# Patient Record
Sex: Female | Born: 1965
Health system: Southern US, Community
[De-identification: ages and names within clinical notes are randomized; demographics above are authoritative.]

## PROBLEM LIST (undated history)

## (undated) DIAGNOSIS — G4733 Obstructive sleep apnea (adult) (pediatric): Secondary | ICD-10-CM

## (undated) DIAGNOSIS — N3010 Interstitial cystitis (chronic) without hematuria: Secondary | ICD-10-CM

## (undated) DIAGNOSIS — N83201 Unspecified ovarian cyst, right side: Secondary | ICD-10-CM

## (undated) DIAGNOSIS — D649 Anemia, unspecified: Secondary | ICD-10-CM

## (undated) DIAGNOSIS — R030 Elevated blood-pressure reading, without diagnosis of hypertension: Secondary | ICD-10-CM

## (undated) DIAGNOSIS — N809 Endometriosis, unspecified: Secondary | ICD-10-CM

## (undated) DIAGNOSIS — E079 Disorder of thyroid, unspecified: Secondary | ICD-10-CM

## (undated) DIAGNOSIS — N920 Excessive and frequent menstruation with regular cycle: Secondary | ICD-10-CM

## (undated) DIAGNOSIS — D219 Benign neoplasm of connective and other soft tissue, unspecified: Secondary | ICD-10-CM

## (undated) DIAGNOSIS — E0500 Thyrotoxicosis with diffuse goiter without thyrotoxic crisis or storm: Secondary | ICD-10-CM

## (undated) DIAGNOSIS — Z634 Disappearance and death of family member: Secondary | ICD-10-CM

## (undated) DIAGNOSIS — F32A Depression, unspecified: Secondary | ICD-10-CM

## (undated) DIAGNOSIS — F329 Major depressive disorder, single episode, unspecified: Secondary | ICD-10-CM

## (undated) DIAGNOSIS — N939 Abnormal uterine and vaginal bleeding, unspecified: Secondary | ICD-10-CM

## (undated) DIAGNOSIS — F4321 Adjustment disorder with depressed mood: Secondary | ICD-10-CM

## (undated) DIAGNOSIS — E039 Hypothyroidism, unspecified: Secondary | ICD-10-CM

## (undated) DIAGNOSIS — D259 Leiomyoma of uterus, unspecified: Secondary | ICD-10-CM

## (undated) DIAGNOSIS — K219 Gastro-esophageal reflux disease without esophagitis: Secondary | ICD-10-CM

## (undated) DIAGNOSIS — E119 Type 2 diabetes mellitus without complications: Secondary | ICD-10-CM

## (undated) DIAGNOSIS — B370 Candidal stomatitis: Secondary | ICD-10-CM

## (undated) DIAGNOSIS — I493 Ventricular premature depolarization: Secondary | ICD-10-CM

## (undated) DIAGNOSIS — Z8632 Personal history of gestational diabetes: Secondary | ICD-10-CM

## (undated) DIAGNOSIS — I10 Essential (primary) hypertension: Secondary | ICD-10-CM

## (undated) HISTORY — PX: HERNIA REPAIR: SHX51

## (undated) HISTORY — DX: Thyrotoxicosis with diffuse goiter without thyrotoxic crisis or storm: E05.00

## (undated) HISTORY — DX: Excessive and frequent menstruation with regular cycle: N92.0

## (undated) HISTORY — DX: Elevated blood-pressure reading, without diagnosis of hypertension: R03.0

## (undated) HISTORY — DX: Leiomyoma of uterus, unspecified: D25.9

## (undated) HISTORY — DX: Endometriosis, unspecified: N80.9

## (undated) HISTORY — DX: Unspecified ovarian cyst, right side: N83.201

## (undated) HISTORY — PX: CHOLECYSTECTOMY: SHX55

## (undated) HISTORY — DX: Interstitial cystitis (chronic) without hematuria: N30.10

## (undated) HISTORY — DX: Hypothyroidism, unspecified: E03.9

## (undated) HISTORY — DX: Ventricular premature depolarization: I49.3

## (undated) HISTORY — DX: Abnormal uterine and vaginal bleeding, unspecified: N93.9

## (undated) HISTORY — DX: Personal history of gestational diabetes: Z86.32

## (undated) HISTORY — PX: TUMOR REMOVAL: SHX12

## (undated) HISTORY — DX: Adjustment disorder with depressed mood: Z63.4

## (undated) HISTORY — DX: Obstructive sleep apnea (adult) (pediatric): G47.33

## (undated) HISTORY — PX: APPENDECTOMY: SHX54

## (undated) HISTORY — DX: Adjustment disorder with depressed mood: F43.21

## (undated) HISTORY — DX: Candidal stomatitis: B37.0

---

## 1997-11-03 ENCOUNTER — Other Ambulatory Visit: Admission: RE | Admit: 1997-11-03 | Discharge: 1997-11-03 | Payer: Self-pay | Admitting: Obstetrics and Gynecology

## 1998-06-02 ENCOUNTER — Inpatient Hospital Stay (HOSPITAL_COMMUNITY): Admission: AD | Admit: 1998-06-02 | Discharge: 1998-06-04 | Payer: Self-pay | Admitting: Obstetrics and Gynecology

## 1998-07-08 ENCOUNTER — Emergency Department (HOSPITAL_COMMUNITY): Admission: EM | Admit: 1998-07-08 | Discharge: 1998-07-08 | Payer: Self-pay | Admitting: Emergency Medicine

## 1998-07-24 ENCOUNTER — Inpatient Hospital Stay (HOSPITAL_COMMUNITY): Admission: AD | Admit: 1998-07-24 | Discharge: 1998-07-24 | Payer: Self-pay | Admitting: Obstetrics and Gynecology

## 1998-09-01 ENCOUNTER — Encounter: Payer: Self-pay | Admitting: Emergency Medicine

## 1998-09-01 ENCOUNTER — Emergency Department (HOSPITAL_COMMUNITY): Admission: EM | Admit: 1998-09-01 | Discharge: 1998-09-01 | Payer: Self-pay | Admitting: Emergency Medicine

## 1998-10-25 ENCOUNTER — Inpatient Hospital Stay (HOSPITAL_COMMUNITY): Admission: RE | Admit: 1998-10-25 | Discharge: 1998-10-27 | Payer: Self-pay

## 1999-02-06 ENCOUNTER — Ambulatory Visit (HOSPITAL_COMMUNITY): Admission: RE | Admit: 1999-02-06 | Discharge: 1999-02-06 | Payer: Self-pay

## 1999-06-09 ENCOUNTER — Encounter: Payer: Self-pay | Admitting: Obstetrics and Gynecology

## 1999-06-09 ENCOUNTER — Ambulatory Visit (HOSPITAL_COMMUNITY): Admission: RE | Admit: 1999-06-09 | Discharge: 1999-06-09 | Payer: Self-pay | Admitting: Obstetrics and Gynecology

## 1999-06-12 ENCOUNTER — Encounter: Admission: RE | Admit: 1999-06-12 | Discharge: 1999-09-10 | Payer: Self-pay | Admitting: Obstetrics and Gynecology

## 1999-08-01 ENCOUNTER — Ambulatory Visit (HOSPITAL_COMMUNITY): Admission: RE | Admit: 1999-08-01 | Discharge: 1999-08-01 | Payer: Self-pay | Admitting: Obstetrics and Gynecology

## 1999-08-01 ENCOUNTER — Encounter: Payer: Self-pay | Admitting: Obstetrics and Gynecology

## 1999-08-17 ENCOUNTER — Inpatient Hospital Stay (HOSPITAL_COMMUNITY): Admission: AD | Admit: 1999-08-17 | Discharge: 1999-08-17 | Payer: Self-pay | Admitting: Obstetrics and Gynecology

## 1999-10-13 ENCOUNTER — Inpatient Hospital Stay (HOSPITAL_COMMUNITY): Admission: AD | Admit: 1999-10-13 | Discharge: 1999-10-13 | Payer: Self-pay | Admitting: Obstetrics and Gynecology

## 1999-10-19 ENCOUNTER — Inpatient Hospital Stay (HOSPITAL_COMMUNITY): Admission: AD | Admit: 1999-10-19 | Discharge: 1999-10-21 | Payer: Self-pay | Admitting: Obstetrics and Gynecology

## 1999-10-27 ENCOUNTER — Inpatient Hospital Stay (HOSPITAL_COMMUNITY): Admission: AD | Admit: 1999-10-27 | Discharge: 1999-10-27 | Payer: Self-pay | Admitting: *Deleted

## 2000-04-18 ENCOUNTER — Emergency Department (HOSPITAL_COMMUNITY): Admission: EM | Admit: 2000-04-18 | Discharge: 2000-04-19 | Payer: Self-pay

## 2000-04-19 ENCOUNTER — Encounter: Payer: Self-pay | Admitting: Emergency Medicine

## 2000-06-27 ENCOUNTER — Other Ambulatory Visit: Admission: RE | Admit: 2000-06-27 | Discharge: 2000-06-27 | Payer: Self-pay | Admitting: Obstetrics and Gynecology

## 2000-07-26 ENCOUNTER — Ambulatory Visit (HOSPITAL_COMMUNITY): Admission: RE | Admit: 2000-07-26 | Discharge: 2000-07-26 | Payer: Self-pay | Admitting: Obstetrics and Gynecology

## 2000-07-26 ENCOUNTER — Encounter: Payer: Self-pay | Admitting: Obstetrics and Gynecology

## 2000-08-29 ENCOUNTER — Inpatient Hospital Stay (HOSPITAL_COMMUNITY): Admission: AD | Admit: 2000-08-29 | Discharge: 2000-08-29 | Payer: Self-pay | Admitting: Obstetrics and Gynecology

## 2000-08-30 ENCOUNTER — Emergency Department (HOSPITAL_COMMUNITY): Admission: EM | Admit: 2000-08-30 | Discharge: 2000-08-30 | Payer: Self-pay | Admitting: Emergency Medicine

## 2000-09-26 ENCOUNTER — Ambulatory Visit (HOSPITAL_COMMUNITY): Admission: RE | Admit: 2000-09-26 | Discharge: 2000-09-26 | Payer: Self-pay | Admitting: Obstetrics and Gynecology

## 2000-09-26 ENCOUNTER — Encounter: Payer: Self-pay | Admitting: Obstetrics and Gynecology

## 2000-10-24 ENCOUNTER — Ambulatory Visit (HOSPITAL_COMMUNITY): Admission: RE | Admit: 2000-10-24 | Discharge: 2000-10-24 | Payer: Self-pay | Admitting: Obstetrics and Gynecology

## 2000-10-24 ENCOUNTER — Encounter: Payer: Self-pay | Admitting: Obstetrics and Gynecology

## 2000-11-27 ENCOUNTER — Ambulatory Visit (HOSPITAL_COMMUNITY): Admission: RE | Admit: 2000-11-27 | Discharge: 2000-11-27 | Payer: Self-pay | Admitting: Obstetrics and Gynecology

## 2000-11-27 ENCOUNTER — Encounter: Payer: Self-pay | Admitting: Obstetrics and Gynecology

## 2000-12-24 ENCOUNTER — Encounter (INDEPENDENT_AMBULATORY_CARE_PROVIDER_SITE_OTHER): Payer: Self-pay | Admitting: *Deleted

## 2000-12-24 ENCOUNTER — Inpatient Hospital Stay (HOSPITAL_COMMUNITY): Admission: AD | Admit: 2000-12-24 | Discharge: 2000-12-27 | Payer: Self-pay | Admitting: Obstetrics and Gynecology

## 2000-12-28 ENCOUNTER — Encounter: Admission: RE | Admit: 2000-12-28 | Discharge: 2001-01-27 | Payer: Self-pay | Admitting: Obstetrics and Gynecology

## 2000-12-29 ENCOUNTER — Inpatient Hospital Stay (HOSPITAL_COMMUNITY): Admission: AD | Admit: 2000-12-29 | Discharge: 2000-12-29 | Payer: Self-pay | Admitting: Obstetrics and Gynecology

## 2000-12-30 ENCOUNTER — Inpatient Hospital Stay (HOSPITAL_COMMUNITY): Admission: AD | Admit: 2000-12-30 | Discharge: 2000-12-30 | Payer: Self-pay | Admitting: Obstetrics and Gynecology

## 2002-10-12 ENCOUNTER — Encounter (INDEPENDENT_AMBULATORY_CARE_PROVIDER_SITE_OTHER): Payer: Self-pay

## 2002-10-12 ENCOUNTER — Encounter: Payer: Self-pay | Admitting: Emergency Medicine

## 2002-10-12 ENCOUNTER — Inpatient Hospital Stay (HOSPITAL_COMMUNITY): Admission: EM | Admit: 2002-10-12 | Discharge: 2002-10-13 | Payer: Self-pay | Admitting: Emergency Medicine

## 2004-04-29 ENCOUNTER — Emergency Department (HOSPITAL_COMMUNITY): Admission: EM | Admit: 2004-04-29 | Discharge: 2004-04-29 | Payer: Self-pay | Admitting: Emergency Medicine

## 2004-07-03 ENCOUNTER — Other Ambulatory Visit: Admission: RE | Admit: 2004-07-03 | Discharge: 2004-07-03 | Payer: Self-pay | Admitting: Obstetrics and Gynecology

## 2004-11-13 ENCOUNTER — Ambulatory Visit (HOSPITAL_COMMUNITY): Admission: RE | Admit: 2004-11-13 | Discharge: 2004-11-13 | Payer: Self-pay | Admitting: Obstetrics and Gynecology

## 2004-11-30 ENCOUNTER — Ambulatory Visit (HOSPITAL_COMMUNITY): Admission: RE | Admit: 2004-11-30 | Discharge: 2004-11-30 | Payer: Self-pay | Admitting: Obstetrics and Gynecology

## 2005-02-09 ENCOUNTER — Ambulatory Visit (HOSPITAL_COMMUNITY): Admission: RE | Admit: 2005-02-09 | Discharge: 2005-02-09 | Payer: Self-pay | Admitting: Obstetrics and Gynecology

## 2005-04-10 ENCOUNTER — Inpatient Hospital Stay (HOSPITAL_COMMUNITY): Admission: RE | Admit: 2005-04-10 | Discharge: 2005-04-13 | Payer: Self-pay | Admitting: Obstetrics and Gynecology

## 2005-04-24 ENCOUNTER — Inpatient Hospital Stay (HOSPITAL_COMMUNITY): Admission: AD | Admit: 2005-04-24 | Discharge: 2005-04-24 | Payer: Self-pay | Admitting: Obstetrics and Gynecology

## 2005-06-16 ENCOUNTER — Emergency Department (HOSPITAL_COMMUNITY): Admission: EM | Admit: 2005-06-16 | Discharge: 2005-06-16 | Payer: Self-pay | Admitting: Emergency Medicine

## 2005-07-25 ENCOUNTER — Emergency Department (HOSPITAL_COMMUNITY): Admission: EM | Admit: 2005-07-25 | Discharge: 2005-07-25 | Payer: Self-pay | Admitting: Emergency Medicine

## 2005-08-01 ENCOUNTER — Other Ambulatory Visit: Admission: RE | Admit: 2005-08-01 | Discharge: 2005-08-01 | Payer: Self-pay | Admitting: Obstetrics and Gynecology

## 2005-12-02 ENCOUNTER — Emergency Department (HOSPITAL_COMMUNITY): Admission: EM | Admit: 2005-12-02 | Discharge: 2005-12-02 | Payer: Self-pay | Admitting: Emergency Medicine

## 2006-05-28 ENCOUNTER — Inpatient Hospital Stay (HOSPITAL_COMMUNITY): Admission: AD | Admit: 2006-05-28 | Discharge: 2006-05-28 | Payer: Self-pay | Admitting: Obstetrics and Gynecology

## 2006-05-29 ENCOUNTER — Ambulatory Visit (HOSPITAL_COMMUNITY): Admission: RE | Admit: 2006-05-29 | Discharge: 2006-05-29 | Payer: Self-pay | Admitting: Obstetrics and Gynecology

## 2006-06-04 ENCOUNTER — Ambulatory Visit (HOSPITAL_COMMUNITY): Admission: RE | Admit: 2006-06-04 | Discharge: 2006-06-04 | Payer: Self-pay | Admitting: Obstetrics and Gynecology

## 2006-06-14 ENCOUNTER — Ambulatory Visit (HOSPITAL_COMMUNITY): Admission: RE | Admit: 2006-06-14 | Discharge: 2006-06-14 | Payer: Self-pay | Admitting: Obstetrics and Gynecology

## 2006-06-28 ENCOUNTER — Ambulatory Visit (HOSPITAL_COMMUNITY): Admission: RE | Admit: 2006-06-28 | Discharge: 2006-06-28 | Payer: Self-pay | Admitting: Obstetrics and Gynecology

## 2006-07-03 ENCOUNTER — Ambulatory Visit (HOSPITAL_COMMUNITY): Admission: RE | Admit: 2006-07-03 | Discharge: 2006-07-03 | Payer: Self-pay | Admitting: Obstetrics and Gynecology

## 2006-07-12 ENCOUNTER — Ambulatory Visit (HOSPITAL_COMMUNITY): Admission: RE | Admit: 2006-07-12 | Discharge: 2006-07-12 | Payer: Self-pay | Admitting: Obstetrics and Gynecology

## 2006-07-22 ENCOUNTER — Inpatient Hospital Stay (HOSPITAL_COMMUNITY): Admission: AD | Admit: 2006-07-22 | Discharge: 2006-07-26 | Payer: Self-pay | Admitting: Obstetrics and Gynecology

## 2006-07-22 ENCOUNTER — Encounter (INDEPENDENT_AMBULATORY_CARE_PROVIDER_SITE_OTHER): Payer: Self-pay | Admitting: *Deleted

## 2006-10-02 ENCOUNTER — Encounter (INDEPENDENT_AMBULATORY_CARE_PROVIDER_SITE_OTHER): Payer: Self-pay | Admitting: Nurse Practitioner

## 2006-10-02 LAB — CONVERTED CEMR LAB
ALT: 38 units/L
AST: 18 units/L
Albumin: 4.1 g/dL
Alkaline Phosphatase: 139 units/L
BUN: 13 mg/dL
CO2: 23 meq/L
Calcium: 9.5 mg/dL
Chloride: 106 meq/L
Creatinine, Ser: 0.72 mg/dL
Glucose, Bld: 86 mg/dL
Potassium: 4.5 meq/L
Sodium: 140 meq/L
Total Protein: 7.7 g/dL

## 2006-11-02 ENCOUNTER — Emergency Department (HOSPITAL_COMMUNITY): Admission: EM | Admit: 2006-11-02 | Discharge: 2006-11-02 | Payer: Self-pay | Admitting: Emergency Medicine

## 2007-08-07 ENCOUNTER — Ambulatory Visit: Payer: Self-pay | Admitting: Nurse Practitioner

## 2007-08-07 DIAGNOSIS — M5137 Other intervertebral disc degeneration, lumbosacral region: Secondary | ICD-10-CM | POA: Insufficient documentation

## 2007-08-07 DIAGNOSIS — I10 Essential (primary) hypertension: Secondary | ICD-10-CM | POA: Insufficient documentation

## 2007-08-07 DIAGNOSIS — J309 Allergic rhinitis, unspecified: Secondary | ICD-10-CM | POA: Insufficient documentation

## 2007-08-08 ENCOUNTER — Ambulatory Visit: Payer: Self-pay | Admitting: *Deleted

## 2007-08-12 ENCOUNTER — Encounter (INDEPENDENT_AMBULATORY_CARE_PROVIDER_SITE_OTHER): Payer: Self-pay | Admitting: Nurse Practitioner

## 2007-09-01 ENCOUNTER — Ambulatory Visit: Payer: Self-pay | Admitting: Nurse Practitioner

## 2007-09-01 DIAGNOSIS — N3010 Interstitial cystitis (chronic) without hematuria: Secondary | ICD-10-CM | POA: Insufficient documentation

## 2007-09-02 DIAGNOSIS — E890 Postprocedural hypothyroidism: Secondary | ICD-10-CM | POA: Insufficient documentation

## 2007-09-02 LAB — CONVERTED CEMR LAB
ALT: 21 units/L (ref 0–35)
AST: 18 units/L (ref 0–37)
Albumin: 3.8 g/dL (ref 3.5–5.2)
Alkaline Phosphatase: 168 units/L — ABNORMAL HIGH (ref 39–117)
BUN: 21 mg/dL (ref 6–23)
Basophils Absolute: 0 10*3/uL (ref 0.0–0.1)
Basophils Relative: 0 % (ref 0–1)
CO2: 23 meq/L (ref 19–32)
Calcium: 9.2 mg/dL (ref 8.4–10.5)
Chloride: 106 meq/L (ref 96–112)
Cholesterol: 149 mg/dL (ref 0–200)
Creatinine, Ser: 0.58 mg/dL (ref 0.40–1.20)
Eosinophils Absolute: 0.1 10*3/uL (ref 0.0–0.7)
Eosinophils Relative: 1 % (ref 0–5)
Glucose, Bld: 96 mg/dL (ref 70–99)
HCT: 34.8 % — ABNORMAL LOW (ref 36.0–46.0)
HDL: 44 mg/dL (ref >39)
Hemoglobin: 10.4 g/dL — ABNORMAL LOW (ref 12.0–15.0)
LDL Cholesterol: 89 mg/dL (ref 0–99)
Lymphocytes Relative: 39 % (ref 12–46)
Lymphs Abs: 3 10*3/uL (ref 0.7–4.0)
MCHC: 29.9 g/dL — ABNORMAL LOW (ref 30.0–36.0)
MCV: 78.6 fL (ref 78.0–100.0)
Monocytes Absolute: 0.5 10*3/uL (ref 0.1–1.0)
Monocytes Relative: 7 % (ref 3–12)
Neutro Abs: 4 10*3/uL (ref 1.7–7.7)
Neutrophils Relative %: 52 % (ref 43–77)
Platelets: 342 10*3/uL (ref 150–400)
Potassium: 4 meq/L (ref 3.5–5.3)
RBC: 4.43 M/uL (ref 3.87–5.11)
RDW: 14.8 % (ref 11.5–15.5)
Sodium: 144 meq/L (ref 135–145)
TSH: <0.004 microintl units/mL — ABNORMAL LOW (ref 0.350–5.50)
Total Bilirubin: 0.3 mg/dL (ref 0.3–1.2)
Total CHOL/HDL Ratio: 3.4
Total Protein: 6.9 g/dL (ref 6.0–8.3)
Triglycerides: 78 mg/dL (ref <150)
VLDL: 16 mg/dL (ref 0–40)
WBC: 7.7 10*3/uL (ref 4.0–10.5)

## 2007-09-03 ENCOUNTER — Ambulatory Visit: Payer: Self-pay | Admitting: Nurse Practitioner

## 2007-09-03 DIAGNOSIS — D509 Iron deficiency anemia, unspecified: Secondary | ICD-10-CM | POA: Insufficient documentation

## 2007-09-03 DIAGNOSIS — D649 Anemia, unspecified: Secondary | ICD-10-CM

## 2007-09-03 LAB — CONVERTED CEMR LAB
Bilirubin Urine: NEGATIVE
Blood in Urine, dipstick: NEGATIVE
Chlamydia, DNA Probe: NEGATIVE
GC Probe Amp, Genital: NEGATIVE
Glucose, Urine, Semiquant: NEGATIVE
KOH Prep: NEGATIVE
Nitrite: NEGATIVE
Specific Gravity, Urine: >=1.03
Urobilinogen, UA: 1
WBC Urine, dipstick: NEGATIVE
pH: 5.5

## 2007-09-04 ENCOUNTER — Encounter (INDEPENDENT_AMBULATORY_CARE_PROVIDER_SITE_OTHER): Payer: Self-pay | Admitting: Nurse Practitioner

## 2007-09-04 LAB — CONVERTED CEMR LAB: Pap Smear: NEGATIVE

## 2007-09-05 LAB — CONVERTED CEMR LAB
Free T4: 2.23 ng/dL — ABNORMAL HIGH (ref 0.89–1.80)
T3, Free: 6.7 pg/mL — ABNORMAL HIGH (ref 2.3–4.2)

## 2008-06-16 ENCOUNTER — Emergency Department (HOSPITAL_COMMUNITY): Admission: EM | Admit: 2008-06-16 | Discharge: 2008-06-16 | Payer: Self-pay | Admitting: Emergency Medicine

## 2008-08-11 ENCOUNTER — Encounter (HOSPITAL_COMMUNITY): Admission: RE | Admit: 2008-08-11 | Discharge: 2008-11-09 | Payer: Self-pay | Admitting: Endocrinology

## 2008-08-17 ENCOUNTER — Emergency Department (HOSPITAL_COMMUNITY): Admission: EM | Admit: 2008-08-17 | Discharge: 2008-08-17 | Payer: Self-pay | Admitting: Emergency Medicine

## 2008-09-10 ENCOUNTER — Ambulatory Visit (HOSPITAL_COMMUNITY): Admission: RE | Admit: 2008-09-10 | Discharge: 2008-09-10 | Payer: Self-pay | Admitting: Endocrinology

## 2009-02-05 ENCOUNTER — Emergency Department (HOSPITAL_COMMUNITY): Admission: EM | Admit: 2009-02-05 | Discharge: 2009-02-05 | Payer: Self-pay | Admitting: Emergency Medicine

## 2009-06-21 ENCOUNTER — Emergency Department (HOSPITAL_COMMUNITY): Admission: EM | Admit: 2009-06-21 | Discharge: 2009-06-21 | Payer: Self-pay | Admitting: Emergency Medicine

## 2010-07-16 ENCOUNTER — Encounter: Payer: Self-pay | Admitting: Family Medicine

## 2010-07-16 ENCOUNTER — Encounter: Payer: Self-pay | Admitting: Endocrinology

## 2010-07-16 ENCOUNTER — Encounter: Payer: Self-pay | Admitting: Obstetrics and Gynecology

## 2010-07-25 NOTE — Miscellaneous (Signed)
Summary: Hx added  Clinical Lists Changes  Problems: Added new problem of INTERSTITIAL CYSTITIS (ICD-595.1) Observations: Added new observation of CALCIUM: 9.5 mg/dL (16/03/9603 5:40) Added new observation of ALBUMIN: 4.1 g/dL (98/04/9146 8:29) Added new observation of PROTEIN, TOT: 7.7 g/dL (56/21/3086 5:78) Added new observation of SGPT (ALT): 38 units/L (10/02/2006 8:52) Added new observation of SGOT (AST): 18 units/L (10/02/2006 8:52) Added new observation of ALK PHOS: 139 units/L (10/02/2006 8:52) Added new observation of CREATININE: 0.72 mg/dL (46/96/2952 8:41) Added new observation of BUN: 13 mg/dL (32/44/0102 7:25) Added new observation of BG RANDOM: 86 mg/dL (36/64/4034 7:42) Added new observation of CO2 PLSM/SER: 23 meq/L (10/02/2006 8:52) Added new observation of CL SERUM: 106 meq/L (10/02/2006 8:52) Added new observation of K SERUM: 4.5 meq/L (10/02/2006 8:52) Added new observation of NA: 140 meq/L (10/02/2006 8:52)

## 2010-07-25 NOTE — Letter (Signed)
Summary: RECORDS REQUESTED FROM [facility] 08/12/07  RECORDS REQUESTED FROM [facility] 08/12/07   Imported By: Silvio Pate Stanis[place]cyk 08/12/2007 13:22:43  _____________________________________________________________________  External Attachment:    Type:   Image     Comment:   External Document

## 2010-07-25 NOTE — Assessment & Plan Note (Signed)
Summary: Complete Physical Exam   Vital Signs:  Patient Profile:   45 Years Old Female Height:     65 inches Weight:      318 pounds BMI:     53.11 BSA:     2.41 Temp:     97.8 degrees F oral Pulse rate:   96 / minute Pulse rhythm:   regular Resp:     20 per minute BP sitting:   120 / 80  (left arm) Cuff size:   large  Pt. in pain?   no  Vitals Entered By: Levon Hedger (September 03, 2007 3:21 PM)              Is Patient Diabetic? No  Does patient need assistance? Ambulation Normal     Chief Complaint:  CPP.  History of Present Illness:  Complete Physical Exam  PAP - last pap normal done before last pregnancy.  Regular menses.  last 3-5 days.  minimal cramping.  but back pain gets worse prior to onset of menses. 8 children, youngest 1 year. s/p tubal ligation  mammogram - no mammogram.  Mother does have a history of breast cancer. no family history of ovarian, or cervical cancer.  optho - recent eval 2 weeks ago. She just recieved new glasses for far sightness.  no tobacco or ETOH.  dental - none in recent months. She reports that she is aware that she needs dental care.  tetanus - done in 2006. s/p nail injury to foot.  Back pain at baseline.  Currently she is also having some muscle spasms.    Prior Medication List:  HYDROCHLOROTHIAZIDE 25 MG TABS (HYDROCHLOROTHIAZIDE) Take one (1) by mouth daily   Current Allergies: No known allergies     Risk Factors: Tobacco use:  never Drug use:  no HIV high-risk behavior:  no Caffeine use:  1 drinks per day Alcohol use:  no Exercise:  no Seatbelt use:  75 % Sun Exposure:  occasionally  Family History Risk Factors:    Family History of MI in females < 61 years old:  no    Family History of MI in males < 18 years old:  no   Review of Systems  General      Denies fever.      hot flashes  Eyes      Denies blurring.  ENT      Denies earache and nasal congestion.  CV      Complains of  palpitations.      Denies chest pain or discomfort and fatigue.  Resp      Denies cough.  GI      Denies abdominal pain, nausea, and vomiting.  GU      Denies abnormal vaginal bleeding.  MS      Complains of low back pain.  Derm      toe nail fungus  Neuro      Denies headaches.  Psych      Denies depression.   Physical Exam  General:     alert, well-developed, and overweight-appearing.   Head:     normocephalic.  short hair Eyes:     EOM intact pupils round and no exophthalmoses.   Ears:     R ear normal and L ear normal.  moderate cerumen Nose:     External nasal examination shows no deformity or inflammation. Nasal mucosa are pink and moist without lesions or exudates. Mouth:     fair dentition.   Neck:  slight thyromegaly.   Chest Wall:     no deformities.   Breasts:     large, pendulous, dense tissue no masses  no nipple discharge.   Lungs:     normal respiratory effort, no intercostal retractions, no accessory muscle use, and normal breath sounds.   Heart:     normal rate, regular rhythm, no murmur, and no gallop.   Abdomen:     obese soft, non-tender, and normal bowel sounds.   a/p appendectomy s/p cholecystectomy Rectal:     no external abnormalities and no hemorrhoids.  guaiac negative Msk:     normal ROM.   bil great toes with brittle toenails. discolored Pulses:     R radial normal, R dorsalis pedis normal, L radial normal, and L dorsalis pedis normal.   Extremities:     no edema Neurologic:     alert & oriented X3, cranial nerves II-XII intact, strength normal in all extremities, and gait normal.   Skin:     color normal.  nevi to face left posterior thigh with pendulous skin tag - flesh colored approx 1 cm  Cervical Nodes:     no anterior cervical adenopathy and no posterior cervical adenopathy.   Psych:     Oriented X3, normally interactive, and good eye contact.    Pelvic Exam  Vulva:      normal appearance.   Urethra  and Bladder:      Urethra--normal.   Vagina:      normal, ruggated, physiologic discharge.   Cervix:      midposition, no CMT, parous.   Uterus:      smooth.   Adnexa:      no masses bilaterally.   Rectum:      normal, heme negative stool.       Impression & Recommendations:  Problem # 1:  HEALTH MAINTENANCE EXAM (ICD-V70.0) Labs reviewed as done earlier this week mammgram to be scheduled. self breast exam placcard given tetanus up to date no indication for colonscopy maintain routine dental and optho eval Orders: Mammogram (Screening) (Mammo) Hemoccult Guaiac-1 spec.(in office) (19147) UA Dipstick w/o Micro (manual) (82956) Pap Smear, Thin Prep ( Collection of) (O1308) Thin Prep Pap (65784) KOH/ WET Mount [id number]) Wet Prep [phone])   Problem # 2:  HYPERTHYROIDISM (ICD-242.90) as indicated by labs.  will get ultrasound.  will need to start meds. Orders: Ultrasound (Ultrasound)   Complete Medication List: 1)  Hydrochlorothiazide 25 Mg Tabs (Hydrochlorothiazide) .... Take one (1) by mouth daily 2)  Multivitamins Tabs (Multiple vitamin) .[name].. 1 tablet by mouth daily   Patient Instructions: 1)  Get mammogram as ordered 2)  Get thyroid ultrasound as ordered.  Depending on results you may need iodine uptake scan 3)  Follow up in 2 weeks for test results.    Prescriptions: MULTIVITAMINS   TABS (MULTIPLE VITAMIN) 1 tablet by mouth daily  #30 x 5   Entered and Authorized by:   Lehman Prom FNP   Signed by:   Lehman Prom FNP on 09/03/2007   Method used:   Print then Give to Patient   RxID:   [id number]  ] Laboratory Results   Urine Tests    Routine Urinalysis   Color: yellow Appearance: Hazy Glucose: negative   (Normal Range: Negative) Bilirubin: negative   (Normal Range: Negative) Ketone: trace (5)   (Normal Range: Negative) Spec. Gravity: >=1.030   (Normal Range: 1.003-1.035) Blood: negative   (Normal Range: Negative) pH: 5.5   (Normal  Range: 5.0-8.0) Protein: trace   (Normal Range: Negative) Urobilinogen: 1.0   (Normal Range: 0-1) Nitrite: negative   (Normal Range: Negative) Leukocyte Esterace: negative   (Normal Range: Negative)    Date/Time Received: September 04, 2007 8:37 AM   Wet Mount/KOH Source: vaginal WBC/hpf: 1-5 Bacteria/hpf: rare Clue cells/hpf: none Yeast/hpf: none Trichomonas/hpf: none  Stool - Occult Blood Hemmoccult #1: negative Date: 09/03/2007   Laboratory Results   Urine Tests    Routine Urinalysis   Color: yellow Appearance: Hazy Glucose: negative   (Normal Range: Negative) Bilirubin: negative   (Normal Range: Negative) Ketone: trace (5)   (Normal Range: Negative) Spec. Gravity: >=1.030   (Normal Range: 1.003-1.035) Blood: negative   (Normal Range: Negative) pH: 5.5   (Normal Range: 5.0-8.0) Protein: trace   (Normal Range: Negative) Urobilinogen: 1.0   (Normal Range: 0-1) Nitrite: negative   (Normal Range: Negative) Leukocyte Esterace: negative   (Normal Range: Negative)      Wet Mount/KOH KOH Negative

## 2010-07-25 NOTE — Assessment & Plan Note (Signed)
Summary: NEW - HTN   Vital Signs:  Patient Profile:   45 Years Old Female Height:     65 inches Weight:      318.75 pounds BMI:     53.23 BSA:     2.41 Temp:     97.1 degrees F oral Pulse rate:   90 / minute Pulse rhythm:   regular Resp:     20 per minute BP sitting:   110 / 80  (left arm) Cuff size:   large  Vitals Entered ByVerdis Prime (August 07, 2007 3:07 PM)             Is Patient Diabetic? No  Does patient need assistance? Functional Status Self care Ambulation Normal Comments Left breast real sharp tingling aching pain, startes at the top of the breast and shots down through the nipple.  Noticed pain for about 2 years, was tolerable now more noticable and concerning.  Use to be on BP medication, from 2002-2003 has been off ever since with few complications. Pt also request dental referral.  Pt has a scheduled eye appointment on February 20th with Dr. Shea Evans with the [facility] [place] [facility].  Pt also has an injury to her left ear back in 2005 where her ear drum was punctured  she never had fixed, and she can tell she is lossing hearing in that ear.     Chief Complaint:  new pt, establish care, and BP check.  History of Present Illness:  Pt into the office to establish care. She was previously seen at an office here in town.  CPE - Last done in 08/2006 by Dr. Penni Bombard.  1 abnormal PAP during the time when she had breast.   HTN - Pt was on low dose blood pressure med from 2002-2003.  She remembers that it was HCTZ..  notes that she checked it at [facility] and BP was 180/96.  She has not been on BP meds since 2003. See ROS  Pt admits that she does eat lots of salt and garlic salt.  Low back pain - notes that she does have soe low back pain.  she takes ibuprofen and rests and her pain improves.   She notes that now that knees are starting to bother her as well.     Past Surgical History:    Caesarean section 1997, 2001, 2002, 2006, 2008    Cholecystectomy 2000  fibroids 2000    Appendectomy 2003    hernia repair 2000    Tubal ligation 2008   Family History:    Family History Hypertension - Father        Family History Breast cancer 1st degree relative <50 -mother         Family History of Colon CA 1st degree relative <60 - father        Family History Diabetes 1st degree relative - father  Social History:    Children 9 living children (2 sets of twins)    Never Smoked    Alcohol use-no    Drug use-no   Risk Factors:  Tobacco use:  never Drug use:  no HIV high-risk behavior:  no Caffeine use:  1 drinks per day Alcohol use:  no Exercise:  no Seatbelt use:  75 % Sun Exposure:  occasionally  Family History Risk Factors:    Family History of MI in females < 74 years old:  no    Family History of MI in males < 51 years old:  no  Review of Systems  General      Denies chills, fatigue, and fever.  Eyes      Complains of blurring.  CV      Complains of palpitations and shortness of breath with exertion.  Resp      Complains of shortness of breath.      Denies cough.  GI      Complains of nausea.      Denies diarrhea and vomiting.  Neuro      Complains of headaches.   Physical Exam  General:     alert.   Head:     normocephalic.   Eyes:     pupils round.   Ears:     R ear normal and L ear normal.   Nose:     no external deformity.   Mouth:     fair dentition.   Lungs:     normal respiratory effort, no intercostal retractions, no accessory muscle use, and normal breath sounds.   Heart:     normal rate, regular rhythm, no murmur, and no gallop.   Abdomen:     soft, non-tender, and normal bowel sounds.   Msk:     up to exam table Neurologic:     alert & oriented X3.   Skin:     color normal.   Psych:     Oriented X3.      Impression & Recommendations:  Problem # 1:  HYPERTENSION, BENIGN ESSENTIAL (ICD-401.1) Will restart BP meds. Advised to lower sodium. Her updated medication list for this  problem includes:    Hydrochlorothiazide 25 Mg Tabs (Hydrochlorothiazide) .[name]... Take one (1) by mouth daily   Problem # 2:  ALLERGIC RHINITIS (ICD-477.9) No current meds  Complete Medication List: 1)  Hydrochlorothiazide 25 Mg Tabs (Hydrochlorothiazide) .... Take one (1) by mouth daily   Patient Instructions: 1)  Follow up in 2 weeks for fasting labs (cbc, cmp, tsh, hiv, RPR) V70.0 - Do not eat before this appointment.  You may drink water 2)  Folllow up in 3 weeks for complete Physical. you will get PAP, mammogram schedule, u/a, EKG 3)  Sign release to get records from previous provider     Prescriptions: HYDROCHLOROTHIAZIDE 25 MG TABS (HYDROCHLOROTHIAZIDE) Take one (1) by mouth daily  #30 x 3   Entered and Authorized by:   Lehman Prom FNP   Signed by:   Lehman Prom FNP on 08/07/2007   Method used:   Print then Give to Patient   RxID:   [id number]  ]

## 2010-07-25 NOTE — Letter (Signed)
Summary: BEHAVIORAL GUIDELINES/SIGNED  BEHAVIORAL GUIDELINES/SIGNED   Imported By: Arta Bruce 08/08/2007 09:18:20  _____________________________________________________________________  External Attachment:    Type:   Image     Comment:   External Document

## 2010-07-28 NOTE — Letter (Signed)
Summary: PATIENT INFORMATION & HISTORY SHEET  PATIENT INFORMATION & HISTORY SHEET   Imported ByArta Bruce 08/08/2007 09:15:34  _____________________________________________________________________  External Attachment:    Type:   Image     Comment:   External Document

## 2010-09-25 LAB — RAPID STREP SCREEN (MED CTR MEBANE ONLY): Streptococcus, Group A Screen (Direct): NEGATIVE

## 2010-09-30 LAB — CBC
HCT: 33.3 % — ABNORMAL LOW (ref 36.0–46.0)
Hemoglobin: 11.1 g/dL — ABNORMAL LOW (ref 12.0–15.0)
MCHC: 33.2 g/dL (ref 30.0–36.0)
MCV: 82.2 fL (ref 78.0–100.0)
Platelets: 267 10*3/uL (ref 150–400)
RBC: 4.05 MIL/uL (ref 3.87–5.11)
RDW: 14.1 % (ref 11.5–15.5)
WBC: 9.6 10*3/uL (ref 4.0–10.5)

## 2010-09-30 LAB — DIFFERENTIAL
Basophils Absolute: 0.3 10*3/uL — ABNORMAL HIGH (ref 0.0–0.1)
Basophils Relative: 3 % — ABNORMAL HIGH (ref 0–1)
Eosinophils Absolute: 0.1 10*3/uL (ref 0.0–0.7)
Eosinophils Relative: 1 % (ref 0–5)
Lymphocytes Relative: 39 % (ref 12–46)
Lymphs Abs: 3.7 10*3/uL (ref 0.7–4.0)
Monocytes Absolute: 0.4 10*3/uL (ref 0.1–1.0)
Monocytes Relative: 5 % (ref 3–12)
Neutro Abs: 5.1 10*3/uL (ref 1.7–7.7)
Neutrophils Relative %: 53 % (ref 43–77)

## 2010-09-30 LAB — URINALYSIS, ROUTINE W REFLEX MICROSCOPIC
Bilirubin Urine: NEGATIVE
Glucose, UA: NEGATIVE mg/dL
Hgb urine dipstick: NEGATIVE
Ketones, ur: NEGATIVE mg/dL
Nitrite: NEGATIVE
Protein, ur: NEGATIVE mg/dL
Specific Gravity, Urine: 1.023 (ref 1.005–1.030)
Urobilinogen, UA: 1 mg/dL (ref 0.0–1.0)
pH: 6.5 (ref 5.0–8.0)

## 2010-09-30 LAB — BASIC METABOLIC PANEL
BUN: 12 mg/dL (ref 6–23)
CO2: 25 mEq/L (ref 19–32)
Calcium: 8.7 mg/dL (ref 8.4–10.5)
Chloride: 107 mEq/L (ref 96–112)
Creatinine, Ser: 0.54 mg/dL (ref 0.4–1.2)
GFR calc Af Amer: >60 mL/min (ref >60)
GFR calc non Af Amer: >60 mL/min (ref >60)
Glucose, Bld: 103 mg/dL — ABNORMAL HIGH (ref 70–99)
Potassium: 4 mEq/L (ref 3.5–5.1)
Sodium: 138 mEq/L (ref 135–145)

## 2010-09-30 LAB — T4, FREE: Free T4: 0.87 ng/dL (ref 0.80–1.80)

## 2010-09-30 LAB — TSH: TSH: 0.007 u[IU]/mL — ABNORMAL LOW (ref 0.350–4.500)

## 2010-10-05 LAB — HCG, SERUM, QUALITATIVE: Preg, Serum: NEGATIVE

## 2010-10-10 LAB — POCT CARDIAC MARKERS
CKMB, poc: <1 ng/mL — ABNORMAL LOW (ref 1.0–8.0)
CKMB, poc: <1 ng/mL — ABNORMAL LOW (ref 1.0–8.0)
CKMB, poc: <1 ng/mL — ABNORMAL LOW (ref 1.0–8.0)
Myoglobin, poc: 49.8 ng/mL (ref 12–200)
Myoglobin, poc: 56.4 ng/mL (ref 12–200)
Myoglobin, poc: 61.7 ng/mL (ref 12–200)
Troponin i, poc: <0.05 ng/mL (ref 0.00–0.09)
Troponin i, poc: <0.05 ng/mL (ref 0.00–0.09)
Troponin i, poc: <0.05 ng/mL (ref 0.00–0.09)

## 2010-10-10 LAB — CBC
HCT: 30.2 % — ABNORMAL LOW (ref 36.0–46.0)
Hemoglobin: 10 g/dL — ABNORMAL LOW (ref 12.0–15.0)
MCHC: 33.2 g/dL (ref 30.0–36.0)
MCV: 77.2 fL — ABNORMAL LOW (ref 78.0–100.0)
Platelets: 179 10*3/uL (ref 150–400)
RBC: 3.92 MIL/uL (ref 3.87–5.11)
RDW: 13.6 % (ref 11.5–15.5)
WBC: 8 10*3/uL (ref 4.0–10.5)

## 2010-10-10 LAB — POCT I-STAT, CHEM 8
BUN: 17 mg/dL (ref 6–23)
Calcium, Ion: 1.25 mmol/L (ref 1.12–1.32)
Chloride: 109 mEq/L (ref 96–112)
Creatinine, Ser: 0.4 mg/dL (ref 0.4–1.2)
Glucose, Bld: 101 mg/dL — ABNORMAL HIGH (ref 70–99)
HCT: 31 % — ABNORMAL LOW (ref 36.0–46.0)
Hemoglobin: 10.5 g/dL — ABNORMAL LOW (ref 12.0–15.0)
Potassium: 3.5 mEq/L (ref 3.5–5.1)
Sodium: 144 mEq/L (ref 135–145)
TCO2: 24 mmol/L (ref 0–100)

## 2010-10-10 LAB — DIFFERENTIAL
Basophils Absolute: 0 10*3/uL (ref 0.0–0.1)
Basophils Relative: 0 % (ref 0–1)
Eosinophils Absolute: 0 10*3/uL (ref 0.0–0.7)
Eosinophils Relative: 1 % (ref 0–5)
Lymphocytes Relative: 50 % — ABNORMAL HIGH (ref 12–46)
Lymphs Abs: 4 10*3/uL (ref 0.7–4.0)
Monocytes Absolute: 0.7 10*3/uL (ref 0.1–1.0)
Monocytes Relative: 9 % (ref 3–12)
Neutro Abs: 3.2 10*3/uL (ref 1.7–7.7)
Neutrophils Relative %: 40 % — ABNORMAL LOW (ref 43–77)

## 2010-10-10 LAB — D-DIMER, QUANTITATIVE: D-Dimer, Quant: 0.72 ug/mL-FEU — ABNORMAL HIGH (ref 0.00–0.48)

## 2010-11-10 NOTE — Op Note (Signed)
NAME:  Erin [name], Erin [name]                         ACCOUNT NO.:  [id number]   MEDICAL RECORD NO.:  [id number]                   PATIENT TYPE:  INP   LOCATION:  0478                                 FACILITY:  [facility]   PHYSICIAN:  Sandria Bales. Ezzard Standing, M.D.               DATE OF BIRTH:  [birth year]   DATE OF PROCEDURE:  10/12/2002  DATE OF DISCHARGE:                                 OPERATIVE REPORT   PREOPERATIVE DIAGNOSES:  1. Acute appendicitis.  2. Umbilical hernia.   POSTOPERATIVE DIAGNOSES:  1. Acute appendicitis.  2. Approximate 6-7 cm diameter recurrent umbilical hernia.   PROCEDURE:  Laparoscopic appendectomy.   SURGEON:  Sandria Bales. Ezzard Standing, M.D.   FIRST ASSISTANT:  None.   ANESTHESIA:  General endotracheal.   ESTIMATED BLOOD LOSS:  Minimal.   INDICATIONS FOR PROCEDURE:  Ms. Erin [name] is a morbidly obese, 45 year old,  black female, who BMI is approximately 70.  She has had a 2-3 day history of  worsening abdominal pain and leukocytosis and CT scan evidence today of  acute appendicitis.  She also has had a prior repair of an umbilical hernia  but by CT scan evidence, there is evidence this hernia has recurred.   I spoke to the patient and her husband about the findings and the need for  surgery, that I would just take care of the appendix today, that trying to  do major repair of her umbilical hernia will be fraught with difficulty and  the risk of infection and recurrence.  I discussed the indications, the  potential complications of the operation, potential complications included  but not limited to bleeding, infection, having to convert to an open  operation, and bowel injury.  They understand pretty well her surgery and  plans.   OPERATIVE NOTE:  The patient placed in a supine position.  Her left arm is  tucked to her side.  She had a Foley catheter in place, had already been  given Unasyn as an antibiotic.  She underwent a general endotracheal  anesthetic supervised by Quentin Cornwall. Council Mechanic, M.D.  Her abdomen was prepped  with Betadine solution and sterilely draped.  Because of her prior umbilical  surgery and hernia repair, I did not want to have to do an open cut-down, so  actually I used the Veress needle technique to insufflate the belly, so I  inserted a Veress needle in the right upper quadrant because I knew I would  be putting a port there.  I put about 3.1 liters of CO2 after using the  water-drop technique to make sure the Veress needle was in place.  I then  used the OptiView trocar 10 mm to get into the abdominal cavity without  difficulty, though the thickness of her abdominal wall took out almost the  whole length of the catheter.  I then did abdominal exploration with a  single-view 0 degree scope, visualizing  the right and left lobes of the  liver which were unremarkable.  Anterior wall of the stomach unremarkable.  There was omentum stuck up in this hernia, and she had inflammatory mass in  the right lower quadrant consistent with her appendicitis.  I then placed  two additional trocars, a long 12 mm trocar in her left abdomen just off the  midline above the hernia and then a 10 mm trocar below the hernia, a little  bit to the right of midline before I tried to make the trocar come out  through the fascia.  I then started dissection of the appendix.  First, I  actually take down this omentum that was stuck up in the hernia sac.  I used  a harmonic scalpel for this.  I then turned my attention to appendix.  The  appendix was fairly large, about 8-9 cm in length.  It was thickened to  about 2 cm in diameter.  It was stuck under the lateral wall of the cecum.  The base of the appendix actually looked fairly free, so I actually was able  to punch a hole through the mesentery of the appendix.  I divided the base  of the appendix with a #45 white load of the EndoGIA stapler.  I then  dissected down the mesentery of the appendix to get the appendix off  the  lateral wall of the colon.  When I got the entire appendix free, I  reinspected the staple line.  There was no bowel injury that was obvious.  There was no bowel injury to the lateral wall.  I then placed the appendix  in an EndoCatch bag, irrigated the abdomen with about 500 mL of saline.  I  then delivered the appendix through the 12 mm trocar site which I had to  enlarge slightly to get the appendix out.  I then used the Endoclose stitch  for two stitches in this area.  I did not close the 10 mm ports, in thinking  they were small enough they would not cause any major trouble.  Each port  site was inspected.  There was no bleeding in any port site.  The patient  tolerated the procedure well.  They will remove the Foley at the end of the  case.  The skin at each site was closed with some staples and then sterilely  dressed.   The patient tolerated the procedure well.  Sponge, needle, and instrument  counts were correct at the end of the case.                                               Sandria Bales. Ezzard Standing, M.D.    DHN/MEDQ  D:  10/12/2002  T:  10/12/2002  Job:  [id number]   cc:   Maurice March, M.D.  [address] [place]  [place] [address]  Fax: [phone]   Hal Morales, M.D.  [address], [address]  [place]  [place] [address]  Fax: [phone]

## 2010-11-10 NOTE — Op Note (Signed)
Erin [name], Erin [name]               ACCOUNT NO.:  [id number]   MEDICAL RECORD NO.:  [id number]          PATIENT TYPE:  OUT   LOCATION:  ULT                           FACILITY:  [facility]   PHYSICIAN:  Hal Morales, M.D.DATE OF BIRTH:  Nov 17, 1965   DATE OF PROCEDURE:  07/22/2006  DATE OF DISCHARGE:                               OPERATIVE REPORT   PREOPERATIVE DIAGNOSIS:  1. Intrauterine pregnancy at 39-1/2 weeks' gestation, prior cesarean      section x4; desires repeat cesarean section.  2. Polyhydramnios.  3. Morbid obesity.  4. Desire for surgical sterilization.   POSTOPERATIVE DIAGNOSES:  1. Intrauterine pregnancy at 39-1/2 weeks' gestation, prior cesarean      section x4; desires repeat cesarean section.  2. Polyhydramnios.  3. Morbid obesity.  4. Desire for surgical sterilization.  5. Unstable fetal lie.   PROCEDURES:  1. Repeat low transverse cesarean section.  2. Placement of subfascial Jackson-Pratt drain.  3. Placement of subcutaneous Jackson-Pratt drain.  4. Bilateral tubal sterilization.   SURGEON:  Hal Morales, M.D.   FIRST ASSISTANT:  Janine Limbo, M.D.   SECOND ASSISTANT:  Erin Sons, CNM.   ANESTHESIA:  Epidural.   ESTIMATED BLOOD LOSS:  Between [phone] cc..   COMPLICATIONS:  None.   FINDINGS:  The tubes and ovaries were normal for the gravid state.  The  uterus was enlarged with several small myomata.  The patient was  delivered of a female infant, whose name is Chloe, weighing 8 pounds 8  ounces with Apgars of 7 and 9 at one and 5 minutes respectively.  The  placenta contained an eccentrically inserted three-vessel cord.   SPECIMENS TO PATHOLOGY:  Portions of right and left fallopian tube and  placenta.   PROCEDURE:  Prior to taking the patient to the operating room, a  discussion was held concerning the indications for her procedure as well  as the risks involved -- which include but are not limited to:  anesthesia, bleeding,  infection, damage to adjacent organs.  It was also  noted that because the patient had an over-descended uterus from  multiple pregnancies and polyhydramnios, that she was at high risk for  intrapartum and postpartum hemorrhage; and could potentially require  hysterectomy at the time of cesarean section.   The patient was taken to the operating room after appropriate  identification and placed on the operating table.  After placement of an  epidural anesthetic, she was placed in the supine position with a left  lateral tilt.  The panniculus was then taped to the ether bar.  The  abdomen and perineum were prepped with multiple layers of Betadine and a  Foley catheter inserted into the bladder and connected to straight  drainage.  The abdomen was draped in a sterile field.  After assurance  of adequate anesthesia, the site of the previous cesarean section  incision was infiltrated with 20 cc of 0.25% Marcaine.  A transverse  incision was made and the abdomen opened in layers.  The peritoneum was  entered and the bladder blade placed.  The uterus was incised  approximately 2 cm above the uterovesical fold, and that incision taken  laterally on either side bluntly.  The membranes were ruptured, with the  egress of clear fluid.  Intrauterine examination revealed that the  infant was in a back-down transverse lie.  The infant was converted to a  footling breech for delivery.  Once the infant had been delivered, the  nares and pharynx were suctioned and the cord clamped and cut.  The  infant was handed off to the awaiting pediatricians.  The uterus was  incised and the placenta separated from the uterus and removed from the  operative field for cord blood donation.  The cervix was then dilated  with a sponge forceps and the uterine incision closed with a running  interlocking suture of 0 Vicryl.  An imbricating suture of 0 Vicryl was  placed.  Several hemostatic sutures of 0 Vicryl in a  figure-of-eight  fashion were placed for adequate hemostasis.  Copious irrigation was  carried out.   The right fallopian tube was then identified, followed to its fimbriated  end, then grasped at the isthmic portion and elevated.  A suture of 2-0  chromic was placed through the mesosalpinx and tied fore and aft on the  knuckle of tube.  A second ligature was placed proximal to that in the  intervening knuckle of tube excised.  The cut ends were cauterized.  The  fimbriated end was then clamped and removed, tying the free end  with a  suture of 2-0 chromic.  The left fallopian tube was then identified,  followed to its fimbriated end, then grasped at the isthmic portion and  elevated.  A suture of 2-0 chromic was placed through the mesosalpinx  and tied fore and aft on the knuckle of tube.  A second ligature was  placed proximal to that, and the intervening knuckle of tube excised.  Cut ends were cauterized.  Copious irrigation again was carried out and  the abdominal peritoneum closed with a running suture of 2-0 Vicryl.  The rectus fascia was closed with 2 running sutures of 0 Vicryl from  each apex to the midline and tied in the midline.  Prior to closing the  fascia, a subfascial Jackson-Pratt drain was placed and exited through a  left lower quadrant stab wound.  It was sewn in place with a suture of 2-  0 silk.  Once the fascia had been closed, a subcutaneous drain was  placed through a stab wound in the right lower quadrant, and the  subcutaneous tissue made hemostatic with Bovie cautery.  The skin  incision was then closed with a subcuticular suture of 2-0 Vicryl, and a  sterile dressing applied.  Grenades were placed on each of the drain.  The patient was taken from the operating room to the recovery room in  satisfactory condition, having tolerated the procedure well.  Songe and  instrument counts were correct.  SPECIMENS TO PATHOLOGY:  Portions of right and left fallopian  tube and  placenta.      Hal Morales, M.D.  Electronically Signed     VPH/MEDQ  D:  07/22/2006  T:  07/22/2006  Job:  [id number]

## 2010-11-10 NOTE — Discharge Summary (Signed)
Erin [name], Erin [name]               ACCOUNT NO.:  [id number]   MEDICAL RECORD NO.:  [id number]          PATIENT TYPE:  INP   LOCATION:  9114                          FACILITY:  [facility]   PHYSICIAN:  Crist Fat. Rivard, M.D. DATE OF BIRTH:  1965/07/14   DATE OF ADMISSION:  04/10/2005  DATE OF DISCHARGE:  04/13/2005                                 DISCHARGE SUMMARY   ADMISSION DIAGNOSES:  1.  Intrauterine pregnancy at term.  2.  Previous cesarean section x3.   DISCHARGE DIAGNOSES:  1.  Intrauterine pregnancy at term.  2.  Previous cesarean section x3.  3.  Mild elevated blood pressure with no signs of preeclampsia.   PROCEDURES:  1.  Repeat low transverse cesarean section.  2.  Epidural anesthesia.   HOSPITAL COURSE:  Erin [name] is a 45 year old, G6, P3-2-0-5 who was admitted  at 38 weeks and 2 days for repeat cesarean section.  Her history had been  remarkable for previous cesarean section x3, advanced maternal age, history  of uterine fibroids, history of twins x2 with loss of one twin secondary to  gross prematurity, history of hypertension with both twin pregnancies and  elevated BMI.   The patient was taken to the operating room where a repeat transverse  cesarean section was performed by Dr. Pennie Rushing with the assistance of Dr.  Stefano Gaul under epidural anesthesia.  Findings were a viable female by the  name of Georg Ruddle, weight 8 pounds 6 ounces, Apgar's 9 and 9.  Infant was  taken to the full-term nursery and mother was taken to the recovery room in  good condition.  By postop day #1, the patient was doing well, she was up ad  lib.  She was breast-feeding.  Hemoglobin was 10 post delivery.  Predelivery  it was 11.4.  She had a JP drain that was draining a small amount of urine.  Her subcuticular sutures were clean, dry and intact.  She did have some gas  pains on the second postop day.  These resolved with ambulation and warm  fluids.  During the morning of April 13, 2005,  early morning she felt that  she could not void.  She was catheterized for 50 mL of clear, yellow urine.  This did help her pain.  She then subsequently voided spontaneously  approximately 200 mL.  She still felt that she was not emptying completely.  Therefore, she had another spontaneous void of 200 mL at approximately 9  a.m. and then was catheterized for residual of only 30 mL.  She also had  some elevation of her blood pressure on postop day #3 with blood pressures  in the 140s-145/89-96 diastolic.  She also reported some slight shortness of  breath with lying flat.  She was evaluated with O2 saturations which was  98%.  PIH labs were all normal.  Catheterized urine was negative for  protein.  Her JP drain was removed without difficulty.  She received initial  dose of Aldomet 250 mg and this was going to be continued on a b.i.d. basis.  Dr. Estanislado Pandy was the consultant for  all those orders.  Once the labs returned  being normal, her urine catheterization was negative.  She was continuing to  void spontaneously with reasonable comfort noted.  The patient was deemed to  receive full benefit of her hospital stay and discharged home.   CONDITION ON DISCHARGE:  Stable.   SPECIAL INSTRUCTIONS:  Instructions per [facility] handout.  Signs  and symptoms of PIH were also reviewed with the patient.   DISCHARGE MEDICATIONS:  1.  Aldomet 250 mg p.o. b.i.d.  Next dose is due at 8 p.m. tonight.  2.  Motrin 600 mg p.o. q.6h. p.r.n. pain.  3.  Tylox one to two p.o. q.4-6h. p.r.n. pain.  4.  The patient is currently undecided about contraception.   FOLLOW UP:  Discharge followup will occur within the week at [facility].  The patient is to call to make an appointment for Tuesday or  Wednesday of next week for blood pressure reevaluation.  She is to call with  any signs or symptoms of concern.      Renaldo Reel Emilee Hero, C.N.M.      Crist Fat Rivard, M.D.  Electronically  Signed    VLL/MEDQ  D:  04/13/2005  T:  04/13/2005  Job:  [id number]

## 2010-11-10 NOTE — H&P (Signed)
NAME:  Erin [name], Erin [name]                         ACCOUNT NO.:  [id number]   MEDICAL RECORD NO.:  [id number]                   PATIENT TYPE:  EMS   LOCATION:  ED                                   FACILITY:  [facility]   PHYSICIAN:  Sandria Bales. Ezzard Standing, M.D.               DATE OF BIRTH:  1966/06/10   DATE OF ADMISSION:  10/12/2002  DATE OF DISCHARGE:                                HISTORY & PHYSICAL   HISTORY OF PRESENT ILLNESS:  This is a 45 year old black female who is seen  by [facility] and Dr. Maurice March and followed from a GYN  standpoint by Dr. Hal Morales.  She has had a three-day history of  abdominal pain which began on the 17th of April 2004.  At first, she thought  it may just be a GI bug, but it has gotten worse, so she was brought by her  husband to the emergency room, where she underwent a CT scan which has shown  inflammation consistent with acute appendicitis.  Also on the CT scan, there  is a suggestion of an umbilical hernia; I reviewed this with Dr. Thora Lance.   The patient denies any prior history of peptic ulcer disease, liver disease,  pancreatic disease or colon disease.  She has actually had a laparoscopic  cholecystectomy and an open umbilical hernia repair in about 2000.   ALLERGIES:  She is allergic to HYDROCODONE which creates a rash.   MEDICATIONS:  She is on no medicines at this current time.   REVIEW OF SYSTEMS:  NEUROLOGIC:  No history of seizure or loss of  consciousness.  PULMONARY:  No history of pneumonia or tuberculosis.  Does  not smoke cigarettes.  CARDIAC:  She states she has had a history of high  blood pressure on and off during her pregnancies but she is not currently on  any medicine.  She has had no chest pain, angina or cardiac evaluation.  GASTROINTESTINAL:  See history of present illness.  UROLOGIC:  No history of  kidney stones or kidney infections.  GYN:  She is a gravida 7, para 7; she  actually had three  C-sections; she has had two sets of twins.   FAMILY HISTORY:  Her mother did have breast cancer at the age of 79.  She  has never had any mammograms.   SOCIAL HISTORY:  She does not work, lives at home taking care of her  children.  She is accompanied by her husband in the emergency room.   PHYSICAL EXAMINATION:  VITAL SIGNS:  Her temperature is 99.0, blood pressure  132/83, her pulse is 94, respirations 20.  GENERAL:  She is a well-nourished, pleasant black female, alert and  cooperative on physical exam.  HEENT:  Unremarkable.  NECK:  Her neck is supple.  I feel no mass, no thyromegaly.  LUNGS:  Her lungs are clear to  auscultation.  HEART:  Her heart has a regular rate and rhythm.  ABDOMEN:  Her abdomen is obese and really limits the exam.  She is tender  sort of diffusely, but probably more so on the right side than the left  side.  I really cannot feel this umbilical hernia.  PELVIC:  I did not do a pelvic or vaginal exam on her.  NEUROLOGIC:  She has good strength in all four extremities and  neurologically is grossly intact.   LABORATORY DATA:  Her labs reveal a white blood cell count of 17,200 with a  hemoglobin of 12 and hematocrit 37.   Again, I reviewed her CT scan with Dr. Lonia Skinner and it does appear to be  consistent with acute appendicitis and the umbilical hernia.   IMPRESSION:  1. Acute appendicitis.  I discussed with the patient the indications and     potential complications of surgery, potential complications including,     but not limited to, bleeding, infection, open surgery, prolonged     hospitalization because of her size.  2. Umbilical hernia, but I am not sure whether I will be able to repair that     at the same time I am in there.  3. Morbid obesity with a body mass index of approximately 51.  4. History of borderline hypertension during pregnancy.                                               Sandria Bales. Ezzard Standing, M.D.    DHN/MEDQ  D:  10/12/2002   T:  10/12/2002  Job:  [id number]   cc:   [facility]   Maurice March, M.D.  [address] [place]  [place] [address]  Fax: [phone]   Hal Morales, M.D.  [address], [address]  [place]  [place] [address]  Fax: [phone]

## 2010-11-10 NOTE — Discharge Summary (Signed)
[facility]  Patient:    Erin [name], Erin [name]                      MRN: [id number] Adm. Date:  [id number] Disc. Date: [id number] Attending:  Shaune Spittle Dictator:   Miguel Dibble, C.N.M.                           Discharge Summary  DATE OF BIRTH:  [birth year]  ADMITTING DIAGNOSES: 1. Intrauterine pregnancy at term. 2. Previous cesarean section desiring repeat.  DISCHARGE DIAGNOSES: 1. Intrauterine pregnancy at term. 2. Previous cesarean section desiring repeat. 3. Macrosomia delivered viable baby boy weighing 10 pounds 9 ounces, Apgars 7 and    9, shoulder dystocia.  PROCEDURES:  Repeat lower segment transverse cesarean section.  ANESTHESIA:  Spinal.  HOSPITAL COURSE:  On October 19, 1999, at 1400 hours [facility] was admitted for a planned repeat cesarean section.  She delivered a viable baby boy names Silas weighing 10 pounds 9 ounces, Apgars 7 and 9, with an shoulder dystocia.  She recovered satisfactorily and preceded on postoperative day #1 to breast feed, ambulate without difficulty, and tolerate her fluids well.  Her incision was clean , dry and intact.  Fundus was firm.  She had adequate bowel sounds.  Her hemoglobin dropped from 11 to 9.7.  She was asymptomatic.  On postoperative day #2, October 21, 1999, she was breast-feeding satisfactorily,  ambulating with mild discomfort that was consistent with second day postoperative, passing flatus, tolerating her regular diet.  Her vital signs were stable.  The incision had a scant amount of serous exudate. She has a pronounced abdominal flap that covers her incision.  She was taught wound care.  She requested early discharge on postoperative day 2.  The decision was ade to discharge her home and have her follow up in the office on October 23, 1999, for removal of staples and application of steri-strips.  CONDITION ON DISCHARGE:  Discharged home in stable  condition.  DISCHARGE INSTRUCTIONS:  Per [facility] booklet.  DISCHARGE MEDICATIONS: 1. Demerol. 2. Motrin. 3. Prenatal vitamins. 4. Micronor for birth control.  DISCHARGE FOLLOW:  October 23, 1999, for staple removal and in 6 weeks postpartum. DD:  10/21/99 TD:  10/25/99 Job: [id number]
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
## 2010-11-10 NOTE — H&P (Signed)
Erin [name], Erin [name]               ACCOUNT NO.:  [id number]   MEDICAL RECORD NO.:  [id number]          PATIENT TYPE:  INP   LOCATION:  NA                            F[facility]ILITY:  [facility]   PHYSICIAN:  Erin [name], M.D.DATE OF BIRTH:  [birth year]   DATE OF ADMISSION:  04/10/2005  DATE OF DISCHARGE:                                HISTORY & PHYSICAL   HISTORY OF PRESENT ILLNESS:  Erin [name] is a 44 year old gravida 6, para 3-2-  0-5, who is admitted at 38 weeks 2 days gestation for repeat cesarean  section.  The patient's history is significant for previous cesarean section  x3.  The patient's pregnancy was remarkable for:  (1) Advanced maternal age.  (2) Previous cesarean section x3.  (3) History of uterine fibroids.  (4)  History of twins x2.  (5) History of hypertension with twin pregnancy.   PRENATAL LABORATORY DATA:  Initial hemoglobin 10.9, hematocrit 34.4,  platelets 260,000, hepatitis B surface antigen negative, rubella immune, RPR  nonreactive, ABO Rh O positive, antibody screen negative.  HIV is  nonreactive.  Quad screen within normal limits.  Cystic fibrosis, gonorrhea  and Chlamydia cultures declined.  Pap smear within normal limits January of  2006.  Sickle cell trait previously found to be negative.  21-week Glucola  71.  Gonorrhea and Chlamydia cultures at 36 weeks negative.  GBS is  negative.   HISTORY OF PRESENT PREGNANCY:  The patient enters care at [redacted] weeks  gestation.  The patient with ultrasound performed at [facility] at [redacted]  weeks gestation with normal anatomy.  Ultrasound repeated at [redacted] weeks  gestation secondary to limited anatomy on the previous ultrasound.  The  patient also underwent a repeat ultrasound at 29 weeks secondary to size  greater than dates which revealed estimated fetal weight at 75th to 90th  percentile, normal amniotic fluid and placenta, cervical length 5.4 cm.  Pregnancy has been otherwise unremarkable.   PAST OBSTETRICAL HISTORY:  1.   28-Sep-2024twin gestation.  Infant A female 1.8 pounds, 26 to [redacted]      weeks gestation.  Spinal anesthesia.  Emergent cesarean section      secondary to decreased fetal heart rate.  That child is deceased.      09/28/2024infant B, female infant, 3 pounds 14 ounces, 26 to [redacted] weeks      gestation, cesarean section again secondary to fetal stress of twin A.      NICU stay for infant, however, the child is fine. Delivered by Dr.      Pennie [name].  2.  September of 1998, female infant, weight 9 pounds 11 ounces, at 39-1/2      weeks, 12 to 14-hour labor, vaginal birth, epidural anesthesia, no      complications by Erin [name], M.D.  3.  December of 1999 female infant, weight 9 pounds 3 ounces at [redacted] weeks      gestation, 18-hour labor, vaginal birth, epidural anesthesia with      shoulder dystocia delivered by Dr. Stefano [name].  4.  April of 2001 female infant, weight  10 pounds 11 ounces, [redacted] weeks      gestation, no labor, repeat cesarean section by Dr. Pennie [name].  5.  July of 2002 twin gestation.  Infant A female 7 pounds 15 ounces at [redacted]      weeks gestation, repeat cesarean section by Dr. Pennie [name] and Dr. Stefano [name]      secondary to breech.  In July of 2002 female infant, twin B, weight 7      pounds 5 ounces, at [redacted] weeks gestation, and repeat cesarean section by      Dr. Pennie [name] and Dr. Stefano [name] secondary to breech.  6.  Current pregnancy.   GYN HISTORY:  The patient reports menarche at age 75, cycles 28 days with  duration of 5 days and no problems.  The patient denies history of abnormal  Pap smears.  The patient does have a remote history in the past of Chlamydia  in 1999.  Positive Group B Strep with pregnancies in the past.   PAST MEDICAL HISTORY:  Gestational diabetes, hypertension gestational,  depression postpartum.   PAST SURGICAL HISTORY:  2000 cholecystectomy, 2002 appendectomy, cesarean  section x3.   MEDICATIONS:  Prenatal vitamins.   ALLERGIES:  HYDROCODONE.   FAMILY HISTORY:   Paternal grandmother and brother with heart disease.  Brother with terbutaline.  Father, maternal grandmother, and maternal  grandfather with diabetes.  Maternal grandmother with stroke.  Mother and  maternal grandmother with rheumatoid arthritis.  Mother with breast cancer.  Grandmother with use of tobacco.  Mother with use of alcohol, brother  cocaine use.   GENETIC HISTORY:  The patient with advanced maternal age as noted above.  Brother with congenital heart defect.  Father of the baby's brother with  mental retardation and twins noted above.   SOCIAL HISTORY:  The patient is married to father of the baby Erin [name] who is  involved and supportive.  The patient denies the use of tobacco, alcohol, or  street drugs.  The patient and her husband do not profess to a particular  religious faith.   REVIEW OF SYSTEMS:  Negative.   PHYSICAL EXAMINATION:  VITAL SIGNS:  The patient is afebrile, vital signs  are stable.  HEENT:  Within normal limits.  LUNGS:  Clear.  HEART:  Regular rate and rhythm without murmurs, rubs, or gallops.  BREASTS:  Deferred.  ABDOMEN:  Soft and gravid in contour.  Fundal height at 48 cm.  Fetal  presentation is undetermined.  PELVIC:  Deferred.  EXTREMITIES:  Within normal limits with negative Homan's and negative edema.   ASSESSMENT:  1.  Intrauterine pregnancy at 38+ weeks.  2.  History of cesarean section x3.  3.  Advanced maternal age.   PLAN:  1.  The patient will be admitted through same day surgery to undergo repeat      cesarean section.  Risks, benefits, and alternatives of cesarean section      discussed with the patient by Dr. Pennie [name].  2.  Routine postoperative orders.     Erin [name], CNM      Erin [name], M.D.  Electronically Signed   NOS/MEDQ  D:  04/05/2005  T:  04/06/2005  Job:  [id number]

## 2010-11-10 NOTE — Discharge Summary (Signed)
[facility]  Patient:    Erin [name], Erin [name]                      MRN: [id number] Adm. Date:  [id number] Disc. Date: 12/27/00 Attending:  Shaune Spittle Dictator:   Vance Gather Duplantis, C.N.M.                           Discharge Summary  ADMISSION DIAGNOSES:          1. Intrauterine pregnancy at 37 weeks with                                  twin gestation.                               2. Previous cesarean section.                               3. Obesity.  DISCHARGE DIAGNOSES:          1. Intrauterine pregnancy at 37 weeks with                                  twin gestation.                               2. Previous cesarean section.                               3. Obesity.                               4. Status post repeat low transverse cesarean                                  section.                               5. Breast-feeding.                               6. Desires intrauterine device for                                  contraception.  PROCEDURES THIS ADMISSION:   Repeat low transverse cesarean section for delivery of a viable female infant, Twin A who weighed 7 pounds 15 ounces and had Apgars of 8 and 9, and a viable female infant, Twin B, who weighed 7 pounds 4 ounces and had Apgars of 8 and 9 on December 23, 2000 by Dr. Dierdre Forth.  HOSPITAL COURSE:              [name] is a 45 year old, married black female, gravida 5, para 3-2-0-4, at 47 weeks admitted for repeat cesarean section secondary to twin gestation and history of previous cesarean section.  She has had her pregnancy followed at [facility] and has been at risk for a history macrosomia with previous pregnancies and a previous cesarean section for her last pregnancy secondary to macrosomia and history of cesarean section with her first pregnancy for twin gestation at 38 weeks. She underwent cesarean section for delivery of a viable female infant,  Erin [name], who weighed 7 pounds 15 ounces and had Apgars of 8 and 9 at 8:24 a.m. on December 24, 2000, and a viable female infant, Twin B, named Erin [name], who weighed 7 pounds 4 ounces, had Apgars of 8 and 9 at 8:27 a.m., December 24, 2000. Her cesarean section was attended by Dr. Dierdre Forth, Dr. Leonard Schwartz, and Vance Gather Duplantis, C.N.M.  Postoperatively, she has done well. She is ambulating, voiding, and eating without difficulty. She is breast-feeding also without difficulty. Her vital signs are stable and she has remained afebrile throughout her hospital stay. She is deemed ready for discharge today. Her plans for contraception are to use the IUD.  DISCHARGE INSTRUCTIONS:       Discharge instructions are as per the [facility] handout.  DISCHARGE MEDICATIONS:        1. Motrin 800 mg p.o. q.8h. p.r.n. for pain.                               2. Tylox one to two p.o. q.4-6h. p.r.n.                                  for pain.                               3. Prenatal vitamins daily.  DISCHARGE LABORATORIES:       Hemoglobin is 9.6, WBC count is 11.0, and platelets are 197,000.  DISCHARGE FOLLOWUP:           The patient is to follow up on Monday, July 8, or Tuesday, July 9, for staple removal, and in four weeks for a postoperative visit to be able to place her IUD at that time or within the six weeks following delivery. DD:  12/27/00 TD:  12/27/00 Job: [id number]

## 2010-11-10 NOTE — Op Note (Signed)
Erin [name], Erin [name]               ACCOUNT NO.:  [id number]   MEDICAL RECORD NO.:  [id number]          PATIENT TYPE:  OUT   LOCATION:  ULT                           [facility]ACILITY:  [facility]   PHYSICIAN:  Hal Morales, M.D.DATE OF BIRTH:  02/17/1966   DATE OF PROCEDURE:  07/25/2006  DATE OF DISCHARGE:                               OPERATIVE REPORT   PREOPERATIVE DIAGNOSIS:  Retained subfascial Jackson-Pratt drain.   POSTOPERATIVE DIAGNOSIS:  Retained subfascial Jackson-Pratt drain.   OPERATION:  Exploration of incision, removal of Jackson-Pratt drain,  fascial closure, skin closure.   ANESTHESIA:  Local and monitored anesthesia care.   ESTIMATED BLOOD LOSS:  Less than 10 mL.   COMPLICATIONS:  None.   FINDINGS:  The skin incision was well healed.  The subcutaneous tissue  was clean and free of collection of fluid.  The fascia was, likewise,  well healed.  In the right half of the fascia there was a single suture  holding the end of the Jackson-Pratt drain in place.   PROCEDURE:  The patient was taken to the operating room after  appropriate identification and placed on the operating table.  After  placement of equipment for monitored anesthesia care, the abdomen was  prepped with multiple layers of Betadine and draped as a sterile field.  The previous cesarean section incision was then opened and, tugging on  the fascial drain did not produce egress of the drain.  The right pole  of the fascia was then opened until the drain could be visualized and  after clipping a single sutured, the drain was easily removed.  Copious  irrigation was carried out and the fascia closed with a running suture  of 0 Vicryl from each apex to the midline and tied in the midline.  The  fascia was noted to be well approximated.  The subcutaneous tissue was  then irrigated and the skin incision closed with a subcuticular suture  of 2-0 Vicryl.  Steri-Strips were placed across the drain sites in the  right  and left incision areas and a sterile dressing applied to the  incision.  Prior to beginning the procedure, the incision area was  infiltrated with 30 mL of a mixture of half 2% lidocaine and half 0.5%  Marcaine.  The patient tolerated the procedure well and was taken to the  recovery room in satisfactory condition.      Hal Morales, M.D.  Electronically Signed     VPH/MEDQ  D:  07/25/2006  T:  07/25/2006  Job:  [id number]

## 2010-11-10 NOTE — Op Note (Signed)
NAMESERENA, [name]               ACCOUNT NO.:  [id number]   MEDICAL RECORD NO.:  [id number]          PATIENT TYPE:  INP   LOCATION:  9114                          [facility]ILITY:  [facility]   PHYSICIAN:  Hal Morales, M.D.DATE OF BIRTH:  1965-08-07   DATE OF PROCEDURE:  04/10/2005  DATE OF DISCHARGE:                                 OPERATIVE REPORT   PREOPERATIVE DIAGNOSES:  1.  Intrauterine pregnancy at term.  2.  Prior cesarean sections x3.   POSTOPERATIVE DIAGNOSES:  1.  Intrauterine pregnancy at term.  2.  Prior cesarean sections x3.   OPERATION:  Repeat low transverse cesarean section.   SURGEON:  Hal Morales, M.D.   FIRST ASSISTANT:  Janine Limbo, M.D.   ANESTHESIA:  Epidural.   ESTIMATED BLOOD LOSS:  750 mL.   COMPLICATIONS:  None.   FINDINGS:  The patient was delivered of a female infant whose name is Erin [name], weighing 8 pounds 6 ounces.  The uterus, tubes and ovaries were normal for the gravid state.   PROCEDURE:  The patient was taken to the operating room after appropriate  identification and placed on the operating table.  After the placement of a  spinal anesthetic, she was placed in the supine position with a left lateral  tilt.  The abdomen and perineum were prepped with multiple layers of  Betadine and a Foley catheter inserted into the bladder and connected to  straight drainage.  Prior to preparation, the patients pendulous abdomen had  been taped to allow exposure of the incision site.  The abdomen was draped  as a sterile field.  The suprapubic region was infiltrated with a total of  20 mL of 0.25% Marcaine after assurance of adequate anesthesia.  The  incision was made suprapubically and the abdomen opened in layers.  The  peritoneum was entered and the bladder blade placed.  The uterine incision  was made approximately 2 cm above uterovesical fold and that incision taken  laterally bluntly.  The infant was delivered from the occiput  transverse  position and after having the nares and pharynx suctioned and cord clamped  and cut, was handed off to the awaiting pediatricians.  The appropriate cord  blood was drawn and the placenta noted to have separated from the uterus and  was removed from the operative field and sent to the cord blood collection  nurse for cord blood banking.  The uterine incision was closed with running  interlocking suture of 0 Vicryl, an imbricating suture of 0 Vicryl was  placed, and two hemostatic figure-of-eight sutures of 0 Vicryl allowed for  adequate hemostasis.  Copious irrigation was carried out.  The abdominal  peritoneum was closed with running suture of 2-0 Vicryl.  The rectus muscles  were reapproximated in the midline with figure-of-eight suture of 2-0  Vicryl.  The rectus fascia was closed with a running suture of 0 Vicryl,  then reinforced on either side of midline with figure-of-eight sutures of 0  Vicryl.  A Jackson-Pratt 7 mm drain was placed in the subcutaneous tissue  and exited through  a stab wound in the left lower quadrant.  The Al Pimple drain was sewn in with a suture of 0 silk.  The skin incision was then  closed with subcuticular sutures of 3-0 Monocryl.  A sterile dressing was  applied and the grenade applied to the Jackson-Pratt drain.  The patient was  taken from the operating room to the recovery room in satisfactory condition  having tolerated the procedure well, with sponge and instrument counts  correct.  The infant went to the full-term nursery.      Hal Morales, M.D.  Electronically Signed     VPH/MEDQ  D:  04/10/2005  T:  04/10/2005  Job:  [id number]

## 2010-11-10 NOTE — Op Note (Signed)
[facility]  Patient:    Erin [name], Erin [name]                      MRN: [id number] Proc. Date: 10/19/99 Adm. Date:  [id number] Attending:  Dierdre Forth Pearline                           Operative Report  PREOPERATIVE DIAGNOSIS:       Intrauterine pregnancy at term, prior cesarean section, desire for repeat cesarean section.  History of macrosomia.  POSTOPERATIVE DIAGNOSIS:      Intrauterine pregnancy at term, prior cesarean section, desire for repeat cesarean section.  History of macrosomia.  Plus macrosomia.  OPERATION:                    Repeat low transverse cesarean section.  SURGEON:                      Vanessa P. Pennie Rushing, M.D.  ASSISTANT:                    Wynelle Bourgeois, C.N.M.  ANESTHESIA:                   Spinal.  ESTIMATED BLOOD LOSS:         750 cc.  COMPLICATIONS:                Shoulder dystocia.  FINDINGS:                     The patient was delivered of a female infant whose ame is Silus, weighing 10 pounds 9 ounces with Apgars of 7 and 9 at one and five minutes, respectively.  DESCRIPTION OF PROCEDURE:     The patient was taken to the operating room after  appropriate identification and placed on the operating table.  After placement f a spinal anesthetic, she was placed in the supine position with a left lateral tilt. The abdomen and perineum were prepped with multiple layers of Betadine and a Foley catheter inserted into the bladder unter sterile conditions and connected to straight drainage.  The abdomen was draped as a sterile field.  After assurance of adequate anesthesia, a transverse incision was made in the abdomen and the abdomen opened in layers.  The peritoneum was entered and the visceroperitoneum overlying the uterus incised and that incision taken laterally on either side.  The uterus was then incised in the midline and that incision taken laterally on either side with bandage scissors.  The infant was  delivered from the occipitotransverse position with the aid of a Mity-Vac vacuum extractor and with a corkscrew maneuver for shoulder dystocia.  The infant was suctioned and the cord clamped and handed off to the awaiting pediatricians.  The appropriate cord blood was drawn and the placenta manually removed from the uterus.  The uterine cavity was cleared of products of conception with laparotomy pads.  The uterine incision was closed with a running interlocking suture of 0 Vicryl.  An imbricating suture of 0 Vicryl was placed and hemostasis noted to be adequate.  The visceroperitoneum was repaired  with a running suture of 0 Vicryl.  Copious irrigation was carried out and the abdominal peritoneum closed with a running suture of 2-0 Vicryl.  The rectus muscles were reapproximated in the midline with figure-of-eight sutures of 2-0 Vicryl.  They were made hemostatic  with Bovie cautery and irrigated and the rectus fascia closed with a running suture of 0 Vicryl and then reinforced on either side of the midline with figure-of-eight sutures of 0 Vicryl.  The subcutaneous tissue was irrigated and made hemostatic with Bovie cautery.  Skin staples were applied to the skin incision.  A sterile dressing was applied and the patient taken from the operating room to the recovery room in satisfactory condition having tolerated he procedure well with sponge, needle, and instrument counts were correct. DD:  10/19/99 TD:  10/20/99 Job: [id number]

## 2010-11-10 NOTE — Discharge Summary (Signed)
Erin [name], Erin [name]               ACCOUNT NO.:  [id number]   MEDICAL RECORD NO.:  [id number]          PATIENT TYPE:  INP   LOCATION:  9311                          [facility]ILITY:  [facility]   PHYSICIAN:  Janine Limbo, M.D.DATE OF BIRTH:  21-Sep-1965   DATE OF ADMISSION:  07/22/2006  DATE OF DISCHARGE:  07/26/2006                               DISCHARGE SUMMARY   ADMITTING DIAGNOSES:  1. Intrauterine pregnancy at 39-4.7 weeks.  2. Previous cesarean section x4 with plan for repeat.  3. Polyhydramnios.  4. Desires tubal ligation.  5. Morbid obesity.   DISCHARGE DIAGNOSES:  1. Intrauterine pregnancy at 39-4.7 weeks.  2. Previous cesarean section x4 with plan for repeat.  3. Polyhydramnios.  4. Desires tubal ligation.  5. Morbid obesity.  6. Retained subfascial drain.   PROCEDURES:  1. Repeat low transverse cesarean section.  2. Bilateral tubal ligation.  3. Exploration of incision with fascial closure and skin closure.   HOSPITAL COURSE:  Erin [name] is a 45 year old gravida 7, para 4-2-0-7,  was admitted on January 28, for scheduled repeat cesarean section and  tubal sterilization.  Her history had been remarkable for:  (1) Advanced  maternal age with amnio declined.  (2) Previous cesarean section x4.  (3) History of preterm delivery x1 with 2 sets of twins. (4) Previous  neonatal death of 1 twin due to twin-twin transfusion.  (5) History of  fibroids.  (6) Grand multiparity.  (7) Chronic hypertension.  (8)  Elevated BMI.  (9) Polyhydramnios.  (10) History of wound separation.  (11) History of gestational diabetes in previous pregnancy.  (12)  History of LGA infant.  The patient was taken to the operating room  where Dr. Dierdre Forth performed a repeat low transverse cesarean  section and bilateral tubal ligation.  This was assisted by Dr. Marline Backbone and Nigel Bridgeman, Certified Nurse Midwife.  Findings were a  viable female by the name of Chloe, weighed 8 pounds, 8 ounces.   Apgars  were 7 and 9.  The patient did have a subfascial drain placed on the  left and a placement of a Jackson-Pratt drain on the right.  Tubes and  placenta were sent to pathology.  The patient tolerated the procedure  well and was sent to recovery in good condition.  Infant was initially  taken to central nursery but then was transferred to NICU secondary to  respiratory issues.  Presumptive diagnosis of pneumonia was given.  The  infant did have to be ventilated but was stable through the hospital  course of the patient.  By postop day 0, the patient was afebrile.  Her  fundus was firm.  It was 5 fingerbreadths above the umbilicus probably  secondary to a fibroid, but it seemed firm, and lochia was moderate.  Initially, Methergine series was initiated but in light of the patient's  slight elevation of blood pressure, it was deferred.  Foley was removed.  Later in that evening, the patient was up ad lib to the bathroom without  difficulty.  By postop day 1, the patient was doing well.  Her  drains  were draining small amount.  Her blood pressures were in the 130s-  140s/80s-90.  Pastoral care saw the patient in light of her NICU  hospitalization with the baby.  Postop day 2, the patient was continuing  to do well.  She was pumping her breasts for breast milk.  By postop day  3, the patient was doing well.  Infant was on the ventilator but there  was some question about pneumothorax as well.  The patient was initially  going to go home that day; however, when the drains were assessed, the  subfascial drain was unable to be removed.  Radiology studies were  ordered, and it appeared the subfascial drain appeared to be sewn into  the fascial incision.  The patient was taken to the OR for drain  removal.  There was exploration of the incision, fascial closure and  skin closure performed.  Anesthesia was local and MAC.  Findings were  that the subfascial drain was connected by 1 suture in the  right side of  the fascial incision.  This was removed without difficulty.  The J-P  drain had also previously been removed.  The patient tolerated the  procedure well and was taken back to her room in good condition.  By  postop day 4, the patient was continuing to do well.  Infant was stable  in the NICU.  They were hoping to begin to wean off the ventilator  today.  The patient's blood pressures were in the 120s-140s/70s-94.  Her  chest was clear.  Her abdomen was pendulous.  Her dressing was moist  with old drainage, but the incision was clean, dry, and intact.  Fundus  was firm.  Lochia was scant.  Extremities were normal.  The patient was  deemed to have received full benefit of her hospital stay and was  discharged home.   DISCHARGE INSTRUCTIONS:  Per [facility] handout.   DISCHARGE MEDICATIONS:  1. Motrin 600 mg p.o. q.6h. p.r.n. pain.  2. Percocet 5/325, 1-2 p.o. q.3-4h. p.r.n. pain.  3. Prenatal vitamin 1 p.o. daily.   Discharge followup instructions also included very specific incisional  hygiene and care measures.  Support for infant in NICU was also  provided.  Discharge followup will occur in 6 weeks at [facility].      Renaldo Reel Emilee Hero, C.N.M.      Janine Limbo, M.D.  Electronically Signed    VLL/MEDQ  D:  07/26/2006  T:  07/26/2006  Job:  [id number]
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
## 2010-11-10 NOTE — Op Note (Signed)
[facility]  Patient:    Erin [name], Erin [name]                      MRN: [id number] Proc. Date: 12/24/00 Adm. Date:  [id number] Attending:  Dierdre Forth [place]                           Operative Report  PREOPERATIVE DIAGNOSIS:       Twin gestation at 37 weeks, prior cesarean section, desire for repeat cesarean section, breech presentation of the first twin, maternal morbid obesity with weight at 308 pounds.  POSTOPERATIVE DIAGNOSIS:      Twin gestation at 37 weeks, prior cesarean section, desire for repeat cesarean section, breech presentation of the first twin, maternal morbid obesity with weight at 308 pounds.  OPERATION:                    Repeat low transverse cesarean section.  SURGEON:                      Vanessa P. Pennie Rushing, M.D.  ASSISTANT:                    Janine Limbo, M.D. and Vance Gather Duplantis, C.N.M.  ANESTHESIA:                   Epidural.  ESTIMATED BLOOD LOSS:         750 cc.  COMPLICATIONS:                None.  FINDINGS:                     The patient was delivered of twin infants, the first baby, baby A was a girl whose name is Kenise weighing 7 pounds 15 ounces with Apgars of 8 and 9 at one and five minutes respectively.  The second twin, baby B, was a boy whose name is [place], weighing 7 pounds 4 ounces with Apgars of 8 and 9 at one and five minutes respectively.  The uterus, tubes, and ovaries were normal for the gravid state.  DESCRIPTION OF PROCEDURE:     The patient was taken to the operating room after appropriate identification and placed on the operating table.  After placement of an epidural anesthetic, she was placed in the supine position with a left lateral tilt.  The abdomen was taped to the crossbar in order to allow access to the suprapubic region for incision.  The abdomen and perineum were prepped with multiple layers of Betadine.  A Foley catheter was inserted into the bladder and connected  to straight drainage.  The abdomen was draped as a sterile field.  A transverse incision was made in the abdomen at the site of the previous cesarean section incision and the abdomen opened in layers. The peritoneum was entered.  A bladder blade was placed.  The uterus was incised approximately 1 cm above the uterovesical fold and that incision taken laterally bluntly.  Membranes were ruptured on baby A with clear fluid and the baby was delivered as a footling breech.  The cord was clamped and cut after the nares and pharynx were suctioned and the infant was handed off to the awaiting pediatricians.  The second infant was noted to be in an oblique lie with the head at the maternal right and the breech in the left  upper quadrant. The membranes were ruptured and that infant was converted to a footling breech and delivered as such.  The infant had nares and pharynx suctioned and the cord clamped and cut and was handed off to the awaiting pediatricians.  Cord blood was drawn from each of the umbilical cords and the placenta manually removed from the uterus.  The uterine incision was closed with a running interlocking suture of 0 Vicryl and an imbricating suture of 0 Vicryl was placed.  Hemostasis was noted to be adequate.  Copious irrigation was carried out.  The abdominal peritoneum was closed with a running suture of 2-0 Vicryl. The rectus muscles were noted to be hemostatic and irrigated and the rectus fascia was closed with a running suture of 0 Vicryl and then reinforced on either side of midline with figure-of-eight sutures of 0 Vicryl.  The subcutaneous tissue was made hemostatic with Bovie cautery after irrigation and skin staples applied to the skin incision.  A sterile dressing was applied and the patient taken from the operating room to the recovery room in satisfactory condition having tolerated the procedure well with sponge, needle, and instrument counts were correct. DD:   12/24/00 TD:  12/24/00 Job: [id number]
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
## 2011-01-16 ENCOUNTER — Emergency Department (HOSPITAL_COMMUNITY)
Admission: EM | Admit: 2011-01-16 | Discharge: 2011-01-16 | Disposition: A | Discharge status: Home / Self Care | Payer: BC Managed Care – PPO | Attending: Emergency Medicine | Admitting: Emergency Medicine

## 2011-01-16 DIAGNOSIS — R42 Dizziness and giddiness: Secondary | ICD-10-CM | POA: Insufficient documentation

## 2011-01-16 DIAGNOSIS — E039 Hypothyroidism, unspecified: Secondary | ICD-10-CM | POA: Insufficient documentation

## 2011-01-16 DIAGNOSIS — R110 Nausea: Secondary | ICD-10-CM | POA: Insufficient documentation

## 2011-01-16 DIAGNOSIS — E669 Obesity, unspecified: Secondary | ICD-10-CM | POA: Insufficient documentation

## 2011-01-16 DIAGNOSIS — I10 Essential (primary) hypertension: Secondary | ICD-10-CM | POA: Insufficient documentation

## 2011-01-16 DIAGNOSIS — F411 Generalized anxiety disorder: Secondary | ICD-10-CM | POA: Insufficient documentation

## 2011-01-16 DIAGNOSIS — R55 Syncope and collapse: Secondary | ICD-10-CM | POA: Insufficient documentation

## 2011-01-16 DIAGNOSIS — D649 Anemia, unspecified: Secondary | ICD-10-CM | POA: Insufficient documentation

## 2011-01-16 DIAGNOSIS — R51 Headache: Secondary | ICD-10-CM | POA: Insufficient documentation

## 2011-01-16 LAB — URINALYSIS, ROUTINE W REFLEX MICROSCOPIC
Bilirubin Urine: NEGATIVE
Glucose, UA: NEGATIVE mg/dL
Ketones, ur: NEGATIVE mg/dL
Nitrite: NEGATIVE
Protein, ur: NEGATIVE mg/dL
Specific Gravity, Urine: 1.026 (ref 1.005–1.030)
Urobilinogen, UA: 0.2 mg/dL (ref 0.0–1.0)
pH: 6 (ref 5.0–8.0)

## 2011-01-16 LAB — POCT PREGNANCY, URINE: Preg Test, Ur: NEGATIVE

## 2011-01-16 LAB — RAPID URINE DRUG SCREEN, HOSP PERFORMED
Amphetamines: NOT DETECTED
Barbiturates: NOT DETECTED
Benzodiazepines: NOT DETECTED
Cocaine: NOT DETECTED
Opiates: NOT DETECTED
Tetrahydrocannabinol: NOT DETECTED

## 2011-01-16 LAB — POCT I-STAT, CHEM 8
BUN: 18 mg/dL (ref 6–23)
Calcium, Ion: 1.12 mmol/L (ref 1.12–1.32)
Chloride: 106 mEq/L (ref 96–112)
Creatinine, Ser: 0.9 mg/dL (ref 0.50–1.10)
Glucose, Bld: 117 mg/dL — ABNORMAL HIGH (ref 70–99)
HCT: 33 % — ABNORMAL LOW (ref 36.0–46.0)
Hemoglobin: 11.2 g/dL — ABNORMAL LOW (ref 12.0–15.0)
Potassium: 3.7 mEq/L (ref 3.5–5.1)
Sodium: 141 mEq/L (ref 135–145)
TCO2: 23 mmol/L (ref 0–100)

## 2011-01-16 LAB — TROPONIN I: Troponin I: <0.3 ng/mL (ref <0.30)

## 2011-01-16 LAB — DIFFERENTIAL
Basophils Absolute: 0 10*3/uL (ref 0.0–0.1)
Basophils Relative: 0 % (ref 0–1)
Eosinophils Absolute: 0.1 10*3/uL (ref 0.0–0.7)
Eosinophils Relative: 1 % (ref 0–5)
Lymphocytes Relative: 35 % (ref 12–46)
Lymphs Abs: 4.3 10*3/uL — ABNORMAL HIGH (ref 0.7–4.0)
Monocytes Absolute: 0.5 10*3/uL (ref 0.1–1.0)
Monocytes Relative: 4 % (ref 3–12)
Neutro Abs: 7.4 10*3/uL (ref 1.7–7.7)
Neutrophils Relative %: 60 % (ref 43–77)

## 2011-01-16 LAB — CBC
HCT: 31.2 % — ABNORMAL LOW (ref 36.0–46.0)
Hemoglobin: 10 g/dL — ABNORMAL LOW (ref 12.0–15.0)
MCH: 23.4 pg — ABNORMAL LOW (ref 26.0–34.0)
MCHC: 32.1 g/dL (ref 30.0–36.0)
MCV: 72.9 fL — ABNORMAL LOW (ref 78.0–100.0)
Platelets: 353 10*3/uL (ref 150–400)
RBC: 4.28 MIL/uL (ref 3.87–5.11)
RDW: 16.1 % — ABNORMAL HIGH (ref 11.5–15.5)
WBC: 12.3 10*3/uL — ABNORMAL HIGH (ref 4.0–10.5)

## 2011-01-16 LAB — URINE MICROSCOPIC-ADD ON

## 2011-06-17 ENCOUNTER — Other Ambulatory Visit: Payer: Self-pay

## 2011-06-17 ENCOUNTER — Emergency Department (HOSPITAL_COMMUNITY): Payer: BC Managed Care – PPO

## 2011-06-17 ENCOUNTER — Emergency Department (HOSPITAL_COMMUNITY)
Admission: EM | Admit: 2011-06-17 | Discharge: 2011-06-17 | Disposition: A | Discharge status: Home / Self Care | Payer: BC Managed Care – PPO | Attending: Emergency Medicine | Admitting: Emergency Medicine

## 2011-06-17 ENCOUNTER — Encounter: Payer: Self-pay | Admitting: *Deleted

## 2011-06-17 DIAGNOSIS — R42 Dizziness and giddiness: Secondary | ICD-10-CM | POA: Insufficient documentation

## 2011-06-17 DIAGNOSIS — R197 Diarrhea, unspecified: Secondary | ICD-10-CM | POA: Insufficient documentation

## 2011-06-17 DIAGNOSIS — R05 Cough: Secondary | ICD-10-CM | POA: Insufficient documentation

## 2011-06-17 DIAGNOSIS — R279 Unspecified lack of coordination: Secondary | ICD-10-CM | POA: Insufficient documentation

## 2011-06-17 DIAGNOSIS — R270 Ataxia, unspecified: Secondary | ICD-10-CM

## 2011-06-17 DIAGNOSIS — R059 Cough, unspecified: Secondary | ICD-10-CM | POA: Insufficient documentation

## 2011-06-17 HISTORY — DX: Disorder of thyroid, unspecified: E07.9

## 2011-06-17 LAB — CBC
HCT: 31.1 % — ABNORMAL LOW (ref 36.0–46.0)
Hemoglobin: 9.5 g/dL — ABNORMAL LOW (ref 12.0–15.0)
MCH: 22.4 pg — ABNORMAL LOW (ref 26.0–34.0)
MCHC: 30.5 g/dL (ref 30.0–36.0)
MCV: 73.3 fL — ABNORMAL LOW (ref 78.0–100.0)
Platelets: 379 10*3/uL (ref 150–400)
RBC: 4.24 MIL/uL (ref 3.87–5.11)
RDW: 16.2 % — ABNORMAL HIGH (ref 11.5–15.5)
WBC: 12.2 10*3/uL — ABNORMAL HIGH (ref 4.0–10.5)

## 2011-06-17 LAB — DIFFERENTIAL
Basophils Absolute: 0 10*3/uL (ref 0.0–0.1)
Basophils Relative: 0 % (ref 0–1)
Eosinophils Absolute: 0.1 10*3/uL (ref 0.0–0.7)
Eosinophils Relative: 1 % (ref 0–5)
Lymphocytes Relative: 41 % (ref 12–46)
Lymphs Abs: 5 10*3/uL — ABNORMAL HIGH (ref 0.7–4.0)
Monocytes Absolute: 0.7 10*3/uL (ref 0.1–1.0)
Monocytes Relative: 5 % (ref 3–12)
Neutro Abs: 6.4 10*3/uL (ref 1.7–7.7)
Neutrophils Relative %: 52 % (ref 43–77)

## 2011-06-17 LAB — POCT I-STAT, CHEM 8
BUN: 13 mg/dL (ref 6–23)
Calcium, Ion: 1.04 mmol/L — ABNORMAL LOW (ref 1.12–1.32)
Chloride: 105 mEq/L (ref 96–112)
Creatinine, Ser: 0.8 mg/dL (ref 0.50–1.10)
Glucose, Bld: 92 mg/dL (ref 70–99)
HCT: 32 % — ABNORMAL LOW (ref 36.0–46.0)
Hemoglobin: 10.9 g/dL — ABNORMAL LOW (ref 12.0–15.0)
Potassium: 3.7 mEq/L (ref 3.5–5.1)
Sodium: 141 mEq/L (ref 135–145)
TCO2: 26 mmol/L (ref 0–100)

## 2011-06-17 MED ORDER — MECLIZINE HCL 25 MG PO TABS: 25.0000 mg | ORAL_TABLET | Freq: Three times a day (TID) | ORAL | Status: AC | PRN

## 2011-06-17 MED ORDER — IOHEXOL 350 MG/ML SOLN
75.0000 mL | Freq: Once | INTRAVENOUS | Status: AC | PRN
  Administered 2011-06-17: 75 mL via INTRAVENOUS

## 2011-06-17 MED ORDER — IOHEXOL 300 MG/ML  SOLN: 75.0000 mL | Freq: Once | INTRAMUSCULAR | Status: DC | PRN

## 2011-06-17 NOTE — ED Provider Notes (Signed)
History     CSN: [id number]  Arrival date & time 06/17/11  1919   First MD Initiated Contact with Patient 06/17/11 2013      Chief Complaint  Patient presents with  . Dizziness    (Consider location/radiation/quality/duration/timing/severity/associated sxs/prior treatment) HPI Comments: Recently getting over flu, still with occasional remaining diarrhea, productive cough.  Patient is a 45 y.o. female presenting with neurologic complaint. The history is provided by the patient. No language interpreter was used.  Neurologic Problem The primary symptoms include dizziness. Primary symptoms do not include loss of sensation, speech change, memory loss, fever, nausea or vomiting. Primary symptoms comment: Ataxia The symptoms began 1 to 2 hours ago. The episode lasted 2 hours. The symptoms are unchanged. The neurological symptoms are focal. Context: While walking.  Dizziness does not occur with nausea or vomiting.    Past Medical History  Diagnosis Date  . Thyroid disease     Past Surgical History  Procedure Date  . Cesarean section   . Appendectomy   . Cholecystectomy   . Tumor removal   . Hernia repair     Family History  Problem Relation Age of Onset  . Hypertension Mother   . Cancer Mother   . Hypertension Father   . Diabetes Father   . Cancer Father     History  Substance Use Topics  . Smoking status: Never Smoker   . Smokeless tobacco: Not on file  . Alcohol Use: No    OB History    Grav Para Term Preterm Abortions TAB SAB Ect Mult Living                  Review of Systems  Constitutional: Negative for fever.  Respiratory: Positive for cough. Negative for shortness of breath.   Cardiovascular: Negative for chest pain.  Gastrointestinal: Positive for diarrhea. Negative for nausea and vomiting.  Neurological: Positive for dizziness. Negative for speech change.  Psychiatric/Behavioral: Negative for memory loss.  All other systems reviewed and are  negative.    Allergies  Hydrocodone  Home Medications   Current Outpatient Rx  Name Route Sig Dispense Refill  . LEVOTHYROXINE SODIUM 50 MCG PO TABS Oral Take 50 mcg by mouth daily.        BP 128/92  Pulse 100  Temp(Src) 98.8 F (37.1 C) (Oral)  Resp 18  SpO2 97%  LMP 06/09/2011  Physical Exam  Nursing note and vitals reviewed. Constitutional: She is oriented to person, place, and time. She appears well-developed and well-nourished. No distress.  HENT:  Head: Normocephalic and atraumatic.  Eyes: EOM are normal. Pupils are equal, round, and reactive to light.  Neck: Normal range of motion. Neck supple.  Cardiovascular: Normal rate and regular rhythm.  Exam reveals no friction rub.   No murmur heard. Pulmonary/Chest: Effort normal and breath sounds normal. No respiratory distress. She has no wheezes. She has no rales.  Abdominal: Soft. She exhibits no distension. There is no tenderness. There is no rebound.  Musculoskeletal: Normal range of motion. She exhibits no edema.  Neurological: She is alert and oriented to person, place, and time. No cranial nerve deficit or sensory deficit. She exhibits normal muscle tone. Coordination (Mild difficulty with R sided finger-to-nose testing) and gait (Ataxic) abnormal. GCS eye subscore is 4. GCS verbal subscore is 5. GCS motor subscore is 6.  Skin: She is not diaphoretic.    ED Course  Procedures (including critical care time)  Labs Reviewed  CBC - Abnormal; Notable  for the following:    WBC 12.2 (*)    Hemoglobin 9.5 (*)    HCT 31.1 (*)    MCV 73.3 (*)    MCH 22.4 (*)    RDW 16.2 (*)    All other components within normal limits  DIFFERENTIAL - Abnormal; Notable for the following:    Lymphs Abs 5.0 (*)    All other components within normal limits  POCT I-STAT, CHEM 8 - Abnormal; Notable for the following:    Calcium, Ion 1.04 (*)    Hemoglobin 10.9 (*)    HCT 32.0 (*)    All other components within normal limits  I-STAT,  CHEM 8   No results found.   1. Dizziness   2. Ataxia   3. Vertigo      I-STAT, chem 8 (Final result)  Abnormal  Component (Lab Inquiry)      Result Time  NA  K  CL  BUN  Creatinine, Ser    06/17/11 2040  141  3.7  105  13  0.80           Result Time  GLUCOSE  CA ION  TCO2  HGB  HCT    06/17/11 2040  92  1.04 (L)  26  10.9 (L)  32.0 (L)         CBC (Final result)  Abnormal  Component (Lab Inquiry)      Result Time  WBC  RBC  HGB  HCT  MCV    06/17/11 2020  12.2 (H)  4.24  9.5 (L)  31.1 (L)  73.3 (L)           Result Time  MCH  MCHC  RDW  PLT    06/17/11 2020  22.4 (L)  30.5  16.2 (H)  379         Differential (Final result)  Abnormal  Component (Lab Inquiry)      Result Time  NEUTRO PCT  AB NEUTRO  LYMPHO PCT  AB LYM  MONO PCT    06/17/11 2020  52  6.4  41  5.0 (H)  5           Result Time  MONO ABS  EOS PCT  EOSINO ABS  BASOS PCT  BASOS ABS    06/17/11 2020  0.7  1  0.1  0  0.     CT Head Wo Contrast (Final result)   Result time:06/17/11 2143    Final result by Rad Results In Interface (06/17/11 21:43:19)    Narrative:   *RADIOLOGY REPORT*  Clinical Data: Dizziness and neck pain.  CT HEAD WITHOUT CONTRAST  Technique: Contiguous axial images were obtained from the base of the skull through the vertex without contrast.  Comparison: None.  Findings: There is no evidence of acute infarction, mass lesion, or intra- or extra-axial hemorrhage on CT.  The posterior fossa, including the cerebellum, brainstem and fourth ventricle, is within normal limits. The third and lateral ventricles, and basal ganglia are unremarkable in appearance. The cerebral hemispheres are symmetric in appearance, with normal gray- white differentiation. No mass effect or midline shift is seen.  There is no evidence of fracture; visualized osseous structures are unremarkable in appearance. The orbits are within normal limits. The paranasal sinuses and mastoid air cells are  well-aerated. No significant soft tissue abnormalities are seen.  IMPRESSION: Unremarkable noncontrast CT of the head.  Original Report Authenticated By: Tonia Ghent, M.D.    MDM  [phone]  p/w dizziness, ataxia for past 2 hours. Began all of a sudden, no precipitating factors. Hx of thyroid issues, otherwise normal. Worse with positional changes. No double vision, no nausea. No other motor/sensory complaints. AFVSS. Ataxic, mild coordination deficits with finger-to-nose on the R. No CN deficits. No gross motor/sensory deficits.  Concern for possible vertebral or carotid artery dissection vs stroke. CT Head, CTA neck ordered.  Neurology consulted to see patient prior to CT scan. I reviewed CT scan, read as negative, no signs dissection, stroke. Dr. Jeraldine Loots spoke with radiologist about negative results. On re-exam, able to ambulate easily, no ataxia. Symptoms c/w vertigo. Patient stable for discharge, given Meclizine to go home with, instructed to f/u with PCP in a few days.       Elwin Mocha, MD 06/17/11 [phone]

## 2011-06-17 NOTE — ED Notes (Signed)
MD at bedside. 

## 2011-06-17 NOTE — ED Notes (Signed)
C/o dizziness, onset 2 hrs ago, also reports cough, diarrhea & discomfort to R occipital neck area. Describes cough as with some mucus (cloudy white), diarrhea as watery (& soft), (denies: nv fever, other pain or sob).

## 2011-06-17 NOTE — ED Notes (Signed)
Pt. Discharged to home with family via w/c,Pt. Alert and oriented, NAD noted

## 2011-06-17 NOTE — ED Provider Notes (Signed)
  I performed a history and physical examination of Erin [name] and discussed her management with Dr. Gwendolyn Grant.  I agree with the history, physical, assessment, and plan of care, with the following exceptions: None  This patient presents with new ataxia, focal right paravertebral cervical pain, and on exam has disequilibrium, mild dysdiadochokinesia.  The patient's initial complaints were concerning for vertebral artery dissection. Radiographically these, which were discussed with the radiologist by myself and reviewed by myself were reassuring.  The patient was discharged in stable condition  I was present for the following procedures: None Time Spent in Critical Care of the patient: None   Daren Yeagle, Elvis Coil, MD 06/17/11 2348

## 2011-06-18 NOTE — Consult Note (Signed)
Reason for Consult:Ataxia Referring Physician: Elwin Mocha, M.D.   Erin [name] is an 45 y.o. female.  HPI: Pt notes she was running errands earlier today with her husband.  When she tried to get out of her car she noted the sudden onset of an unsteady/near-syncopal sensation.  She was able to return home but notes she continued to feel "off balance" and tried to fix dinner. Then tried to lay down but "felt worse".  Around this time she began to experience a "throbbing' posterior headache/neck pain, with some trouble focusing.  Then presented to ER, but denies any weakness/numbness, hearing loss, visual loss, etc..  In ER she was noted to be ataxic, and (given the neck pain) the possibility of stroke or arterial dissection were considered.  Neurology consult obtained due to above.  She does note a prior similar episode about 12 yrs ago while pregnant.  During that time was diagnosed with "vertigo", attributed to an ear infection.  Past Medical History  Diagnosis Date  . Thyroid disease     Past Surgical History  Procedure Date  . Cesarean section   . Appendectomy   . Cholecystectomy   . Tumor removal   . Hernia repair     Family History  Problem Relation Age of Onset  . Hypertension Mother   . Cancer Mother   . Hypertension Father   . Diabetes Father   . Cancer Father     Social History:  reports that she has never smoked. She does not have any smokeless tobacco history on file. She reports that she does not drink alcohol or use illicit drugs.  Allergies:  Allergies  Allergen Reactions  . Hydrocodone Hives    Medications: I have reviewed the patient's current medications.  Results for orders placed during the hospital encounter of 06/17/11 (from the past 48 hour(s))  CBC     Status: Abnormal   Collection Time   06/17/11  7:53 PM      Component Value Range Comment   WBC 12.2 (*) 4.0 - 10.5 (K/uL)    RBC 4.24  3.87 - 5.11 (MIL/uL)    Hemoglobin 9.5 (*) 12.0 - 15.0  (g/dL)    HCT 40.9 (*) 81.1 - 46.0 (%)    MCV 73.3 (*) 78.0 - 100.0 (fL)    MCH 22.4 (*) 26.0 - 34.0 (pg)    MCHC 30.5  30.0 - 36.0 (g/dL)    RDW 91.4 (*) 78.2 - 15.5 (%)    Platelets 379  150 - 400 (K/uL)   DIFFERENTIAL     Status: Abnormal   Collection Time   06/17/11  7:53 PM      Component Value Range Comment   Neutrophils Relative 52  43 - 77 (%)    Neutro Abs 6.4  1.7 - 7.7 (K/uL)    Lymphocytes Relative 41  12 - 46 (%)    Lymphs Abs 5.0 (*) 0.7 - 4.0 (K/uL)    Monocytes Relative 5  3 - 12 (%)    Monocytes Absolute 0.7  0.1 - 1.0 (K/uL)    Eosinophils Relative 1  0 - 5 (%)    Eosinophils Absolute 0.1  0.0 - 0.7 (K/uL)    Basophils Relative 0  0 - 1 (%)    Basophils Absolute 0.0  0.0 - 0.1 (K/uL)   POCT I-STAT, CHEM 8     Status: Abnormal   Collection Time   06/17/11  8:38 PM      Component  Value Range Comment   Sodium 141  135 - 145 (mEq/L)    Potassium 3.7  3.5 - 5.1 (mEq/L)    Chloride 105  96 - 112 (mEq/L)    BUN 13  6 - 23 (mg/dL)    Creatinine, Ser 1.61  0.50 - 1.10 (mg/dL)    Glucose, Bld 92  70 - 99 (mg/dL)    Calcium, Ion 0.96 (*) 1.12 - 1.32 (mmol/L)    TCO2 26  0 - 100 (mmol/L)    Hemoglobin 10.9 (*) 12.0 - 15.0 (g/dL)    HCT 04.5 (*) 40.9 - 46.0 (%)     Ct Head Wo Contrast  06/17/2011  *RADIOLOGY REPORT*  Clinical Data: Dizziness and neck pain.  CT HEAD WITHOUT CONTRAST  Technique:  Contiguous axial images were obtained from the base of the skull through the vertex without contrast.  Comparison: None.  Findings: There is no evidence of acute infarction, mass lesion, or intra- or extra-axial hemorrhage on CT.  The posterior fossa, including the cerebellum, brainstem and fourth ventricle, is within normal limits.  The third and lateral ventricles, and basal ganglia are unremarkable in appearance.  The cerebral hemispheres are symmetric in appearance, with normal gray- white differentiation.  No mass effect or midline shift is seen.  There is no evidence of  fracture; visualized osseous structures are unremarkable in appearance.  The orbits are within normal limits. The paranasal sinuses and mastoid air cells are well-aerated.  No significant soft tissue abnormalities are seen.  IMPRESSION: Unremarkable noncontrast CT of the head.  Original Report Authenticated By: Tonia Ghent, M.D.    Review of Systems: A comprehensive review of systems was negative except for: Neurological: positive for dizziness and gait problems  Blood pressure 121/88, pulse 86, temperature 97.1 F (36.2 C), temperature source Oral, resp. rate 20, last menstrual period 06/09/2011, SpO2 99.00%.  Neurological Exam:  Patient is alert and oriented x 3.  No acute distress.  Fluent speech.  Able to name, repeat, and follow commands.  EOMI, VFFC, PERRL.  Face symmetric with normal sensation.  Tongue protrudes midline, otherwise CN's II-XII intact. Motor: Normal bulk and tone with 5/5 strength throughout.  Sensation:  Normal pinprick, vibration, and proprioception.  Cerebellar:  Normal finger to nose  No pronator drift.  DTR's 2+ throughout with toes downgoing  Cardiac:  RRR without murmur, rub, gallop Carotids: no bruits Lungs: CTAB Abdomen: soft, NT/ND with NABS Skin: no obvious cuts, abrasion, bruises.  Notably dry skin in lower extremities   Assessment/Plan: 1) Ataxia/ diziness  Plan:  45 YO BF who presents with acute onset of diziness/unsteadiness, and occipital headache.  Neurological exam is non-focal.  I really do not appreciate any findings that would suggest a brainstem stroke.  Agree that differential is broad and includes arterial dissection, vestibular disease, migraine, stroke, etc..  1) agree with plans for CT angio of head and neck to exclude dissection.  Note negative Head CT.   If studies negative, then would need Head MRI (if able ...obese) 2) continue symptomatic Rx (Antivert, fluids, etc..) 3)PT, OT, ST...consider admission if ataxia persists   Erin [name],  Erin [name] 06/18/2011, 12:27 AM

## 2012-07-28 ENCOUNTER — Emergency Department (HOSPITAL_COMMUNITY): Payer: BC Managed Care – PPO

## 2012-07-28 ENCOUNTER — Encounter (HOSPITAL_COMMUNITY): Payer: Self-pay | Admitting: *Deleted

## 2012-07-28 ENCOUNTER — Emergency Department (HOSPITAL_COMMUNITY)
Admission: EM | Admit: 2012-07-28 | Discharge: 2012-07-28 | Disposition: A | Discharge status: Home / Self Care | Payer: BC Managed Care – PPO | Attending: Emergency Medicine | Admitting: Emergency Medicine

## 2012-07-28 DIAGNOSIS — E079 Disorder of thyroid, unspecified: Secondary | ICD-10-CM | POA: Insufficient documentation

## 2012-07-28 DIAGNOSIS — Y9289 Other specified places as the place of occurrence of the external cause: Secondary | ICD-10-CM | POA: Insufficient documentation

## 2012-07-28 DIAGNOSIS — W1789XA Other fall from one level to another, initial encounter: Secondary | ICD-10-CM | POA: Insufficient documentation

## 2012-07-28 DIAGNOSIS — Y939 Activity, unspecified: Secondary | ICD-10-CM | POA: Insufficient documentation

## 2012-07-28 DIAGNOSIS — Z79899 Other long term (current) drug therapy: Secondary | ICD-10-CM | POA: Insufficient documentation

## 2012-07-28 DIAGNOSIS — S92253A Displaced fracture of navicular [scaphoid] of unspecified foot, initial encounter for closed fracture: Secondary | ICD-10-CM | POA: Insufficient documentation

## 2012-07-28 MED ORDER — IBUPROFEN 800 MG PO TABS: 800.0000 mg | ORAL_TABLET | Freq: Three times a day (TID) | ORAL | Status: DC

## 2012-07-28 NOTE — ED Provider Notes (Signed)
Medical screening examination/treatment/procedure(s) were performed by non-physician practitioner and as supervising physician I was immediately available for consultation/collaboration.  Iyonnah Ferrante R. Joanne Brander, MD 07/28/12 2358 

## 2012-07-28 NOTE — ED Notes (Signed)
Pt reports fall coming down the hill-reports R ankle pain.  Pt denies hitting her head or denies LOC.  R ankle is painful with palpation.  Slight swelling noted.

## 2012-07-28 NOTE — ED Provider Notes (Signed)
History   This chart was scribed for Marlon Pel PA-C, a non-physician practitioner working with No att. providers found by Lewanda Rife, ED Scribe. This patient was seen in room WTR9/WTR9 and the patient's care was started at 4:55pm.    CSN: [id number]  Arrival date & time 07/28/12  1536   First MD Initiated Contact with Patient 07/28/12 1556      Chief Complaint  Patient presents with  . Fall    (Consider location/radiation/quality/duration/timing/severity/associated sxs/prior treatment) HPI Erin [name] is a 47 y.o. female who presents to the Emergency Department complaining of fall onset 3:00 pm when slipping down a hill a muddy hill. Pt reports constant moderate right ankle pain. Pt denies head injury, and loss of consciousness. Pt reports pain is aggravated with weight baring and manipulation. Pt denies taking any medications at home to treat pain. Pt denies history of similar injury.    Past Medical History  Diagnosis Date  . Thyroid disease     Past Surgical History  Procedure Date  . Cesarean section   . Appendectomy   . Cholecystectomy   . Tumor removal   . Hernia repair     Family History  Problem Relation Age of Onset  . Hypertension Mother   . Cancer Mother   . Hypertension Father   . Diabetes Father   . Cancer Father     History  Substance Use Topics  . Smoking status: Never Smoker   . Smokeless tobacco: Not on file  . Alcohol Use: No    OB History    Grav Para Term Preterm Abortions TAB SAB Ect Mult Living                  Review of Systems  Constitutional: Negative.   HENT: Negative.   Respiratory: Negative.   Cardiovascular: Negative.   Gastrointestinal: Negative.   Musculoskeletal: Positive for myalgias and joint swelling. Negative for back pain.  Skin: Negative.   Neurological: Negative.  Negative for headaches.  Hematological: Negative.   Psychiatric/Behavioral: Negative.   All other systems reviewed and are  negative.   A complete 10 system review of systems was obtained and all systems are negative except as noted in the HPI and PMH.    Allergies  Hydrocodone  Home Medications   Current Outpatient Rx  Name  Route  Sig  Dispense  Refill  . ERGOCALCIFEROL 50000 UNITS PO CAPS   Oral   Take 50,000 Units by mouth 2 (two) times a week. Monday and Wednesday.         [name] LEVOTHYROXINE SODIUM 50 MCG PO TABS   Oral   Take 50 mcg by mouth daily.           . IBUPROFEN 800 MG PO TABS   Oral   Take 1 tablet (800 mg total) by mouth 3 (three) times daily.   21 tablet   0     BP 131/84  Pulse 78  Temp 98.3 F (36.8 C) (Oral)  Resp 16  SpO2 99%  LMP 07/19/2012  Physical Exam  Nursing note and vitals reviewed. Constitutional: She is oriented to person, place, and time. She appears well-developed and well-nourished. No distress.       Dried mud on clothes. Pt smiling and laughing.   HENT:  Head: Normocephalic and atraumatic.  Eyes: EOM are normal.  Neck: Neck supple. No tracheal deviation present.  Cardiovascular: Normal rate and intact distal pulses.   Pulmonary/Chest: Effort normal. No respiratory  distress.  Abdominal: Soft.  Musculoskeletal: Normal range of motion.       Right knee: Normal. She exhibits normal range of motion, no swelling and no effusion.       Right ankle: She exhibits swelling (bilateral ankle swelling). She exhibits no ecchymosis, no deformity and normal pulse. tenderness. Lateral malleolus (on palpation) tenderness found. No medial malleolus tenderness found. Achilles tendon normal.       Normal dorsalis pedis   Neurological: She is alert and oriented to person, place, and time.  Skin: Skin is warm and dry.  Psychiatric: She has a normal mood and affect. Her behavior is normal.    ED Course  Procedures (including critical care time) 5:04 PM: Pt states she does cannot tolerate a splint and crutches due to body habitus. Pt states she would prefer cam walker  boot.     Medications  ergocalciferol (VITAMIN D2) 50000 UNITS capsule (not administered)  ibuprofen (ADVIL,MOTRIN) 800 MG tablet (not administered)    Labs Reviewed - No data to display Dg Ankle Complete Right  07/28/2012  *RADIOLOGY REPORT*  Clinical Data: Ankle pain after fall.  RIGHT ANKLE - COMPLETE 3+ VIEW  Comparison: None.  Findings: There is no evidence of fracture or dislocation involving the ankle joint.  The joint space is intact.  There is a large bony abnormalities seen arising medially from the midfoot of unknown etiology.  IMPRESSION: No abnormality seen involving the right ankle joint.  However, large bony abnormality is seen arising medially from the midfoot; foot radiographs are recommended for further evaluation.   Original Report Authenticated By: Lupita Raider.,  M.D.    Dg Foot Complete Right  07/28/2012  *RADIOLOGY REPORT*  Clinical Data: Right foot pain after fall.  RIGHT FOOT COMPLETE - 3+ VIEW  Comparison: None.  Findings: There is a mildly displaced fracture involving the medial portion of the navicular bone.  Joint spaces appear intact.  No soft tissue abnormality is noted.  IMPRESSION: Mildly displaced fracture involving medial portion of tarsal navicular bone.   Original Report Authenticated By: Lupita Raider.,  M.D.      1. Navicular fracture, foot       MDM  Ortho referral given. Explained to pt important of follow-up.   I personally performed the services described in this documentation, which was scribed in my presence. The recorded information has been reviewed and is accurate.    Dorthula Matas, PA 07/28/12 2335

## 2012-08-04 ENCOUNTER — Encounter: Payer: Self-pay | Admitting: Obstetrics and Gynecology

## 2012-12-06 ENCOUNTER — Emergency Department (HOSPITAL_COMMUNITY)
Admission: EM | Admit: 2012-12-06 | Discharge: 2012-12-06 | Disposition: A | Discharge status: Home / Self Care | Payer: BC Managed Care – PPO | Attending: Emergency Medicine | Admitting: Emergency Medicine

## 2012-12-06 ENCOUNTER — Encounter (HOSPITAL_COMMUNITY): Payer: Self-pay | Admitting: Emergency Medicine

## 2012-12-06 DIAGNOSIS — R42 Dizziness and giddiness: Secondary | ICD-10-CM | POA: Insufficient documentation

## 2012-12-06 DIAGNOSIS — H538 Other visual disturbances: Secondary | ICD-10-CM | POA: Insufficient documentation

## 2012-12-06 DIAGNOSIS — R110 Nausea: Secondary | ICD-10-CM | POA: Insufficient documentation

## 2012-12-06 DIAGNOSIS — Z8742 Personal history of other diseases of the female genital tract: Secondary | ICD-10-CM | POA: Insufficient documentation

## 2012-12-06 DIAGNOSIS — H109 Unspecified conjunctivitis: Secondary | ICD-10-CM | POA: Insufficient documentation

## 2012-12-06 DIAGNOSIS — R6883 Chills (without fever): Secondary | ICD-10-CM | POA: Insufficient documentation

## 2012-12-06 DIAGNOSIS — E079 Disorder of thyroid, unspecified: Secondary | ICD-10-CM | POA: Insufficient documentation

## 2012-12-06 DIAGNOSIS — Z885 Allergy status to narcotic agent status: Secondary | ICD-10-CM | POA: Insufficient documentation

## 2012-12-06 DIAGNOSIS — Z79899 Other long term (current) drug therapy: Secondary | ICD-10-CM | POA: Insufficient documentation

## 2012-12-06 HISTORY — DX: Benign neoplasm of connective and other soft tissue, unspecified: D21.9

## 2012-12-06 MED ORDER — TOBRAMYCIN 0.3 % OP SOLN: 1.0000 [drp] | OPHTHALMIC | Status: DC

## 2012-12-06 MED ORDER — TOBRAMYCIN 0.3 % OP SOLN: 1.0000 [drp] | Freq: Four times a day (QID) | OPHTHALMIC | Status: DC

## 2012-12-06 NOTE — ED Notes (Signed)
PA aware of patient's elevated BP.

## 2012-12-06 NOTE — ED Notes (Signed)
Asked patient to put gown on and she refused to politely.

## 2012-12-06 NOTE — ED Provider Notes (Signed)
Complains of discomfort in left eye onset 4 days ago. She wakens with lashes stuck together in the morning.  Doug Sou, MD 12/06/12 [phone]

## 2012-12-06 NOTE — ED Provider Notes (Signed)
History     CSN: [id number]  Arrival date & time 12/06/12  1509   First MD Initiated Contact with Patient 12/06/12 1705      Chief Complaint  Patient presents with  . Conjunctivitis    (Consider location/radiation/quality/duration/timing/severity/associated sxs/prior treatment) The history is provided by the patient. No language interpreter was used.  Erin [name] is a 47 y/o F with PMHx of fibroids and thyroid disease presenting to the ED with an eye infection that has been ongoing since Monday of this week, stated that the discomfort is mainly in the left eye - stated that the left eye has been constantly tearing, clear tears, constantly itching with a mild aching sensation, stated that it is sore to touch the left eye, reported mild blurry vision. Reported that when she gets up in the mornings she has to pull her left eye apart due to debris accumulation. Patient reported that her right eye is starting to bother her, stated that she has a foreign body sensation in her eye that started on Wednesday, with accumulation of debris every morning. Patient reported that her daughter had an eye infection approximately 3 weeks ago - was diagnosed with viral conjunctivitis, and then her younger child had the eye infection shortly afterwards. Patient reported that she has been using Visine drops and compressions, both warm and cold - without much relief. Stated that she had mild chills the other day, reported nausea and dizziness yesterday. Denied fever, sore throat, nasal congestion, cough, chest pain, shortness of breath, difficulty breathing, gi symptoms.  PCP Dr. Renae Gloss  Past Medical History  Diagnosis Date  . Thyroid disease   . Fibroids     Past Surgical History  Procedure Laterality Date  . Cesarean section    . Appendectomy    . Cholecystectomy    . Tumor removal  fibroids  . Hernia repair      Family History  Problem Relation Age of Onset  . Hypertension Mother   . Cancer  Mother   . Hypertension Father   . Diabetes Father   . Cancer Father     History  Substance Use Topics  . Smoking status: Never Smoker   . Smokeless tobacco: Not on file  . Alcohol Use: No    OB History   Grav Para Term Preterm Abortions TAB SAB Ect Mult Living                  Review of Systems  Constitutional: Negative for fever, diaphoresis and fatigue.  HENT: Negative for congestion, sore throat, facial swelling, trouble swallowing and neck pain.   Eyes: Positive for pain, discharge, redness, itching and visual disturbance.  Respiratory: Negative for cough, chest tightness and shortness of breath.   Cardiovascular: Negative for chest pain.  Gastrointestinal: Positive for nausea. Negative for vomiting, abdominal pain and diarrhea.  Musculoskeletal: Negative for arthralgias.  Neurological: Positive for dizziness. Negative for weakness, light-headedness, numbness and headaches.  All other systems reviewed and are negative.    Allergies  Hydrocodone  Home Medications   Current Outpatient Rx  Name  Route  Sig  Dispense  Refill  . ergocalciferol (VITAMIN D2) 50000 UNITS capsule   Oral   Take 50,000 Units by mouth 2 (two) times a week. Monday and Wednesday.         . Fe Fum-FePoly-FA-Vit C-Vit B3 (INTEGRA F) 125-1 MG CAPS   Oral   Take 1 capsule by mouth daily.         [name]  ibuprofen (ADVIL,MOTRIN) 200 MG tablet   Oral   Take 600 mg by mouth every 6 (six) hours as needed for pain.         [name] levothyroxine (SYNTHROID, LEVOTHROID) 50 MCG tablet   Oral   Take 50 mcg by mouth daily.           . Soft Lens Products (SALINE) SOLN   Does not apply   2 drops by Does not apply route daily as needed (for eye problem).         [name] tobramycin (TOBREX) 0.3 % ophthalmic solution   Both Eyes   Place 1 drop into both eyes every 6 (six) hours.   5 mL   0     BP 148/79  Pulse 75  Temp(Src) 99 F (37.2 C) (Oral)  Resp 18  SpO2 100%  LMP 11/10/2012  Physical  Exam  Nursing note and vitals reviewed. Constitutional: She is oriented to person, place, and time. She appears well-developed and well-nourished. No distress.  HENT:  Head: Normocephalic and atraumatic.  Mouth/Throat: Oropharynx is clear and moist. No oropharyngeal exudate.  Negative pain upon palpation to the maxillary sinuses Mild discomfort upon palpation to the frontal sinuses, bilaterally Mild discomfort upon palpation to the left temple region  Eyes: EOM are normal. Pupils are equal, round, and reactive to light. No foreign bodies found. Right eye exhibits discharge. Right eye exhibits no exudate and no hordeolum. No foreign body present in the right eye. Left eye exhibits discharge. Left eye exhibits no chemosis, no exudate and no hordeolum. No foreign body present in the left eye. Right conjunctiva is not injected. Right conjunctiva has no hemorrhage. Left conjunctiva is injected. Left conjunctiva has no hemorrhage. Right eye exhibits normal extraocular motion and no nystagmus. Left eye exhibits normal extraocular motion and no nystagmus. Right pupil is round and reactive. Left pupil is round and reactive.  Swelling noted to the left eye - negative erythema PERRLA intact, consensual pupil reaction intact Mild discomfort upon palpation to the left eye Positive clear tearing noted  Conjunctiva injected to left eye Accumulation of discharge noted to both eyes  Negative lacrimal sac inflammation and swelling  Neck: Normal range of motion. Neck supple.  Cardiovascular: Normal rate, regular rhythm and normal heart sounds.  Exam reveals no friction rub.   No murmur heard. Pulses:      Radial pulses are 2+ on the right side, and 2+ on the left side.  Pulmonary/Chest: Effort normal and breath sounds normal. No respiratory distress. She has no wheezes. She has no rales.  Lymphadenopathy:    She has no cervical adenopathy.  Neurological: She is alert and oriented to person, place, and time.  No cranial nerve deficit. She exhibits normal muscle tone.  Cranial nerves III-XII grossly intact  Skin: Skin is warm and dry. No rash noted. She is not diaphoretic. No erythema.  Psychiatric: She has a normal mood and affect. Her behavior is normal. Thought content normal.    ED Course  Procedures (including critical care time)  Labs Reviewed - No data to display No results found.   1. Conjunctivitis   2. Thyroid disease       MDM  Patient presenting with eye pain, clear tears, discharge formation in the morning - sick contacts of conjunctivitis in family. Left conjunctiva injected with clear tears - negative emphyema or accumulation of fluid in lens - negative hemorrhage. Negative findings on visual acuity. Doubt pre-septal and post-septal cellulitis - mild  swelling noted, no inflammation. Discussed case with Dr. Alden Server, Dr. Alden Server saw and assessed patient - cleared patient for discharge with Tobramycin. Suspicion to be conjunctivitis - bacterial in nature. Patient stable, afebrile. Patient cleared for discharged. Discharged patient with tobramycin - discussed with patient course. Referred patient to ophthalmology to be follow-up early next week. Discussed with patient care of eye. Referred patient to PCP to get blood pressure re-checked, discussed with patient to decrease salt intake. Discussed with patient to continue to monitor symptoms and if symptoms are to worsen or change to report back to the ED - strict return instructions given.  Patient agreed to plan of care, understood, all questions answered.          Raymon Mutton, PA-C 12/07/12 [phone]

## 2012-12-06 NOTE — ED Notes (Signed)
Pt reports pink eye in left eye since Monday. Pt reports that both of her children had it and now she has it.

## 2012-12-07 NOTE — ED Provider Notes (Signed)
Medical screening examination/treatment/procedure(s) were conducted as a shared visit with non-physician practitioner(s) and myself.  I personally evaluated the patient during the encounter  Doug Sou, MD 12/07/12 2119

## 2013-04-15 ENCOUNTER — Other Ambulatory Visit: Payer: BC Managed Care – PPO

## 2013-04-15 ENCOUNTER — Telehealth: Payer: Self-pay | Admitting: Endocrinology

## 2013-04-15 NOTE — Telephone Encounter (Signed)
Pt did not come in for today's labs, she does have a follow up on the schedule still with Dr. Lucianne Muss for 04/16/13-Sherri

## 2013-04-16 ENCOUNTER — Ambulatory Visit: Payer: BC Managed Care – PPO | Admitting: Endocrinology

## 2013-04-16 DIAGNOSIS — Z0289 Encounter for other administrative examinations: Secondary | ICD-10-CM

## 2013-05-05 ENCOUNTER — Other Ambulatory Visit (INDEPENDENT_AMBULATORY_CARE_PROVIDER_SITE_OTHER): Payer: BC Managed Care – PPO

## 2013-05-05 ENCOUNTER — Other Ambulatory Visit: Payer: Self-pay | Admitting: *Deleted

## 2013-05-05 DIAGNOSIS — E0590 Thyrotoxicosis, unspecified without thyrotoxic crisis or storm: Secondary | ICD-10-CM

## 2013-05-06 LAB — T4, FREE: Free T4: 0.8 ng/dL (ref 0.60–1.60)

## 2013-05-06 LAB — TSH: TSH: 2.31 u[IU]/mL (ref 0.35–5.50)

## 2013-05-07 ENCOUNTER — Encounter: Payer: Self-pay | Admitting: Endocrinology

## 2013-05-07 ENCOUNTER — Ambulatory Visit (INDEPENDENT_AMBULATORY_CARE_PROVIDER_SITE_OTHER): Payer: BC Managed Care – PPO | Admitting: Endocrinology

## 2013-05-07 VITALS — BP 124/78 | HR 88 | Temp 98.5°F | Resp 12 | Ht 65.0 in | Wt 334.3 lb

## 2013-05-07 DIAGNOSIS — E890 Postprocedural hypothyroidism: Secondary | ICD-10-CM

## 2013-05-07 NOTE — Patient Instructions (Signed)
Continue same dosage before breakfast daily.   Avoid taking any calcium or iron supplements with the thyroid supplement. 

## 2013-05-07 NOTE — Progress Notes (Signed)
Reason for Appointment:  Hypothyroidism, followup visit    History of Present Illness:   The hypothyroidism was first diagnosed  after treatment of her Graves' disease, had I-131 treatment in 08/2008  Her previous records are not available and not clear at what stage she had developed hypothyroidism Her dose had been adjusted previously and probably has been taking a low dose of 50 mcg now for about a year She is usually consistent with taking her medication but if she misses a dose she will feel fatigue, cold sensation and achiness Complaints are reported by the patient now are none              Compliance with the medical regimen has been usually as prescribed with taking the tablet in the morning before breakfast. She takes her iron tablet about 2 hours later also has not taken it recently   Appointment on 05/05/2013  Component Date Value Range Status  . TSH 05/05/2013 2.31  0.35 - 5.50 uIU/mL Final  . Free T4 05/05/2013 0.80  0.60 - 1.60 ng/dL Final      Medication List       This list is accurate as of: 05/07/13  4:33 PM.  Always use your most recent med list.               ergocalciferol 50000 UNITS capsule  Commonly known as:  VITAMIN D2  Take 50,000 Units by mouth 2 (two) times a week. Monday and Wednesday.     ibuprofen 200 MG tablet  Commonly known as:  ADVIL,MOTRIN  Take 600 mg by mouth every 6 (six) hours as needed for pain.     INTEGRA F 125-1 MG Caps  Take 1 capsule by mouth daily.     levothyroxine 50 MCG tablet  Commonly known as:  SYNTHROID, LEVOTHROID  Take 50 mcg by mouth daily.     Saline Soln  2 drops by Does not apply route daily as needed (for eye problem).        Allergies:  Allergies  Allergen Reactions  . Hydrocodone Hives, Itching and Rash    On thighs     Past Medical History  Diagnosis Date  . Thyroid disease   . Fibroids     Past Surgical History  Procedure Laterality Date  . Cesarean section    . Appendectomy    .  Cholecystectomy    . Tumor removal  fibroids  . Hernia repair      Family History  Problem Relation Age of Onset  . Hypertension Mother   . Cancer Mother   . Hypertension Father   . Diabetes Father   . Cancer Father     Social History:  reports that she has never smoked. She does not have any smokeless tobacco history on file. She reports that she does not drink alcohol or use illicit drugs.  REVIEW Of SYSTEMS:   History of high deficiency anemia Not on treatment for  hypertension    Examination:   BP 124/78  Pulse 88  Temp(Src) 98.5 F (36.9 C)  Resp 12  Ht 5\' 5"  (1.651 m)  Wt 334 lb 4.8 oz (151.637 kg)  BMI 55.63 kg/m2  SpO2 97%  LMP 04/25/2013   GENERAL APPEARANCE: Alert And looks well.    FACE: No puffiness of face, hands or lower legs          NECK: no thyromegaly.          NEUROLOGIC EXAM: biceps reflexes show  normal relaxation    Assessments   Hypothyroidism, Post ablative She has only mild residual hypothyroidism with the requiring only 50 mcg Synthroid and good thyroid levels Discussed separating her iron tablet from her thyroid supplement   Treatment:   Continue same dosage before breakfast daily. Avoid taking any calcium or iron supplements with the thyroid supplement.   Followup in 6 months   Kipper Buch 05/07/2013, 4:33 PM

## 2013-05-25 ENCOUNTER — Other Ambulatory Visit: Payer: Self-pay | Admitting: *Deleted

## 2013-05-25 MED ORDER — LEVOTHYROXINE SODIUM 50 MCG PO TABS: 50.0000 ug | ORAL_TABLET | Freq: Every day | ORAL | Status: DC

## 2013-11-02 ENCOUNTER — Other Ambulatory Visit: Payer: BC Managed Care – PPO

## 2013-11-04 ENCOUNTER — Ambulatory Visit: Payer: BC Managed Care – PPO | Admitting: Endocrinology

## 2013-12-01 ENCOUNTER — Other Ambulatory Visit (INDEPENDENT_AMBULATORY_CARE_PROVIDER_SITE_OTHER): Payer: BC Managed Care – PPO

## 2013-12-01 DIAGNOSIS — E890 Postprocedural hypothyroidism: Secondary | ICD-10-CM

## 2013-12-02 LAB — TSH: TSH: 8.47 u[IU]/mL — ABNORMAL HIGH (ref 0.35–4.50)

## 2013-12-02 LAB — T4, FREE: Free T4: 0.68 ng/dL (ref 0.60–1.60)

## 2013-12-04 ENCOUNTER — Ambulatory Visit (INDEPENDENT_AMBULATORY_CARE_PROVIDER_SITE_OTHER): Payer: BC Managed Care – PPO | Admitting: Endocrinology

## 2013-12-04 ENCOUNTER — Encounter: Payer: Self-pay | Admitting: Endocrinology

## 2013-12-04 VITALS — BP 130/82 | HR 90 | Temp 97.8°F | Resp 16 | Ht 65.0 in | Wt 333.0 lb

## 2013-12-04 DIAGNOSIS — E890 Postprocedural hypothyroidism: Secondary | ICD-10-CM

## 2013-12-04 MED ORDER — LEVOTHYROXINE SODIUM 75 MCG PO TABS: 75.0000 ug | ORAL_TABLET | Freq: Every day | ORAL | Status: DC

## 2013-12-04 NOTE — Progress Notes (Signed)
Reason for Appointment:  Hypothyroidism, followup visit    History of Present Illness:   The hypothyroidism was first diagnosed  after treatment of her Graves' disease, had I-131 treatment in 08/2008  Her previous records are not available and not clear at what stage she had developed hypothyroidism Her dose had been adjusted previously and probably has been taking a low dose of 50 mcg now for about a year She thinks that with 75 mcg she had nausea She is usually consistent with taking her medication but if she misses a couple of tablets she will feel fatigue, cold sensation and achiness Complaints are reported by the patient now are none including any of the above symptoms              Compliance with the medical regimen has been usually as prescribed with taking the tablet in the morning before breakfast.  Does not take her iron tablets in the morning     Medication List       This list is accurate as of: 12/04/13  3:35 PM.  Always use your most recent med list.               Bilberry (Vaccinium myrtillus) 100 MG Caps  Take 100 mg by mouth.     ergocalciferol 50000 UNITS capsule  Commonly known as:  VITAMIN D2  Take 50,000 Units by mouth 2 (two) times a week. Monday and Wednesday.     ibuprofen 200 MG tablet  Commonly known as:  ADVIL,MOTRIN  Take 600 mg by mouth every 6 (six) hours as needed for pain.     INTEGRA F 125-1 MG Caps  Take 1 capsule by mouth daily.     levothyroxine 50 MCG tablet  Commonly known as:  SYNTHROID, LEVOTHROID  Take 1 tablet (50 mcg total) by mouth daily.     Saline Soln  2 drops by Does not apply route daily as needed (for eye problem).        Allergies:  Allergies  Allergen Reactions  . Hydrocodone Hives, Itching and Rash    On thighs     Past Medical History  Diagnosis Date  . Thyroid disease   . Fibroids     Past Surgical History  Procedure Laterality Date  . Cesarean section    . Appendectomy    . Cholecystectomy     . Tumor removal  fibroids  . Hernia repair      Family History  Problem Relation Age of Onset  . Hypertension Mother   . Cancer Mother   . Hypertension Father   . Diabetes Father   . Cancer Father     Social History:  reports that she has never smoked. She does not have any smokeless tobacco history on file. She reports that she does not drink alcohol or use illicit drugs.  REVIEW Of SYSTEMS:   History of high deficiency anemia Not on treatment for  hypertension    Examination:   BP 130/82  Pulse 90  Temp(Src) 97.8 F (36.6 C)  Resp 16  Ht 5\' 5"  (1.651 m)  Wt 333 lb (151.048 kg)  BMI 55.41 kg/m2  SpO2 95%   GENERAL APPEARANCE: Alert And looks well.    FACE: No puffiness of face, hands         NECK: no thyromegaly.          NEUROLOGIC EXAM: biceps and triceps reflexes show normal relaxation although difficult to elicit    Assessments  Hypothyroidism, Post ablative She has had  mild residual hypothyroidism with the requiring only 50 mcg Synthroid and previously good thyroid levels However even though she is appearing asymptomatic now her TSH is significantly high despite her good compliance with the supplement indicating some progression of her hypothyroidism   Treatment:   Increase levothyroxine to 75 mcg and followup in 2 months  Discussed importance of lifelong supplementation and regarding followup If she has any nausea with the higher dose she can try to take it with food but still separate it from her iron supplement  Kyleigha Markert 12/04/2013, 3:35 PM

## 2013-12-28 ENCOUNTER — Telehealth: Payer: Self-pay | Admitting: Endocrinology

## 2013-12-28 NOTE — Telephone Encounter (Signed)
Please see below and advise.

## 2013-12-28 NOTE — Telephone Encounter (Signed)
Her thyroid level was low on 50 mcg. If she is having trouble taking the 75 mcg she can try taking a half tablet twice a day

## 2013-12-28 NOTE — Telephone Encounter (Signed)
Patient would like her levothyroxine switched 50mg  to 75mg  Please switch back to 50mg  per patient   Please advise patient   Thank You

## 2013-12-28 NOTE — Telephone Encounter (Signed)
Noted, patient is aware. 

## 2014-01-27 ENCOUNTER — Other Ambulatory Visit: Payer: BC Managed Care – PPO

## 2014-01-28 ENCOUNTER — Other Ambulatory Visit (INDEPENDENT_AMBULATORY_CARE_PROVIDER_SITE_OTHER): Payer: BC Managed Care – PPO

## 2014-01-28 DIAGNOSIS — E890 Postprocedural hypothyroidism: Secondary | ICD-10-CM

## 2014-01-28 LAB — TSH: TSH: 0.53 u[IU]/mL (ref 0.35–4.50)

## 2014-02-03 ENCOUNTER — Ambulatory Visit (INDEPENDENT_AMBULATORY_CARE_PROVIDER_SITE_OTHER): Payer: BC Managed Care – PPO | Admitting: Endocrinology

## 2014-02-03 ENCOUNTER — Encounter: Payer: Self-pay | Admitting: Endocrinology

## 2014-02-03 VITALS — BP 107/69 | HR 88 | Temp 98.1°F | Resp 16 | Ht 65.0 in | Wt 335.4 lb

## 2014-02-03 DIAGNOSIS — E890 Postprocedural hypothyroidism: Secondary | ICD-10-CM

## 2014-02-03 NOTE — Patient Instructions (Signed)
Sundays: skip am dose only

## 2014-02-03 NOTE — Progress Notes (Signed)
Reason for Appointment:  Hypothyroidism, followup visit    History of Present Illness:   The hypothyroidism was first diagnosed  after treatment of her Graves' disease, had I-131 treatment in 08/2008  Her previous records are not available and not clear at what stage she had developed hypothyroidism Her dose had been adjusted previously and probably has been taking a low dose of 50 mcg now for about a year She thinks that with 75 mcg she had nausea She is usually consistent with taking her medication but if she misses a couple of tablets she will feel fatigue, cold sensation and achiness  On her last visit her TSH was significantly high at 8.47 despite being relatively compliant with her medication. She does not think she has unusual fatigue but has been having other issues currently that cause her to be tired. No cold intolerance          She was increased to 75 mcg but again she had nausea with this. However with taking a half tablet twice a day with food she has not had any nausea. Also no shakiness or palpitations Compliance with the medical regimen has been usually as prescribed with taking the tablet twice a day Does  take her iron tablets at lunchtime  Lab Results  Component Value Date   FREET4 0.68 12/01/2013   FREET4 0.80 05/05/2013   FREET4 0.87 02/05/2009   TSH 0.53 01/28/2014   TSH 8.47* 12/01/2013   TSH 2.31 05/05/2013   ]     Medication List       This list is accurate as of: 02/03/14  4:12 PM.  Always use your most recent med list.               Bilberry (Vaccinium myrtillus) 100 MG Caps  Take 100 mg by mouth.     cholecalciferol 1000 UNITS tablet  Commonly known as:  VITAMIN D  Take 4,000 Units by mouth daily.     ergocalciferol 50000 UNITS capsule  Commonly known as:  VITAMIN D2  Take 50,000 Units by mouth 2 (two) times a week. Monday and Wednesday.     ibuprofen 200 MG tablet  Commonly known as:  ADVIL,MOTRIN  Take 600 mg by mouth every 6 (six) hours  as needed for pain.     INTEGRA F 125-1 MG Caps  Take 1 capsule by mouth daily.     levothyroxine 75 MCG tablet  Commonly known as:  SYNTHROID, LEVOTHROID  Take 1 tablet (75 mcg total) by mouth daily.     Saline Soln  2 drops by Does not apply route daily as needed (for eye problem).        Allergies:  Allergies  Allergen Reactions  . Hydrocodone Hives, Itching and Rash    On thighs     Past Medical History  Diagnosis Date  . Thyroid disease   . Fibroids     Past Surgical History  Procedure Laterality Date  . Cesarean section    . Appendectomy    . Cholecystectomy    . Tumor removal  fibroids  . Hernia repair      Family History  Problem Relation Age of Onset  . Hypertension Mother   . Cancer Mother   . Hypertension Father   . Diabetes Father   . Cancer Father     Social History:  reports that she has never smoked. She does not have any smokeless tobacco history on file. She reports that she does not  drink alcohol or use illicit drugs.  REVIEW Of SYSTEMS:   History of iron deficiency anemia followed by PCP No history of diabetes or hypertension    Examination:   BP 107/69  Pulse 88  Temp(Src) 98.1 F (36.7 C)  Resp 16  Ht 5\' 5"  (1.651 m)  Wt 335 lb 6.4 oz (152.136 kg)  BMI 55.81 kg/m2  SpO2 97%   GENERAL APPEARANCE:  she looks well, No puffiness of face, hands        No peripheral edema    Assessments   Hypothyroidism, Post ablative She has had  mild residual hypothyroidism with the requiring more doses of iron supplements Recently her dose was increased to 75 mcg Although subjectively she did not feel any different her TSH is back to normal   Treatment:  Since her TSH is low normal at 0.53 will have her take 6-1/2 tablets a week and followup in 6 months She may continue to divide the dose of the 75 mcg twice a day to avoid nausea  Royalti Schauf 02/03/2014, 4:12 PM

## 2014-03-23 ENCOUNTER — Other Ambulatory Visit: Payer: Self-pay | Admitting: Endocrinology

## 2014-05-24 ENCOUNTER — Emergency Department (HOSPITAL_COMMUNITY)
Admission: EM | Admit: 2014-05-24 | Discharge: 2014-05-24 | Disposition: A | Discharge status: Home / Self Care | Payer: BC Managed Care – PPO | Attending: Emergency Medicine | Admitting: Emergency Medicine

## 2014-05-24 ENCOUNTER — Encounter (HOSPITAL_COMMUNITY): Payer: Self-pay | Admitting: Emergency Medicine

## 2014-05-24 DIAGNOSIS — Z79899 Other long term (current) drug therapy: Secondary | ICD-10-CM | POA: Diagnosis not present

## 2014-05-24 DIAGNOSIS — Z7952 Long term (current) use of systemic steroids: Secondary | ICD-10-CM | POA: Insufficient documentation

## 2014-05-24 DIAGNOSIS — E079 Disorder of thyroid, unspecified: Secondary | ICD-10-CM | POA: Insufficient documentation

## 2014-05-24 DIAGNOSIS — G4489 Other headache syndrome: Secondary | ICD-10-CM

## 2014-05-24 DIAGNOSIS — I10 Essential (primary) hypertension: Secondary | ICD-10-CM | POA: Diagnosis not present

## 2014-05-24 DIAGNOSIS — R51 Headache: Secondary | ICD-10-CM | POA: Diagnosis present

## 2014-05-24 LAB — I-STAT CHEM 8, ED
BUN: 12 mg/dL (ref 6–23)
Calcium, Ion: 1.18 mmol/L (ref 1.12–1.23)
Chloride: 103 mEq/L (ref 96–112)
Creatinine, Ser: 0.9 mg/dL (ref 0.50–1.10)
Glucose, Bld: 92 mg/dL (ref 70–99)
HCT: 34 % — ABNORMAL LOW (ref 36.0–46.0)
Hemoglobin: 11.6 g/dL — ABNORMAL LOW (ref 12.0–15.0)
Potassium: 3.6 mEq/L — ABNORMAL LOW (ref 3.7–5.3)
Sodium: 139 mEq/L (ref 137–147)
TCO2: 24 mmol/L (ref 0–100)

## 2014-05-24 MED ORDER — HYDROCHLOROTHIAZIDE 25 MG PO TABS: 25.0000 mg | ORAL_TABLET | Freq: Every day | ORAL | Status: DC

## 2014-05-24 NOTE — Discharge Instructions (Signed)

## 2014-05-24 NOTE — ED Notes (Signed)
Pt c/o HA x 3 days; pt sts pain worse when laying flat

## 2014-05-24 NOTE — ED Notes (Signed)
Pt st's headache started 4 days ago.  St's she has taken Advil without relief.  Pt alert and oriented x's 3.

## 2014-05-24 NOTE — ED Provider Notes (Signed)
CSN: [id number]     Arrival date & time 05/24/14  1302 History   First MD Initiated Contact with Patient 05/24/14 1559     Chief Complaint  Patient presents with  . Headache     (Consider location/radiation/quality/duration/timing/severity/associated sxs/prior Treatment) Patient is a 48 y.o. female presenting with headaches. The history is provided by the patient.  Headache Associated symptoms: no abdominal pain, no back pain, no diarrhea, no pain, no nausea, no neck stiffness, no numbness and no vomiting    patient presents with a headache for the last 4 days. It is dull and frontal. It is been constant. It may be somewhat worse with laying down. No chest pain. No Trouble breathing. She states she has no vision changes. She states the headache is improving somewhat now. No numbness or weakness. No confusion. No vision changes. Patient has had some thyroid issues in the past but recently her levels and good. Patient thinks the headache may be from putting too much salt on her chicken legs. Past Medical History  Diagnosis Date  . Thyroid disease   . Fibroids    Past Surgical History  Procedure Laterality Date  . Cesarean section    . Appendectomy    . Cholecystectomy    . Tumor removal  fibroids  . Hernia repair     Family History  Problem Relation Age of Onset  . Hypertension Mother   . Cancer Mother   . Hypertension Father   . Diabetes Father   . Cancer Father    History  Substance Use Topics  . Smoking status: Never Smoker   . Smokeless tobacco: Not on file  . Alcohol Use: No   OB History    No data available     Review of Systems  Constitutional: Negative for activity change and appetite change.  Eyes: Negative for pain.  Respiratory: Negative for chest tightness and shortness of breath.   Cardiovascular: Negative for chest pain and leg swelling.  Gastrointestinal: Negative for nausea, vomiting, abdominal pain and diarrhea.  Genitourinary: Negative for flank  pain.  Musculoskeletal: Negative for back pain and neck stiffness.  Skin: Negative for rash.  Neurological: Positive for headaches. Negative for weakness and numbness.  Psychiatric/Behavioral: Negative for behavioral problems.      Allergies  Hydrocodone  Home Medications   Prior to Admission medications   Medication Sig Start Date End Date Taking? Authorizing Provider  Bilberry, Vaccinium myrtillus, 100 MG CAPS Take 100 mg by mouth.   Yes Historical Provider, MD  ergocalciferol (VITAMIN D2) 50000 UNITS capsule Take 50,000 Units by mouth 2 (two) times a week. Monday and Wednesday.   Yes Historical Provider, MD  Fe Fum-FePoly-FA-Vit C-Vit B3 (INTEGRA F) 125-1 MG CAPS Take 1 capsule by mouth daily.   Yes Historical Provider, MD  ibuprofen (ADVIL,MOTRIN) 200 MG tablet Take 600 mg by mouth every 6 (six) hours as needed for pain.   Yes Historical Provider, MD  levothyroxine (SYNTHROID, LEVOTHROID) 75 MCG tablet TAKE 1 TABLET (75 MCG TOTAL) BY MOUTH DAILY. 03/23/14  Yes Elayne Snare, MD  Soft Lens Products (SALINE) SOLN 2 drops by Does not apply route daily as needed (for eye problem).   Yes Historical Provider, MD  hydrochlorothiazide (HYDRODIURIL) 25 MG tablet Take 1 tablet (25 mg total) by mouth daily. 05/24/14   Jasper Riling. Gyasi Hazzard, MD   BP 126/86 mmHg  Pulse 83  Temp(Src) 98.2 F (36.8 C) (Oral)  Resp 15  SpO2 100% Physical Exam  Constitutional: She  is oriented to person, place, and time. She appears well-developed and well-nourished.  Patient is obese  HENT:  Head: Normocephalic and atraumatic.  Eyes: EOM are normal. Pupils are equal, round, and reactive to light.  Neck: Normal range of motion. Neck supple.  Cardiovascular: Normal rate, regular rhythm and normal heart sounds.   No murmur heard. Pulmonary/Chest: Effort normal and breath sounds normal. No respiratory distress. She has no wheezes. She has no rales.  Abdominal: Soft. Bowel sounds are normal. She exhibits no  distension. There is no tenderness. There is no rebound and no guarding.  Musculoskeletal: Normal range of motion. She exhibits no edema.  Neurological: She is alert and oriented to person, place, and time. No cranial nerve deficit.  Skin: Skin is warm and dry.  Psychiatric: She has a normal mood and affect. Her speech is normal.  Nursing note and vitals reviewed.   ED Course  Procedures (including critical care time) Labs Review Labs Reviewed  I-STAT CHEM 8, ED - Abnormal; Notable for the following:    Potassium 3.6 (*)    Hemoglobin 11.6 (*)    HCT 34.0 (*)    All other components within normal limits    Imaging Review No results found.   EKG Interpretation None      MDM   Final diagnoses:  Other headache syndrome  Essential hypertension    Patient with headache. May be related to blood pressure. Blood pressures come down some. Is hypertensive. Will start on hydrochlorothiazide and have follow-up with her primary care doctor.    Jasper Riling. Alvino Chapel, MD 05/25/14 2318

## 2014-05-24 NOTE — ED Notes (Addendum)
Pt resting quietly at this time. Family at bedside.

## 2014-08-03 ENCOUNTER — Other Ambulatory Visit (INDEPENDENT_AMBULATORY_CARE_PROVIDER_SITE_OTHER): Payer: BLUE CROSS/BLUE SHIELD

## 2014-08-03 DIAGNOSIS — E890 Postprocedural hypothyroidism: Secondary | ICD-10-CM

## 2014-08-03 LAB — T4, FREE: Free T4: 0.75 ng/dL (ref 0.60–1.60)

## 2014-08-03 LAB — TSH: TSH: 20.91 u[IU]/mL — ABNORMAL HIGH (ref 0.35–4.50)

## 2014-08-06 ENCOUNTER — Encounter: Payer: Self-pay | Admitting: Endocrinology

## 2014-08-06 ENCOUNTER — Ambulatory Visit (INDEPENDENT_AMBULATORY_CARE_PROVIDER_SITE_OTHER): Payer: BLUE CROSS/BLUE SHIELD | Admitting: Endocrinology

## 2014-08-06 VITALS — BP 116/91 | HR 90 | Temp 98.5°F | Resp 16 | Ht 65.0 in | Wt 346.0 lb

## 2014-08-06 DIAGNOSIS — E890 Postprocedural hypothyroidism: Secondary | ICD-10-CM

## 2014-08-06 MED ORDER — LEVOTHYROXINE SODIUM 112 MCG PO TABS: 112.0000 ug | ORAL_TABLET | Freq: Every day | ORAL | Status: DC

## 2014-08-06 NOTE — Patient Instructions (Signed)
75 dose take 1/2,  3x daily  112ug dose 1/2 twice daily

## 2014-08-06 NOTE — Progress Notes (Signed)
Erin [name] 49 y.o.   Reason for Appointment:  Hypothyroidism, followup visit    History of Present Illness:   The hypothyroidism was first diagnosed  after treatment of her Graves' disease, had I-131 treatment in 08/2008  Her dose had been adjusted previously and she had been taking a lower dose of 50 g for some time with stable levels However in 6/15 her TSH was significantly high at 8.47 despite being compliant with her medication. With increasing her dose to 75 g she did not seem to have any significant change in her energy level She tends to stay tired but has other conditions causing fatigue also She gets nausea with the full tablet of 75 g but with taking a half tablet twice a day with food she has not had any nausea.  Although her TSH was normal in 01/2014 it appears to be significantly high now  Compliance with the medical regimen has been usually as prescribed with taking the tablet twice a day Does  take her iron tablets at lunchtime She is not complaining of any fatigue recently more than usual or cold intolerance  Lab Results  Component Value Date   TSH 20.91* 08/03/2014   TSH 0.53 01/28/2014   TSH 8.47* 12/01/2013   FREET4 0.75 08/03/2014   FREET4 0.68 12/01/2013   FREET4 0.80 05/05/2013    Wt Readings from Last 3 Encounters:  08/06/14 346 lb (156.945 kg)  02/03/14 335 lb 6.4 oz (152.136 kg)  12/04/13 333 lb (151.048 kg)       Medication List       This list is accurate as of: 08/06/14  4:46 PM.  Always use your most recent med list.               Bilberry (Vaccinium myrtillus) 100 MG Caps  Take 100 mg by mouth.     ergocalciferol 50000 UNITS capsule  Commonly known as:  VITAMIN D2  Take 50,000 Units by mouth 2 (two) times a week. Monday and Wednesday.     hydrochlorothiazide 25 MG tablet  Commonly known as:  HYDRODIURIL  Take 1 tablet (25 mg total) by mouth daily.     ibuprofen 200 MG tablet  Commonly known as:  ADVIL,MOTRIN    Take 600 mg by mouth every 6 (six) hours as needed for pain.     INTEGRA F 125-1 MG Caps  Take 1 capsule by mouth daily.     levothyroxine 75 MCG tablet  Commonly known as:  SYNTHROID, LEVOTHROID  TAKE 1 TABLET (75 MCG TOTAL) BY MOUTH DAILY.     Saline Soln  2 drops by Does not apply route daily as needed (for eye problem).        Allergies:  Allergies  Allergen Reactions  . Hydrocodone Hives, Itching and Rash    On thighs     Past Medical History  Diagnosis Date  . Thyroid disease   . Fibroids     Past Surgical History  Procedure Laterality Date  . Cesarean section    . Appendectomy    . Cholecystectomy    . Tumor removal  fibroids  . Hernia repair      Family History  Problem Relation Age of Onset  . Hypertension Mother   . Cancer Mother   . Hypertension Father   . Diabetes Father   . Cancer Father     Social History:  reports that she has never smoked. She does not have any smokeless tobacco  history on file. She reports that she does not drink alcohol or use illicit drugs.  REVIEW Of SYSTEMS:   History of iron deficiency anemia followed by PCP No history of diabetes or hypertension    Examination:   BP 116/91 mmHg  Pulse 90  Temp(Src) 98.5 F (36.9 C)  Resp 16  Ht 5\' 5"  (1.651 m)  Wt 346 lb (156.945 kg)  BMI 57.58 kg/m2  SpO2 97%   GENERAL APPEARANCE:  she looks well, No puffiness of face, hands        Biceps reflexes appear to be showing normal relaxation    Assessments   Hypothyroidism, Post ablative She previously has had  mild residual hypothyroidism but recently appears to be requiring progressively higher doses of supplements Even though her dose was increased to 75 mcg in 6/15 with good results she has a much higher TSH now She does have some nonspecific fatigue and not clear if she is symptomatic from this   Treatment:  Since her TSH is significantly high she will increase her dose to 112 g daily instead of 75 She may  prefer to take this half tablet twice a she thinks she has intolerance with a whole tablets  She will need to follow-up in 2 months   Abhay Godbolt 08/06/2014, 4:46 PM

## 2014-10-04 ENCOUNTER — Other Ambulatory Visit: Payer: BLUE CROSS/BLUE SHIELD

## 2014-10-07 ENCOUNTER — Telehealth: Payer: Self-pay | Admitting: Endocrinology

## 2014-10-07 ENCOUNTER — Ambulatory Visit: Payer: BLUE CROSS/BLUE SHIELD | Admitting: Endocrinology

## 2014-10-07 DIAGNOSIS — Z0289 Encounter for other administrative examinations: Secondary | ICD-10-CM

## 2014-10-07 NOTE — Telephone Encounter (Signed)
Patient no showed today's appt. Please advise on how to follow up. °A. No follow up necessary. °B. Follow up urgent. Contact patient immediately. °C. Follow up necessary. Contact patient and schedule visit in ___ days. °D. Follow up advised. Contact patient and schedule visit in ____weeks. ° °

## 2014-10-13 LAB — HM PAP SMEAR

## 2014-10-20 NOTE — Telephone Encounter (Signed)
She will need to follow-up as soon as possible

## 2014-11-08 ENCOUNTER — Other Ambulatory Visit: Payer: BLUE CROSS/BLUE SHIELD

## 2014-11-11 ENCOUNTER — Ambulatory Visit: Payer: BLUE CROSS/BLUE SHIELD | Admitting: Endocrinology

## 2014-11-17 ENCOUNTER — Other Ambulatory Visit (INDEPENDENT_AMBULATORY_CARE_PROVIDER_SITE_OTHER): Payer: BLUE CROSS/BLUE SHIELD

## 2014-11-17 DIAGNOSIS — E890 Postprocedural hypothyroidism: Secondary | ICD-10-CM | POA: Diagnosis not present

## 2014-11-17 LAB — TSH: TSH: 4.48 u[IU]/mL (ref 0.35–4.50)

## 2014-11-17 LAB — T4, FREE: Free T4: 0.81 ng/dL (ref 0.60–1.60)

## 2014-11-19 ENCOUNTER — Encounter: Payer: Self-pay | Admitting: Endocrinology

## 2014-11-19 ENCOUNTER — Ambulatory Visit (INDEPENDENT_AMBULATORY_CARE_PROVIDER_SITE_OTHER): Payer: BLUE CROSS/BLUE SHIELD | Admitting: Endocrinology

## 2014-11-19 VITALS — BP 118/76 | HR 94 | Temp 98.3°F | Resp 16 | Ht 65.0 in | Wt 345.0 lb

## 2014-11-19 DIAGNOSIS — E890 Postprocedural hypothyroidism: Secondary | ICD-10-CM

## 2014-11-19 MED ORDER — LEVOTHYROXINE SODIUM 112 MCG PO TABS: 112.0000 ug | ORAL_TABLET | Freq: Every day | ORAL | Status: DC

## 2014-11-19 NOTE — Patient Instructions (Signed)
Try taking full tab in am before eating

## 2014-11-19 NOTE — Progress Notes (Signed)
Erin [name] 49 y.o.   Reason for Appointment:  Hypothyroidism, followup visit    History of Present Illness:   The hypothyroidism was first diagnosed  after treatment of her Graves' disease, had I-131 treatment in 08/2008  Her dose had been adjusted previously and she had been taking a lower dose of 50 g for some time with stable levels However in 6/15 her TSH was significantly high at 8.47 despite being compliant with her medication. With increasing her dose to 75 g she did not seem to have any significant change in her energy level She tends to stay tired but has other conditions causing fatigue also She gets nausea with the full tablet of 75 g but with taking a half tablet twice a day with food she has not had any nausea.  Although her TSH was normal in 01/2014 it significantly higher in 2/16 She did not complain of any fatigue but after increasing her dose to 112 g she has a little more energy  Compliance with the medical regimen has been usually as prescribed with taking a half tablet twice a day; the dose was split to twice a day because of her review his symptoms of nausea with taking the whole tablet.  However she is taking the evening tablet at variable times and either before supper or bedtime Does not take any iron currently  She is not complaining of any fatigue recently  TSH is normal   Lab Results  Component Value Date   TSH 4.48 11/17/2014   TSH 20.91* 08/03/2014   TSH 0.53 01/28/2014   FREET4 0.81 11/17/2014   FREET4 0.75 08/03/2014   FREET4 0.68 12/01/2013    Wt Readings from Last 3 Encounters:  11/19/14 345 lb (156.491 kg)  08/06/14 346 lb (156.945 kg)  02/03/14 335 lb 6.4 oz (152.136 kg)       Medication List       This list is accurate as of: 11/19/14  4:48 PM.  Always use your most recent med list.               Bilberry (Vaccinium myrtillus) 100 MG Caps  Take 100 mg by mouth.     ergocalciferol 50000 UNITS capsule  Commonly  known as:  VITAMIN D2  Take 50,000 Units by mouth 2 (two) times a week. Monday and Wednesday.     ibuprofen 200 MG tablet  Commonly known as:  ADVIL,MOTRIN  Take 600 mg by mouth every 6 (six) hours as needed for pain.     INTEGRA F 125-1 MG Caps  Take 1 capsule by mouth daily.     levothyroxine 112 MCG tablet  Commonly known as:  SYNTHROID, LEVOTHROID  Take 1 tablet (112 mcg total) by mouth daily.     Saline Soln  2 drops by Does not apply route daily as needed (for eye problem).        Allergies:  Allergies  Allergen Reactions  . Hydrocodone Hives, Itching and Rash    On thighs     Past Medical History  Diagnosis Date  . Thyroid disease   . Fibroids     Past Surgical History  Procedure Laterality Date  . Cesarean section    . Appendectomy    . Cholecystectomy    . Tumor removal  fibroids  . Hernia repair      Family History  Problem Relation Age of Onset  . Hypertension Mother   . Cancer Mother   . Hypertension  Father   . Diabetes Father   . Cancer Father     Social History:  reports that she has never smoked. She does not have any smokeless tobacco history on file. She reports that she does not drink alcohol or use illicit drugs.  REVIEW Of SYSTEMS:   History of iron deficiency anemia followed by PCP No history of diabetes  Has had periodic increase in blood pressure but not on medications for hypertension at this time, she thinks she has some white coat syndrome    Examination:   BP 126/90 mmHg  Pulse 94  Temp(Src) 98.3 F (36.8 C)  Resp 16  Ht 5\' 5"  (1.651 m)  Wt 345 lb (156.491 kg)  BMI 57.41 kg/m2  SpO2 98%   GENERAL APPEARANCE:  she looks well, No puffiness of face, hands        Biceps reflexes appear to be showing normal relaxation Repeat blood pressure 118/76    Assessments   Hypothyroidism, Post ablative She has gradually required higher doses of thyroid supplement since 2015 Currently TSH is back to normal with 112 g She is  doing well subjectively also Compliance is good with her medication, currently taking the dose half tablet twice a day because of nausea with the full tablet However not clear if she is getting consistent absorption with the evening dose which is taken at variable times   Treatment:  Since her TSH is high normal she can try taking the whole tablet before breakfast for better absorption She will need to follow-up in 6 months  Min Tunnell 11/19/2014, 4:48 PM

## 2015-05-16 ENCOUNTER — Other Ambulatory Visit: Payer: BLUE CROSS/BLUE SHIELD

## 2015-05-17 ENCOUNTER — Ambulatory Visit (INDEPENDENT_AMBULATORY_CARE_PROVIDER_SITE_OTHER): Payer: BLUE CROSS/BLUE SHIELD | Admitting: Internal Medicine

## 2015-05-17 ENCOUNTER — Encounter: Payer: Self-pay | Admitting: Internal Medicine

## 2015-05-17 VITALS — BP 112/76 | HR 85 | Temp 98.8°F | Resp 18 | Wt 330.0 lb

## 2015-05-17 DIAGNOSIS — R079 Chest pain, unspecified: Secondary | ICD-10-CM | POA: Diagnosis not present

## 2015-05-17 DIAGNOSIS — M5416 Radiculopathy, lumbar region: Secondary | ICD-10-CM

## 2015-05-17 DIAGNOSIS — M5442 Lumbago with sciatica, left side: Secondary | ICD-10-CM | POA: Diagnosis not present

## 2015-05-17 DIAGNOSIS — D649 Anemia, unspecified: Secondary | ICD-10-CM

## 2015-05-17 DIAGNOSIS — E890 Postprocedural hypothyroidism: Secondary | ICD-10-CM

## 2015-05-17 DIAGNOSIS — M545 Low back pain, unspecified: Secondary | ICD-10-CM | POA: Insufficient documentation

## 2015-05-17 DIAGNOSIS — G8929 Other chronic pain: Secondary | ICD-10-CM | POA: Insufficient documentation

## 2015-05-17 DIAGNOSIS — R002 Palpitations: Secondary | ICD-10-CM

## 2015-05-17 DIAGNOSIS — R0683 Snoring: Secondary | ICD-10-CM

## 2015-05-17 DIAGNOSIS — K429 Umbilical hernia without obstruction or gangrene: Secondary | ICD-10-CM | POA: Insufficient documentation

## 2015-05-17 MED ORDER — METAXALONE 800 MG PO TABS: 800.0000 mg | ORAL_TABLET | Freq: Three times a day (TID) | ORAL | Status: DC

## 2015-05-17 MED ORDER — MELOXICAM 15 MG PO TABS: 15.0000 mg | ORAL_TABLET | Freq: Every day | ORAL | Status: DC

## 2015-05-17 MED ORDER — GABAPENTIN 300 MG PO CAPS: 300.0000 mg | ORAL_CAPSULE | Freq: Every day | ORAL | Status: DC

## 2015-05-17 NOTE — Assessment & Plan Note (Signed)
Following with endocrine-they are monitoring her blood work and adjusting her medication

## 2015-05-17 NOTE — Progress Notes (Signed)
Subjective:    Patient ID: Erin [name], female    DOB: 1966/03/28, 49 y.o.   MRN: [id number]  HPI She is here to establish with a new primary care physician. She has concerns regarding back pain  Back pain: 2 weeks ago she turned toward her right twisted and had central lower back pain. The pain has persisted over the past 2 weeks and at some point she developed pain radiating down her left leg toward left calf. She has tingling in the left leg and weakness in the right leg when she stands or walks. She is experiencing spasms in the lower back. She has had back pain in the past, which she typically has felt when she goes to sit or stand or stands for too long. Typically Advil or Aleve helps, but it is not helping this time-helps for only short duration, but pain has not improved. She has never had pain radiating down her leg. She denies any change in urinary or bowel habits-no incontinence.  She has never done physical therapy or seen a back specialist.  She did have lower back pain imaging years ago.  "Pre-syncope":  She states she has been experiencing pre-fainting episodes in the past year. She thinks it started approximately one year ago and she has not seen anyone for this or had any testing done. She feels herself having trouble breathing when she is sleeping and wakes up.  This mainly occurs when she is sleeping and it wakes her up.  She drinks cold water and it subsides.  She denies daytime symptoms.  She snores.  Her husband denies apnea.  She does have chronic fatigue issues, but has 8 living children and her youngest has special needs.  She also states she has been experiencing occasional chest pain and palpitations.  Hypothyroidism: She is following with endocrine. She is taking her medication daily as prescribed.  Medications and allergies reviewed with patient and updated if appropriate.  Patient Active Problem List   Diagnosis Date Noted  . Morbid obesity ([place]) 05/17/2015    . Umbilical hernia [id number]  . ANEMIA 09/03/2007  . Hypothyroidism following radioiodine therapy 09/02/2007  . INTERSTITIAL CYSTITIS 09/01/2007  . HYPERTENSION, BENIGN ESSENTIAL 08/07/2007  . ALLERGIC RHINITIS 08/07/2007  . [place] DISEASE, LUMBAR 08/07/2007    Current Outpatient Prescriptions on File Prior to Visit  Medication Sig Dispense Refill  . ergocalciferol (VITAMIN D2) 50000 UNITS capsule Take 50,000 Units by mouth 2 (two) times a week. Monday and Wednesday.    [name] ibuprofen (ADVIL,MOTRIN) 200 MG tablet Take 600 mg by mouth every 6 (six) hours as needed for pain.    [name] levothyroxine (SYNTHROID, LEVOTHROID) 112 MCG tablet Take 1 tablet (112 mcg total) by mouth daily. 30 tablet 5  . Soft Lens Products (SALINE) SOLN 2 drops by Does not apply route daily as needed (for eye problem).     No current facility-administered medications on file prior to visit.    Past Medical History  Diagnosis Date  . Thyroid disease   . Fibroids     Past Surgical History  Procedure Laterality Date  . Cesarean section    . Appendectomy    . Cholecystectomy    . Tumor removal  fibroids  . Hernia repair      Social History   Social History  . Marital Status: Married    Spouse Name: N/A  . Number of Children: N/A  . Years of Education: N/A   Social History Main  Topics  . Smoking status: Never Smoker   . Smokeless tobacco: None  . Alcohol Use: No  . Drug Use: No  . Sexual Activity: Not Asked   Other Topics Concern  . None   Social History Narrative    Review of Systems  Constitutional: Negative for fever and chills.  Respiratory: Positive for shortness of breath (with exertion).   Cardiovascular: Positive for chest pain (occasional - ? heartburn), palpitations and leg swelling.  Gastrointestinal: Positive for constipation (occaisonal). Negative for nausea, abdominal pain, diarrhea and blood in stool.       GERD 2-3 times a week  Genitourinary: Negative for dysuria and hematuria.        No urinary or stool incontinence  Musculoskeletal: Positive for back pain.  Neurological: Positive for syncope, weakness and numbness.  Psychiatric/Behavioral: Positive for dysphoric mood. The patient is nervous/anxious.        Objective:   Filed Vitals:   05/17/15 1412  BP: 112/76  Pulse: 85  Temp: 98.8 F (37.1 C)  Resp: 18   Filed Weights   05/17/15 1412  Weight: 330 lb (149.687 kg)   Body mass index is 54.91 kg/(m^2).   Physical Exam  Constitutional: She is oriented to person, place, and time. She appears well-developed and well-nourished. No distress.  Morbidly obese  HENT:  Head: Normocephalic and atraumatic.  Right Ear: External ear normal.  Left Ear: External ear normal.  Eyes: Conjunctivae are normal.  Neck: Neck supple. No tracheal deviation present. No thyromegaly present.  No carotid bruit  Cardiovascular: Normal rate, regular rhythm and normal heart sounds.   No murmur heard. Pulmonary/Chest: Effort normal and breath sounds normal. No respiratory distress. She has no wheezes. She has no rales.  Abdominal: Soft. She exhibits no distension. There is no tenderness.  Musculoskeletal: She exhibits edema (mild bilateral).  Pain with palpation central lower back and left lower back  Lymphadenopathy:    She has no cervical adenopathy.  Neurological: She is alert and oriented to person, place, and time.  Decreased sensation left lower extremity, normal strength all extremities  Skin: Skin is warm and dry. She is not diaphoretic.  Psychiatric: She has a normal mood and affect. Her behavior is normal.          Assessment & Plan:   See Problem List.  Follow-up to be determined by blood work

## 2015-05-17 NOTE — Assessment & Plan Note (Signed)
Has symptoms once or twice a week Check echocardiogram Check basic blood work Consider holter - may need longer than 48 hr holter so would need to refer to cardio

## 2015-05-17 NOTE — Assessment & Plan Note (Signed)
History of anemia Still menstruating Check CBC, iron levels

## 2015-05-17 NOTE — Assessment & Plan Note (Signed)
History of chronic lower back pain that is intermittent, but now she is experiencing lumbar radiculopathy She deferred physical therapy We'll try gabapentin at night Meloxicam 15 mg strength a day-advised her not to take any other NSAIDs Skelaxin 3 times daily as needed for muscle spasms Avoid twisting, lifting If no improvement she will let me know. Consider physical therapy or orthopedic referral

## 2015-05-17 NOTE — Assessment & Plan Note (Signed)
Will order an echocardiogram We'll need to consider cardiology referral and possible stress test

## 2015-05-17 NOTE — Assessment & Plan Note (Signed)
Stressed increasing her exercise, decreasing portions and working on weight loss

## 2015-05-17 NOTE — Assessment & Plan Note (Signed)
She does snore and states difficulty breathing at night that wakes her up. Probable sleep apnea We'll refer to pulmonary for sleep study Stressed weight loss Discussed potential consequences of untreated sleep apnea

## 2015-05-17 NOTE — Progress Notes (Signed)
Pre visit review using our clinic review tool, if applicable. No additional management support is needed unless otherwise documented below in the visit note. 

## 2015-05-17 NOTE — Patient Instructions (Addendum)
  We have reviewed your prior records including labs and tests today.  Test(s) ordered today. Your results will be released to [facility] (or called to you) after review, usually within 72hours after test completion. If any changes need to be made, you will be notified at that same time.  Your prescription(s) have been submitted to your pharmacy. Please take as directed and contact our office if you believe you are having problem(s) with the medication(s).  An echo was ordered.  A referral was ordered for pulmonary.

## 2015-05-23 ENCOUNTER — Other Ambulatory Visit: Payer: Self-pay | Admitting: *Deleted

## 2015-05-23 MED ORDER — LEVOTHYROXINE SODIUM 112 MCG PO TABS: 112.0000 ug | ORAL_TABLET | Freq: Every day | ORAL | Status: DC

## 2015-05-24 ENCOUNTER — Telehealth (HOSPITAL_COMMUNITY): Payer: Self-pay | Admitting: *Deleted

## 2015-05-27 ENCOUNTER — Ambulatory Visit: Payer: BLUE CROSS/BLUE SHIELD | Admitting: Endocrinology

## 2015-06-14 ENCOUNTER — Ambulatory Visit: Payer: BLUE CROSS/BLUE SHIELD | Admitting: Internal Medicine

## 2015-06-14 ENCOUNTER — Other Ambulatory Visit: Payer: Self-pay | Admitting: Endocrinology

## 2015-07-13 ENCOUNTER — Other Ambulatory Visit: Payer: Self-pay | Admitting: Endocrinology

## 2015-07-22 ENCOUNTER — Other Ambulatory Visit: Payer: BLUE CROSS/BLUE SHIELD

## 2015-07-25 ENCOUNTER — Other Ambulatory Visit (INDEPENDENT_AMBULATORY_CARE_PROVIDER_SITE_OTHER): Payer: BLUE CROSS/BLUE SHIELD

## 2015-07-25 ENCOUNTER — Telehealth: Payer: Self-pay | Admitting: Endocrinology

## 2015-07-25 ENCOUNTER — Other Ambulatory Visit: Payer: Self-pay | Admitting: *Deleted

## 2015-07-25 DIAGNOSIS — E890 Postprocedural hypothyroidism: Secondary | ICD-10-CM

## 2015-07-25 LAB — T4, FREE: Free T4: 1.07 ng/dL (ref 0.60–1.60)

## 2015-07-25 LAB — TSH: TSH: 0.22 u[IU]/mL — ABNORMAL LOW (ref 0.35–4.50)

## 2015-07-25 MED ORDER — LEVOTHYROXINE SODIUM 112 MCG PO TABS: ORAL_TABLET | ORAL | Status: DC

## 2015-07-25 NOTE — Telephone Encounter (Signed)
rx sent for a 15 day supply, no further refills until she's seen in the office.

## 2015-07-25 NOTE — Telephone Encounter (Signed)
Patient need a refill of levothyroxine send  [id number] - [place], [place] - [address] [id number] (Phone) [phone] (Fax)

## 2015-08-05 ENCOUNTER — Encounter: Payer: Self-pay | Admitting: Endocrinology

## 2015-08-05 ENCOUNTER — Ambulatory Visit (INDEPENDENT_AMBULATORY_CARE_PROVIDER_SITE_OTHER): Payer: BLUE CROSS/BLUE SHIELD | Admitting: Endocrinology

## 2015-08-05 VITALS — BP 120/74 | HR 93 | Temp 99.2°F | Resp 20 | Ht 65.0 in | Wt 327.0 lb

## 2015-08-05 DIAGNOSIS — E890 Postprocedural hypothyroidism: Secondary | ICD-10-CM

## 2015-08-05 MED ORDER — LEVOTHYROXINE SODIUM 112 MCG PO TABS
112.0000 ug | ORAL_TABLET | Freq: Every day | ORAL | Status: DC
Start: 2015-08-05 — End: 2016-03-14

## 2015-08-05 NOTE — Progress Notes (Signed)
Erin [name] 50 y.o.   Reason for Appointment:  Hypothyroidism, followup visit    History of Present Illness:   The hypothyroidism was first diagnosed  after treatment of her Graves' disease, had I-131 treatment in 08/2008  Her dose had been adjusted previously and she had been taking a lower dose of 50 g for some time with stable levels However in 6/15 her TSH was significantly high at 8.47 despite being compliant with her medication. With increasing her dose to 75 g she did not seem to have any significant change in her energy level  Although her TSH was normal in 01/2014 it significantly higher in 2/16 With  increasing her dose to 112 g she has had a little more energy Her weight tends to fluctuate but overall lower now  Even though she was having some nausea with initial treatment doses she is now used to taking the full dose of the medication before breakfast daily without any nausea now.  Compliance with the medical regimen has been good recently Does not take any iron currently  She is not complaining of any fatigue  Her TSH is however slightly below normal now without much change in free T4   Lab Results  Component Value Date   TSH 0.22* 07/25/2015   TSH 4.48 11/17/2014   TSH 20.91* 08/03/2014   FREET4 1.07 07/25/2015   FREET4 0.81 11/17/2014   FREET4 0.75 08/03/2014    Wt Readings from Last 3 Encounters:  08/05/15 327 lb (148.326 kg)  05/17/15 330 lb (149.687 kg)  11/19/14 345 lb (156.491 kg)       Medication List       This list is accurate as of: 08/05/15  4:27 PM.  Always use your most recent med list.               amoxicillin-clavulanate 875-125 MG tablet  Commonly known as:  AUGMENTIN  Take 1 tablet by mouth 2 (two) times daily.     ergocalciferol 50000 units capsule  Commonly known as:  VITAMIN D2  Take 50,000 Units by mouth 2 (two) times a week. Monday and Wednesday.     ibuprofen 200 MG tablet  Commonly known as:   ADVIL,MOTRIN  Take 600 mg by mouth every 6 (six) hours as needed for pain.     levothyroxine 112 MCG tablet  Commonly known as:  SYNTHROID, LEVOTHROID  Take 1 tablet (112 mcg total) by mouth daily.     Saline Soln  2 drops by Does not apply route daily as needed (for eye problem).        Allergies:  Allergies  Allergen Reactions  . Hydrocodone Hives, Itching and Rash    On thighs     Past Medical History  Diagnosis Date  . Thyroid disease   . Fibroids     Past Surgical History  Procedure Laterality Date  . Cesarean section    . Appendectomy    . Cholecystectomy    . Tumor removal  fibroids  . Hernia repair      Family History  Problem Relation Age of Onset  . Hypertension Mother   . Cancer Mother   . Hypertension Father   . Diabetes Father   . Cancer Father     Social History:  reports that she has never smoked. She does not have any smokeless tobacco history on file. She reports that she does not drink alcohol or use illicit drugs.  REVIEW Of SYSTEMS:  History of iron deficiency anemia followed by PCP  She has been doing well with changing her diet and has lost weight.  Also blood pressure appears to be improved now    Examination:   BP 120/74 mmHg  Pulse 93  Temp(Src) 99.2 F (37.3 C) (Oral)  Resp 20  Ht 5\' 5"  (1.651 m)  Wt 327 lb (148.326 kg)  BMI 54.42 kg/m2  SpO2 98%   GENERAL APPEARANCE:  she looks well, Neck exam shows no thyroid enlargement Biceps reflexes appear to be showing normal relaxation     Assessments   Hypothyroidism, Post ablative She has gradually required higher doses of thyroid supplement since 2015 Currently TSH is slightly low and this may be related to her taking the 112 g dose before breakfast instead of taking half tablet twice a day; is tolerating this well  She is doing well subjectively also  OBESITY: Her weight is improving significantly   Treatment:  Since her TSH is slightly low she can reduce the  dose by half a tablet once a week for now She will need to follow-up in 6 months  Patient Instructions  1/2 on sundays, 1 on other days     [facility] 08/05/2015, 4:27 PM

## 2015-08-05 NOTE — Progress Notes (Signed)
Pre visit review using our clinic review tool, if applicable. No additional management support is needed unless otherwise documented below in the visit note. 

## 2015-08-05 NOTE — Patient Instructions (Signed)
1/2 on sundays, 1 on other days

## 2015-08-31 ENCOUNTER — Institutional Professional Consult (permissible substitution): Payer: BLUE CROSS/BLUE SHIELD | Admitting: Pulmonary Disease

## 2015-09-28 ENCOUNTER — Ambulatory Visit (INDEPENDENT_AMBULATORY_CARE_PROVIDER_SITE_OTHER): Payer: BLUE CROSS/BLUE SHIELD | Admitting: Internal Medicine

## 2015-09-28 ENCOUNTER — Other Ambulatory Visit (INDEPENDENT_AMBULATORY_CARE_PROVIDER_SITE_OTHER): Payer: BLUE CROSS/BLUE SHIELD

## 2015-09-28 ENCOUNTER — Encounter: Payer: Self-pay | Admitting: Internal Medicine

## 2015-09-28 VITALS — BP 150/100 | HR 95 | Temp 98.5°F | Resp 16 | Wt 328.0 lb

## 2015-09-28 DIAGNOSIS — E890 Postprocedural hypothyroidism: Secondary | ICD-10-CM

## 2015-09-28 DIAGNOSIS — R002 Palpitations: Secondary | ICD-10-CM

## 2015-09-28 DIAGNOSIS — I10 Essential (primary) hypertension: Secondary | ICD-10-CM | POA: Insufficient documentation

## 2015-09-28 DIAGNOSIS — Z0000 Encounter for general adult medical examination without abnormal findings: Secondary | ICD-10-CM | POA: Diagnosis not present

## 2015-09-28 DIAGNOSIS — R079 Chest pain, unspecified: Secondary | ICD-10-CM

## 2015-09-28 DIAGNOSIS — Z8632 Personal history of gestational diabetes: Secondary | ICD-10-CM

## 2015-09-28 DIAGNOSIS — R0683 Snoring: Secondary | ICD-10-CM

## 2015-09-28 DIAGNOSIS — Z833 Family history of diabetes mellitus: Secondary | ICD-10-CM | POA: Diagnosis not present

## 2015-09-28 DIAGNOSIS — R7303 Prediabetes: Secondary | ICD-10-CM | POA: Insufficient documentation

## 2015-09-28 DIAGNOSIS — R030 Elevated blood-pressure reading, without diagnosis of hypertension: Secondary | ICD-10-CM

## 2015-09-28 DIAGNOSIS — IMO0001 Reserved for inherently not codable concepts without codable children: Secondary | ICD-10-CM

## 2015-09-28 HISTORY — DX: Personal history of gestational diabetes: Z86.32

## 2015-09-28 LAB — COMPREHENSIVE METABOLIC PANEL
ALT: 11 U/L (ref 0–35)
AST: 13 U/L (ref 0–37)
Albumin: 3.8 g/dL (ref 3.5–5.2)
Alkaline Phosphatase: 102 U/L (ref 39–117)
BUN: 14 mg/dL (ref 6–23)
CO2: 29 mEq/L (ref 19–32)
Calcium: 9.2 mg/dL (ref 8.4–10.5)
Chloride: 101 mEq/L (ref 96–112)
Creatinine, Ser: 0.78 mg/dL (ref 0.40–1.20)
GFR: 100.88 mL/min (ref >60.00)
Glucose, Bld: 91 mg/dL (ref 70–99)
Potassium: 3.7 mEq/L (ref 3.5–5.1)
Sodium: 136 mEq/L (ref 135–145)
Total Bilirubin: 0.6 mg/dL (ref 0.2–1.2)
Total Protein: 7.7 g/dL (ref 6.0–8.3)

## 2015-09-28 LAB — CBC WITH DIFFERENTIAL/PLATELET
Basophils Absolute: 0.1 10*3/uL (ref 0.0–0.1)
Basophils Relative: 0.5 % (ref 0.0–3.0)
Eosinophils Absolute: 0.1 10*3/uL (ref 0.0–0.7)
Eosinophils Relative: 0.8 % (ref 0.0–5.0)
HCT: 28.7 % — ABNORMAL LOW (ref 36.0–46.0)
Hemoglobin: 9.1 g/dL — ABNORMAL LOW (ref 12.0–15.0)
Lymphocytes Relative: 39.9 % (ref 12.0–46.0)
Lymphs Abs: 4.1 10*3/uL — ABNORMAL HIGH (ref 0.7–4.0)
MCHC: 31.7 g/dL (ref 30.0–36.0)
MCV: 67.9 fl — ABNORMAL LOW (ref 78.0–100.0)
Monocytes Absolute: 0.6 10*3/uL (ref 0.1–1.0)
Monocytes Relative: 5.8 % (ref 3.0–12.0)
Neutro Abs: 5.4 10*3/uL (ref 1.4–7.7)
Neutrophils Relative %: 53 % (ref 43.0–77.0)
Platelets: 431 10*3/uL — ABNORMAL HIGH (ref 150.0–400.0)
RBC: 4.22 Mil/uL (ref 3.87–5.11)
RDW: 17.3 % — ABNORMAL HIGH (ref 11.5–15.5)
WBC: 10.2 10*3/uL (ref 4.0–10.5)

## 2015-09-28 LAB — LIPID PANEL
Cholesterol: 187 mg/dL (ref 0–200)
HDL: 43.7 mg/dL (ref >39.00)
LDL Cholesterol: 126 mg/dL — ABNORMAL HIGH (ref 0–99)
NonHDL: 143.64
Total CHOL/HDL Ratio: 4
Triglycerides: 89 mg/dL (ref 0.0–149.0)
VLDL: 17.8 mg/dL (ref 0.0–40.0)

## 2015-09-28 LAB — HEMOGLOBIN A1C: Hgb A1c MFr Bld: 6.1 % (ref 4.6–6.5)

## 2015-09-28 NOTE — Assessment & Plan Note (Addendum)
EKG today - normal Check echo Decrease caffeine

## 2015-09-28 NOTE — Assessment & Plan Note (Signed)
Check a1c 

## 2015-09-28 NOTE — Progress Notes (Signed)
Subjective:    Patient ID: Erin [name], female    DOB: [birth year], 50 y.o.   MRN: [id number]  HPI She is here for a physical exam.   She is still having palpitations. She has them when she thinks the thyroid is overactive.  When she is extremely hot of cold.  She will get some lightheadedness.  Can occur once a week. She does drink sweet tea and eats chocolate and has had the palpitations when she has eaten chocolate. She eats 2-3 sweet tea a drink.  She is not exercising regularly.  She has no other concerns.  Medications and allergies reviewed with patient and updated if appropriate.  Patient Active Problem List   Diagnosis Date Noted  . Morbid obesity ([place]) 05/17/2015  . Umbilical hernia [id number]  . Lumbar radiculopathy 05/17/2015  . Snoring 05/17/2015  . Pain in the chest 05/17/2015  . Palpitations 05/17/2015  . ANEMIA 09/03/2007  . Hypothyroidism following radioiodine therapy 09/02/2007  . INTERSTITIAL CYSTITIS 09/01/2007  . HYPERTENSION, BENIGN ESSENTIAL 08/07/2007  . ALLERGIC RHINITIS 08/07/2007  . [place] DISEASE, LUMBAR 08/07/2007    Current Outpatient Prescriptions on File Prior to Visit  Medication Sig Dispense Refill  . amoxicillin-clavulanate (AUGMENTIN) 875-125 MG tablet Take 1 tablet by mouth 2 (two) times daily.  1  . ergocalciferol (VITAMIN D2) 50000 UNITS capsule Take 50,000 Units by mouth 2 (two) times a week. Monday and Wednesday.    [name] ibuprofen (ADVIL,MOTRIN) 200 MG tablet Take 600 mg by mouth every 6 (six) hours as needed for pain.    [name] levothyroxine (SYNTHROID, LEVOTHROID) 112 MCG tablet Take 1 tablet (112 mcg total) by mouth daily. 90 tablet 2  . Soft Lens Products (SALINE) SOLN 2 drops by Does not apply route daily as needed (for eye problem).     No current facility-administered medications on file prior to visit.    Past Medical History  Diagnosis Date  . Thyroid disease   . Fibroids     Past Surgical History  Procedure Laterality Date    . Cesarean section    . Appendectomy    . Cholecystectomy    . Tumor removal  fibroids  . Hernia repair      Social History   Social History  . Marital Status: Married    Spouse Name: N/A  . Number of Children: N/A  . Years of Education: N/A   Social History Main Topics  . Smoking status: Never Smoker   . Smokeless tobacco: Not on file  . Alcohol Use: No  . Drug Use: No  . Sexual Activity: Not on file   Other Topics Concern  . Not on file   Social History Narrative    Family History  Problem Relation Age of Onset  . Hypertension Mother   . Cancer Mother   . Hypertension Father   . Diabetes Father   . Cancer Father     Review of Systems  Constitutional: Negative for fever, chills, appetite change and fatigue.  HENT: Negative for hearing loss.   Eyes: Negative for visual disturbance.  Respiratory: Positive for shortness of breath (going up stairs, walking for alongperiod of time). Negative for cough and wheezing.   Cardiovascular: Positive for chest pain (some discomfort with palpitaitons, occ sharp pain, chornic and unchanged), palpitations and leg swelling.  Gastrointestinal: Negative for nausea, abdominal pain, diarrhea, constipation and blood in stool.       Occasional  Endocrine: Negative for polydipsia and polyuria.  Genitourinary:  Negative for dysuria and hematuria.  Musculoskeletal: Positive for back pain (occ with activity) and arthralgias (knees and hips).  Skin: Negative for color change and rash.  Neurological: Positive for light-headedness (occasional) and headaches (with menses). Negative for dizziness.  Psychiatric/Behavioral: Positive for sleep disturbance (due to child). Negative for dysphoric mood. The patient is not nervous/anxious.        Objective:   Filed Vitals:   09/28/15 1408  BP: 150/100  Pulse: 95  Temp: 98.5 F (36.9 C)  Resp: 16   Filed Weights   09/28/15 1408  Weight: 328 lb (148.78 kg)   Body mass index is 54.58  kg/(m^2).   Physical Exam Constitutional: She appears well-developed and well-nourished. No distress.  HENT:  Head: Normocephalic and atraumatic.  Right Ear: External ear normal. Normal ear canal and TM Left Ear: External ear normal.  Normal ear canal and TM Mouth/Throat: Oropharynx is clear and moist.  Eyes: Conjunctivae and EOM are normal.  Neck: Neck supple. No tracheal deviation present. No thyromegaly present.  No carotid bruit  Cardiovascular: Normal rate, regular rhythm and normal heart sounds.   No murmur heard.  No edema. Pulmonary/Chest: Effort normal and breath sounds normal. No respiratory distress. She has no wheezes. She has no rales.  Breast: deferred to Gyn Abdominal: Soft. She exhibits no distension. There is no tenderness.  Lymphadenopathy: She has no cervical adenopathy.  Skin: Skin is warm and dry. She is not diaphoretic.  Psychiatric: She has a normal mood and affect. Her behavior is normal.         Assessment & Plan:   Physical exam: Screening blood work ordered Immunizations - deferred tetanus today Mammogram.  Up to date  Gyn  Up to date  Eye exams - will schedule EKG - today for palpitations, abnormal, but unchanged - echo ordered previously - will schedule Exercise - none - stressed regular exercise Weight -  Stressed weight loss Skin  - no concerns Substance abuse - no abuse   See Problem List for Assessment and Plan of chronic medical problems.  F/u in 4 weeks to recheck bp and follow up on chest pain

## 2015-09-28 NOTE — Patient Instructions (Addendum)
Reschedule the pulmonary appointment   Work on starting exercise and work on weight loss   Test(s) ordered today. Your results will be released to [place] (or called to you) after review, usually within 72hours after test completion. If any changes need to be made, you will be notified at that same time.  All other Health Maintenance issues reviewed.   All recommended immunizations and age-appropriate screenings are up-to-date or discussed.  No immunizations administered today.   Medications reviewed and updated.  No changes recommended at this time.   Please follow up in 4 weeks   Health Maintenance, Female Adopting a healthy lifestyle and getting preventive care can go a long way to promote health and wellness. Talk with your health care provider about what schedule of regular examinations is right for you. This is a good chance for you to check in with your provider about disease prevention and staying healthy. In between checkups, there are plenty of things you can do on your own. Experts have done a lot of research about which lifestyle changes and preventive measures are most likely to keep you healthy. Ask your health care provider for more information. WEIGHT AND DIET  Eat a healthy diet  Be sure to include plenty of vegetables, fruits, low-fat dairy products, and lean protein.  Do not eat a lot of foods high in solid fats, added sugars, or salt.  Get regular exercise. This is one of the most important things you can do for your health.  Most adults should exercise for at least 150 minutes each week. The exercise should increase your heart rate and make you sweat (moderate-intensity exercise).  Most adults should also do strengthening exercises at least twice a week. This is in addition to the moderate-intensity exercise.  Maintain a healthy weight  Body mass index (BMI) is a measurement that can be used to identify possible weight problems. It estimates body fat based on  height and weight. Your health care provider can help determine your BMI and help you achieve or maintain a healthy weight.  For females 78 years of age and older:   A BMI below 18.5 is considered underweight.  A BMI of 18.5 to 24.9 is normal.  A BMI of 25 to 29.9 is considered overweight.  A BMI of 30 and above is considered obese.  Watch levels of cholesterol and blood lipids  You should start having your blood tested for lipids and cholesterol at 49 years of age, then have this test every 5 years.  You may need to have your cholesterol levels checked more often if:  Your lipid or cholesterol levels are high.  You are older than 49 years of age.  You are at high risk for heart disease.  CANCER SCREENING   Lung Cancer  Lung cancer screening is recommended for adults 2-11 years old who are at high risk for lung cancer because of a history of smoking.  A yearly low-dose CT scan of the lungs is recommended for people who:  Currently smoke.  Have quit within the past 15 years.  Have at least a 30-pack-year history of smoking. A pack year is smoking an average of one pack of cigarettes a day for 1 year.  Yearly screening should continue until it has been 15 years since you quit.  Yearly screening should stop if you develop a health problem that would prevent you from having lung cancer treatment.  Breast Cancer  Practice breast self-awareness. This means understanding how your  breasts normally appear and feel.  It also means doing regular breast self-exams. Let your health care provider know about any changes, no matter how small.  If you are in your 20s or 30s, you should have a clinical breast exam (CBE) by a health care provider every 1-3 years as part of a regular health exam.  If you are 37 or older, have a CBE every year. Also consider having a breast X-ray (mammogram) every year.  If you have a family history of breast cancer, talk to your health care  provider about genetic screening.  If you are at high risk for breast cancer, talk to your health care provider about having an MRI and a mammogram every year.  Breast cancer gene (BRCA) assessment is recommended for women who have family members with BRCA-related cancers. BRCA-related cancers include:  Breast.  Ovarian.  Tubal.  Peritoneal cancers.  Results of the assessment will determine the need for genetic counseling and BRCA1 and BRCA2 testing. Cervical Cancer Your health care provider may recommend that you be screened regularly for cancer of the pelvic organs (ovaries, uterus, and vagina). This screening involves a pelvic examination, including checking for microscopic changes to the surface of your cervix (Pap test). You may be encouraged to have this screening done every 3 years, beginning at age 42.  For women ages 59-65, health care providers may recommend pelvic exams and Pap testing every 3 years, or they may recommend the Pap and pelvic exam, combined with testing for human papilloma virus (HPV), every 5 years. Some types of HPV increase your risk of cervical cancer. Testing for HPV may also be done on women of any age with unclear Pap test results.  Other health care providers may not recommend any screening for nonpregnant women who are considered low risk for pelvic cancer and who do not have symptoms. Ask your health care provider if a screening pelvic exam is right for you.  If you have had past treatment for cervical cancer or a condition that could lead to cancer, you need Pap tests and screening for cancer for at least 20 years after your treatment. If Pap tests have been discontinued, your risk factors (such as having a new sexual partner) need to be reassessed to determine if screening should resume. Some women have medical problems that increase the chance of getting cervical cancer. In these cases, your health care provider may recommend more frequent screening and  Pap tests. Colorectal Cancer  This type of cancer can be detected and often prevented.  Routine colorectal cancer screening usually begins at 49 years of age and continues through 49 years of age.  Your health care provider may recommend screening at an earlier age if you have risk factors for colon cancer.  Your health care provider may also recommend using home test kits to check for hidden blood in the stool.  A small camera at the end of a tube can be used to examine your colon directly (sigmoidoscopy or colonoscopy). This is done to check for the earliest forms of colorectal cancer.  Routine screening usually begins at age 43.  Direct examination of the colon should be repeated every 5-10 years through 49 years of age. However, you may need to be screened more often if early forms of precancerous polyps or small growths are found. Skin Cancer  Check your skin from head to toe regularly.  Tell your health care provider about any new moles or changes in moles, especially if  there is a change in a mole's shape or color.  Also tell your health care provider if you have a mole that is larger than the size of a pencil eraser.  Always use sunscreen. Apply sunscreen liberally and repeatedly throughout the day.  Protect yourself by wearing long sleeves, pants, a wide-brimmed hat, and sunglasses whenever you are outside. HEART DISEASE, DIABETES, AND HIGH BLOOD PRESSURE   High blood pressure causes heart disease and increases the risk of stroke. High blood pressure is more likely to develop in:  People who have blood pressure in the high end of the normal range (130-139/85-89 mm Hg).  People who are overweight or obese.  People who are African American.  If you are 70-55 years of age, have your blood pressure checked every 3-5 years. If you are 46 years of age or older, have your blood pressure checked every year. You should have your blood pressure measured twice--once when you are at  a hospital or clinic, and once when you are not at a hospital or clinic. Record the average of the two measurements. To check your blood pressure when you are not at a hospital or clinic, you can use:  An automated blood pressure machine at a pharmacy.  A home blood pressure monitor.  If you are between 50 years and 72 years old, ask your health care provider if you should take aspirin to prevent strokes.  Have regular diabetes screenings. This involves taking a blood sample to check your fasting blood sugar level.  If you are at a normal weight and have a low risk for diabetes, have this test once every three years after 49 years of age.  If you are overweight and have a high risk for diabetes, consider being tested at a younger age or more often. PREVENTING INFECTION  Hepatitis B  If you have a higher risk for hepatitis B, you should be screened for this virus. You are considered at high risk for hepatitis B if:  You were born in a country where hepatitis B is common. Ask your health care provider which countries are considered high risk.  Your parents were born in a high-risk country, and you have not been immunized against hepatitis B (hepatitis B vaccine).  You have HIV or AIDS.  You use needles to inject street drugs.  You live with someone who has hepatitis B.  You have had sex with someone who has hepatitis B.  You get hemodialysis treatment.  You take certain medicines for conditions, including cancer, organ transplantation, and autoimmune conditions. Hepatitis C  Blood testing is recommended for:  Everyone born from [birth year] through 1964.  Anyone with known risk factors for hepatitis C. Sexually transmitted infections (STIs)  You should be screened for sexually transmitted infections (STIs) including gonorrhea and chlamydia if:  You are sexually active and are younger than 49 years of age.  You are older than 49 years of age and your health care provider tells you  that you are at risk for this type of infection.  Your sexual activity has changed since you were last screened and you are at an increased risk for chlamydia or gonorrhea. Ask your health care provider if you are at risk.  If you do not have HIV, but are at risk, it may be recommended that you take a prescription medicine daily to prevent HIV infection. This is called pre-exposure prophylaxis (PrEP). You are considered at risk if:  You are sexually active and do  not regularly use condoms or know the HIV status of your partner(s).  You take drugs by injection.  You are sexually active with a partner who has HIV. Talk with your health care provider about whether you are at high risk of being infected with HIV. If you choose to begin PrEP, you should first be tested for HIV. You should then be tested every 3 months for as long as you are taking PrEP.  PREGNANCY   If you are premenopausal and you may become pregnant, ask your health care provider about preconception counseling.  If you may become pregnant, take 400 to 800 micrograms (mcg) of folic acid every day.  If you want to prevent pregnancy, talk to your health care provider about birth control (contraception). OSTEOPOROSIS AND MENOPAUSE   Osteoporosis is a disease in which the bones lose minerals and strength with aging. This can result in serious bone fractures. Your risk for osteoporosis can be identified using a bone density scan.  If you are 52 years of age or older, or if you are at risk for osteoporosis and fractures, ask your health care provider if you should be screened.  Ask your health care provider whether you should take a calcium or vitamin D supplement to lower your risk for osteoporosis.  Menopause may have certain physical symptoms and risks.  Hormone replacement therapy may reduce some of these symptoms and risks. Talk to your health care provider about whether hormone replacement therapy is right for you.  HOME  CARE INSTRUCTIONS   Schedule regular health, dental, and eye exams.  Stay current with your immunizations.   Do not use any tobacco products including cigarettes, chewing tobacco, or electronic cigarettes.  If you are pregnant, do not drink alcohol.  If you are breastfeeding, limit how much and how often you drink alcohol.  Limit alcohol intake to no more than 1 drink per day for nonpregnant women. One drink equals 12 ounces of beer, 5 ounces of wine, or 1 ounces of hard liquor.  Do not use street drugs.  Do not share needles.  Ask your health care provider for help if you need support or information about quitting drugs.  Tell your health care provider if you often feel depressed.  Tell your health care provider if you have ever been abused or do not feel safe at home.   This information is not intended to replace advice given to you by your health care provider. Make sure you discuss any questions you have with your health care provider.   Document Released: 12/25/2010 Document Revised: 07/02/2014 Document Reviewed: 05/13/2013 Elsevier Interactive Patient Education [employer].

## 2015-09-28 NOTE — Assessment & Plan Note (Signed)
Did not schedule pulmonary appt - advised to schedule it

## 2015-09-28 NOTE — Assessment & Plan Note (Signed)
Stressed exercise and weight loss

## 2015-09-28 NOTE — Assessment & Plan Note (Addendum)
Chest pain is chronic and unchanged, does not sound cardiac Echo ordered at last visit and was not done - will get it scheduled EKG today - no change F/u echo and consider stress test

## 2015-09-28 NOTE — Progress Notes (Signed)
Pre visit review using our clinic review tool, if applicable. No additional management support is needed unless otherwise documented below in the visit note. 

## 2015-09-28 NOTE — Assessment & Plan Note (Signed)
No official htn Not on any meds Recheck bp in one month

## 2015-10-06 ENCOUNTER — Other Ambulatory Visit: Payer: Self-pay | Admitting: Emergency Medicine

## 2015-10-06 ENCOUNTER — Encounter: Payer: Self-pay | Admitting: Emergency Medicine

## 2015-10-25 ENCOUNTER — Other Ambulatory Visit (INDEPENDENT_AMBULATORY_CARE_PROVIDER_SITE_OTHER): Payer: BLUE CROSS/BLUE SHIELD

## 2015-10-25 ENCOUNTER — Telehealth: Payer: Self-pay | Admitting: Emergency Medicine

## 2015-10-25 DIAGNOSIS — D6489 Other specified anemias: Secondary | ICD-10-CM

## 2015-10-25 LAB — HEMOCCULT SLIDES (X 3 CARDS)
Fecal Occult Blood: NEGATIVE
OCCULT 1: NEGATIVE
OCCULT 2: NEGATIVE
OCCULT 3: NEGATIVE
OCCULT 4: NEGATIVE
OCCULT 5: NEGATIVE

## 2015-10-25 NOTE — Telephone Encounter (Signed)
Hemocult cards sent to lab, orders entered.

## 2015-10-26 ENCOUNTER — Ambulatory Visit: Payer: BLUE CROSS/BLUE SHIELD | Admitting: Internal Medicine

## 2015-10-28 ENCOUNTER — Telehealth: Payer: Self-pay | Admitting: Internal Medicine

## 2015-10-28 NOTE — Telephone Encounter (Signed)
LVM for pt to call back. Will try again Monday

## 2015-10-28 NOTE — Telephone Encounter (Signed)
Pt called in to get results.    Best number [phone]

## 2015-10-28 NOTE — Telephone Encounter (Signed)
Please call this number # [phone].

## 2015-11-01 ENCOUNTER — Other Ambulatory Visit: Payer: Self-pay | Admitting: Internal Medicine

## 2015-11-01 DIAGNOSIS — D649 Anemia, unspecified: Secondary | ICD-10-CM

## 2015-11-01 MED ORDER — INTEGRA 62.5-62.5-40-3 MG PO CAPS: 1.0000 | ORAL_CAPSULE | Freq: Every day | ORAL | Status: DC

## 2015-11-01 NOTE — Telephone Encounter (Signed)
Spoke with pt to inform about Stool Card results.

## 2015-11-07 ENCOUNTER — Telehealth: Payer: Self-pay | Admitting: Internal Medicine

## 2015-11-07 NOTE — Telephone Encounter (Signed)
Pt called to check up on the referral for echo and pulmonary doctor. Please order in system for both referral

## 2015-11-07 NOTE — Telephone Encounter (Signed)
Both already ordered

## 2015-11-09 ENCOUNTER — Ambulatory Visit (INDEPENDENT_AMBULATORY_CARE_PROVIDER_SITE_OTHER): Payer: BLUE CROSS/BLUE SHIELD | Admitting: Internal Medicine

## 2015-11-09 ENCOUNTER — Encounter: Payer: Self-pay | Admitting: Internal Medicine

## 2015-11-09 VITALS — BP 124/82 | HR 93 | Temp 98.7°F | Resp 16 | Wt 329.0 lb

## 2015-11-09 DIAGNOSIS — E890 Postprocedural hypothyroidism: Secondary | ICD-10-CM

## 2015-11-09 DIAGNOSIS — IMO0001 Reserved for inherently not codable concepts without codable children: Secondary | ICD-10-CM

## 2015-11-09 DIAGNOSIS — R030 Elevated blood-pressure reading, without diagnosis of hypertension: Secondary | ICD-10-CM

## 2015-11-09 DIAGNOSIS — D649 Anemia, unspecified: Secondary | ICD-10-CM | POA: Diagnosis not present

## 2015-11-09 DIAGNOSIS — R7303 Prediabetes: Secondary | ICD-10-CM | POA: Diagnosis not present

## 2015-11-09 NOTE — Assessment & Plan Note (Signed)
New diagnosis Stressed decreasing sugar intake - sweet tea Start regular exercise Work on weight loss Will recheck next spring

## 2015-11-09 NOTE — Progress Notes (Signed)
Pre visit review using our clinic review tool, if applicable. No additional management support is needed unless otherwise documented below in the visit note. 

## 2015-11-09 NOTE — Patient Instructions (Signed)
Have blood work done in 3-4 weeks - you do not need to fast.  Test(s) ordered today. Your results will be released to [place] (or called to you) after review, usually within 72hours after test completion. If any changes need to be made, you will be notified at that same time.  .   Medications reviewed and updated.  No changes recommended at this time.    Please followup in Spring for a physical exam

## 2015-11-09 NOTE — Assessment & Plan Note (Signed)
Just started iron daily Recheck cbc, iron, b12 in 3-4 weeks Will follow up with gyn

## 2015-11-09 NOTE — Telephone Encounter (Signed)
Spoke with pt, stated that pulmonary has been in contact with her and she would call them to reschedule as well as the echo.

## 2015-11-09 NOTE — Assessment & Plan Note (Signed)
Check tsh  Titrate med dose if needed  

## 2015-11-09 NOTE — Assessment & Plan Note (Signed)
BP normal today Will monitor Work on lifestyle changes

## 2015-11-09 NOTE — Progress Notes (Signed)
Subjective:    Patient ID: Erin [name], female    DOB: 10/03/1965, 51 y.o.   MRN: [id number]  HPI She is here for follow up.  Heavy menses, anemia:  She is taking iron pills daily, but just started them one week ago.  Her menses are irregular, but heavy.  She has an appointment with her gyn next month.  She is unsure if she is near menopause.  She denies abdominal cramping.      Hypothyroidism:  She is taking her medication daily.  She denies any recent changes in energy or weight that are unexplained.   Prediabetes:  She is not compliant with a low sugar/carbohydrate diet.  She drinks a lot of sweet tea.  She is not exercising regularly.   Medications and allergies reviewed with patient and updated if appropriate.  Patient Active Problem List   Diagnosis Date Noted  . H/O gestational diabetes mellitus, not currently pregnant 09/28/2015  . Elevated BP 09/28/2015  . Prediabetes 09/28/2015  . Morbid obesity ([place]) 05/17/2015  . Umbilical hernia 123XX123  . Lumbar radiculopathy 05/17/2015  . Snoring 05/17/2015  . Pain in the chest 05/17/2015  . Palpitations 05/17/2015  . ANEMIA 09/03/2007  . Hypothyroidism following radioiodine therapy 09/02/2007  . INTERSTITIAL CYSTITIS 09/01/2007  . ALLERGIC RHINITIS 08/07/2007  . [place] DISEASE, LUMBAR 08/07/2007    Current Outpatient Prescriptions on File Prior to Visit  Medication Sig Dispense Refill  . Fe Fum-FePoly-Vit C-Vit B3 (INTEGRA) 62.5-62.5-40-3 MG CAPS Take 1 capsule by mouth daily. 30 capsule 5  . ibuprofen (ADVIL,MOTRIN) 200 MG tablet Take 600 mg by mouth every 6 (six) hours as needed for pain.    [name] levothyroxine (SYNTHROID, LEVOTHROID) 112 MCG tablet Take 1 tablet (112 mcg total) by mouth daily. 90 tablet 2  . Soft Lens Products (SALINE) SOLN 2 drops by Does not apply route daily as needed (for eye problem).     No current facility-administered medications on file prior to visit.    Past Medical History  Diagnosis  Date  . Thyroid disease   . Fibroids     Past Surgical History  Procedure Laterality Date  . Cesarean section    . Appendectomy    . Cholecystectomy    . Tumor removal  fibroids  . Hernia repair      Social History   Social History  . Marital Status: Married    Spouse Name: N/A  . Number of Children: N/A  . Years of Education: N/A   Social History Main Topics  . Smoking status: Never Smoker   . Smokeless tobacco: None  . Alcohol Use: No  . Drug Use: No  . Sexual Activity: Not Asked   Other Topics Concern  . None   Social History Narrative    Family History  Problem Relation Age of Onset  . Hypertension Mother   . Cancer Mother   . Hypertension Father   . Diabetes Father   . Cancer Father     Review of Systems  Constitutional: Negative for fever.  Respiratory: Negative for cough, shortness of breath and wheezing.   Cardiovascular: Negative for chest pain, palpitations and leg swelling.  Gastrointestinal: Negative for abdominal pain and blood in stool.  Neurological: Positive for headaches (related to menses). Negative for light-headedness.       Objective:   Filed Vitals:   11/09/15 1557  BP: 124/82  Pulse: 93  Temp: 98.7 F (37.1 C)  Resp: 16   Filed  Weights   11/09/15 1557  Weight: 329 lb (149.233 kg)   Body mass index is 54.75 kg/(m^2).   Physical Exam Constitutional: Appears well-developed and well-nourished. No distress.  Neck: Neck supple. No tracheal deviation present. No thyromegaly present.  No carotid bruit. No cervical adenopathy.   Cardiovascular: Normal rate, regular rhythm and normal heart sounds.   No murmur heard.  trace edema Pulmonary/Chest: Effort normal and breath sounds normal. No respiratory distress. No wheezes.        Assessment & Plan:   See Problem List for Assessment and Plan of chronic medical problems.

## 2015-12-29 ENCOUNTER — Other Ambulatory Visit: Payer: Self-pay | Admitting: Obstetrics and Gynecology

## 2015-12-29 LAB — HM MAMMOGRAPHY: HM Mammogram: NORMAL (ref 0–4)

## 2016-01-06 ENCOUNTER — Encounter: Payer: Self-pay | Admitting: Internal Medicine

## 2016-01-30 ENCOUNTER — Other Ambulatory Visit: Payer: BLUE CROSS/BLUE SHIELD

## 2016-02-02 ENCOUNTER — Ambulatory Visit: Payer: BLUE CROSS/BLUE SHIELD | Admitting: Endocrinology

## 2016-02-14 ENCOUNTER — Other Ambulatory Visit (INDEPENDENT_AMBULATORY_CARE_PROVIDER_SITE_OTHER): Payer: BLUE CROSS/BLUE SHIELD

## 2016-02-14 DIAGNOSIS — R7303 Prediabetes: Secondary | ICD-10-CM

## 2016-02-14 DIAGNOSIS — D649 Anemia, unspecified: Secondary | ICD-10-CM

## 2016-02-14 DIAGNOSIS — E890 Postprocedural hypothyroidism: Secondary | ICD-10-CM

## 2016-02-14 LAB — TSH: TSH: 10.45 u[IU]/mL — ABNORMAL HIGH (ref 0.35–4.50)

## 2016-02-14 LAB — T4, FREE: Free T4: 0.72 ng/dL (ref 0.60–1.60)

## 2016-02-16 ENCOUNTER — Other Ambulatory Visit: Payer: BLUE CROSS/BLUE SHIELD

## 2016-02-22 ENCOUNTER — Ambulatory Visit: Payer: BLUE CROSS/BLUE SHIELD | Admitting: Endocrinology

## 2016-03-14 ENCOUNTER — Ambulatory Visit (INDEPENDENT_AMBULATORY_CARE_PROVIDER_SITE_OTHER): Payer: BLUE CROSS/BLUE SHIELD | Admitting: Endocrinology

## 2016-03-14 ENCOUNTER — Encounter: Payer: Self-pay | Admitting: Endocrinology

## 2016-03-14 VITALS — BP 115/82 | HR 90 | Ht 65.0 in | Wt 330.0 lb

## 2016-03-14 DIAGNOSIS — E890 Postprocedural hypothyroidism: Secondary | ICD-10-CM | POA: Diagnosis not present

## 2016-03-14 MED ORDER — LEVOTHYROXINE SODIUM 137 MCG PO TABS: 137.0000 ug | ORAL_TABLET | Freq: Every day | ORAL | 2 refills | Status: DC

## 2016-03-14 NOTE — Progress Notes (Signed)
[name] 50 y.o.   Reason for Appointment:  Hypothyroidism, followup visit    History of Present Illness:   The hypothyroidism was first diagnosed  after treatment of her Graves' disease, had I-131 treatment in 08/2008  Her dose had been adjusted previously and she had been taking a lower dose of 50 g for some time with stable levels However in 6/15 her TSH was significantly high at 8.47 despite being compliant with her medication. With increasing her dose to 75 g she did not seem to have any significant change in her energy level  Although her TSH was normal in 01/2014 it was significantly higher in 2/16 With  increasing her dose to Subsequently with 112 g TSH improved but in 1/17 it was mildly decreased  She still complains of fatigue most of the time and does not think this is any worse recently Her weight tends to fluctuate but overall more stable now  She takes her medication and about 3-4 AM without any food and then is eating breakfast around 7 AM, also takes iron pill at breakfast   Compliance with the medical regimen has been good every morning    Lab Results  Component Value Date   TSH 10.45 (H) 02/14/2016   TSH 0.22 (L) 07/25/2015   TSH 4.48 11/17/2014   FREET4 0.72 02/14/2016   FREET4 1.07 07/25/2015   FREET4 0.81 11/17/2014    Wt Readings from Last 3 Encounters:  03/14/16 (!) 330 lb (149.7 kg)  11/09/15 (!) 329 lb (149.2 kg)  09/28/15 (!) 328 lb (148.8 kg)       Medication List       Accurate as of 03/14/16  3:48 PM. Always use your most recent med list.          ibuprofen 200 MG tablet Commonly known as:  ADVIL,MOTRIN Take 600 mg by mouth every 6 (six) hours as needed for pain.   INTEGRA 62.5-62.5-40-3 MG Caps Take 1 capsule by mouth daily.   levothyroxine 137 MCG tablet Commonly known as:  SYNTHROID, LEVOTHROID Take 1 tablet (137 mcg total) by mouth daily.   Saline Soln 2 drops by Does not apply route daily as needed  (for eye problem).       Allergies:  Allergies  Allergen Reactions  . Hydrocodone Hives, Itching and Rash    On thighs     Past Medical History:  Diagnosis Date  . Fibroids   . Thyroid disease     Past Surgical History:  Procedure Laterality Date  . APPENDECTOMY    . CESAREAN SECTION    . CHOLECYSTECTOMY    . HERNIA REPAIR    . TUMOR REMOVAL  fibroids    Family History  Problem Relation Age of Onset  . Hypertension Mother   . Cancer Mother   . Hypertension Father   . Diabetes Father   . Cancer Father     Social History:  reports that she has never smoked. She does not have any smokeless tobacco history on file. She reports that she does not drink alcohol or use drugs.  REVIEW Of SYSTEMS:   History of iron deficiency anemia followed by PCP     Examination:   BP 115/82   Pulse 90   Ht 5\' 5"  (1.651 m)   Wt (!) 330 lb (149.7 kg)   BMI 54.91 kg/m    GENERAL APPEARANCE:  she looks well, Neck exam shows no thyroid enlargement Biceps reflexes Difficult to elicit but  may show slightly delayed relaxation on the left side No edema     Assessments   Hypothyroidism, Post ablative She has gradually required higher doses of thyroid supplement since 2015 Currently TSH is higher again even though in January it was low normal with 112 g dose  She very compliant with her thyroid supplement and not taking her iron at the same time Also is using the same pharmacy    Treatment:  Since her TSH is around 10 she can increase the dose to 25 g New prescription for 137 g sent She will need a follow-up in 2 months   There are no Patient Instructions on file for this visit.   Margit Batte 03/14/2016, 3:48 PM

## 2016-04-20 ENCOUNTER — Other Ambulatory Visit: Payer: Self-pay

## 2016-04-20 MED ORDER — LEVOTHYROXINE SODIUM 137 MCG PO TABS: 137.0000 ug | ORAL_TABLET | Freq: Every day | ORAL | 1 refills | Status: DC

## 2016-04-24 ENCOUNTER — Other Ambulatory Visit: Payer: Self-pay

## 2016-04-24 MED ORDER — LEVOTHYROXINE SODIUM 137 MCG PO TABS: 137.0000 ug | ORAL_TABLET | Freq: Every day | ORAL | 1 refills | Status: DC

## 2016-05-31 ENCOUNTER — Encounter: Payer: Self-pay | Admitting: Internal Medicine

## 2016-06-19 ENCOUNTER — Other Ambulatory Visit: Payer: Self-pay | Admitting: Endocrinology

## 2016-08-17 ENCOUNTER — Emergency Department (HOSPITAL_COMMUNITY): Payer: BLUE CROSS/BLUE SHIELD

## 2016-08-17 ENCOUNTER — Emergency Department (HOSPITAL_COMMUNITY)
Admission: EM | Admit: 2016-08-17 | Discharge: 2016-08-17 | Disposition: A | Discharge status: Home / Self Care | Payer: BLUE CROSS/BLUE SHIELD | Attending: Emergency Medicine | Admitting: Emergency Medicine

## 2016-08-17 ENCOUNTER — Encounter (HOSPITAL_COMMUNITY): Payer: Self-pay | Admitting: Emergency Medicine

## 2016-08-17 ENCOUNTER — Telehealth: Payer: Self-pay | Admitting: Internal Medicine

## 2016-08-17 DIAGNOSIS — D649 Anemia, unspecified: Secondary | ICD-10-CM

## 2016-08-17 DIAGNOSIS — Z79899 Other long term (current) drug therapy: Secondary | ICD-10-CM | POA: Insufficient documentation

## 2016-08-17 DIAGNOSIS — R0789 Other chest pain: Secondary | ICD-10-CM | POA: Diagnosis not present

## 2016-08-17 DIAGNOSIS — E039 Hypothyroidism, unspecified: Secondary | ICD-10-CM | POA: Insufficient documentation

## 2016-08-17 DIAGNOSIS — R079 Chest pain, unspecified: Secondary | ICD-10-CM | POA: Diagnosis not present

## 2016-08-17 LAB — I-STAT BETA HCG BLOOD, ED (MC, WL, AP ONLY): I-stat hCG, quantitative: <5 m[IU]/mL (ref <5)

## 2016-08-17 LAB — BASIC METABOLIC PANEL
Anion gap: 7 (ref 5–15)
BUN: 15 mg/dL (ref 6–20)
CO2: 28 mmol/L (ref 22–32)
Calcium: 9.4 mg/dL (ref 8.9–10.3)
Chloride: 105 mmol/L (ref 101–111)
Creatinine, Ser: 0.86 mg/dL (ref 0.44–1.00)
GFR calc Af Amer: >60 mL/min (ref >60)
GFR calc non Af Amer: >60 mL/min (ref >60)
Glucose, Bld: 105 mg/dL — ABNORMAL HIGH (ref 65–99)
Potassium: 4.1 mmol/L (ref 3.5–5.1)
Sodium: 140 mmol/L (ref 135–145)

## 2016-08-17 LAB — CBC
HCT: 33.6 % — ABNORMAL LOW (ref 36.0–46.0)
Hemoglobin: 10.6 g/dL — ABNORMAL LOW (ref 12.0–15.0)
MCH: 23.8 pg — ABNORMAL LOW (ref 26.0–34.0)
MCHC: 31.5 g/dL (ref 30.0–36.0)
MCV: 75.5 fL — ABNORMAL LOW (ref 78.0–100.0)
Platelets: 384 10*3/uL (ref 150–400)
RBC: 4.45 MIL/uL (ref 3.87–5.11)
RDW: 15.8 % — ABNORMAL HIGH (ref 11.5–15.5)
WBC: 10.9 10*3/uL — ABNORMAL HIGH (ref 4.0–10.5)

## 2016-08-17 LAB — I-STAT TROPONIN, ED: Troponin i, poc: 0 ng/mL (ref 0.00–0.08)

## 2016-08-17 MED ORDER — INTEGRA 62.5-62.5-40-3 MG PO CAPS: 1.0000 | ORAL_CAPSULE | Freq: Every day | ORAL | 5 refills | Status: DC

## 2016-08-17 NOTE — Telephone Encounter (Signed)
Patient Name: Erin [name] DOB: 1966-03-19 Initial Comment Caller says, wants an appt, heart palpitations has been going off / on for weeks, not every day. Is having them right now. Nurse Assessment Nurse: Zorita Pang, RN, Deborah Date/Time (Eastern Time): 08/17/2016 9:02:07 AM Confirm and document reason for call. If symptomatic, describe symptoms. ---The caller states that she has been having palpitations for some time but today they are worse. She denies SOB and she feels like something is squeezing her. She states that she had one episode of chest pain. She sees Dr. Quay Burow. Does the patient have any new or worsening symptoms? ---Yes Will a triage be completed? ---Yes Related visit to physician within the last 2 weeks? ---No Does the PT have any chronic conditions? (i.e. diabetes, asthma, etc.) ---Yes List chronic conditions. ---hypothyroid Is the patient pregnant or possibly pregnant? (Ask all females between the ages of 53-55) ---No Is this a behavioral health or substance abuse call? ---No Guidelines Guideline Title Affirmed Question Affirmed Notes Heart Rate and Heartbeat Questions Dizziness, lightheadedness, or weakness Final Disposition User Go to ED Now Zorita Pang, RN, Neoma Laming Comments The caller was advised to go to ED if an appointment is not available within the 4 hours at the office. Will see what availability there is and advise patient. No appointments available. The caller is having a coworker take her to the ED Referrals [facility] - ED Disagree/Comply: Comply

## 2016-08-17 NOTE — ED Provider Notes (Signed)
[facility] DEPT Provider Note   CSN: [id number] Arrival date & time: 08/17/16  1005     History   Chief Complaint Chief Complaint  Patient presents with  . Palpitations    HPI Erin [name] is a 51 y.o. female.  The history is provided by the patient.  Chest Pain   This is a recurrent (has been intermittent for 2-3 years) problem. Episode onset: usually has chest pressure and squeezing every other day, today a little worse. The problem occurs every several days. The problem has not changed since onset.Associated with: spontaneous does not seem to be associated with anything.  she has had a lot of stress recently maybe a little worse. The pain is present in the substernal region. The pain is at a severity of 4/10. The pain is moderate. The quality of the pain is described as pressure-like (squeezing). The pain does not radiate. Associated symptoms include dizziness and shortness of breath. Pertinent negatives include no abdominal pain, no back pain, no cough, no fever, no leg pain, no lower extremity edema, no nausea and no palpitations (no palpitations currently but will feel heart racing with it intermittently). Associated symptoms comments: Will intermittently get lightheaded with this over the last few years but did happen today. She has tried rest for the symptoms. The treatment provided no relief. Risk factors include obesity and stress. Past medical history comments: has hythroid disease but checked 40mo ago and still on meds  Procedure history is negative for echocardiogram and stress echo.    Past Medical History:  Diagnosis Date  . Fibroids   . Thyroid disease     Patient Active Problem List   Diagnosis Date Noted  . H/O gestational diabetes mellitus, not currently pregnant 09/28/2015  . Elevated BP 09/28/2015  . Prediabetes 09/28/2015  . Morbid obesity ([place]) 05/17/2015  . Umbilical hernia 123XX123  . Lumbar radiculopathy 05/17/2015  . Snoring 05/17/2015  . Pain  in the chest 05/17/2015  . Palpitations 05/17/2015  . ANEMIA 09/03/2007  . Hypothyroidism following radioiodine therapy 09/02/2007  . INTERSTITIAL CYSTITIS 09/01/2007  . ALLERGIC RHINITIS 08/07/2007  . [place] DISEASE, LUMBAR 08/07/2007    Past Surgical History:  Procedure Laterality Date  . APPENDECTOMY    . CESAREAN SECTION    . CHOLECYSTECTOMY    . HERNIA REPAIR    . TUMOR REMOVAL  fibroids    OB History    No data available       Home Medications    Prior to Admission medications   Medication Sig Start Date End Date Taking? Authorizing Provider  Fe Fum-FePoly-Vit C-Vit B3 (INTEGRA) 62.5-62.5-40-3 MG CAPS Take 1 capsule by mouth daily. 11/01/15   Binnie Rail, MD  ibuprofen (ADVIL,MOTRIN) 200 MG tablet Take 600 mg by mouth every 6 (six) hours as needed for pain.    Historical Provider, MD  levothyroxine (SYNTHROID, LEVOTHROID) 137 MCG tablet Take 1 tablet (137 mcg total) by mouth daily. 04/24/16   Elayne Snare, MD  levothyroxine (SYNTHROID, LEVOTHROID) 137 MCG tablet TAKE 1 TABLET (137 MCG TOTAL) BY MOUTH DAILY. 06/19/16   Elayne Snare, MD  Soft Lens Products (SALINE) SOLN 2 drops by Does not apply route daily as needed (for eye problem).    Historical Provider, MD    Family History Family History  Problem Relation Age of Onset  . Hypertension Mother   . Cancer Mother   . Hypertension Father   . Diabetes Father   . Cancer Father  Social History Social History  Substance Use Topics  . Smoking status: Never Smoker  . Smokeless tobacco: Not on file  . Alcohol use No     Allergies   Hydrocodone   Review of Systems Review of Systems  Constitutional: Negative for fever.  Respiratory: Positive for shortness of breath. Negative for cough.   Cardiovascular: Positive for chest pain. Negative for palpitations (no palpitations currently but will feel heart racing with it intermittently).  Gastrointestinal: Negative for abdominal pain and nausea.  Musculoskeletal:  Negative for back pain.  Neurological: Positive for dizziness.  All other systems reviewed and are negative.    Physical Exam Updated Vital Signs BP (!) 164/108 (BP Location: Left Arm)   Pulse 96   Temp 98.3 F (36.8 C) (Oral)   Resp 18   Ht 5\' 5"  (1.651 m)   Wt (!) 330 lb (149.7 kg)   SpO2 96%   BMI 54.91 kg/m   Physical Exam  Constitutional: She is oriented to person, place, and time. She appears well-developed and well-nourished. No distress.  HENT:  Head: Normocephalic and atraumatic.  Mouth/Throat: Oropharynx is clear and moist.  Eyes: EOM are normal. Pupils are equal, round, and reactive to light.  Pale conjunctiva  Neck: Normal range of motion. Neck supple.  Cardiovascular: Normal rate, regular rhythm and intact distal pulses.   No murmur heard. Pulmonary/Chest: Effort normal and breath sounds normal. No respiratory distress. She has no wheezes. She has no rales. She exhibits no tenderness.  Abdominal: Soft. She exhibits no distension. There is no tenderness. There is no rebound and no guarding.  Musculoskeletal: Normal range of motion. She exhibits no edema or tenderness.  Neurological: She is alert and oriented to person, place, and time.  Skin: Skin is warm and dry. Capillary refill takes less than 2 seconds. No rash noted. No erythema. There is pallor.  Psychiatric: She has a normal mood and affect. Her behavior is normal.  Nursing note and vitals reviewed.    ED Treatments / Results  Labs (all labs ordered are listed, but only abnormal results are displayed) Labs Reviewed  BASIC METABOLIC PANEL - Abnormal; Notable for the following:       Result Value   Glucose, Bld 105 (*)    All other components within normal limits  CBC - Abnormal; Notable for the following:    WBC 10.9 (*)    Hemoglobin 10.6 (*)    HCT 33.6 (*)    MCV 75.5 (*)    MCH 23.8 (*)    RDW 15.8 (*)    All other components within normal limits  I-STAT TROPOININ, ED  I-STAT BETA HCG  BLOOD, ED ([facility], [facility], [facility] ONLY)    EKG  EKG Interpretation  Date/Time:  Friday August 17 2016 10:17:50 EST Ventricular Rate:  84 PR Interval:    QRS Duration: 85 QT Interval:  371 QTC Calculation: 439 R Axis:   -28 Text Interpretation:  Sinus rhythm Ventricular premature complex Borderline left axis deviation Low voltage, precordial leads Consider anterior infarct No significant change since last tracing Confirmed by Maryan Rued  MD, Loree Fee ([id number]) on 08/17/2016 10:28:37 AM       Radiology Dg Chest 2 View  Result Date: 08/17/2016 CLINICAL DATA:  Heart palpitations.  LEFT-sided chest pain EXAM: CHEST  2 VIEW COMPARISON:  None. FINDINGS: Normal mediastinum and cardiac silhouette. Normal pulmonary vasculature. No evidence of effusion, infiltrate, or pneumothorax. No acute bony abnormality. IMPRESSION: No acute cardiopulmonary process. Electronically Signed  By: Suzy Bouchard M.D.   On: 08/17/2016 10:56    Procedures Procedures (including critical care time)  Medications Ordered in ED Medications - No data to display   Initial Impression / Assessment and Plan / ED Course  I have reviewed the triage vital signs and the nursing notes.  Pertinent labs & imaging results that were available during my care of the patient were reviewed by me and considered in my medical decision making (see chart for details).    Patient is a pleasant 51 year old female presenting with intermittent chest squeezing and palpitations. This is been going on for years but seem to be worse today. It does not seem to be associated with exertion or eating. She denies any shortness of breath but did feel lightheaded today which resolved with sitting down. Patient has only had EKGs in the past but has never had any cardiac workup. She does admit to having anemia in the past due to heavy vaginal bleeding but has been off iron for the last 2 years. She is unclear when her last hemoglobin was checked. She denies any  abdominal pain or symptoms suggestive for dissection or PE. Lower suspicion that this is related to GERD. She is under a lot of stress after the death of her daughter approximately 3 months ago which may be adding into her symptoms. However she does have risk factors for cardiac disease. EKG without acute findings. CBC, CMP, troponin and chest x-ray pending  11:41 AM Labs are all within normal limits except for a necrosed headache anemia. This could be the cause of the patient's chest pain versus stress and not sleeping well since the death of her daughter. We'll start back on iron and stressed the importance of following up with Dr. Quay Burow for ongoing evaluation. Repeat blood pressure today is 125/84.  Final Clinical Impressions(s) / ED Diagnoses   Final diagnoses:  Anemia, unspecified type  Atypical chest pain    New Prescriptions Current Discharge Medication List       Blanchie Dessert, MD 08/17/16 1143

## 2016-08-17 NOTE — ED Notes (Signed)
ED Provider at bedside. 

## 2016-08-17 NOTE — Telephone Encounter (Signed)
Noted. Pt has been checked in at ED

## 2016-08-17 NOTE — ED Triage Notes (Signed)
Pt verbalizes intermittent left chest pain and palpitations for over a year. Pt verbalizes palpitations constant today but denies pain at present time.

## 2016-08-29 DIAGNOSIS — D259 Leiomyoma of uterus, unspecified: Secondary | ICD-10-CM | POA: Diagnosis not present

## 2016-08-29 DIAGNOSIS — N83201 Unspecified ovarian cyst, right side: Secondary | ICD-10-CM | POA: Diagnosis not present

## 2016-08-29 DIAGNOSIS — N920 Excessive and frequent menstruation with regular cycle: Secondary | ICD-10-CM | POA: Diagnosis not present

## 2016-09-29 NOTE — Progress Notes (Deleted)
Subjective:    Patient ID: Erin [name], female    DOB: 1965/10/05, 51 y.o.   MRN: [id number]  HPI The patient is here for follow up from the ED.  Dec duagt died  She went to the ED August 28, 2016 for palpitations.  She has had intermittent palpitations for years.  She has episodic chest pressure, but the day she went to the ED it was worse. The chest pain was in the substernal region and squeezing  and did not radiate.  She had associated dizziness and SOB.  was having a lot of stress at this time due to her daughter's death 3 months earlier.   Her EKG had no acute findings.  Her labs were normal, except chronic anemia( heavy menses) and CXR was normal.   She was started back on iron.    Medications and allergies reviewed with patient and updated if appropriate.  Patient Active Problem List   Diagnosis Date Noted  . H/O gestational diabetes mellitus, not currently pregnant 09/28/2015  . Elevated BP 09/28/2015  . Prediabetes 09/28/2015  . Morbid obesity ([place]) 05/17/2015  . Umbilical hernia 24/23/5361  . Lumbar radiculopathy 05/17/2015  . Snoring 05/17/2015  . Pain in the chest 05/17/2015  . Palpitations 05/17/2015  . ANEMIA 09/03/2007  . Hypothyroidism following radioiodine therapy 09/02/2007  . INTERSTITIAL CYSTITIS 09/01/2007  . ALLERGIC RHINITIS 08/07/2007  . [place] DISEASE, LUMBAR 08/07/2007    Current Outpatient Prescriptions on File Prior to Visit  Medication Sig Dispense Refill  . Fe Fum-FePoly-Vit C-Vit B3 (INTEGRA) 62.5-62.5-40-3 MG CAPS Take 1 capsule by mouth daily. 30 capsule 5  . ibuprofen (ADVIL,MOTRIN) 200 MG tablet Take 600 mg by mouth every 6 (six) hours as needed for pain.    [name] levothyroxine (SYNTHROID, LEVOTHROID) 137 MCG tablet Take 1 tablet (137 mcg total) by mouth daily. 90 tablet 1  . levothyroxine (SYNTHROID, LEVOTHROID) 137 MCG tablet TAKE 1 TABLET (137 MCG TOTAL) BY MOUTH DAILY. 30 tablet 0  . Soft Lens Products (SALINE) SOLN 2 drops by Does not apply  route daily as needed (for eye problem).     No current facility-administered medications on file prior to visit.     Past Medical History:  Diagnosis Date  . Fibroids   . Thyroid disease     Past Surgical History:  Procedure Laterality Date  . APPENDECTOMY    . CESAREAN SECTION    . CHOLECYSTECTOMY    . HERNIA REPAIR    . TUMOR REMOVAL  fibroids    Social History   Social History  . Marital status: Married    Spouse name: N/A  . Number of children: N/A  . Years of education: N/A   Social History Main Topics  . Smoking status: Never Smoker  . Smokeless tobacco: Not on file  . Alcohol use No  . Drug use: No  . Sexual activity: Not on file   Other Topics Concern  . Not on file   Social History Narrative  . No narrative on file    Family History  Problem Relation Age of Onset  . Hypertension Mother   . Cancer Mother   . Hypertension Father   . Diabetes Father   . Cancer Father     Review of Systems     Objective:  There were no vitals filed for this visit. Wt Readings from Last 3 Encounters:  August 28, 2016 (!) 330 lb (149.7 kg)  03/14/16 (!) 330 lb (149.7 kg)  11/09/15 (!) 329  lb (149.2 kg)   There is no height or weight on file to calculate BMI.   Physical Exam    Constitutional: Appears well-developed and well-nourished. No distress.  HENT:  Head: Normocephalic and atraumatic.  Neck: Neck supple. No tracheal deviation present. No thyromegaly present.  No cervical lymphadenopathy Cardiovascular: Normal rate, regular rhythm and normal heart sounds.   No murmur heard. No carotid bruit .  No edema Pulmonary/Chest: Effort normal and breath sounds normal. No respiratory distress. No has no wheezes. No rales.  Skin: Skin is warm and dry. Not diaphoretic.  Psychiatric: Normal mood and affect. Behavior is normal.      Assessment & Plan:    See Problem List for Assessment and Plan of chronic medical problems.

## 2016-10-01 ENCOUNTER — Ambulatory Visit: Payer: BLUE CROSS/BLUE SHIELD | Admitting: Internal Medicine

## 2016-10-16 ENCOUNTER — Ambulatory Visit: Payer: BLUE CROSS/BLUE SHIELD | Admitting: Internal Medicine

## 2016-10-29 ENCOUNTER — Other Ambulatory Visit (INDEPENDENT_AMBULATORY_CARE_PROVIDER_SITE_OTHER): Payer: BLUE CROSS/BLUE SHIELD

## 2016-10-29 DIAGNOSIS — E890 Postprocedural hypothyroidism: Secondary | ICD-10-CM | POA: Diagnosis not present

## 2016-10-29 LAB — T4, FREE: Free T4: 1.17 ng/dL (ref 0.60–1.60)

## 2016-10-29 LAB — TSH: TSH: 0.16 u[IU]/mL — ABNORMAL LOW (ref 0.35–4.50)

## 2016-11-01 ENCOUNTER — Encounter: Payer: Self-pay | Admitting: Endocrinology

## 2016-11-01 ENCOUNTER — Ambulatory Visit (INDEPENDENT_AMBULATORY_CARE_PROVIDER_SITE_OTHER): Payer: BLUE CROSS/BLUE SHIELD | Admitting: Endocrinology

## 2016-11-01 VITALS — BP 152/104 | HR 90 | Ht 65.5 in | Wt 337.6 lb

## 2016-11-01 DIAGNOSIS — E890 Postprocedural hypothyroidism: Secondary | ICD-10-CM | POA: Diagnosis not present

## 2016-11-01 MED ORDER — LEVOTHYROXINE SODIUM 125 MCG PO TABS: 125.0000 ug | ORAL_TABLET | Freq: Every day | ORAL | 3 refills | Status: DC

## 2016-11-01 NOTE — Progress Notes (Signed)
Subjective:    Patient ID: Erin [name], female    DOB: [birth year], 51 y.o.   MRN: [id number]  HPI The patient is here for follow up from the ER.  She went to the ED 08/17/16 with chest pain and palpitations.  This was not new, but was worse the day she went to the ED.  She has some lightheadedness.  She was under a lot of stress related to her daughter's death 3 months prior.  An EKG was without acute changes.  Her labs, except for anemia, were normal, including a troponin.  CXR was normal.  She was discharged home.  Her daughter, 50 year old, died several months ago.  She had special needs, but her death was unexpected.  She is grieving. She is getting help from her family and church.  She would like to see a therapist outside of church.  She thinks she needs medication to help her through this.   This is the second child she lost.  Chest tightness - it feels like the blood vessel from her head to her chest is squeezing/hot sensation and her heart is beating a little fast.  She denies heart racing out side of these episodes.  Two weeks ago it was daily.  Now it is not daily.  It does not occur with any specific activities - it occurs randomly.  She has not had an episode today.  She does not exercise regularly.   Anemia:  She has seen her gyn recently.  She has the mirena.  She does not have heavy bleeding since having that placed, but still has bleeding.  She is taking the iron pill but not daily.  She does feel constipated - she is not taking a stool softener.    Elevated BP:  Her BP has been elevated the past couple of months - she had normal BP prior to that.  She thinks it is from losing her daughter.  Her thyroid is also off and her medication is being adjusted.   Thyroid:  She just saw Dr Dwyane Dee and her medication was adjusted.   Medications and allergies reviewed with patient and updated if appropriate.  Patient Active Problem List   Diagnosis Date Noted  . H/O gestational  diabetes mellitus, not currently pregnant 09/28/2015  . Elevated BP 09/28/2015  . Prediabetes 09/28/2015  . Morbid obesity ([place]) 05/17/2015  . Umbilical hernia 59/93/5701  . Lumbar radiculopathy 05/17/2015  . Snoring 05/17/2015  . Pain in the chest 05/17/2015  . Palpitations 05/17/2015  . ANEMIA 09/03/2007  . Hypothyroidism following radioiodine therapy 09/02/2007  . INTERSTITIAL CYSTITIS 09/01/2007  . ALLERGIC RHINITIS 08/07/2007  . [place] DISEASE, LUMBAR 08/07/2007    Current Outpatient Prescriptions on File Prior to Visit  Medication Sig Dispense Refill  . Fe Fum-FePoly-Vit C-Vit B3 (INTEGRA) 62.5-62.5-40-3 MG CAPS Take 1 capsule by mouth daily. 30 capsule 5  . ibuprofen (ADVIL,MOTRIN) 200 MG tablet Take 600 mg by mouth every 6 (six) hours as needed for pain.    [name] levothyroxine (SYNTHROID, LEVOTHROID) 125 MCG tablet Take 1 tablet (125 mcg total) by mouth daily. 30 tablet 3  . Soft Lens Products (SALINE) SOLN 2 drops by Does not apply route daily as needed (for eye problem).     No current facility-administered medications on file prior to visit.     Past Medical History:  Diagnosis Date  . Fibroids   . Thyroid disease     Past Surgical History:  Procedure  Laterality Date  . APPENDECTOMY    . CESAREAN SECTION    . CHOLECYSTECTOMY    . HERNIA REPAIR    . TUMOR REMOVAL  fibroids    Social History   Social History  . Marital status: Married    Spouse name: N/A  . Number of children: N/A  . Years of education: N/A   Social History Main Topics  . Smoking status: Never Smoker  . Smokeless tobacco: Never Used  . Alcohol use No  . Drug use: No  . Sexual activity: Not Asked   Other Topics Concern  . None   Social History Narrative  . None    Family History  Problem Relation Age of Onset  . Hypertension Mother   . Cancer Mother   . Hypertension Father   . Diabetes Father   . Cancer Father     Review of Systems  Constitutional: Negative for appetite  change and fever.  Respiratory: Positive for shortness of breath (not as much lately). Negative for cough and wheezing.   Cardiovascular: Positive for chest pain, palpitations and leg swelling (chronic).  Gastrointestinal: Negative for abdominal pain.  Neurological: Positive for light-headedness (intermittent) and headaches (with menses).  Psychiatric/Behavioral: Positive for dysphoric mood. The patient is not nervous/anxious.        Objective:   Vitals:   11/02/16 1613  BP: (!) 152/88  Pulse: 93  Resp: 16  Temp: 99.3 F (37.4 C)   Wt Readings from Last 3 Encounters:  11/02/16 (!) 337 lb (152.9 kg)  11/01/16 (!) 337 lb 9.6 oz (153.1 kg)  08/17/16 (!) 330 lb (149.7 kg)   Body mass index is 55.23 kg/m.   Physical Exam    Constitutional: Appears well-developed and well-nourished. No distress.  HENT:  Head: Normocephalic and atraumatic.  Neck: Neck supple. No tracheal deviation present. No thyromegaly present.  No cervical lymphadenopathy Cardiovascular: Normal rate, regular rhythm and normal heart sounds.   2/6 systolic murmur heard. No carotid bruit .  trace edema Pulmonary/Chest: Effort normal and breath sounds normal. No respiratory distress. No has no wheezes. No rales.  Skin: Skin is warm and dry. Not diaphoretic.  Psychiatric: depressed mood and affect. Behavior is normal.      Assessment & Plan:    See Problem List for Assessment and Plan of chronic medical problems.   FU in 4 weeks

## 2016-11-01 NOTE — Patient Instructions (Signed)
Take 137ug take 6 days a week

## 2016-11-01 NOTE — Progress Notes (Signed)
Erin [name] 51 y.o.   Reason for Appointment:  Hypothyroidism, followup visit    History of Present Illness:   The hypothyroidism was first diagnosed  after treatment of her Graves' disease, had I-131 treatment in 08/2008  Her dose had been adjusted previously and she had been taking a lower dose of 50 g for some time with stable levels However in 6/15 her TSH was significantly high at 8.47 despite being compliant with her medication. With increasing her dose to 75 g she did not seem to have any significant change in her energy level  Although her TSH was normal in 01/2014 it was significantly higher in 2/16 Her doses have been adjusted fairly regularly since 2015  Subsequently with 112 g her TSH had been fluctuating somewhat On her last visit in 9/17 her TSH was unusually high at 10.5, she was having nonspecific fatigue Although she was told to take 137 g since then she does not remember if she felt better with this Has not come back for follow-up until today  She still complains of fatigue most of the time but also has had more stress Has gained little weight recently, she is having anxiety episodes and occasionally feels feel hot but no shakiness  She takes her medication before breakfast consistently and is taking iron pills later in the day      Lab Results  Component Value Date   TSH 0.16 (L) 10/29/2016   TSH 10.45 (H) 02/14/2016   TSH 0.22 (L) 07/25/2015   FREET4 1.17 10/29/2016   FREET4 0.72 02/14/2016   FREET4 1.07 07/25/2015    Wt Readings from Last 3 Encounters:  11/01/16 (!) 337 lb 9.6 oz (153.1 kg)  08/17/16 (!) 330 lb (149.7 kg)  03/14/16 (!) 330 lb (149.7 kg)     Allergies as of 11/01/2016      Reactions   Hydrocodone Hives, Itching, Rash   On thighs      Medication List       Accurate as of 11/01/16  9:16 PM. Always use your most recent med list.          ibuprofen 200 MG tablet Commonly known as:  ADVIL,MOTRIN Take 600 mg  by mouth every 6 (six) hours as needed for pain.   INTEGRA 62.5-62.5-40-3 MG Caps Take 1 capsule by mouth daily.   levothyroxine 125 MCG tablet Commonly known as:  SYNTHROID, LEVOTHROID Take 1 tablet (125 mcg total) by mouth daily.   Saline Soln 2 drops by Does not apply route daily as needed (for eye problem).       Allergies:  Allergies  Allergen Reactions  . Hydrocodone Hives, Itching and Rash    On thighs     Past Medical History:  Diagnosis Date  . Fibroids   . Thyroid disease     Past Surgical History:  Procedure Laterality Date  . APPENDECTOMY    . CESAREAN SECTION    . CHOLECYSTECTOMY    . HERNIA REPAIR    . TUMOR REMOVAL  fibroids    Family History  Problem Relation Age of Onset  . Hypertension Mother   . Cancer Mother   . Hypertension Father   . Diabetes Father   . Cancer Father     Social History:  reports that she has never smoked. She has never used smokeless tobacco. She reports that she does not drink alcohol or use drugs.  REVIEW Of SYSTEMS:   History of iron deficiency anemia followed by  PCP  BP Readings from Last 3 Encounters:  11/01/16 (!) 152/104  08/17/16 125/88  03/14/16 115/82      Examination:   BP (!) 152/104   Pulse 90   Ht 5' 5.5" (1.664 m)   Wt (!) 337 lb 9.6 oz (153.1 kg)   SpO2 98%   BMI 55.33 kg/m   She looks well Biceps reflexes are difficult to elicit, appear normal Skin normal No swelling of hands or face    Assessments   Hypothyroidism, Post ablative She has previously gradually required higher doses of thyroid supplement since 2015 Currently taking 137 g generic levothyroxine  Even though her TSH was around 10 and her dose was increased by 25 g it is now low Difficult to assess her symptoms that she has nonspecific fatigue Also recently has had a lot of stress and anxiety and doubt if she is symptomatic from her mildly decreased TSH and normal free T4  She very compliant with her thyroid  supplement and not taking her iron at the same time    Treatment:  Since she has a supply of the 137 g she can take this 6 days a week Next prescription will be 125 g once a day  Emphasized the need for taking the levothyroxine before breakfast and her iron tablets later in the day  She will follow-up with her PCP tomorrow for hypertension  She will need a follow-up in 2 months      Roxanna Mcever 11/01/2016, 9:16 PM

## 2016-11-02 ENCOUNTER — Encounter: Payer: Self-pay | Admitting: Internal Medicine

## 2016-11-02 ENCOUNTER — Ambulatory Visit (INDEPENDENT_AMBULATORY_CARE_PROVIDER_SITE_OTHER): Payer: BLUE CROSS/BLUE SHIELD | Admitting: Internal Medicine

## 2016-11-02 VITALS — BP 152/88 | HR 93 | Temp 99.3°F | Resp 16 | Wt 337.0 lb

## 2016-11-02 DIAGNOSIS — D509 Iron deficiency anemia, unspecified: Secondary | ICD-10-CM | POA: Diagnosis not present

## 2016-11-02 DIAGNOSIS — Z634 Disappearance and death of family member: Secondary | ICD-10-CM

## 2016-11-02 DIAGNOSIS — E890 Postprocedural hypothyroidism: Secondary | ICD-10-CM

## 2016-11-02 DIAGNOSIS — R0789 Other chest pain: Secondary | ICD-10-CM | POA: Insufficient documentation

## 2016-11-02 DIAGNOSIS — R030 Elevated blood-pressure reading, without diagnosis of hypertension: Secondary | ICD-10-CM

## 2016-11-02 DIAGNOSIS — F4321 Adjustment disorder with depressed mood: Secondary | ICD-10-CM | POA: Diagnosis not present

## 2016-11-02 MED ORDER — FLUOXETINE HCL 20 MG PO TABS: 20.0000 mg | ORAL_TABLET | Freq: Every day | ORAL | 3 refills | Status: DC

## 2016-11-02 NOTE — Assessment & Plan Note (Signed)
Management per Dr Dwyane Dee Medication just adjusted

## 2016-11-02 NOTE — Assessment & Plan Note (Signed)
Just saw gyn - has mirena and bleeding is still there and can last 3 weeks, but not heavy Taking iron most days Advised taking stool softener daily with iron pill Will recheck blood work at her next visit

## 2016-11-02 NOTE — Assessment & Plan Note (Signed)
Overall doing well but we both agree she could use additional help Start prozac 20 mg daily and f/u in 4 weeks  Referral to therapist Continue with support from pastor/church

## 2016-11-02 NOTE — Assessment & Plan Note (Signed)
Intermittent - may be from stress of losing her daughter, but has had chest pain in the pain Echo ordered a while ago and did not have it done Will refer to cardio for further evaluation Start medication for depression/grief thyroid managed by endo

## 2016-11-02 NOTE — Patient Instructions (Addendum)
   Medications reviewed and updated.  Changes include starting prozac daily.   Your prescription(s) have been submitted to your pharmacy. Please take as directed and contact our office if you believe you are having problem(s) with the medication(s).  A referral was ordered for a therapist and cardiology.   Please followup in 4 weeks

## 2016-11-02 NOTE — Assessment & Plan Note (Signed)
Has been elevated - possibly from thyroid or increase stress/grieving If elevated at her next visit will need to start low dose medication

## 2016-11-06 ENCOUNTER — Telehealth: Payer: Self-pay | Admitting: *Deleted

## 2016-11-06 NOTE — Telephone Encounter (Signed)
Pt left msg on triage stating wanting to let MD know she can not take the fluoxetine. She took med for 4 days, but as of today she is not going to take anymore. Med makes her feel sick, very nauseas on the stomach...Erin [name]

## 2016-11-06 NOTE — Telephone Encounter (Signed)
Tried calling pt no answer x's 10 rings.../lmb 

## 2016-11-06 NOTE — Telephone Encounter (Signed)
Does she want to try something different

## 2016-11-07 NOTE — Telephone Encounter (Signed)
Tried calling pt again still no answer x's 10 rings 7 no vm...Erin [name]

## 2016-11-08 MED ORDER — LOSARTAN POTASSIUM 50 MG PO TABS: 50.0000 mg | ORAL_TABLET | Freq: Every day | ORAL | 3 refills | Status: DC

## 2016-11-08 NOTE — Telephone Encounter (Signed)
Losartan 50 mg daily sent to [facility].  Take one daily.  This may not be enough - we may need to adjust.  Keep monitoring BP.  F/u with me as scheduled in June - call sooner if BP still high

## 2016-11-08 NOTE — Telephone Encounter (Signed)
Rec'd call from pt returning call back from msg below. Pt states she does not want to replace the fluoxetine, but her BP has been running high. She states MD told her to keep checking her BP so on Sat it was 182/92. Check this am it is 146/104. Think she need to be on something for her BP. She states she is at work @ [phone], and to give call back there...Johny Chess

## 2016-11-08 NOTE — Telephone Encounter (Signed)
Notified pt w/MD response.../lmb 

## 2016-11-20 ENCOUNTER — Telehealth: Payer: Self-pay | Admitting: Internal Medicine

## 2016-11-20 NOTE — Telephone Encounter (Signed)
Referral ordered at time of visit

## 2016-11-20 NOTE — Telephone Encounter (Signed)
Pt called regarding her referral for a christian therapists. Would like one put in

## 2016-11-21 NOTE — Telephone Encounter (Signed)
Spoke with Cecille Rubin, [place] contacted pt on 5/11 and no response. Tried contacting pt to inform, unable to LVM.

## 2016-11-22 NOTE — Telephone Encounter (Signed)
Tried contacting pt, unable to LVM 

## 2016-12-03 ENCOUNTER — Ambulatory Visit: Payer: BLUE CROSS/BLUE SHIELD | Admitting: Internal Medicine

## 2016-12-05 ENCOUNTER — Encounter: Payer: Self-pay | Admitting: Physician Assistant

## 2016-12-05 ENCOUNTER — Encounter (INDEPENDENT_AMBULATORY_CARE_PROVIDER_SITE_OTHER): Payer: Self-pay

## 2016-12-05 ENCOUNTER — Ambulatory Visit (INDEPENDENT_AMBULATORY_CARE_PROVIDER_SITE_OTHER): Payer: BLUE CROSS/BLUE SHIELD | Admitting: Physician Assistant

## 2016-12-05 VITALS — BP 126/74 | HR 80 | Ht 65.0 in | Wt 339.1 lb

## 2016-12-05 DIAGNOSIS — I10 Essential (primary) hypertension: Secondary | ICD-10-CM

## 2016-12-05 DIAGNOSIS — R002 Palpitations: Secondary | ICD-10-CM

## 2016-12-05 DIAGNOSIS — E039 Hypothyroidism, unspecified: Secondary | ICD-10-CM

## 2016-12-05 DIAGNOSIS — R0789 Other chest pain: Secondary | ICD-10-CM | POA: Diagnosis not present

## 2016-12-05 NOTE — Patient Instructions (Signed)
Medication Instructions:  None Ordered   Labwork: Your physician recommends that you return for lab work today for BMET and MAGNESIUM   Testing/Procedures: Your physician has requested that you have an echocardiogram. Echocardiography is a painless test that uses sound waves to create images of your heart. It provides your doctor with information about the size and shape of your heart and how well your heart's chambers and valves are working. This procedure takes approximately one hour. There are no restrictions for this procedure.  Your physician has recommended that you wear an event monitor. Event monitors are medical devices that record the heart's electrical activity. Doctors most often [place] these monitors to diagnose arrhythmias. Arrhythmias are problems with the speed or rhythm of the heartbeat. The monitor is a small, portable device. You can wear one while you do your normal daily activities. This is usually used to diagnose what is causing palpitations/syncope (passing out).    Follow-Up: Your physician recommends that you schedule a follow-up appointment in: 6 weeks with Melina Copa  Any Other Special Instructions Will Be Listed Below (If Applicable).     If you need a refill on your cardiac medications before your next appointment, please call your pharmacy.  Thank you for choosing [facility]

## 2016-12-05 NOTE — Progress Notes (Addendum)
Cardiology Office Note    Date:  12/05/2016  ID:  Erin [name], DOB [birth year], MRN [id number] PCP:  Erin Rail, MD  Cardiologist:  New, reviewed with Dr. Johnsie Cancel   Chief Complaint: palpitations  History of Present Illness:  Erin [name] is a 51 y.o. female with history of gestational DM/pre-DM, morbid obesity, anemia, menorrhagia, uterine leiomyoma, endometriosis, hypothyroidism (following tx for Graves disease), elevated BP who is being seen today for the evaluation of chest tightness and palpitations at the request of Dr. Quay Burow.   Erin [name] has no prior cardiac history but a family history of such - her paternal grandmother apparently died unexpectedly at 50 years old and they were told her heart "burst." Her paternal aunt died in a similar fashion. 2 years ago Erin [name] began having episodes of palpitations associated with presyncope and chest tightness. She had an echo set up but symptoms resolved at that time. In October 2018 she lost her 7-year-old daughter. This is the second child that she has lost. Her 85-year-old had special needs (seizures, feeding tube), but death was unexpected following a vagus nerve stimulation surgery. Erin [name] states the medical examiner said she had had both a seizure and a heart attack. Since that time Erin [name] has begun having recurrent palpitations about twice a week, lasting about 5 minutes at a time. Most of the episodes wake her abruptly out of sleep but they can happen during the day as well. One episode happened at work. This is associated with a chest squeezing sensation. To resolve the symptoms she calms herself down, breathes deeply, and reads scripture. She denies any recurrent syncope or presyncope. She does occasionally also have a sharp stabbing chest pain that lasts a few seconds, occurs every 2-3 months. She does not exercise. She works as a Licensed conveyancer. She has not noticed any chest pain or dyspnea with exertional activities such as  walking/ADLs. She feels well today.  She was seen in the ED 08/17/16 with chest discomfort and ruled out for MI. Hgb was 10.6 - microcytic, WBC 10.9 (elevated back to 2012 as well), K 4.1, Cr 0.86, CXR NAD. Last TSH in 10/2016 was 0.16 prompting adjustment in meds by endocrinology.    Past Medical History:  Diagnosis Date  . Elevated blood-pressure reading without diagnosis of hypertension   . Endometriosis   . Fibroids   . Gestational diabetes   . Graves disease   . Grief at loss of child   . Hypothyroidism   . Menorrhagia   . Thyroid disease   . Uterine leiomyoma     Past Surgical History:  Procedure Laterality Date  . APPENDECTOMY    . CESAREAN SECTION    . CHOLECYSTECTOMY    . HERNIA REPAIR    . TUMOR REMOVAL  fibroids    Current Medications: Current Outpatient Prescriptions  Medication Sig Dispense Refill  . Cholecalciferol (VITAMIN D3) 1000 UNIT/SPRAY LIQD Vitamin D3  daily    . Fe Fum-FePoly-Vit C-Vit B3 (INTEGRA) 62.5-62.5-40-3 MG CAPS Take 1 capsule by mouth daily. 30 capsule 5  . ibuprofen (ADVIL,MOTRIN) 200 MG tablet Take 600 mg by mouth every 6 (six) hours as needed for pain.    [name] levonorgestrel (MIRENA, 52 MG,) 20 MCG/24HR IUD Mirena 20 mcg/24 hr (5 years) intrauterine device  Take 1 device by intrauterine route.    [name] levothyroxine (SYNTHROID, LEVOTHROID) 125 MCG tablet Take 1 tablet (125 mcg total) by mouth daily. 30 tablet 3  .  losartan (COZAAR) 50 MG tablet Take 1 tablet (50 mg total) by mouth daily. 90 tablet 3  . Soft Lens Products (SALINE) SOLN 2 drops by Does not apply route daily as needed (for eye problem).     No current facility-administered medications for this visit.      Allergies:   Prozac [fluoxetine hcl] and Hydrocodone   Social History   Social History  . Marital status: Married    Spouse name: N/A  . Number of children: N/A  . Years of education: N/A   Social History Main Topics  . Smoking status: Never Smoker  . Smokeless tobacco:  Never Used  . Alcohol use No  . Drug use: No  . Sexual activity: Not Asked   Other Topics Concern  . None   Social History Narrative  . None     Family History:  Family History  Problem Relation Age of Onset  . Hypertension Mother   . Cancer Mother   . Hypertension Father   . Diabetes Father   . Cancer Father     ROS:   Please see the history of present illness. All other systems are reviewed and otherwise negative.    PHYSICAL EXAM:   VS:  BP 126/74 (BP Location: Left Arm)   Pulse 80   Ht 5\' 5"  (1.651 m)   Wt (!) 339 lb 1.9 oz (153.8 kg)   BMI 56.43 kg/m   BMI: Body mass index is 56.43 kg/m. GEN: Well nourished, well developed morbidly obese AAF, in no acute distress  HEENT: normocephalic, atraumatic Neck: no JVD, carotid bruits, or masses Cardiac: RRR; no murmurs, rubs, or gallops, no edema  Respiratory:  clear to auscultation bilaterally, normal work of breathing GI: soft, nontender, nondistended, + BS MS: no deformity or atrophy  Skin: warm and dry, no rash Neuro:  Alert and Oriented x 3, Strength and sensation are intact, follows commands Psych: euthymic mood, full affect  Wt Readings from Last 3 Encounters:  12/05/16 (!) 339 lb 1.9 oz (153.8 kg)  11/02/16 (!) 337 lb (152.9 kg)  11/01/16 (!) 337 lb 9.6 oz (153.1 kg)      Studies/Labs Reviewed:   EKG:  EKG was ordered today and personally reviewed by me and demonstrates NSR 83bpm, somewhat lower voltage QRS, no acute ST-T changes. PR, QRS and QTc intervals wnl.  Recent Labs: 08/17/2016: BUN 15; Creatinine, Ser 0.86; Hemoglobin 10.6; Platelets 384; Potassium 4.1; Sodium 140 10/29/2016: TSH 0.16   Lipid Panel    Component Value Date/Time   CHOL 187 09/28/2015 1454   TRIG 89.0 09/28/2015 1454   HDL 43.70 09/28/2015 1454   CHOLHDL 4 09/28/2015 1454   VLDL 17.8 09/28/2015 1454   LDLCALC 126 (H) 09/28/2015 1454    Additional studies/ records that were reviewed today include: Summarized  above.    ASSESSMENT & PLAN:   This patient's case was discussed in depth with Dr. Johnsie Cancel. The plan below was formulated per our discussion.  1. Heart palpitations - mixed features - would recommend to obtain 30-day monitor to further characterize what these represent. Certainly the loss of her daughter could be contributing to some anxiety, but her body habitus and family history of unusual cardiac death warrant event monitoring. Would also recommend 2D echo to ensure normal cardiac structure and function. Check BMET and Mg with labs today. TSH is being followed by endocrinology.  2. Atypical chest pain - no high risk features of discomfort - stabbing and infrequent. Will follow  up event monitor to see if any correlation with arrhythmia. EKG without acute ST-T change today. F/u echocardiogram for wall motion. 3. Essential HTN - recent phone notes indicate high BP on occasion, prompting initiation of ARB. BP is near normal today. Follow. If BP begins to creep up again, would consider sleep study given morbid obesity. 4. Hypothyroidism - being followed closely by endocrinology.  Disposition: F/u with me or Dr. Johnsie Cancel in 6 weeks after above testing is complete.   Medication Adjustments/Labs and Tests Ordered: Current medicines are reviewed at length with the patient today.  Concerns regarding medicines are outlined above. Medication changes, Labs and Tests ordered today are summarized above and listed in the Patient Instructions accessible in Encounters.   Signed, Charlie Pitter, PA-C  12/05/2016 3:34 PM    [facility] [address], [place], [place]  [address] Phone: [phone]; Fax: [phone]

## 2016-12-06 LAB — BASIC METABOLIC PANEL
BUN/Creatinine Ratio: 14 (ref 9–23)
BUN: 12 mg/dL (ref 6–24)
CO2: 26 mmol/L (ref 20–29)
Calcium: 9.4 mg/dL (ref 8.7–10.2)
Chloride: 102 mmol/L (ref 96–106)
Creatinine, Ser: 0.83 mg/dL (ref 0.57–1.00)
GFR calc Af Amer: 95 mL/min/{1.73_m2} (ref >59)
GFR calc non Af Amer: 82 mL/min/{1.73_m2} (ref >59)
Glucose: 83 mg/dL (ref 65–99)
Potassium: 4.5 mmol/L (ref 3.5–5.2)
Sodium: 140 mmol/L (ref 134–144)

## 2016-12-06 LAB — MAGNESIUM: Magnesium: 2.2 mg/dL (ref 1.6–2.3)

## 2016-12-06 NOTE — Addendum Note (Signed)
Addended by: Hosie Poisson R on: 12/06/2016 01:22 PM   Modules accepted: Orders

## 2016-12-19 ENCOUNTER — Other Ambulatory Visit: Payer: Self-pay

## 2016-12-19 ENCOUNTER — Ambulatory Visit (HOSPITAL_COMMUNITY): Payer: BLUE CROSS/BLUE SHIELD | Attending: Cardiovascular Disease

## 2016-12-19 ENCOUNTER — Ambulatory Visit (INDEPENDENT_AMBULATORY_CARE_PROVIDER_SITE_OTHER): Payer: BLUE CROSS/BLUE SHIELD

## 2016-12-19 ENCOUNTER — Other Ambulatory Visit: Payer: Self-pay | Admitting: Physician Assistant

## 2016-12-19 DIAGNOSIS — R0789 Other chest pain: Secondary | ICD-10-CM

## 2016-12-19 DIAGNOSIS — R002 Palpitations: Secondary | ICD-10-CM

## 2016-12-19 DIAGNOSIS — I351 Nonrheumatic aortic (valve) insufficiency: Secondary | ICD-10-CM | POA: Insufficient documentation

## 2016-12-20 DIAGNOSIS — R002 Palpitations: Secondary | ICD-10-CM | POA: Diagnosis not present

## 2016-12-28 ENCOUNTER — Other Ambulatory Visit: Payer: BLUE CROSS/BLUE SHIELD

## 2017-01-02 ENCOUNTER — Ambulatory Visit: Payer: BLUE CROSS/BLUE SHIELD | Admitting: Endocrinology

## 2017-01-22 ENCOUNTER — Telehealth: Payer: Self-pay | Admitting: Physician Assistant

## 2017-01-22 NOTE — Telephone Encounter (Signed)
New message ° ° °Patient returning call back to nurse.  °

## 2017-01-22 NOTE — Telephone Encounter (Signed)
Patient aware of cardiac event monitor.

## 2017-02-06 ENCOUNTER — Ambulatory Visit: Payer: BLUE CROSS/BLUE SHIELD | Admitting: Physician Assistant

## 2017-02-06 ENCOUNTER — Encounter: Payer: Self-pay | Admitting: Physician Assistant

## 2017-02-06 NOTE — Progress Notes (Deleted)
Cardiology Office Note    Date:  02/06/2017  ID:  Erin [name], DOB 22-Sep-1965, MRN [id number] PCP:  Binnie Rail, MD  Cardiologist:  Reviewed with Dr. Johnsie Cancel last visit   Chief Complaint: f/u palpitations  History of Present Illness:  Erin [name] is a 51 y.o. female with history of gestational DM/pre-DM, morbid obesity, anemia, menorrhagia, uterine leiomyoma, endometriosis, hypothyroidism (following tx for Graves disease), elevated BP, rare PVCs who presents to f/u chest tightness and palpitations.  She is a Licensed conveyancer. Erin [name] has no prior cardiac history but a family history of such - her paternal grandmother apparently died unexpectedly at 50 years old and they were told her heart "burst." Her paternal aunt died in a similar fashion. 2 years ago Erin [name] began having episodes of palpitations associated with presyncope and chest tightness. She had an echo set up but symptoms resolved at that time. In October 2018 she lost her 66-year-old daughter. This is the second child that she has lost. Her 19-year-old had special needs (seizures, feeding tube), but death was unexpected following a vagus nerve stimulation surgery. Erin [name] states the medical examiner said she had had both a seizure and a heart attack. After that time, Erin [name] began having recurrent palpitations about twice a week, lasting about 5 minutes at a time. Most of the episodes would wake her abruptly out of sleep but they could happen during the day as well, associated with a chest squeezing sensation. To resolve symptoms, she would calm herself down, breathe deeply, and read scripture. She also noted occasional sharp stabbing chest pain lasting a few seconds, every 2-3 months. She does not exercise. She works as a Licensed conveyancer. She had not noticed any chest pain or dyspnea with exertional activities such as walking/ADLs. She was seen in the ED 08/17/16 with chest discomfort and ruled out for MI. Hgb was 10.6 -  microcytic, WBC 10.9 (elevated back to 2012 as well), BMET unremarkable, CXR NAD. Last TSH in 10/2016 was 0.16 prompting adjustment in meds by endocrinology. Her BMET and Mg on 12/05/16 were normal as well. We undertook an echo and event monitor. 2D echo 12/19/16 showed mild LVH, EF 60-65%, grade 1 DD, trivial AI. Event monitor showed NSR with isolated PVCs, no significant arrhythmias, symptoms did not correlate with PVCs all the time.    Heart palpitations Atypical chest pain Essential HTN Morbid obesity    Past Medical History:  Diagnosis Date  . Elevated blood-pressure reading without diagnosis of hypertension   . Endometriosis   . Fibroids   . Gestational diabetes   . Graves disease   . Grief at loss of child   . Hypothyroidism   . Menorrhagia   . Premature ventricular contractions    a. rare PVC by monitor 12/2016.  [name] Thyroid disease   . Uterine leiomyoma     Past Surgical History:  Procedure Laterality Date  . APPENDECTOMY    . CESAREAN SECTION    . CHOLECYSTECTOMY    . HERNIA REPAIR    . TUMOR REMOVAL  fibroids    Current Medications: No outpatient prescriptions have been marked as taking for the 02/06/17 encounter (Appointment) with Charlie Pitter, PA-C.     Allergies:   Prozac [fluoxetine hcl] and Hydrocodone   Social History   Social History  . Marital status: Married    Spouse name: N/A  . Number of children: N/A  . Years of education: N/A  Social History Main Topics  . Smoking status: Never Smoker  . Smokeless tobacco: Never Used  . Alcohol use No  . Drug use: No  . Sexual activity: Not on file   Other Topics Concern  . Not on file   Social History Narrative  . No narrative on file     Family History:  Family History  Problem Relation Age of Onset  . Hypertension Mother   . Cancer Mother   . Hypertension Father   . Diabetes Father   . Cancer Father    ***  ROS:   Please see the history of present illness. Otherwise, review of systems  is positive for ***.  All other systems are reviewed and otherwise negative.    PHYSICAL EXAM:   VS:  There were no vitals taken for this visit.  BMI: There is no height or weight on file to calculate BMI. GEN: Well nourished, well developed, in no acute distress  HEENT: normocephalic, atraumatic Neck: no JVD, carotid bruits, or masses Cardiac: ***RRR; no murmurs, rubs, or gallops, no edema  Respiratory:  clear to auscultation bilaterally, normal work of breathing GI: soft, nontender, nondistended, + BS MS: no deformity or atrophy  Skin: warm and dry, no rash Neuro:  Alert and Oriented x 3, Strength and sensation are intact, follows commands Psych: euthymic mood, full affect  Wt Readings from Last 3 Encounters:  12/05/16 (!) 339 lb 1.9 oz (153.8 kg)  11/02/16 (!) 337 lb (152.9 kg)  11/01/16 (!) 337 lb 9.6 oz (153.1 kg)      Studies/Labs Reviewed:   EKG:  EKG was ordered today and personally reviewed by me and demonstrates *** EKG was not ordered today.***  Recent Labs: 08/17/2016: Hemoglobin 10.6; Platelets 384 10/29/2016: TSH 0.16 12/05/2016: BUN 12; Creatinine, Ser 0.83; Magnesium 2.2; Potassium 4.5; Sodium 140   Lipid Panel    Component Value Date/Time   CHOL 187 09/28/2015 1454   TRIG 89.0 09/28/2015 1454   HDL 43.70 09/28/2015 1454   CHOLHDL 4 09/28/2015 1454   VLDL 17.8 09/28/2015 1454   LDLCALC 126 (H) 09/28/2015 1454    Additional studies/ records that were reviewed today include: Summarized above.***    ASSESSMENT & PLAN:   1. ***  Disposition: F/u with ***   Medication Adjustments/Labs and Tests Ordered: Current medicines are reviewed at length with the patient today.  Concerns regarding medicines are outlined above. Medication changes, Labs and Tests ordered today are summarized above and listed in the Patient Instructions accessible in Encounters.   Signed, Charlie Pitter, PA-C  02/06/2017 12:10 PM    [facility] [address], [place], [place]  [address] Phone: [phone]; Fax: [phone]

## 2017-02-07 ENCOUNTER — Encounter: Payer: Self-pay | Admitting: Physician Assistant

## 2017-05-25 ENCOUNTER — Other Ambulatory Visit: Payer: Self-pay | Admitting: Endocrinology

## 2017-06-10 NOTE — Patient Instructions (Addendum)
Start taking 1000 units of vitamin d daily.  Start tumeric and tart cherry juice for a natural anti-inflammatory.  Test(s) ordered today. Your results will be released to [place] (or called to you) after review, usually within 72hours after test completion. If any changes need to be made, you will be notified at that same time.  All other Health Maintenance issues reviewed.   All recommended immunizations and age-appropriate screenings are up-to-date or discussed.  No immunizations administered today.   Medications reviewed and updated.  No changes recommended at this time.  Your prescription(s) have been submitted to your pharmacy. Please take as directed and contact our office if you believe you are having problem(s) with the medication(s).  A referral was ordered for pulmonary for testing for sleep apnea and GI for a colonoscopy.  Please followup in 6 months   Health Maintenance, Female Adopting a healthy lifestyle and getting preventive care can go a long way to promote health and wellness. Talk with your health care provider about what schedule of regular examinations is right for you. This is a good chance for you to check in with your provider about disease prevention and staying healthy. In between checkups, there are plenty of things you can do on your own. Experts have done a lot of research about which lifestyle changes and preventive measures are most likely to keep you healthy. Ask your health care provider for more information. Weight and diet Eat a healthy diet  Be sure to include plenty of vegetables, fruits, low-fat dairy products, and lean protein.  Do not eat a lot of foods high in solid fats, added sugars, or salt.  Get regular exercise. This is one of the most important things you can do for your health. ? Most adults should exercise for at least 150 minutes each week. The exercise should increase your heart rate and make you sweat (moderate-intensity  exercise). ? Most adults should also do strengthening exercises at least twice a week. This is in addition to the moderate-intensity exercise.  Maintain a healthy weight  Body mass index (BMI) is a measurement that can be used to identify possible weight problems. It estimates body fat based on height and weight. Your health care provider can help determine your BMI and help you achieve or maintain a healthy weight.  For females 39 years of age and older: ? A BMI below 18.5 is considered underweight. ? A BMI of 18.5 to 24.9 is normal. ? A BMI of 25 to 29.9 is considered overweight. ? A BMI of 30 and above is considered obese.  Watch levels of cholesterol and blood lipids  You should start having your blood tested for lipids and cholesterol at 50 years of age, then have this test every 5 years.  You may need to have your cholesterol levels checked more often if: ? Your lipid or cholesterol levels are high. ? You are older than 50 years of age. ? You are at high risk for heart disease.  Cancer screening Lung Cancer  Lung cancer screening is recommended for adults 45-71 years old who are at high risk for lung cancer because of a history of smoking.  A yearly low-dose CT scan of the lungs is recommended for people who: ? Currently smoke. ? Have quit within the past 15 years. ? Have at least a 30-pack-year history of smoking. A pack year is smoking an average of one pack of cigarettes a day for 1 year.  Yearly screening should continue  until it has been 15 years since you quit.  Yearly screening should stop if you develop a health problem that would prevent you from having lung cancer treatment.  Breast Cancer  Practice breast self-awareness. This means understanding how your breasts normally appear and feel.  It also means doing regular breast self-exams. Let your health care provider know about any changes, no matter how small.  If you are in your 20s or 30s, you should have a  clinical breast exam (CBE) by a health care provider every 1-3 years as part of a regular health exam.  If you are 29 or older, have a CBE every year. Also consider having a breast X-ray (mammogram) every year.  If you have a family history of breast cancer, talk to your health care provider about genetic screening.  If you are at high risk for breast cancer, talk to your health care provider about having an MRI and a mammogram every year.  Breast cancer gene (BRCA) assessment is recommended for women who have family members with BRCA-related cancers. BRCA-related cancers include: ? Breast. ? Ovarian. ? Tubal. ? Peritoneal cancers.  Results of the assessment will determine the need for genetic counseling and BRCA1 and BRCA2 testing.  Cervical Cancer Your health care provider may recommend that you be screened regularly for cancer of the pelvic organs (ovaries, uterus, and vagina). This screening involves a pelvic examination, including checking for microscopic changes to the surface of your cervix (Pap test). You may be encouraged to have this screening done every 3 years, beginning at age 19.  For women ages 48-65, health care providers may recommend pelvic exams and Pap testing every 3 years, or they may recommend the Pap and pelvic exam, combined with testing for human papilloma virus (HPV), every 5 years. Some types of HPV increase your risk of cervical cancer. Testing for HPV may also be done on women of any age with unclear Pap test results.  Other health care providers may not recommend any screening for nonpregnant women who are considered low risk for pelvic cancer and who do not have symptoms. Ask your health care provider if a screening pelvic exam is right for you.  If you have had past treatment for cervical cancer or a condition that could lead to cancer, you need Pap tests and screening for cancer for at least 20 years after your treatment. If Pap tests have been discontinued,  your risk factors (such as having a new sexual partner) need to be reassessed to determine if screening should resume. Some women have medical problems that increase the chance of getting cervical cancer. In these cases, your health care provider may recommend more frequent screening and Pap tests.  Colorectal Cancer  This type of cancer can be detected and often prevented.  Routine colorectal cancer screening usually begins at 50 years of age and continues through 50 years of age.  Your health care provider may recommend screening at an earlier age if you have risk factors for colon cancer.  Your health care provider may also recommend using home test kits to check for hidden blood in the stool.  A small camera at the end of a tube can be used to examine your colon directly (sigmoidoscopy or colonoscopy). This is done to check for the earliest forms of colorectal cancer.  Routine screening usually begins at age 72.  Direct examination of the colon should be repeated every 5-10 years through 50 years of age. However, you may need  to be screened more often if early forms of precancerous polyps or small growths are found.  Skin Cancer  Check your skin from head to toe regularly.  Tell your health care provider about any new moles or changes in moles, especially if there is a change in a mole's shape or color.  Also tell your health care provider if you have a mole that is larger than the size of a pencil eraser.  Always use sunscreen. Apply sunscreen liberally and repeatedly throughout the day.  Protect yourself by wearing long sleeves, pants, a wide-brimmed hat, and sunglasses whenever you are outside.  Heart disease, diabetes, and high blood pressure  High blood pressure causes heart disease and increases the risk of stroke. High blood pressure is more likely to develop in: ? People who have blood pressure in the high end of the normal range (130-139/85-89 mm Hg). ? People who are  overweight or obese. ? People who are African American.  If you are 93-72 years of age, have your blood pressure checked every 3-5 years. If you are 74 years of age or older, have your blood pressure checked every year. You should have your blood pressure measured twice-once when you are at a hospital or clinic, and once when you are not at a hospital or clinic. Record the average of the two measurements. To check your blood pressure when you are not at a hospital or clinic, you can use: ? An automated blood pressure machine at a pharmacy. ? A home blood pressure monitor.  If you are between 51 years and 79 years old, ask your health care provider if you should take aspirin to prevent strokes.  Have regular diabetes screenings. This involves taking a blood sample to check your fasting blood sugar level. ? If you are at a normal weight and have a low risk for diabetes, have this test once every three years after 50 years of age. ? If you are overweight and have a high risk for diabetes, consider being tested at a younger age or more often. Preventing infection Hepatitis B  If you have a higher risk for hepatitis B, you should be screened for this virus. You are considered at high risk for hepatitis B if: ? You were born in a country where hepatitis B is common. Ask your health care provider which countries are considered high risk. ? Your parents were born in a high-risk country, and you have not been immunized against hepatitis B (hepatitis B vaccine). ? You have HIV or AIDS. ? You use needles to inject street drugs. ? You live with someone who has hepatitis B. ? You have had sex with someone who has hepatitis B. ? You get hemodialysis treatment. ? You take certain medicines for conditions, including cancer, organ transplantation, and autoimmune conditions.  Hepatitis C  Blood testing is recommended for: ? Everyone born from [birth year] through 1964. ? Anyone with known risk factors for  hepatitis C.  Sexually transmitted infections (STIs)  You should be screened for sexually transmitted infections (STIs) including gonorrhea and chlamydia if: ? You are sexually active and are younger than 50 years of age. ? You are older than 50 years of age and your health care provider tells you that you are at risk for this type of infection. ? Your sexual activity has changed since you were last screened and you are at an increased risk for chlamydia or gonorrhea. Ask your health care provider if you are at risk.  If you do not have HIV, but are at risk, it may be recommended that you take a prescription medicine daily to prevent HIV infection. This is called pre-exposure prophylaxis (PrEP). You are considered at risk if: ? You are sexually active and do not regularly use condoms or know the HIV status of your partner(s). ? You take drugs by injection. ? You are sexually active with a partner who has HIV.  Talk with your health care provider about whether you are at high risk of being infected with HIV. If you choose to begin PrEP, you should first be tested for HIV. You should then be tested every 3 months for as long as you are taking PrEP. Pregnancy  If you are premenopausal and you may become pregnant, ask your health care provider about preconception counseling.  If you may become pregnant, take 400 to 800 micrograms (mcg) of folic acid every day.  If you want to prevent pregnancy, talk to your health care provider about birth control (contraception). Osteoporosis and menopause  Osteoporosis is a disease in which the bones lose minerals and strength with aging. This can result in serious bone fractures. Your risk for osteoporosis can be identified using a bone density scan.  If you are 24 years of age or older, or if you are at risk for osteoporosis and fractures, ask your health care provider if you should be screened.  Ask your health care provider whether you should take a  calcium or vitamin D supplement to lower your risk for osteoporosis.  Menopause may have certain physical symptoms and risks.  Hormone replacement therapy may reduce some of these symptoms and risks. Talk to your health care provider about whether hormone replacement therapy is right for you. Follow these instructions at home:  Schedule regular health, dental, and eye exams.  Stay current with your immunizations.  Do not use any tobacco products including cigarettes, chewing tobacco, or electronic cigarettes.  If you are pregnant, do not drink alcohol.  If you are breastfeeding, limit how much and how often you drink alcohol.  Limit alcohol intake to no more than 1 drink per day for nonpregnant women. One drink equals 12 ounces of beer, 5 ounces of wine, or 1 ounces of hard liquor.  Do not use street drugs.  Do not share needles.  Ask your health care provider for help if you need support or information about quitting drugs.  Tell your health care provider if you often feel depressed.  Tell your health care provider if you have ever been abused or do not feel safe at home. This information is not intended to replace advice given to you by your health care provider. Make sure you discuss any questions you have with your health care provider. Document Released: 12/25/2010 Document Revised: 11/17/2015 Document Reviewed: 03/15/2015 Elsevier Interactive Patient Education  [employer].

## 2017-06-10 NOTE — Progress Notes (Signed)
Subjective:    Patient ID: Erin [name], female    DOB: 04-16-1966, 51 y.o.   MRN: [id number]  HPI She is here for a physical exam.   She has chronic lower back pain and the robaxin has helped in the past.  She would like to know what is causing the pain.   She has intermittent finger joint pain.  She often wakes up in the middle of the night gasping for air.  She does snore.   We did refer her to pulmonary, but she did not follow through.   Medications and allergies reviewed with patient and updated if appropriate.  Patient Active Problem List   Diagnosis Date Noted  . Chest tightness 11/02/2016  . Grief at loss of child 11/02/2016  . H/O gestational diabetes mellitus, not currently pregnant 09/28/2015  . Elevated blood pressure reading 09/28/2015  . Prediabetes 09/28/2015  . Morbid obesity ([place]) 05/17/2015  . Umbilical hernia 13/24/4010  . Chronic lower back pain 05/17/2015  . Snoring 05/17/2015  . Palpitations 05/17/2015  . ANEMIA 09/03/2007  . Hypothyroidism following radioiodine therapy 09/02/2007  . INTERSTITIAL CYSTITIS 09/01/2007  . ALLERGIC RHINITIS 08/07/2007  . [place] DISEASE, LUMBAR 08/07/2007    Current Outpatient Medications on File Prior to Visit  Medication Sig Dispense Refill  . cholecalciferol (VITAMIN D) 1000 units tablet Take 1,000 Units by mouth daily.    . Fe Fum-FePoly-Vit C-Vit B3 (INTEGRA) 62.5-62.5-40-3 MG CAPS Take 1 capsule by mouth daily. 30 capsule 5  . ibuprofen (ADVIL,MOTRIN) 200 MG tablet Take 600 mg by mouth every 6 (six) hours as needed for pain.    [name] levonorgestrel (MIRENA, 52 MG,) 20 MCG/24HR IUD Mirena 20 mcg/24 hr (5 years) intrauterine device  Take 1 device by intrauterine route.    [name] levothyroxine (SYNTHROID, LEVOTHROID) 125 MCG tablet TAKE 1 TABLET BY MOUTH EVERY DAY 30 tablet 2  . losartan (COZAAR) 50 MG tablet Take 1 tablet (50 mg total) by mouth daily. 90 tablet 3  . Soft Lens Products (SALINE) SOLN 2 drops by Does not apply  route daily as needed (for eye problem).     No current facility-administered medications on file prior to visit.     Past Medical History:  Diagnosis Date  . Elevated blood-pressure reading without diagnosis of hypertension   . Endometriosis   . Fibroids   . Gestational diabetes   . Graves disease   . Grief at loss of child   . Hypothyroidism   . Menorrhagia   . Premature ventricular contractions    a. rare PVC by monitor 12/2016.  [name] Thyroid disease   . Uterine leiomyoma     Past Surgical History:  Procedure Laterality Date  . APPENDECTOMY    . CESAREAN SECTION    . CHOLECYSTECTOMY    . HERNIA REPAIR    . TUMOR REMOVAL  fibroids    Social History   Socioeconomic History  . Marital status: Married    Spouse name: None  . Number of children: None  . Years of education: None  . Highest education level: None  Social Needs  . Financial resource strain: None  . Food insecurity - worry: None  . Food insecurity - inability: None  . Transportation needs - medical: None  . Transportation needs - non-medical: None  Occupational History  . None  Tobacco Use  . Smoking status: Never Smoker  . Smokeless tobacco: Never Used  Substance and Sexual Activity  . Alcohol use: No  .  Drug use: No  . Sexual activity: None  Other Topics Concern  . None  Social History Narrative  . None    Family History  Problem Relation Age of Onset  . Hypertension Mother   . Cancer Mother   . Hypertension Father   . Diabetes Father   . Cancer Father     Review of Systems  Constitutional: Negative for chills and fever.  Eyes: Negative for visual disturbance.  Respiratory: Negative for cough, shortness of breath and wheezing.   Cardiovascular: Negative for chest pain, palpitations and leg swelling.  Gastrointestinal: Negative for abdominal pain, blood in stool, constipation, diarrhea and nausea.  Genitourinary: Negative for dysuria and hematuria.  Musculoskeletal: Positive for  arthralgias (hands, knees, occ hip) and back pain.  Skin: Negative for color change and rash.  Neurological: Positive for headaches. Negative for light-headedness.  Psychiatric/Behavioral: Negative for dysphoric mood. The patient is not nervous/anxious.        Only regarding death of child and mother       Objective:   Vitals:   06/11/17 1448  BP: 120/82  Pulse: 86  Resp: 16  Temp: 98.7 F (37.1 C)  SpO2: 98%   Filed Weights   06/11/17 1448  Weight: (!) 343 lb (155.6 kg)   Body mass index is 57.08 kg/m.  Wt Readings from Last 3 Encounters:  06/11/17 (!) 343 lb (155.6 kg)  12/05/16 (!) 339 lb 1.9 oz (153.8 kg)  11/02/16 (!) 337 lb (152.9 kg)     Physical Exam Constitutional: She appears well-developed and well-nourished. No distress.  HENT:  Head: Normocephalic and atraumatic.  Right Ear: External ear normal. Normal ear canal and TM Left Ear: External ear normal.  Normal ear canal and TM Mouth/Throat: Oropharynx is clear and moist.  Eyes: Conjunctivae and EOM are normal.  Neck: Neck supple. No tracheal deviation present. No thyromegaly present.  No carotid bruit  Cardiovascular: Normal rate, regular rhythm and normal heart sounds.   No murmur heard.  No edema. Pulmonary/Chest: Effort normal and breath sounds normal. No respiratory distress. She has no wheezes. She has no rales.  Breast: deferred to Gyn Abdominal: Soft. She exhibits no distension. There is no tenderness.  Lymphadenopathy: She has no cervical adenopathy.  Skin: Skin is warm and dry. She is not diaphoretic.  Psychiatric: She has a normal mood and affect. Her behavior is normal.        Assessment & Plan:   Physical exam: Screening blood work  ordered Immunizations  tdap next visit, flu vaccine deferred, discussed shingrix Colonoscopy     Never had one -- referred today Mammogram  Up to date  Gyn  Up to date  Eye exams  Not up to date  - will schedule EKG  Last done 11/2016 Exercise  -  starting to exercise - doing yoga - increasing it slowly Weight   Morbidly obese - advised weight loss Skin   no concerns Substance abuse   none  See Problem List for Assessment and Plan of chronic medical problems.

## 2017-06-11 ENCOUNTER — Encounter: Payer: Self-pay | Admitting: Internal Medicine

## 2017-06-11 ENCOUNTER — Ambulatory Visit (INDEPENDENT_AMBULATORY_CARE_PROVIDER_SITE_OTHER): Payer: BLUE CROSS/BLUE SHIELD | Admitting: Internal Medicine

## 2017-06-11 ENCOUNTER — Encounter: Payer: Self-pay | Admitting: Gastroenterology

## 2017-06-11 ENCOUNTER — Other Ambulatory Visit (INDEPENDENT_AMBULATORY_CARE_PROVIDER_SITE_OTHER): Payer: BLUE CROSS/BLUE SHIELD

## 2017-06-11 VITALS — BP 120/82 | HR 86 | Temp 98.7°F | Resp 16 | Ht 65.0 in | Wt 343.0 lb

## 2017-06-11 DIAGNOSIS — M545 Low back pain, unspecified: Secondary | ICD-10-CM

## 2017-06-11 DIAGNOSIS — G8929 Other chronic pain: Secondary | ICD-10-CM | POA: Diagnosis not present

## 2017-06-11 DIAGNOSIS — N924 Excessive bleeding in the premenopausal period: Secondary | ICD-10-CM

## 2017-06-11 DIAGNOSIS — Z0000 Encounter for general adult medical examination without abnormal findings: Secondary | ICD-10-CM

## 2017-06-11 DIAGNOSIS — E890 Postprocedural hypothyroidism: Secondary | ICD-10-CM | POA: Diagnosis not present

## 2017-06-11 DIAGNOSIS — R0683 Snoring: Secondary | ICD-10-CM | POA: Diagnosis not present

## 2017-06-11 DIAGNOSIS — Z8632 Personal history of gestational diabetes: Secondary | ICD-10-CM

## 2017-06-11 DIAGNOSIS — I10 Essential (primary) hypertension: Secondary | ICD-10-CM

## 2017-06-11 DIAGNOSIS — R7303 Prediabetes: Secondary | ICD-10-CM

## 2017-06-11 DIAGNOSIS — Z1211 Encounter for screening for malignant neoplasm of colon: Secondary | ICD-10-CM | POA: Diagnosis not present

## 2017-06-11 LAB — COMPREHENSIVE METABOLIC PANEL
ALT: 11 U/L (ref 0–35)
AST: 13 U/L (ref 0–37)
Albumin: 4 g/dL (ref 3.5–5.2)
Alkaline Phosphatase: 101 U/L (ref 39–117)
BUN: 15 mg/dL (ref 6–23)
CO2: 30 mEq/L (ref 19–32)
Calcium: 9 mg/dL (ref 8.4–10.5)
Chloride: 103 mEq/L (ref 96–112)
Creatinine, Ser: 0.87 mg/dL (ref 0.40–1.20)
GFR: 88.32 mL/min (ref >60.00)
Glucose, Bld: 92 mg/dL (ref 70–99)
Potassium: 3.5 mEq/L (ref 3.5–5.1)
Sodium: 139 mEq/L (ref 135–145)
Total Bilirubin: 0.6 mg/dL (ref 0.2–1.2)
Total Protein: 7.5 g/dL (ref 6.0–8.3)

## 2017-06-11 LAB — LIPID PANEL
Cholesterol: 168 mg/dL (ref 0–200)
HDL: 46.5 mg/dL (ref >39.00)
LDL Cholesterol: 100 mg/dL — ABNORMAL HIGH (ref 0–99)
NonHDL: 121.96
Total CHOL/HDL Ratio: 4
Triglycerides: 110 mg/dL (ref 0.0–149.0)
VLDL: 22 mg/dL (ref 0.0–40.0)

## 2017-06-11 LAB — CBC WITH DIFFERENTIAL/PLATELET
Basophils Absolute: 0.1 10*3/uL (ref 0.0–0.1)
Basophils Relative: 0.4 % (ref 0.0–3.0)
Eosinophils Absolute: 0.1 10*3/uL (ref 0.0–0.7)
Eosinophils Relative: 1 % (ref 0.0–5.0)
HCT: 35.8 % — ABNORMAL LOW (ref 36.0–46.0)
Hemoglobin: 11.4 g/dL — ABNORMAL LOW (ref 12.0–15.0)
Lymphocytes Relative: 38 % (ref 12.0–46.0)
Lymphs Abs: 4.4 10*3/uL — ABNORMAL HIGH (ref 0.7–4.0)
MCHC: 31.7 g/dL (ref 30.0–36.0)
MCV: 81.4 fl (ref 78.0–100.0)
Monocytes Absolute: 0.6 10*3/uL (ref 0.1–1.0)
Monocytes Relative: 5.4 % (ref 3.0–12.0)
Neutro Abs: 6.4 10*3/uL (ref 1.4–7.7)
Neutrophils Relative %: 55.2 % (ref 43.0–77.0)
Platelets: 326 10*3/uL (ref 150.0–400.0)
RBC: 4.4 Mil/uL (ref 3.87–5.11)
RDW: 15.2 % (ref 11.5–15.5)
WBC: 11.6 10*3/uL — ABNORMAL HIGH (ref 4.0–10.5)

## 2017-06-11 LAB — HEMOGLOBIN A1C: Hgb A1c MFr Bld: 6.3 % (ref 4.6–6.5)

## 2017-06-11 LAB — IRON: Iron: 47 ug/dL (ref 42–145)

## 2017-06-11 LAB — FERRITIN: Ferritin: 15 ng/mL (ref 10.0–291.0)

## 2017-06-11 MED ORDER — METAXALONE 800 MG PO TABS: 800.0000 mg | ORAL_TABLET | Freq: Three times a day (TID) | ORAL | 2 refills | Status: DC

## 2017-06-11 NOTE — Assessment & Plan Note (Signed)
Check a1c Low sugar / carb diet Stressed regular exercise, weight loss  

## 2017-06-11 NOTE — Assessment & Plan Note (Signed)
management per Dr Dwyane Dee

## 2017-06-11 NOTE — Assessment & Plan Note (Signed)
Working on [employer] loss Working on increasing exercise Working on [employer] changes

## 2017-06-11 NOTE — Assessment & Plan Note (Signed)
Check a1c 

## 2017-06-11 NOTE — Assessment & Plan Note (Signed)
BP well controlled Current regimen effective and well tolerated Continue current medications at current doses cmp  

## 2017-06-11 NOTE — Assessment & Plan Note (Addendum)
Has intermittent pain Robaxin is effective - will continue Refilled today Referred to dr Tamala Julian

## 2017-06-12 ENCOUNTER — Telehealth: Payer: Self-pay | Admitting: Emergency Medicine

## 2017-06-12 NOTE — Telephone Encounter (Signed)
Copied from [place]. Topic: Inquiry >> Jun 12, 2017  9:42 AM Conception Chancy, NT wrote: Reason for CRM: patient wanted Marzetta Board to relay a message and let Dr. Quay Burow know that her mother died recently which she is aware of but she died suddenly of pulmonary embolism. She wants to know is there any test she needs to have done that Dr. Quay Burow may be concerned about considering her mother died suddenly.

## 2017-06-12 NOTE — Telephone Encounter (Signed)
No there is no test she should have.  A pulmonary embolism is a blood clot - there are several reasons why this could occur - it is not usually genetic.  We can discuss this more at her next appt.

## 2017-06-13 NOTE — Telephone Encounter (Signed)
LVM informing pt

## 2017-06-20 ENCOUNTER — Encounter: Payer: Self-pay | Admitting: Emergency Medicine

## 2017-06-26 ENCOUNTER — Other Ambulatory Visit: Payer: BLUE CROSS/BLUE SHIELD

## 2017-06-27 ENCOUNTER — Other Ambulatory Visit (INDEPENDENT_AMBULATORY_CARE_PROVIDER_SITE_OTHER): Payer: BLUE CROSS/BLUE SHIELD

## 2017-06-27 ENCOUNTER — Ambulatory Visit: Payer: BLUE CROSS/BLUE SHIELD | Admitting: Endocrinology

## 2017-06-27 DIAGNOSIS — E890 Postprocedural hypothyroidism: Secondary | ICD-10-CM | POA: Diagnosis not present

## 2017-06-27 LAB — TSH: TSH: 18.55 u[IU]/mL — ABNORMAL HIGH (ref 0.35–4.50)

## 2017-06-27 LAB — T4, FREE: Free T4: 0.67 ng/dL (ref 0.60–1.60)

## 2017-06-28 ENCOUNTER — Encounter: Payer: Self-pay | Admitting: Endocrinology

## 2017-06-28 ENCOUNTER — Ambulatory Visit (INDEPENDENT_AMBULATORY_CARE_PROVIDER_SITE_OTHER): Payer: BLUE CROSS/BLUE SHIELD | Admitting: Endocrinology

## 2017-06-28 VITALS — BP 123/82 | HR 95 | Ht 65.0 in | Wt 340.0 lb

## 2017-06-28 DIAGNOSIS — E890 Postprocedural hypothyroidism: Secondary | ICD-10-CM | POA: Diagnosis not present

## 2017-06-28 MED ORDER — UNITHROID 137 MCG PO TABS: 137.0000 ug | ORAL_TABLET | Freq: Every day | ORAL | 3 refills | Status: DC

## 2017-06-28 NOTE — Progress Notes (Signed)
Erin [name] 52 y.o.   Reason for Appointment:  Hypothyroidism, followup visit    History of Present Illness:   The hypothyroidism was first diagnosed  after treatment of her Graves' disease, had I-131 treatment in 08/2008  Her dose had been adjusted previously and she had been taking a lower dose of 50 g for some time with stable levels However in 6/15 her TSH was significantly high at 8.47 despite being compliant with her medication. With increasing her dose to 75 g she did not seem to have any significant change in her energy level  Although her TSH was normal in 01/2014 it was significantly higher in 2/16 Her doses have been adjusted fairly regularly since 2015  Subsequently with 112 g her TSH had been fluctuating somewhat On her visit in 9/17 her TSH was unusually high at 10.5, she was having nonspecific fatigue Subsequently did not follow-up as scheduled  Although she was switched to the 137 g since then she did not feel any different with the dosage change  In 5/18 her TSH was low at 0.16 and her dose was reduced back to 125 g She still complains of mild fatigue most of the time which is not new and a relatively chronic, partly related to stress Does not have any unusual cold intolerance No recent weight change  Her TSH is surprisingly high at 18.5 now She thinks she is taking her levothyroxine daily but for some reason has changed the dose to the afternoon with a snack instead of taking it before breakfast and she started taking losartan, she made this change on her own She also thinks that her pharmacy has given her different brand Generic now    Lab Results  Component Value Date   TSH 18.55 (H) 06/27/2017   TSH 0.16 (L) 10/29/2016   TSH 10.45 (H) 02/14/2016   FREET4 0.67 06/27/2017   FREET4 1.17 10/29/2016   FREET4 0.72 02/14/2016    Wt Readings from Last 3 Encounters:  06/28/17 (!) 340 lb (154.2 kg)  06/11/17 (!) 343 lb (155.6 kg)  12/05/16  (!) 339 lb 1.9 oz (153.8 kg)     Allergies as of 06/28/2017      Reactions   Prozac [fluoxetine Hcl] Nausea Only   Hydrocodone Hives, Itching, Rash   On thighs      Medication List        Accurate as of 06/28/17  2:12 PM. Always use your most recent med list.          cholecalciferol 1000 units tablet Commonly known as:  VITAMIN D Take 1,000 Units by mouth daily.   levothyroxine 125 MCG tablet Commonly known as:  SYNTHROID, LEVOTHROID TAKE 1 TABLET BY MOUTH EVERY DAY   losartan 50 MG tablet Commonly known as:  COZAAR Take 1 tablet (50 mg total) by mouth daily.   metaxalone 800 MG tablet Commonly known as:  SKELAXIN Take 1 tablet (800 mg total) by mouth 3 (three) times daily.   MIRENA (52 MG) 20 MCG/24HR IUD Generic drug:  levonorgestrel Mirena 20 mcg/24 hr (5 years) intrauterine device  Take 1 device by intrauterine route.   Saline Soln 2 drops by Does not apply route daily as needed (for eye problem).       Allergies:  Allergies  Allergen Reactions  . Prozac [Fluoxetine Hcl] Nausea Only  . Hydrocodone Hives, Itching and Rash    On thighs     Past Medical History:  Diagnosis Date  .  Elevated blood-pressure reading without diagnosis of hypertension   . Endometriosis   . Fibroids   . Gestational diabetes   . Graves disease   . Grief at loss of child   . Hypothyroidism   . Menorrhagia   . Premature ventricular contractions    a. rare PVC by monitor 12/2016.  [name] Thyroid disease   . Uterine leiomyoma     Past Surgical History:  Procedure Laterality Date  . APPENDECTOMY    . CESAREAN SECTION    . CHOLECYSTECTOMY    . HERNIA REPAIR    . TUMOR REMOVAL  fibroids    Family History  Problem Relation Age of Onset  . Hypertension Mother   . Cancer Mother   . Hypertension Father   . Diabetes Father   . Cancer Father     Social History:  reports that  has never smoked. she has never used smokeless tobacco. She reports that she does not drink alcohol  or use drugs.  REVIEW Of SYSTEMS:   Hypertension treated by PCP with losartan 50 mg with good control  BP Readings from Last 3 Encounters:  06/28/17 123/82  06/11/17 120/82  12/05/16 126/74      Examination:   BP 123/82   Pulse 95   Ht 5\' 5"  (1.651 m)   Wt (!) 340 lb (154.2 kg)   SpO2 97%   BMI 56.58 kg/m   She looks well Thyroid not palpable Biceps reflexes are normal Skin normal No swelling of hands or eyes    Assessment   HYPOTHYROIDISM, Post ablative  She has had inconsistent control of her hypothyroidism and no normal TSH on the last 3 measurements Not clear why her dosage requirement is fluctuating Also she is getting a different brand Generic now She is now significantly hypothyroid with TSH 18.5 This may be partly related to her taking her levothyroxine in the afternoon with a snack instead of before breakfast which she was doing previously for some time  Explained to the patient that the levothyroxine needs to be taken before breakfast, on empty stomach at least 30 minutes before eating For convenience he can take this with her losartan Since she is probably getting inconsistent generics will switch her to UNITHROID 137 g now and most likely with her high TSH will need higher dose instead of 125   Treatment:  As above  Unithroid prescription sent to the pharmacy  Needs reevaluation in 2 months  Mateo Overbeck 06/28/2017, 2:12 PM

## 2017-06-28 NOTE — Patient Instructions (Signed)
Take thyroid in am

## 2017-07-03 ENCOUNTER — Telehealth: Payer: Self-pay | Admitting: Internal Medicine

## 2017-07-03 NOTE — Telephone Encounter (Signed)
Copied from [place] [id number]. Topic: Quick Communication - See Telephone Encounter >> Jul 03, 2017 12:11 PM Oneta Rack wrote: CRM for notification. See Telephone encounter for:   07/03/17.'  Relation to LS:LHTD Call back number: Pharmacy:  Reason for call:  Patient declined appointment unless PCP recommends, patient seeking clinical advice regarding right heel spur, patient experienincing pain, unable to hardly work, patient currently at work until 3:30pm please advise best # work [phone] (W).

## 2017-07-03 NOTE — Telephone Encounter (Signed)
Should be seen -- can set up with Dr Raeford Razor.

## 2017-07-04 NOTE — Telephone Encounter (Signed)
Spoke with pt, scheduled with Dr Raeford Razor tomorrow at [place].

## 2017-07-04 NOTE — Telephone Encounter (Signed)
Tried contacting pt, no answer on Work or home number

## 2017-07-05 ENCOUNTER — Ambulatory Visit: Payer: BLUE CROSS/BLUE SHIELD | Admitting: Family Medicine

## 2017-07-09 ENCOUNTER — Encounter: Payer: Self-pay | Admitting: Internal Medicine

## 2017-07-15 ENCOUNTER — Encounter: Payer: Self-pay | Admitting: Family Medicine

## 2017-07-15 ENCOUNTER — Ambulatory Visit: Payer: BLUE CROSS/BLUE SHIELD | Admitting: Family Medicine

## 2017-07-15 VITALS — BP 136/84 | HR 72 | Temp 98.7°F | Ht 65.0 in | Wt 338.0 lb

## 2017-07-15 DIAGNOSIS — G8929 Other chronic pain: Secondary | ICD-10-CM | POA: Diagnosis not present

## 2017-07-15 DIAGNOSIS — M79671 Pain in right foot: Secondary | ICD-10-CM | POA: Diagnosis not present

## 2017-07-15 DIAGNOSIS — M545 Low back pain, unspecified: Secondary | ICD-10-CM

## 2017-07-15 MED ORDER — DICLOFENAC SODIUM 2 % TD SOLN: 1.0000 "application " | Freq: Two times a day (BID) | TRANSDERMAL | 3 refills | Status: DC

## 2017-07-15 NOTE — Patient Instructions (Addendum)
Please try the exercises for your foot before you get out of bed in the morning.  Please try the exercises for your back at least once daily.  Please keep up the good work and continue to lose weight.  Please let me know if your symptoms don't improve and we may need to try physical therapy.

## 2017-07-15 NOTE — Assessment & Plan Note (Signed)
Pain suggestive of Plantar fasciitis. Limited ROM with dorsiflexion is likely the culprit  - counseled on HEP  - Pennsaid  - if no improvement consider injection vs PT.

## 2017-07-15 NOTE — Assessment & Plan Note (Signed)
Pain likely MSK in nature. No symptoms suggestive of nerve impingement.  - counseled on HEP  - counseled on weight loss  - continue muscle relaxer  - if no improvement then consider PT

## 2017-07-15 NOTE — Progress Notes (Signed)
Erin [name] - 52 y.o. female MRN [id number]  Date of birth: [birth year]  SUBJECTIVE:  Including CC & ROS.  Chief Complaint  Patient presents with  . Heel Spurs    Erin [name] is a 52 y.o. female that is presenting with right foot pain and low back pain.  Her heels started bothering her three weeks ago. She woke up one morning and could not put her weight on her heels. Denies injury.  Her right is worse than left. She has not tried anything for it. Her pain is localized. No swelling or bruising. Denies any prior injury or surgery. She feels the pain is severe in nature. Pain is staying the same.   Her back pain is located near her tailbone, this has been a chronic pain. She states the pain is constant achy pain.Denies certain movements trigger the pain.  She has been taking Metaxalone with some improvement to her pain. Denies injury or surgery. No radicular symptoms. Pain is worse at the end of the day and when she has prolonged standing. She feels the pain is staying the same.    Review of Systems  Constitutional: Negative for fever.  Respiratory: Negative for cough.   Cardiovascular: Negative for chest pain.  Gastrointestinal: Negative for abdominal pain.  Musculoskeletal: Positive for back pain. Negative for gait problem.  Skin: Negative for color change.  Neurological: Negative for weakness.  Hematological: Negative for adenopathy.  Psychiatric/Behavioral: Negative for agitation.    HISTORY: Past Medical, Surgical, Social, and Family History Reviewed & Updated per EMR.   Pertinent Historical Findings include:  Past Medical History:  Diagnosis Date  . Elevated blood-pressure reading without diagnosis of hypertension   . Endometriosis   . Fibroids   . Gestational diabetes   . Graves disease   . Grief at loss of child   . Hypothyroidism   . Menorrhagia   . Premature ventricular contractions    a. rare PVC by monitor 12/2016.  [name] Thyroid disease   . Uterine leiomyoma      Past Surgical History:  Procedure Laterality Date  . APPENDECTOMY    . CESAREAN SECTION    . CHOLECYSTECTOMY    . HERNIA REPAIR    . TUMOR REMOVAL  fibroids    Allergies  Allergen Reactions  . Prozac [Fluoxetine Hcl] Nausea Only  . Hydrocodone Hives, Itching and Rash    On thighs     Family History  Problem Relation Age of Onset  . Hypertension Mother   . Cancer Mother   . Hypertension Father   . Diabetes Father   . Cancer Father      Social History   Socioeconomic History  . Marital status: Married    Spouse name: Not on file  . Number of children: Not on file  . Years of education: Not on file  . Highest education level: Not on file  Social Needs  . Financial resource strain: Not on file  . Food insecurity - worry: Not on file  . Food insecurity - inability: Not on file  . Transportation needs - medical: Not on file  . Transportation needs - non-medical: Not on file  Occupational History  . Not on file  Tobacco Use  . Smoking status: Never Smoker  . Smokeless tobacco: Never Used  Substance and Sexual Activity  . Alcohol use: No  . Drug use: No  . Sexual activity: Not on file  Other Topics Concern  . Not on file  Social History  Narrative  . Not on file     PHYSICAL EXAM:  VS: BP 136/84 (BP Location: Left Arm, Patient Position: Sitting, Cuff Size: Normal)   Pulse 72   Temp 98.7 F (37.1 C) (Oral)   Ht 5\' 5"  (1.651 m)   Wt (!) 338 lb (153.3 kg)   SpO2 98%   BMI 56.25 kg/m  Physical Exam Gen: NAD, alert, cooperative with exam, well-appearing ENT: normal lips, normal nasal mucosa,  Eye: normal EOM, normal conjunctiva and lids CV:  no edema, +2 pedal pulses   Resp: no accessory muscle use, non-labored,  Skin: no rashes, no areas of induration  Neuro: normal tone, normal sensation to touch Psych:  normal insight, alert and oriented MSK:  Right foot:  TTP of the plantar aspect of the right heel  Limited ROM with dorsiflexion b/l  No TTP  of the achilles or achilles insertion  No abnormal callus formation  Back:  TTP of the midline lumbar spine  No TTP of the paraspinal muscles  No TTP of the GT  Normal IR and ER  Normal strength to resistance with hip flexion  Normal knee flexion and extension  Normal gait.  Negative SLR b/l  Neurovascularly intact   Limited ultrasound: right foot:  Swelling of the PF   Summary: findings suggestive of Plantar fasciitis.   Ultrasound and interpretation by Clearance Coots, MD      ASSESSMENT & PLAN:   Chronic lower back pain Pain likely MSK in nature. No symptoms suggestive of nerve impingement.  - counseled on HEP  - counseled on weight loss  - continue muscle relaxer  - if no improvement then consider PT   Right foot pain Pain suggestive of Plantar fasciitis. Limited ROM with dorsiflexion is likely the culprit  - counseled on HEP  - Pennsaid  - if no improvement consider injection vs PT.

## 2017-08-05 ENCOUNTER — Encounter: Payer: Self-pay | Admitting: Internal Medicine

## 2017-08-05 ENCOUNTER — Encounter: Payer: BLUE CROSS/BLUE SHIELD | Admitting: Gastroenterology

## 2017-08-16 ENCOUNTER — Telehealth: Payer: Self-pay | Admitting: Internal Medicine

## 2017-08-16 MED ORDER — METHOCARBAMOL 500 MG PO TABS: 500.0000 mg | ORAL_TABLET | Freq: Four times a day (QID) | ORAL | 3 refills | Status: DC | PRN

## 2017-08-16 NOTE — Telephone Encounter (Signed)
I sent a different prescription to her pharmacy.  I cannot tell how expensive the prescription is so it is not cheaper she should call her insurance company and find out specifically what medications are cheaper so that we can try 1 of those.

## 2017-08-16 NOTE — Telephone Encounter (Signed)
LVM informing pt

## 2017-08-16 NOTE — Telephone Encounter (Signed)
Copied from [place] [id number]. Topic: Quick Communication - See Telephone Encounter >> Aug 16, 2017  8:39 AM Robina Ade, Helene Kelp D wrote: CRM for notification. See Telephone encounter for:   08/16/17. Patient called to see if there is a cheaper muscle relaxer she can get because her husbands insurance has changed she the cost is too high for her. Please call patient back.

## 2017-08-27 ENCOUNTER — Other Ambulatory Visit (INDEPENDENT_AMBULATORY_CARE_PROVIDER_SITE_OTHER): Payer: BLUE CROSS/BLUE SHIELD

## 2017-08-27 DIAGNOSIS — E890 Postprocedural hypothyroidism: Secondary | ICD-10-CM

## 2017-08-27 LAB — TSH: TSH: 0.09 u[IU]/mL — ABNORMAL LOW (ref 0.35–4.50)

## 2017-08-27 LAB — T4, FREE: Free T4: 1.11 ng/dL (ref 0.60–1.60)

## 2017-08-29 ENCOUNTER — Ambulatory Visit (INDEPENDENT_AMBULATORY_CARE_PROVIDER_SITE_OTHER): Payer: BLUE CROSS/BLUE SHIELD | Admitting: Endocrinology

## 2017-08-29 ENCOUNTER — Encounter: Payer: Self-pay | Admitting: Endocrinology

## 2017-08-29 VITALS — BP 130/78 | HR 98 | Wt 340.0 lb

## 2017-08-29 DIAGNOSIS — E890 Postprocedural hypothyroidism: Secondary | ICD-10-CM | POA: Diagnosis not present

## 2017-08-29 MED ORDER — UNITHROID 125 MCG PO TABS: 125.0000 ug | ORAL_TABLET | Freq: Every day | ORAL | 3 refills | Status: DC

## 2017-08-29 NOTE — Progress Notes (Signed)
Erin [name] 52 y.o.   Reason for Appointment:  Hypothyroidism, followup visit    History of Present Illness:   The hypothyroidism was first diagnosed  after treatment of her Graves' disease, had I-131 treatment in 08/2008  Her dose had been adjusted previously and she had been taking a lower dose of 50 g for some time with stable levels However in 6/15 her TSH was significantly high at 8.47 despite being compliant with her medication. With increasing her dose to 75 g she did not seem to have any significant change in her energy level  Although her TSH was normal in 01/2014 it was significantly higher in 2/16 Her doses have been adjusted fairly regularly since 2015  Subsequently with 112 g her TSH had been fluctuating somewhat On her visit in 9/17 her TSH was unusually high at 10.5, she was having nonspecific fatigue Subsequently did not follow-up as scheduled  Although she was switched to the 137 g since then she did not feel any different with the dosage change Subsequently 1/19 TSH was unusually high at 18.5  She was not having any unusual fatigue at that time or weight change However it appears that at that time she was taking her  levothyroxine dose in the afternoon with a snack instead of taking it before breakfast, she was not wanting to combine it with losartan that was new  Her dose was changed back to 137 mcg in 1/19 and she was also asked to take her levothyroxine before breakfast She has also been switched to UNITHROID brand name which is not excessively expensive for her She has been regular with taking her medication  Now she is not as tired She does think that her fatigue has improved, on the other hand she does not complain of any shakiness or palpitations  Now her TSH is back to below normal levels with this change with only mid normal levels of free T4   Lab Results  Component Value Date   TSH 0.09 (L) 08/27/2017   TSH 18.55 (H) 06/27/2017   TSH 0.16 (L) 10/29/2016   FREET4 1.11 08/27/2017   FREET4 0.67 06/27/2017   FREET4 1.17 10/29/2016    Wt Readings from Last 3 Encounters:  08/29/17 (!) 340 lb (154.2 kg)  07/15/17 (!) 338 lb (153.3 kg)  06/28/17 (!) 340 lb (154.2 kg)     Allergies as of 08/29/2017      Reactions   Prozac [fluoxetine Hcl] Nausea Only   Hydrocodone Hives, Itching, Rash   On thighs      Medication List        Accurate as of 08/29/17  3:23 PM. Always use your most recent med list.          cholecalciferol 1000 units tablet Commonly known as:  VITAMIN D Take 1,000 Units by mouth daily.   Diclofenac Sodium 2 % Soln Commonly known as:  PENNSAID Place 1 application onto the skin 2 (two) times daily.   losartan 50 MG tablet Commonly known as:  COZAAR Take 1 tablet (50 mg total) by mouth daily.   methocarbamol 500 MG tablet Commonly known as:  ROBAXIN Take 1 tablet (500 mg total) by mouth every 6 (six) hours as needed for muscle spasms.   MIRENA (52 MG) 20 MCG/24HR IUD Generic drug:  levonorgestrel Mirena 20 mcg/24 hr (5 years) intrauterine device  Take 1 device by intrauterine route.   Saline Soln 2 drops by Does not apply route daily as needed (  for eye problem).   UNITHROID 137 MCG tablet Generic drug:  levothyroxine Take 1 tablet (137 mcg total) by mouth daily before breakfast.       Allergies:  Allergies  Allergen Reactions  . Prozac [Fluoxetine Hcl] Nausea Only  . Hydrocodone Hives, Itching and Rash    On thighs     Past Medical History:  Diagnosis Date  . Elevated blood-pressure reading without diagnosis of hypertension   . Endometriosis   . Fibroids   . Gestational diabetes   . Graves disease   . Grief at loss of child   . Hypothyroidism   . Menorrhagia   . Premature ventricular contractions    a. rare PVC by monitor 12/2016.  [name] Thyroid disease   . Uterine leiomyoma     Past Surgical History:  Procedure Laterality Date  . APPENDECTOMY    . CESAREAN  SECTION    . CHOLECYSTECTOMY    . HERNIA REPAIR    . TUMOR REMOVAL  fibroids    Family History  Problem Relation Age of Onset  . Hypertension Mother   . Cancer Mother   . Hypertension Father   . Diabetes Father   . Cancer Father     Social History:  reports that  has never smoked. she has never used smokeless tobacco. She reports that she does not drink alcohol or use drugs.  REVIEW Of SYSTEMS:   Hypertension treated by PCP with losartan 50 mg by her PCP with good control  BP Readings from Last 3 Encounters:  08/29/17 130/78  07/15/17 136/84  06/28/17 123/82      Examination:   BP 130/78 (BP Location: Right Arm, Patient Position: Sitting, Cuff Size: Large)   Pulse 98   Wt (!) 340 lb (154.2 kg)   SpO2 97%   BMI 56.58 kg/m   She looks well Biceps reflexes are normal Skin normal No tremor    Assessment   HYPOTHYROIDISM, Post ablative  She has had inconsistent control of her hypothyroidism and no normal TSH on the last 4 measurements  Recently her fluctuation has been related to her taking Her levothyroxine nonfasting and in the afternoon and possibly with continuing to use generic formulations With minimal change in her dosage her TSH is not suppressed without overt hyperthyroidism  Hypertension: Appears well controlled on losartan   Treatment:  She probably needs to get back to the 125 dose but to continue the Unithroid brand She can finish off her current regimen by taking 6-1/2 tablets a week Emphasized the need to take the same brand and take it before breakfast without any iron or calcium-containing vitamins  Patient Instructions  Take 1/2 tab on Fridays    Elayne Snare 08/29/2017, 3:23 PM

## 2017-08-29 NOTE — Patient Instructions (Signed)
Take 1/2 tab on Fridays

## 2017-09-26 ENCOUNTER — Emergency Department (HOSPITAL_COMMUNITY)
Admission: EM | Admit: 2017-09-26 | Discharge: 2017-09-27 | Disposition: A | Discharge status: Home / Self Care | Payer: BLUE CROSS/BLUE SHIELD | Attending: Emergency Medicine | Admitting: Emergency Medicine

## 2017-09-26 ENCOUNTER — Ambulatory Visit: Payer: Self-pay | Admitting: *Deleted

## 2017-09-26 ENCOUNTER — Other Ambulatory Visit: Payer: Self-pay

## 2017-09-26 ENCOUNTER — Encounter (HOSPITAL_COMMUNITY): Payer: Self-pay

## 2017-09-26 DIAGNOSIS — Z79899 Other long term (current) drug therapy: Secondary | ICD-10-CM | POA: Diagnosis not present

## 2017-09-26 DIAGNOSIS — R220 Localized swelling, mass and lump, head: Secondary | ICD-10-CM | POA: Diagnosis not present

## 2017-09-26 DIAGNOSIS — I10 Essential (primary) hypertension: Secondary | ICD-10-CM | POA: Insufficient documentation

## 2017-09-26 DIAGNOSIS — E039 Hypothyroidism, unspecified: Secondary | ICD-10-CM | POA: Diagnosis not present

## 2017-09-26 DIAGNOSIS — R519 Headache, unspecified: Secondary | ICD-10-CM

## 2017-09-26 DIAGNOSIS — R51 Headache: Secondary | ICD-10-CM | POA: Insufficient documentation

## 2017-09-26 LAB — BASIC METABOLIC PANEL
Anion gap: 10 (ref 5–15)
BUN: 14 mg/dL (ref 6–20)
CO2: 25 mmol/L (ref 22–32)
Calcium: 9.4 mg/dL (ref 8.9–10.3)
Chloride: 106 mmol/L (ref 101–111)
Creatinine, Ser: 0.76 mg/dL (ref 0.44–1.00)
GFR calc Af Amer: >60 mL/min (ref >60)
GFR calc non Af Amer: >60 mL/min (ref >60)
Glucose, Bld: 87 mg/dL (ref 65–99)
Potassium: 3.8 mmol/L (ref 3.5–5.1)
Sodium: 141 mmol/L (ref 135–145)

## 2017-09-26 LAB — SEDIMENTATION RATE: Sed Rate: 58 mm/hr — ABNORMAL HIGH (ref 0–22)

## 2017-09-26 LAB — C-REACTIVE PROTEIN: CRP: <0.8 mg/dL (ref <1.0)

## 2017-09-26 LAB — CBC WITH DIFFERENTIAL/PLATELET
Basophils Absolute: 0 10*3/uL (ref 0.0–0.1)
Basophils Relative: 0 %
Eosinophils Absolute: 0.1 10*3/uL (ref 0.0–0.7)
Eosinophils Relative: 1 %
HCT: 34.5 % — ABNORMAL LOW (ref 36.0–46.0)
Hemoglobin: 10.7 g/dL — ABNORMAL LOW (ref 12.0–15.0)
Lymphocytes Relative: 44 %
Lymphs Abs: 5.1 10*3/uL — ABNORMAL HIGH (ref 0.7–4.0)
MCH: 25.5 pg — ABNORMAL LOW (ref 26.0–34.0)
MCHC: 31 g/dL (ref 30.0–36.0)
MCV: 82.1 fL (ref 78.0–100.0)
Monocytes Absolute: 0.6 10*3/uL (ref 0.1–1.0)
Monocytes Relative: 6 %
Neutro Abs: 5.8 10*3/uL (ref 1.7–7.7)
Neutrophils Relative %: 49 %
Platelets: 378 10*3/uL (ref 150–400)
RBC: 4.2 MIL/uL (ref 3.87–5.11)
RDW: 13.8 % (ref 11.5–15.5)
WBC: 11.6 10*3/uL — ABNORMAL HIGH (ref 4.0–10.5)

## 2017-09-26 LAB — I-STAT BETA HCG BLOOD, ED (MC, WL, AP ONLY): I-stat hCG, quantitative: <5 m[IU]/mL (ref <5)

## 2017-09-26 NOTE — ED Triage Notes (Signed)
Pt presents with 3 day h/o L temporal pain.  Pt reports area is tender to touch, reports slight swelling to L side of her face.  Pt taking ibuprofen that will subside pain.

## 2017-09-26 NOTE — ED Provider Notes (Signed)
Patient placed in Quick Look pathway, seen and evaluated   Chief Complaint: Left temporal sided headache for three days  HPI:   Pain over left temple for the past three days.  No visual changes but feels "off." No fevers.  No neck or ear pain  ROS: No feevers  Physical Exam:   Gen: No distress  Neuro: Awake and Alert  Skin: Warm    Focused Exam: No objective facial swelling.  TTP and mild swelling over left temple in palpation.  No LAD.  Unable to view TMs bilaterally secondary to wax.    Initiation of care has begun. The patient has been counseled on the process, plan, and necessity for staying for the completion/evaluation, and the remainder of the medical screening examination    Erin [name] 09/26/17 Talbot Grumbling, MD 09/28/17 1539

## 2017-09-26 NOTE — Telephone Encounter (Signed)
Patient is calling- she states she has had a headache for 3 days- she has been using OTC pain medication with little to no pain relief. She awoke today and reports she has swelling on the left side of her face. She also states she feels a bump at her eye brow. Patient reports no other symptoms at this time- but headache. Patient advised to go to UC/ED for evaluation of swelling and treatment of headache. Reason for Disposition . Patient sounds very sick or weak to the triager  Answer Assessment - Initial Assessment Questions 1. ONSET: "When did the swelling start?" (e.g., minutes, hours, days)     Yesterday 2. LOCATION: "What part of the face is swollen?"     Left side of face- all the way down to ear 3. SEVERITY: "How swollen is it?"     Not noticeable to look- distorted a little bit 4. ITCHING: "Is there any itching?" If so, ask: "How much?"   (Scale 1-10; mild, moderate or severe)     no 5. PAIN: "Is the swelling painful to touch?" If so, ask: "How painful is it?"   (Scale 1-10; mild, moderate or severe)     Sharp pain- headache for 3 days- steady headache for 3 days- 8 6. FEVER: "Do you have a fever?" If so, ask: "What is it, how was it measured, and when did it start?"      no 7. CAUSE: "What do you think is causing the face swelling?"     Unknown- headache 8. RECURRENT SYMPTOM: "Have you had face swelling before?" If so, ask: "When was the last time?" "What happened that time?"     no 9. OTHER SYMPTOMS: "Do you have any other symptoms?" (e.g., toothache, leg swelling)     Patient does feel a little lightheaded 10. PREGNANCY: "Is there any chance you are pregnant?" "When was your last menstrual period?"       IUD- irregular January  Answer Assessment - Initial Assessment Questions 1. LOCATION: "Where does it hurt?"      Not asked 2. ONSET: "When did the headache start?" (Minutes, hours or days)      3 days 3. PATTERN: "Does the pain come and go, or has it been constant since it  started?"     Constant- has used OTC to decrease pain 4. SEVERITY: "How bad is the pain?" and "What does it keep you from doing?"  (e.g., Scale 1-10; mild, moderate, or severe)   - MILD (1-3): doesn't interfere with normal activities    - MODERATE (4-7): interferes with normal activities or awakens from sleep    - SEVERE (8-10): excruciating pain, unable to do any normal activities        7-8 5. RECURRENT SYMPTOM: "Have you ever had headaches before?" If so, ask: "When was the last time?" and "What happened that time?"      no 6. CAUSE: "What do you think is causing the headache?"     Not asked 7. MIGRAINE: "Have you been diagnosed with migraine headaches?" If so, ask: "Is this headache similar?"      Not asked 8. HEAD INJURY: "Has there been any recent injury to the head?"      Not asked 9. OTHER SYMPTOMS: "Do you have any other symptoms?" (fever, stiff neck, eye pain, sore throat, cold symptoms)     Facial swelling 10. PREGNANCY: "Is there any chance you are pregnant?" "When was your last menstrual period?"  IUD  Protocols used: Massie Maroon [facility]

## 2017-09-26 NOTE — Telephone Encounter (Signed)
Noted. Agree with emergent eval in ED or urgent care today.

## 2017-09-27 ENCOUNTER — Telehealth: Payer: Self-pay | Admitting: Emergency Medicine

## 2017-09-27 ENCOUNTER — Emergency Department (HOSPITAL_COMMUNITY): Payer: BLUE CROSS/BLUE SHIELD

## 2017-09-27 DIAGNOSIS — R220 Localized swelling, mass and lump, head: Secondary | ICD-10-CM | POA: Diagnosis not present

## 2017-09-27 DIAGNOSIS — R51 Headache: Secondary | ICD-10-CM | POA: Diagnosis not present

## 2017-09-27 MED ORDER — METHYLPREDNISOLONE SODIUM SUCC 125 MG IJ SOLR
125.0000 mg | Freq: Once | INTRAMUSCULAR | Status: AC
  Administered 2017-09-27: 125 mg via INTRAVENOUS
  Filled 2017-09-27: qty 2

## 2017-09-27 MED ORDER — DIPHENHYDRAMINE HCL 50 MG/ML IJ SOLN
25.0000 mg | Freq: Once | INTRAMUSCULAR | Status: AC
  Administered 2017-09-27: 25 mg via INTRAVENOUS
  Filled 2017-09-27: qty 1

## 2017-09-27 MED ORDER — SODIUM CHLORIDE 0.9 % IV BOLUS
500.0000 mL | Freq: Once | INTRAVENOUS | Status: AC
  Administered 2017-09-27: 500 mL via INTRAVENOUS

## 2017-09-27 MED ORDER — PREDNISONE 20 MG PO TABS: 60.0000 mg | ORAL_TABLET | Freq: Every day | ORAL | 0 refills | Status: DC

## 2017-09-27 MED ORDER — PROCHLORPERAZINE EDISYLATE 5 MG/ML IJ SOLN
10.0000 mg | Freq: Once | INTRAMUSCULAR | Status: AC
  Administered 2017-09-27: 10 mg via INTRAVENOUS
  Filled 2017-09-27: qty 2

## 2017-09-27 NOTE — Telephone Encounter (Signed)
Tried contacting pt to schedule appt on home phone, LVM on cell phone, Work phone is no longer a working number. Advised to call back and schedule appt with Dr Quay Burow.

## 2017-09-27 NOTE — Telephone Encounter (Signed)
noted 

## 2017-09-27 NOTE — ED Provider Notes (Signed)
[place] EMERGENCY DEPARTMENT Provider Note   CSN: [id number] Arrival date & time: 09/26/17  1751     History   Chief Complaint Chief Complaint  Patient presents with  . Headache    HPI Erin [name] is a 52 y.o. female.  HPI 52 year old American female past medical history significant for Graves' disease, hypertension that presents to the emergency department today with complaints of left-sided temporal headache.  Patient states that for the past 3-4 days she had pain of her left temple area that is worse with palpation.  Patient denies any associated visual changes but states that it feels "off".  States that she has poor vision secondary to a bad eye infection several years ago and states that her vision has not seemed to change.  Patient denies a history of headaches.  The headache was gradual in onset.  Denies any red flag symptoms such as fevers, weight loss, maximal in onset or acute in onset.  Patient states that ibuprofen improves the pain temporarily than it returns.  No known triggering factors.  Patient denies any associated photophobia, jaw pain, lightheadedness.  Does report intermittent dizziness at times but denies any currently.  Patient denies any autoimmune disorders.   Pt denies any fever, chill, vision changes, lightheadedness,congestion, neck pain, cp, sob, cough, abd pain, n/v/d, urinary symptoms, change in bowel habits, melena, hematochezia, lower extremity paresthesias.  Past Medical History:  Diagnosis Date  . Elevated blood-pressure reading without diagnosis of hypertension   . Endometriosis   . Fibroids   . Gestational diabetes   . Graves disease   . Grief at loss of child   . Hypothyroidism   . Menorrhagia   . Premature ventricular contractions    a. rare PVC by monitor 12/2016.  [name] Thyroid disease   . Uterine leiomyoma     Patient Active Problem List   Diagnosis Date Noted  . Right foot pain 07/15/2017  . Grief at loss of  child 11/02/2016  . H/O gestational diabetes mellitus, not currently pregnant 09/28/2015  . Hypertension 09/28/2015  . Prediabetes 09/28/2015  . Morbid obesity ([place]) 05/17/2015  . Umbilical hernia 34/19/3790  . Chronic lower back pain 05/17/2015  . Snoring 05/17/2015  . Palpitations 05/17/2015  . ANEMIA 09/03/2007  . Hypothyroidism following radioiodine therapy 09/02/2007  . INTERSTITIAL CYSTITIS 09/01/2007  . ALLERGIC RHINITIS 08/07/2007  . [place] DISEASE, LUMBAR 08/07/2007    Past Surgical History:  Procedure Laterality Date  . APPENDECTOMY    . CESAREAN SECTION    . CHOLECYSTECTOMY    . HERNIA REPAIR    . TUMOR REMOVAL  fibroids     OB History   None      Home Medications    Prior to Admission medications   Medication Sig Start Date End Date Taking? Authorizing Provider  cholecalciferol (VITAMIN D) 1000 units tablet Take 1,000 Units by mouth daily.   Yes [provider]  levonorgestrel (MIRENA, 52 MG,) 20 MCG/24HR IUD Mirena 20 mcg/24 hr (5 years) intrauterine device  Take 1 device by intrauterine route. 01/12/16  Yes [provider]  levothyroxine (SYNTHROID, LEVOTHROID) 125 MCG tablet Take 125 mcg by mouth daily before breakfast.   Yes [provider]  losartan (COZAAR) 50 MG tablet Take 1 tablet (50 mg total) by mouth daily. 11/08/16  Yes Burns, Claudina Lick, MD  Diclofenac Sodium (PENNSAID) 2 % SOLN Place 1 application onto the skin 2 (two) times daily. Patient not taking: Reported on 09/27/2017 07/15/17  Rosemarie Ax, MD  methocarbamol (ROBAXIN) 500 MG tablet Take 1 tablet (500 mg total) by mouth every 6 (six) hours as needed for muscle spasms. Patient not taking: Reported on 09/27/2017 08/16/17   Binnie Rail, MD  predniSONE (DELTASONE) 20 MG tablet Take 3 tablets (60 mg total) by mouth daily with breakfast. 09/27/17   Doristine Devoid, PA-C  UNITHROID 125 MCG tablet Take 1 tablet (125 mcg total) by mouth daily before breakfast. Patient not  taking: Reported on 09/27/2017 08/29/17   Elayne Snare, MD    Family History Family History  Problem Relation Age of Onset  . Hypertension Mother   . Cancer Mother   . Hypertension Father   . Diabetes Father   . Cancer Father     Social History Social History   Tobacco Use  . Smoking status: Never Smoker  . Smokeless tobacco: Never Used  Substance Use Topics  . Alcohol use: No  . Drug use: No     Allergies   Prozac [fluoxetine hcl] and Hydrocodone   Review of Systems Review of Systems  All other systems reviewed and are negative.    Physical Exam Updated Vital Signs BP (!) 137/91   Pulse 73   Temp 98.6 F (37 C) (Oral)   Resp 16   Ht 5' 5"  (1.651 m)   Wt (!) 154.2 kg (340 lb)   SpO2 97%   BMI 56.58 kg/m   Physical Exam  Constitutional: She is oriented to person, place, and time. She appears well-developed and well-nourished.  Non-toxic appearance. No distress.  HENT:  Head: Normocephalic and atraumatic.  Nose: Nose normal.  Mouth/Throat: Oropharynx is clear and moist.  He does have pain with palpation of the left temporal region of the head.  Unable to visualize bilateral TMs secondary to cerumen impaction.  No mastoid tenderness.  Eyes: Pupils are equal, round, and reactive to light. Conjunctivae and EOM are normal. Right eye exhibits no discharge. Left eye exhibits no discharge.    Visual Acuity  Right Eye Distance: 10/25 Left Eye Distance: 10/50 Bilateral Distance: 10/20  Right Eye Near:   Left Eye Near:    Bilateral Near:      Neck: Normal range of motion. Neck supple.  Cardiovascular: Normal rate, regular rhythm, normal heart sounds and intact distal pulses.  Pulmonary/Chest: Effort normal and breath sounds normal. No respiratory distress. She exhibits no tenderness.  Abdominal: Soft. Bowel sounds are normal. There is no tenderness. There is no rebound and no guarding.  Musculoskeletal: Normal range of motion. She exhibits no tenderness.    Lymphadenopathy:    She has no cervical adenopathy.  Neurological: She is alert and oriented to person, place, and time.  The patient is alert, attentive, and oriented x 3. Speech is clear. Cranial nerve II-VII grossly intact. Negative pronator drift. Sensation intact. Strength 5/5 in all extremities. Reflexes 2+ and symmetric at biceps, triceps, knees, and ankles. Rapid alternating movement and fine finger movements intact. Romberg is absent. Posture and gait normal.   Skin: Skin is warm and dry. Capillary refill takes less than 2 seconds.  Psychiatric: Her behavior is normal. Judgment and thought content normal.  Nursing note and vitals reviewed.    ED Treatments / Results  Labs (all labs ordered are listed, but only abnormal results are displayed) Labs Reviewed  CBC WITH DIFFERENTIAL/PLATELET - Abnormal; Notable for the following components:      Result Value   WBC 11.6 (*)    Hemoglobin  10.7 (*)    HCT 34.5 (*)    MCH 25.5 (*)    Lymphs Abs 5.1 (*)    All other components within normal limits  SEDIMENTATION RATE - Abnormal; Notable for the following components:   Sed Rate 58 (*)    All other components within normal limits  BASIC METABOLIC PANEL  C-REACTIVE PROTEIN  I-STAT BETA HCG BLOOD, ED ([facility], [facility], [facility] ONLY)    EKG None  Radiology Ct Head Wo Contrast  Result Date: 09/27/2017 CLINICAL DATA:  Three day history of left temporal pain. Tenderness to touch. Swelling of the left side of the face. EXAM: CT HEAD WITHOUT CONTRAST TECHNIQUE: Contiguous axial images were obtained from the base of the skull through the vertex without intravenous contrast. COMPARISON:  06/17/2011 FINDINGS: Brain: No evidence of acute infarction, hemorrhage, hydrocephalus, extra-axial collection or mass lesion/mass effect. Vascular: No hyperdense vessel or unexpected calcification. Skull: Normal. Negative for fracture or focal lesion. Sinuses/Orbits: No acute finding. Other: None. IMPRESSION: No acute  intracranial abnormalities. Electronically Signed   By: Lucienne Capers M.D.   On: 09/27/2017 06:01    Procedures Procedures (including critical care time)  Medications Ordered in ED Medications  prochlorperazine (COMPAZINE) injection 10 mg (10 mg Intravenous Given 09/27/17 0552)  diphenhydrAMINE (BENADRYL) injection 25 mg (25 mg Intravenous Given 09/27/17 0552)  methylPREDNISolone sodium succinate (SOLU-MEDROL) 125 mg/2 mL injection 125 mg (125 mg Intravenous Given 09/27/17 0552)  sodium chloride 0.9 % bolus 500 mL (0 mLs Intravenous Stopped 09/27/17 0631)     Initial Impression / Assessment and Plan / ED Course  I have reviewed the triage vital signs and the nursing notes.  Pertinent labs & imaging results that were available during my care of the patient were reviewed by me and considered in my medical decision making (see chart for details).     Patient presents to the emergency department today for evaluation of temporal headache that is worse with palpation for the past 3 days.  No history of headaches.  Patient denies any associated visual changes.  Denies any red flag symptoms concerning for [facility], ICH, meningitis.  Given that patient was significantly tender over the temporal region concern for possible temporal arteritis however patient seems very young for this diagnosis.  Reassuring that patient has no visual changes.  CT scan of head was unremarkable.  Lab work is overall reassuring.  ESR P is mildly elevated at 60.  CRP is normal.  Mild leukocytosis of 11,000.  Vital signs are reassuring.  Patient treated with migraine cocktail feels significantly improved however she does report some tenderness to palpation of the left temporal region.  Patient given Solu-Medrol in the ED.  I did discuss with my attending who we both agree to start patient on high-dose prednisone with neurology and vascular follow-up.  Have also sent patient's primary care doctor message in epic who will see patient  in the office.  I discussed side effects of steroids.  Also discussed with patient that she does not need to stop this medication abruptly.  Patient needs close follow-up and have given her return precautions.  Pt is hemodynamically stable, in NAD, & able to ambulate in the ED. Evaluation does not show pathology that would require ongoing emergent intervention or inpatient treatment. I explained the diagnosis to the patient. Pain has been managed & has no complaints prior to dc. Pt is comfortable with above plan and is stable for discharge at this time. All questions were answered prior to  disposition. Strict return precautions for f/u to the ED were discussed. Encouraged follow up with PCP.  Pt is hemodynamically stable, in NAD, & able to ambulate in the ED. Evaluation does not show pathology that would require ongoing emergent intervention or inpatient treatment. I explained the diagnosis to the patient. Pain has been managed & has no complaints prior to dc. Pt is comfortable with above plan and is stable for discharge at this time. All questions were answered prior to disposition. Strict return precautions for f/u to the ED were discussed. Encouraged follow up with PCP.    Final Clinical Impressions(s) / ED Diagnoses   Final diagnoses:  Temporal headache    ED Discharge Orders        Ordered    predniSONE (DELTASONE) 20 MG tablet  Daily with breakfast     09/27/17 0707    Ambulatory referral to Neurology    Comments:  An appointment is requested in approximately: 1 week   09/27/17 0708       Doristine Devoid, PA-C 09/27/17 0817    Ward, Delice Bison, DO 09/28/17 6520

## 2017-09-27 NOTE — Discharge Instructions (Addendum)
Your imaging and lab work has been reassuring.  Unknown cause of your headache.  May be related to temporal arteritis.  We will start her on high-dose steroids.  Start taking the prednisone 60 mg starting tomorrow.  You need to take this into you follow-up with your primary care doctor within the next 4-5 days.  Have also given you a referral to neurology and vascular surgery.  Please do not stop taking this medication abruptly.  This medication will need to be tapered if it is decreased.  Please make sure that you see your primary care doctor the next 3-4 days.  Return to the ED if you develop any vision changes, worsening pain or for any other reason.  The prednisone that can cause weight gain, elevated blood glucose levels.

## 2017-09-27 NOTE — ED Notes (Signed)
Pt denied to have vital signs checked when asked.

## 2017-09-27 NOTE — Telephone Encounter (Signed)
-----  Message from Binnie Rail, MD sent at 09/27/2017  8:02 AM EDT ----- Can you make sure she is here on Monday - make sure she stays on the prednisone until she sees me - she should call with questions or concerns.   ----- Message ----- From: Aaron Edelman Sent: 09/27/2017   6:19 AM To: Binnie Rail, MD  Hi,  I saw this pt in the Ed for left temporal headache.  Tender to palpation over the left temporal area.  The patient has normal visual acuity.  Patient's ESR was mildly elevated in the ED.  Normal CRP.  No leukocytosis.  CT scan of head was normal.  Migraine cocktail improved patient's headache.  Did give patient Solu-Medrol.  I have given a prescription for high-dose prednisone 60 mg/day.  Given the elevated ESRP with tenderness of the temporal area will treat for temporal arteritis.  Have given her neurology and vascular follow-up.  I would like for her to see you the next 2-3 days and.  Discussed that she does not need to abruptly stop the prednisone.   Thank you, Melina Schools PA-C

## 2017-10-03 DIAGNOSIS — M316 Other giant cell arteritis: Secondary | ICD-10-CM | POA: Insufficient documentation

## 2017-10-03 NOTE — Progress Notes (Signed)
Subjective:    Patient ID: Erin [name], female    DOB: Oct 22, 1965, 52 y.o.   MRN: [id number]  HPI The patient is here for follow up from the hospital.   ED 09/26/17 for left sided temporal headache for 3-4 days.  The pain was worse with palpation.  She felt off, but denied vision change.  She typically does not get headaches.  She denied fever, weight loss, jar pain, lightheadedness, photophobia.  Advil improved the pain temporarily.   She had pain with palpation of the left temporal region.  She received compazine, benadryl, solu-medrol and IVF.  Ct pf head was normal. CRP normal.  ESR slightly elevated at 60. WBC 11,000.  She felt better after meds in the hospital.  She was started on high dose prednisone with neuro and vascular follow up.  No vascular follow up was arranged.  She was referred to neurology, but prefers to see a different neurologist.  She is taking prednisone 60 mg daily.  She takes it with breakfast.  She denies headaches, changes in vision.  She denies jaw pain, neck pain, dizziness lightheadedness.  She denies any fevers, chills and body aches.  She has been experiencing increased energy and heartburn.       Medications and allergies reviewed with patient and updated if appropriate.  Patient Active Problem List   Diagnosis Date Noted  . Temporal arteritis ([place]) 10/03/2017  . Right foot pain 07/15/2017  . Grief at loss of child 11/02/2016  . H/O gestational diabetes mellitus, not currently pregnant 09/28/2015  . Hypertension 09/28/2015  . Prediabetes 09/28/2015  . Morbid obesity ([place]) 05/17/2015  . Umbilical hernia 11/57/2620  . Chronic lower back pain 05/17/2015  . Snoring 05/17/2015  . Palpitations 05/17/2015  . ANEMIA 09/03/2007  . Hypothyroidism following radioiodine therapy 09/02/2007  . INTERSTITIAL CYSTITIS 09/01/2007  . ALLERGIC RHINITIS 08/07/2007  . [place] DISEASE, LUMBAR 08/07/2007    Current Outpatient Medications on File Prior to Visit    Medication Sig Dispense Refill  . cholecalciferol (VITAMIN D) 1000 units tablet Take 1,000 Units by mouth daily.    [name] levonorgestrel (MIRENA, 52 MG,) 20 MCG/24HR IUD Mirena 20 mcg/24 hr (5 years) intrauterine device  Take 1 device by intrauterine route.    [name] levothyroxine (SYNTHROID, LEVOTHROID) 125 MCG tablet Take 125 mcg by mouth daily before breakfast.    . losartan (COZAAR) 50 MG tablet Take 1 tablet (50 mg total) by mouth daily. 90 tablet 3  . predniSONE (DELTASONE) 20 MG tablet Take 3 tablets (60 mg total) by mouth daily with breakfast. 90 tablet 0   No current facility-administered medications on file prior to visit.     Past Medical History:  Diagnosis Date  . Elevated blood-pressure reading without diagnosis of hypertension   . Endometriosis   . Fibroids   . Gestational diabetes   . Graves disease   . Grief at loss of child   . Hypothyroidism   . Menorrhagia   . Premature ventricular contractions    a. rare PVC by monitor 12/2016.  [name] Thyroid disease   . Uterine leiomyoma     Past Surgical History:  Procedure Laterality Date  . APPENDECTOMY    . CESAREAN SECTION    . CHOLECYSTECTOMY    . HERNIA REPAIR    . TUMOR REMOVAL  fibroids    Social History   Socioeconomic History  . Marital status: Married    Spouse name: Not on file  . Number of  children: Not on file  . Years of education: Not on file  . Highest education level: Not on file  Occupational History  . Not on file  Social Needs  . Financial resource strain: Not on file  . Food insecurity:    Worry: Not on file    Inability: Not on file  . Transportation needs:    Medical: Not on file    Non-medical: Not on file  Tobacco Use  . Smoking status: Never Smoker  . Smokeless tobacco: Never Used  Substance and Sexual Activity  . Alcohol use: No  . Drug use: No  . Sexual activity: Not on file  Lifestyle  . Physical activity:    Days per week: Not on file    Minutes per session: Not on file  .  Stress: Not on file  Relationships  . Social connections:    Talks on phone: Not on file    Gets together: Not on file    Attends religious service: Not on file    Active member of club or organization: Not on file    Attends meetings of clubs or organizations: Not on file    Relationship status: Not on file  Other Topics Concern  . Not on file  Social History Narrative  . Not on file    Family History  Problem Relation Age of Onset  . Hypertension Mother   . Cancer Mother   . Hypertension Father   . Diabetes Father   . Cancer Father     Review of Systems  Constitutional: Positive for appetite change (increased). Negative for chills and fever.  Eyes: Negative for visual disturbance.  Respiratory: Negative for cough, shortness of breath and wheezing.   Cardiovascular: Negative for chest pain, palpitations and leg swelling.  Gastrointestinal: Negative for abdominal pain, blood in stool, constipation, diarrhea, nausea and vomiting.       GERD  Musculoskeletal: Negative for arthralgias, back pain and myalgias.  Neurological: Negative for dizziness, light-headedness and headaches.       Objective:   Vitals:   10/04/17 1318  BP: (!) 144/80  Pulse: 93  Resp: 18  Temp: 99.1 F (37.3 C)  SpO2: 97%   BP Readings from Last 3 Encounters:  10/04/17 (!) 144/80  09/27/17 (!) 137/91  08/29/17 130/78   Wt Readings from Last 3 Encounters:  10/04/17 (!) 338 lb (153.3 kg)  09/26/17 (!) 340 lb (154.2 kg)  08/29/17 (!) 340 lb (154.2 kg)   Body mass index is 56.25 kg/m.   Physical Exam    Constitutional: Appears well-developed and well-nourished. No distress.  HENT:  Head: Normocephalic and atraumatic.  Bilateral temporal arteries nontender Neck: EOMi, normal conjunctivae.  Neck supple. No tracheal deviation present. No thyromegaly present.  No cervical lymphadenopathy Cardiovascular: Normal rate, regular rhythm and normal heart sounds.   No murmur heard. No carotid bruit  .  No edema Pulmonary/Chest: Effort normal and breath sounds normal. No respiratory distress. No has no wheezes. No rales.  Neurological: Cranial nerves intact, normal sensation and strength in all extremities, gait normal Skin: Skin is warm and dry. Not diaphoretic.  Psychiatric: Normal mood and affect. Behavior is normal.      Assessment & Plan:    See Problem List for Assessment and Plan of chronic medical problems.

## 2017-10-04 ENCOUNTER — Ambulatory Visit: Payer: BLUE CROSS/BLUE SHIELD | Admitting: Internal Medicine

## 2017-10-04 ENCOUNTER — Encounter: Payer: Self-pay | Admitting: Internal Medicine

## 2017-10-04 VITALS — BP 144/80 | HR 93 | Temp 99.1°F | Resp 18 | Ht 65.0 in | Wt 338.0 lb

## 2017-10-04 DIAGNOSIS — M316 Other giant cell arteritis: Secondary | ICD-10-CM | POA: Diagnosis not present

## 2017-10-04 DIAGNOSIS — K219 Gastro-esophageal reflux disease without esophagitis: Secondary | ICD-10-CM

## 2017-10-04 MED ORDER — PANTOPRAZOLE SODIUM 40 MG PO TBEC: 40.0000 mg | DELAYED_RELEASE_TABLET | Freq: Every day | ORAL | 3 refills | Status: DC

## 2017-10-04 NOTE — Patient Instructions (Addendum)
Medications reviewed and updated.  Changes include starting protonix for your heartburn.   Your prescription(s) have been submitted to your pharmacy. Please take as directed and contact our office if you believe you are having problem(s) with the medication(s).  A referral was ordered for neurology and an eye doctor.    Take 60 mg daily for now.  Decrease to 50 mg daily on 4/19 Decrease to 40 mg after two weeks, on May 3rd, have blood work done    Temporal Arteritis Temporal arteritis, also called giant cell arteritis, is a condition that causes arteries to become swollen (inflamed). It usually affects arteries in your head and face, but arteries in any part of the body can become inflamed. Temporal arteritis can cause serious problems, such as bone loss, diabetes, and blindness. What are the causes? The cause is unknown. What increases the risk?  Being older than 59.  Being a woman.  Being Caucasian.  Being of [place], [place], [place], [place] or [place] ancestry.  Having polymyalgia rheumatica (PMR). What are the signs or symptoms? Some people with temporal arteritis have just one symptom, while other have several symptoms. Most signs and symptoms are related to the head and face. Signs and symptoms may include:  Hard or swollen temples (common). Your temples are the flattened area on either side of your forehead. If your temples are swollen, it may hurt to touch them.  Pain when combing your hair or when laying your head down.  Pain in the jaw when chewing.  Pain in the throat or tongue.  Problems with your vision, such as sudden loss of vision in one eye, or seeing double.  Fever.  Fatigue.  A dry cough.  Pain in the hips and shoulders.  Pain in the arms during exercise.  Depression.  Weight loss.  How is this diagnosed? Your health care provider will ask about your symptoms and do a physical exam. He or she may also perform an eye exam and tests,  such as:  A complete blood count.  An erythrocyte sedimentation rate test, also called the sed rate test.  A C-reactive protein (CRP) test.  A tissue sample (biopsy) test.  How is this treated? Temporal arteritis is treated with a type of medicine called a corticosteroid. Vision problems may be treated with additional medicines. You will need to see your health care provider while you are being treated. During follow-up visits, your health care provider will check for problems by:  Performing blood tests and bone density tests.  Checking your blood pressure and blood sugar.  Follow these instructions at home:  Take medicines only as directed by your health care provider.  Take any vitamins or supplements that your health care provider suggests. These may include vitamin D and calcium, which help keep your bones from becoming weak.  Exercise. Talk with your health care provider about what exercises are okay for you to do. Usually exercises that increase your heart rate (aerobic exercise), such as walking, are recommended. Aerobic exercise helps control your blood pressure and prevent bone loss.  Follow a healthy diet. Include healthy sources of protein, fruits, vegetables, and whole grains in your diet. Following a healthy diet helps prevent bone damage and diabetes. Contact a health care provider if:  Your symptoms get worse.  Your fever, fatigue, headache, weight loss, or pain in your jaw gets worse.  You develop signs of infection, such as fever, swelling, redness, warmth, and tenderness. Get help right away if:  Your  vision gets worse.  Your pain does not go away, even after you take pain medicine.  You have chest pain.  You have trouble breathing.  One side of your face or body suddenly becomes weak or numb. This information is not intended to replace advice given to you by your health care provider. Make sure you discuss any questions you have with your health care  provider. Document Released: 04/08/2009 Document Revised: 02/09/2016 Document Reviewed: 08/05/2013 Elsevier Interactive Patient Education  [employer].

## 2017-10-05 DIAGNOSIS — K219 Gastro-esophageal reflux disease without esophagitis: Secondary | ICD-10-CM | POA: Insufficient documentation

## 2017-10-05 NOTE — Assessment & Plan Note (Signed)
New Secondary to high-dose prednisone Start pantoprazole 40 mg daily-advised to take 30 minutes prior to a meal Advised her to call if heartburn does not improve

## 2017-10-05 NOTE — Assessment & Plan Note (Addendum)
New diagnosis Would like to see a specific neurologist-new referral placed Unfortunately she did not have a biopsy done.  Symptoms have completely resolved with high-dose steroid We will continue 60 mg for a full 2 weeks and then decrease to 50 mg for 2 weeks and then to 40 mg-needs blood work around that time to check ESR When she sees neurology she will determine if they will be managing her steroids or if we will be doing it Advised her not concerning symptoms to monitor for including blurry vision, jaw pain, neck pain, headaches, temporal artery pain and to call immediately if she experiences any Discussed her diagnosis and treatment She does not have an eye doctor and encouraged her to get a exam-both because of her temporal arteritis and for general purposes-referral placed

## 2017-10-14 ENCOUNTER — Encounter (HOSPITAL_COMMUNITY): Payer: Self-pay | Admitting: Emergency Medicine

## 2017-10-14 ENCOUNTER — Emergency Department (HOSPITAL_COMMUNITY)
Admission: EM | Admit: 2017-10-14 | Discharge: 2017-10-14 | Disposition: A | Discharge status: Home / Self Care | Payer: BLUE CROSS/BLUE SHIELD | Attending: Emergency Medicine | Admitting: Emergency Medicine

## 2017-10-14 DIAGNOSIS — B370 Candidal stomatitis: Secondary | ICD-10-CM | POA: Diagnosis not present

## 2017-10-14 DIAGNOSIS — J029 Acute pharyngitis, unspecified: Secondary | ICD-10-CM

## 2017-10-14 DIAGNOSIS — E039 Hypothyroidism, unspecified: Secondary | ICD-10-CM | POA: Diagnosis not present

## 2017-10-14 DIAGNOSIS — Z79899 Other long term (current) drug therapy: Secondary | ICD-10-CM | POA: Insufficient documentation

## 2017-10-14 DIAGNOSIS — I10 Essential (primary) hypertension: Secondary | ICD-10-CM | POA: Diagnosis not present

## 2017-10-14 LAB — GROUP A STREP BY PCR: Group A Strep by PCR: NOT DETECTED

## 2017-10-14 MED ORDER — NYSTATIN 100000 UNIT/ML MT SUSP: 500000.0000 [IU] | Freq: Four times a day (QID) | OROMUCOSAL | 0 refills | Status: AC

## 2017-10-14 NOTE — ED Provider Notes (Signed)
Emergency Department Provider Note   I have reviewed the triage vital signs and the nursing notes.   HISTORY  Chief Complaint Oral Swelling   HPI Erin [name] is a 52 y.o. female with PMH with PMH of graves disease, elevated BP, and recent diagnosis of temporal arteritis on prednisone x 3 weeks presents to the emergency department with sore throat and dry mouth.  The patient states that this morning her mouth was very dry and her tongue felt large and she had difficulty swallowing.  She describes soreness in her throat.  She has been on steroids for the past 3 weeks for temporal arteritis and those symptoms seem to be improving.  Denies fevers or chills.  No pain in the chest.  No sensation of food getting stuck in the throat or chest.  No itchy rash.  Symptoms have improved since arrival in the emergency department without acute intervention.  She has no prior history of anaphylaxis.    Past Medical History:  Diagnosis Date  . Elevated blood-pressure reading without diagnosis of hypertension   . Endometriosis   . Fibroids   . Gestational diabetes   . Graves disease   . Grief at loss of child   . Hypothyroidism   . Menorrhagia   . Premature ventricular contractions    a. rare PVC by monitor 12/2016.  [name] Thyroid disease   . Uterine leiomyoma     Patient Active Problem List   Diagnosis Date Noted  . Gastroesophageal reflux disease 10/05/2017  . Temporal arteritis ([place]) 10/03/2017  . Right foot pain 07/15/2017  . Grief at loss of child 11/02/2016  . H/O gestational diabetes mellitus, not currently pregnant 09/28/2015  . Hypertension 09/28/2015  . Prediabetes 09/28/2015  . Morbid obesity ([place]) 05/17/2015  . Umbilical hernia 93/23/5573  . Chronic lower back pain 05/17/2015  . Snoring 05/17/2015  . Palpitations 05/17/2015  . ANEMIA 09/03/2007  . Hypothyroidism following radioiodine therapy 09/02/2007  . INTERSTITIAL CYSTITIS 09/01/2007  . ALLERGIC RHINITIS 08/07/2007    . [place] DISEASE, LUMBAR 08/07/2007    Past Surgical History:  Procedure Laterality Date  . APPENDECTOMY    . CESAREAN SECTION    . CHOLECYSTECTOMY    . HERNIA REPAIR    . TUMOR REMOVAL  fibroids    Current Outpatient Rx  . Order #: [id number] Class: Historical Med  . Order #: [id number] Class: Historical Med  . Order #: [id number] Class: Historical Med  . Order #: [id number] Class: Normal  . Order #: [id number] Class: Normal  . Order #: [id number] Class: Print  . Order #: [id number] Class: Print    Allergies Prozac [fluoxetine hcl] and Hydrocodone  Family History  Problem Relation Age of Onset  . Hypertension Mother   . Cancer Mother   . Hypertension Father   . Diabetes Father   . Cancer Father     Social History Social History   Tobacco Use  . Smoking status: Never Smoker  . Smokeless tobacco: Never Used  Substance Use Topics  . Alcohol use: No  . Drug use: No    Review of Systems  Constitutional: No fever/chills Eyes: No visual changes. ENT: Positive sore throat, dry mouth, and tongue swelling.  Cardiovascular: Denies chest pain. Respiratory: Denies shortness of breath. Gastrointestinal: No abdominal pain.  No nausea, no vomiting.  No diarrhea.  No constipation. Genitourinary: Negative for dysuria. Musculoskeletal: Negative for back pain. Skin: Negative for rash. Neurological: Negative for headaches, focal weakness or numbness.  10-point ROS otherwise negative.  ____________________________________________  PHYSICAL EXAM:  VITAL SIGNS: ED Triage Vitals  Enc Vitals Group     BP 10/14/17 0650 131/90     Pulse Rate 10/14/17 0650 75     Resp 10/14/17 0650 18     Temp 10/14/17 0650 98.8 F (37.1 C)     Temp Source 10/14/17 0650 Oral     SpO2 10/14/17 0650 99 %     Weight 10/14/17 0650 (!) 332 lb 4.8 oz (150.7 kg)     Height 10/14/17 0650 5\' 5"  (1.651 m)     Pain Score 10/14/17 0715 10   Constitutional: Alert and oriented. Well appearing and in no acute  distress. Eyes: Conjunctivae are normal.  Head: Atraumatic. Nose: No congestion/rhinnorhea. Mouth/Throat: Mucous membranes are moist.  Oropharynx with mild erythema. White covering over the posterior tongue able to be removed with tongue depressor.  Neck: No stridor. Cardiovascular: Normal rate, regular rhythm. Good peripheral circulation. Grossly normal heart sounds.   Respiratory: Normal respiratory effort.  No retractions. Lungs CTAB. Gastrointestinal: Soft and nontender. No distention.  Musculoskeletal: No lower extremity tenderness nor edema. No gross deformities of extremities. Neurologic:  Normal speech and language. No gross focal neurologic deficits are appreciated.  Skin:  Skin is warm, dry and intact. No rash noted.  ____________________________________________   LABS (all labs ordered are listed, but only abnormal results are displayed)  Labs Reviewed  GROUP A STREP BY PCR   ____________________________________________   PROCEDURES  Procedure(s) performed:   Procedures  None ____________________________________________   INITIAL IMPRESSION / ASSESSMENT AND PLAN / ED COURSE  Pertinent labs & imaging results that were available during my care of the patient were reviewed by me and considered in my medical decision making (see chart for details).  Patient presents to the emergency department for evaluation of sore throat with dry mouth and sensation of tongue swelling.  No evidence to suggest an acute allergic reaction.  The patient's tongue appears normal size.  I am able to visualize her posterior pharynx without difficulty.  I do not appreciate any peritonsillar abscess.  She does have an exam concerning for development of thrush which correlates well clinically with her steroid use in the last 3 weeks.  Will obtain PCR to rule out strep but if negative plan for treatment with nystatin and referral back to PCP.  Also provide contact information for gastroenterology  for upper endoscopy if symptoms do not improve with nystatin.  10:10 AM Rapid strep reviewed and negative.  Plan to treat patient with oral nystatin swish and swallow for 14 days.  Will follow with PCP and gastroenterology as needed.  No evidence to suspect acute allergic reaction.   At this time, I do not feel there is any life-threatening condition present. I have reviewed and discussed all results (EKG, imaging, lab, urine as appropriate), exam findings with patient. I have reviewed nursing notes and appropriate previous records.  I feel the patient is safe to be discharged home without further emergent workup. Discussed usual and customary return precautions. Patient and family (if present) verbalize understanding and are comfortable with this plan.  Patient will follow-up with their primary care provider. If they do not have a primary care provider, information for follow-up has been provided to them. All questions have been answered.  ____________________________________________  FINAL CLINICAL IMPRESSION(S) / ED DIAGNOSES  Final diagnoses:  Sore throat  Oral candidiasis     NEW OUTPATIENT MEDICATIONS STARTED DURING THIS VISIT:  New Prescriptions   NYSTATIN (MYCOSTATIN) 100000 UNIT/ML  SUSPENSION    Take 5 mLs (500,000 Units total) by mouth 4 (four) times daily for 14 days.    Note:  This document was prepared using Dragon voice recognition software and may include unintentional dictation errors.  Nanda Quinton, MD Emergency Medicine    Long, [facility], MD 10/14/17 1014

## 2017-10-14 NOTE — ED Triage Notes (Signed)
Pt reports this am she woke up and felt her tongue was swollen and stuck to roof of her mouth.  Reports she "swallowed really hard and got it down".  Been on steroids for 3 weeks. Reports is painful when she swallows. Able to talk in full sentences.

## 2017-10-14 NOTE — Discharge Instructions (Signed)
You were seen in the ED today with sore throat. You do not have strep throat but may have a type of yeast infection in the throat brought on by your steroid medication. Continue your steroids as directed but begin taking Nystatin 4 times daily for 2 weeks. You will swish this medication around your mouth and then swallow. If symptoms continue you will need to follow up with your PCP and possibly the Gastroenterologist.   Return to the ED with any sudden tongue swelling, difficulty breathing, or other concerning symptoms.

## 2017-10-15 ENCOUNTER — Encounter: Payer: Self-pay | Admitting: Internal Medicine

## 2017-10-17 ENCOUNTER — Other Ambulatory Visit: Payer: Self-pay

## 2017-10-17 ENCOUNTER — Other Ambulatory Visit: Payer: Self-pay | Admitting: Endocrinology

## 2017-10-17 MED ORDER — LEVOTHYROXINE SODIUM 137 MCG PO TABS: ORAL_TABLET | ORAL | 2 refills | Status: DC

## 2017-10-21 DIAGNOSIS — E039 Hypothyroidism, unspecified: Secondary | ICD-10-CM | POA: Diagnosis not present

## 2017-10-21 DIAGNOSIS — E669 Obesity, unspecified: Secondary | ICD-10-CM | POA: Diagnosis not present

## 2017-10-21 DIAGNOSIS — G4733 Obstructive sleep apnea (adult) (pediatric): Secondary | ICD-10-CM | POA: Diagnosis not present

## 2017-10-21 DIAGNOSIS — M316 Other giant cell arteritis: Secondary | ICD-10-CM | POA: Diagnosis not present

## 2017-10-22 ENCOUNTER — Ambulatory Visit (INDEPENDENT_AMBULATORY_CARE_PROVIDER_SITE_OTHER): Payer: BLUE CROSS/BLUE SHIELD | Admitting: General Surgery

## 2017-10-22 ENCOUNTER — Encounter: Payer: Self-pay | Admitting: General Surgery

## 2017-10-22 VITALS — BP 140/89 | HR 96 | Temp 98.4°F | Ht 65.0 in | Wt 344.0 lb

## 2017-10-22 DIAGNOSIS — M316 Other giant cell arteritis: Secondary | ICD-10-CM | POA: Diagnosis not present

## 2017-10-22 NOTE — H&P (Signed)
Erin [name]; [id number]; 1966/02/09   HPI Patient is a 52 year old black female who was referred to my care by Dr. Merlene Laughter for a temporal artery biopsy.  She has been having left-sided headaches which range from 0-8 in pain, though she currently has 0 pain.  She was started on Decadron.  She is being referred for a temporal artery biopsy to rule out temporal arteritis. Past Medical History:  Diagnosis Date  . Elevated blood-pressure reading without diagnosis of hypertension   . Endometriosis   . Fibroids   . Gestational diabetes   . Graves disease   . Grief at loss of child   . Hypothyroidism   . Menorrhagia   . Premature ventricular contractions    a. rare PVC by monitor 12/2016.  [name] Thyroid disease   . Uterine leiomyoma     Past Surgical History:  Procedure Laterality Date  . APPENDECTOMY    . CESAREAN SECTION    . CHOLECYSTECTOMY    . HERNIA REPAIR    . TUMOR REMOVAL  fibroids    Family History  Problem Relation Age of Onset  . Hypertension Mother   . Cancer Mother   . Hypertension Father   . Diabetes Father   . Cancer Father     Current Outpatient Medications on File Prior to Visit  Medication Sig Dispense Refill  . cholecalciferol (VITAMIN D) 1000 units tablet Take 1,000 Units by mouth daily.    [name] levonorgestrel (MIRENA, 52 MG,) 20 MCG/24HR IUD Mirena 20 mcg/24 hr (5 years) intrauterine device  Take 1 device by intrauterine route.    [name] levothyroxine (SYNTHROID, LEVOTHROID) 137 MCG tablet Take 137 mcg by mouth daily with breakfast.  3  . levothyroxine (SYNTHROID, LEVOTHROID) 137 MCG tablet TAKE 1 TABLET (137 MCG TOTAL) BY MOUTH DAILY BEFORE BREAKFAST. 30 tablet 2  . losartan (COZAAR) 50 MG tablet Take 1 tablet (50 mg total) by mouth daily. 90 tablet 3  . nystatin (MYCOSTATIN) 100000 UNIT/ML suspension Take 5 mLs (500,000 Units total) by mouth 4 (four) times daily for 14 days. 60 mL 0  . pantoprazole (PROTONIX) 40 MG tablet Take 1 tablet (40 mg total) by mouth  daily. Take 30 minutes prior to a meal 90 tablet 3  . predniSONE (DELTASONE) 20 MG tablet Take 3 tablets (60 mg total) by mouth daily with breakfast. 90 tablet 0   No current facility-administered medications on file prior to visit.     Allergies  Allergen Reactions  . Prozac [Fluoxetine Hcl] Nausea Only  . Hydrocodone Hives, Itching and Rash    On thighs     Social History   Substance and Sexual Activity  Alcohol Use No    Social History   Tobacco Use  Smoking Status Never Smoker  Smokeless Tobacco Never Used    Review of Systems  Constitutional: Positive for malaise/fatigue.  HENT: Positive for sore throat.   Eyes: Negative.   Respiratory: Negative.   Cardiovascular: Negative.   Gastrointestinal: Positive for heartburn.  Genitourinary: Negative.   Musculoskeletal: Positive for back pain and joint pain.  Skin: Negative.   Neurological: Positive for headaches.  Endo/Heme/Allergies: Negative.   Psychiatric/Behavioral: Negative.     Objective   Vitals:   10/22/17 1159  BP: 140/89  Pulse: 96  Temp: 98.4 F (36.9 C)    Physical Exam  Constitutional: She is oriented to person, place, and time. She appears well-developed and well-nourished.  HENT:  Head: Normocephalic and atraumatic.  Easily palpable left temporal artery  Cardiovascular: Normal rate, regular rhythm and normal heart sounds.  No murmur heard. Pulmonary/Chest: Effort normal and breath sounds normal. No respiratory distress. She has no wheezes. She has no rales.  Neurological: She is alert and oriented to person, place, and time.  Skin: Skin is warm and dry.  Vitals reviewed.   Assessment  Headaches, rule out temporal arteritis Plan   Patient is scheduled for a left temporal artery biopsy on 10/25/2017.  The risks and benefits of the procedure including bleeding and infection were fully explained to the patient, who gave informed consent.

## 2017-10-22 NOTE — Patient Instructions (Signed)
Temporal Artery Biopsy Arteries are blood vessels that carry blood from the heart to the rest of the body. Temporal arteries are found in your temples, which are on the side of the head and between the ears and eyes. Temporal artery biopsy is a procedure that removes a sample of the temporal artery. The sample is then examined under a microscope. This procedure may be done to check to see if you have a condition that causes your arteries to become swollen or inflamed (temporal arteritis or giant cell arteritis). Tell a health care provider about:  Any allergies you have.  All medicines you are taking, including vitamins, herbs, eye drops, creams, and over-the-counter medicines.  Any problems you or family members have had with anesthetic medicines.  Any blood disorders you have.  Any surgeries you have had.  Any medical conditions you have.  Whether you are pregnant or may be pregnant. What are the risks? Generally, this is a safe procedure. However, problems may occur, including:  Bleeding.  A collection of blood under the skin (hematoma).  Infection.  Nerve damage in the temples. This can cause numbness or make the muscles in your face weak.  Scarring. On the scalp, hair may not grow around the scar.  What happens before the procedure?  You may have blood tests to make sure that your blood clots normally.  Ask your health care provider about: ? Changing or stopping your regular medicines. This is especially important if you are taking diabetes medicines or blood thinners. ? Taking medicines such as aspirin and ibuprofen. These medicines can thin your blood. Do not take these medicines before your procedure if your health care provider instructs you not to.  Follow instructions from your health care provider about eating or drinking restrictions.  Plan to have someone take you home after the procedure.  If you go home right after the procedure, plan to have someone with you  for 24 hours.  Ask your health care provider how your surgical site will be marked or identified.  You may be given antibiotic medicine to help prevent infection. What happens during the procedure?  To reduce your risk of infection: ? Your health care team will wash or sanitize their hands. ? Your skin will be washed with soap.  Small monitors will be put on your body. They will be used to check your heart, blood pressure, and oxygen level.  An IV tube will be inserted into one of your veins.  You will be given one or more of the following: ? A medicine to help you relax (sedative). ? A medicine to numb the area (local anesthetic).  A tool that looks like a pencil and uses sound waves (Doppler) may be used to locate the artery.  A cut (incision) will be made over the temporal artery.  Two clamps will be placed on the artery. Then, a small piece of the artery between the clamps will be cut and removed.  The remaining ends of the artery will be closed securely with stitches (sutures) to avoid bleeding. The clamps will then be removed.  The incision will be closed with sutures.  A bandage (dressing) may be placed on the incision.  The artery piece that was taken out will be checked under a microscope. The procedure may vary among health care providers and hospitals. What happens after the procedure?  Your blood pressure, heart rate, breathing rate, and blood oxygen level will be monitored often until the medicines you were   given have worn off.  You may be given medicine for pain. This information is not intended to replace advice given to you by your health care provider. Make sure you discuss any questions you have with your health care provider. Document Released: 05/30/2009 Document Revised: 02/09/2016 Document Reviewed: 11/29/2014 Elsevier Interactive Patient Education  2018 [employer]  

## 2017-10-22 NOTE — Patient Instructions (Signed)
SHERRONDA SWEIGERT  10/22/2017     @PREFPERIOPPHARMACY @   Your procedure is scheduled on  10/25/2017   Report to [facility] at  810  A.M.  Call this number if you have problems the morning of surgery:  [phone]   Remember:  Do not eat food or drink liquids after midnight.  Take these medicines the morning of surgery with A SIP OF WATER  Levothyroxine, losartan, protonix.   Do not wear jewelry, make-up or nail polish.  Do not wear lotions, powders, or perfumes, or deodorant.  Do not shave 48 hours prior to surgery.  Men may shave face and neck.  Do not bring valuables to the hospital.  [facility] is not responsible for any belongings or valuables.  Contacts, dentures or bridgework may not be worn into surgery.  Leave your suitcase in the car.  After surgery it may be brought to your room.  For patients admitted to the hospital, discharge time will be determined by your treatment team.  Patients discharged the day of surgery will not be allowed to drive home.   Name and phone number of your driver:   family Special instructions:  None  Please read over the following fact sheets that you were given. Anesthesia Post-op Instructions and Care and Recovery After Surgery       Temporal Artery Biopsy Arteries are blood vessels that carry blood from the heart to the rest of the body. Temporal arteries are found in your temples, which are on the side of the head and between the ears and eyes. Temporal artery biopsy is a procedure that removes a sample of the temporal artery. The sample is then examined under a microscope. This procedure may be done to check to see if you have a condition that causes your arteries to become swollen or inflamed (temporal arteritis or giant cell arteritis). Tell a health care provider about:  Any allergies you have.  All medicines you are taking, including vitamins, herbs, eye drops, creams, and over-the-counter medicines.  Any  problems you or family members have had with anesthetic medicines.  Any blood disorders you have.  Any surgeries you have had.  Any medical conditions you have.  Whether you are pregnant or may be pregnant. What are the risks? Generally, this is a safe procedure. However, problems may occur, including:  Bleeding.  A collection of blood under the skin (hematoma).  Infection.  Nerve damage in the temples. This can cause numbness or make the muscles in your face weak.  Scarring. On the scalp, hair may not grow around the scar.  What happens before the procedure?  You may have blood tests to make sure that your blood clots normally.  Ask your health care provider about: ? Changing or stopping your regular medicines. This is especially important if you are taking diabetes medicines or blood thinners. ? Taking medicines such as aspirin and ibuprofen. These medicines can thin your blood. Do not take these medicines before your procedure if your health care provider instructs you not to.  Follow instructions from your health care provider about eating or drinking restrictions.  Plan to have someone take you home after the procedure.  If you go home right after the procedure, plan to have someone with you for 24 hours.  Ask your health care provider how your surgical site will be marked or identified.  You may be given antibiotic medicine to help prevent  infection. What happens during the procedure?  To reduce your risk of infection: ? Your health care team will wash or sanitize their hands. ? Your skin will be washed with soap.  Small monitors will be put on your body. They will be used to check your heart, blood pressure, and oxygen level.  An IV tube will be inserted into one of your veins.  You will be given one or more of the following: ? A medicine to help you relax (sedative). ? A medicine to numb the area (local anesthetic).  A tool that looks like a pencil and  uses sound waves (Doppler) may be used to locate the artery.  A cut (incision) will be made over the temporal artery.  Two clamps will be placed on the artery. Then, a small piece of the artery between the clamps will be cut and removed.  The remaining ends of the artery will be closed securely with stitches (sutures) to avoid bleeding. The clamps will then be removed.  The incision will be closed with sutures.  A bandage (dressing) may be placed on the incision.  The artery piece that was taken out will be checked under a microscope. The procedure may vary among health care providers and hospitals. What happens after the procedure?  Your blood pressure, heart rate, breathing rate, and blood oxygen level will be monitored often until the medicines you were given have worn off.  You may be given medicine for pain. This information is not intended to replace advice given to you by your health care provider. Make sure you discuss any questions you have with your health care provider. Document Released: 05/30/2009 Document Revised: 02/09/2016 Document Reviewed: 11/29/2014 Elsevier Interactive Patient Education  2018 [place].  Temporal Artery Biopsy, Care After Refer to this sheet in the next few weeks. These instructions provide you with information about caring for yourself after your procedure. Your health care provider may also give you more specific instructions. Your treatment has been planned according to current medical practices, but problems sometimes occur. Call your health care provider if you have any problems or questions after your procedure. What can I expect after the procedure? After the procedure, it is common to have:  Soreness.  Bruising.  Numbness.  Swelling.  Follow these instructions at home: Incision care  Follow instructions from your health care provider about how to take care of the cut made during surgery (incision). Make sure you: ? Wash your  hands with soap and water before you change your bandage (dressing). If soap and water are not available, use hand sanitizer. ? Change your dressing as told by your health care provider. ? Leave stitches (sutures), skin glue, or adhesive strips in place. These skin closures may need to stay in place for 2 weeks or longer. If adhesive strip edges start to loosen and curl up, you may trim the loose edges. Do not remove adhesive strips completely unless your health care provider tells you to do that.  Check your incision area every day for signs of infection. Watch for: ? More redness, swelling, or pain. ? More fluid or blood. ? Warmth. ? Pus or a bad smell. Activity  Do not do any physical work or strenuous exercise until your health care provider approves.  Do not lift anything that is heavier than 10 lb (4.5 kg). General instructions   Take over-the-counter and prescription medicines only as told by your health care provider. Do not take aspirin or other NSAIDs  unless told by your health care provider. Aspirin may increase your risk of bleeding at the incision site.  Follow your health care provider's instructions on bathing or showering.  Keep all follow-up visits as told by your health care provider. This is important. Contact a health care provider if:  Medicine does not help your pain.  You have a fever or chills.  You have more redness, swelling, or pain around your incision site.  You have more fluid or blood coming from your incision site.  Your incision feels warm to the touch.  You have pus or a bad smell coming from your incision site.  You have a fever.  You develop nausea or vomiting. Get help right away if:  You have bleeding from the incision that does not stop after 30 minutes of applying heavy pressure.  You have chest pain.  You have shortness of breath.  You faint.  You have sudden vision loss.  You develop weakness or drooping in your face or  eye. This information is not intended to replace advice given to you by your health care provider. Make sure you discuss any questions you have with your health care provider. Document Released: 07/08/2015 Document Revised: 02/09/2016 Document Reviewed: 11/29/2014 Elsevier Interactive Patient Education  2018 [place] Anesthesia is a term that refers to techniques, procedures, and medicines that help a person stay safe and comfortable during a medical procedure. Monitored anesthesia care, or sedation, is one type of anesthesia. Your anesthesia specialist may recommend sedation if you will be having a procedure that does not require you to be unconscious, such as:  Cataract surgery.  A dental procedure.  A biopsy.  A colonoscopy.  During the procedure, you may receive a medicine to help you relax (sedative). There are three levels of sedation:  Mild sedation. At this level, you may feel awake and relaxed. You will be able to follow directions.  Moderate sedation. At this level, you will be sleepy. You may not remember the procedure.  Deep sedation. At this level, you will be asleep. You will not remember the procedure.  The more medicine you are given, the deeper your level of sedation will be. Depending on how you respond to the procedure, the anesthesia specialist may change your level of sedation or the type of anesthesia to fit your needs. An anesthesia specialist will monitor you closely during the procedure. Let your health care provider know about:  Any allergies you have.  All medicines you are taking, including vitamins, herbs, eye drops, creams, and over-the-counter medicines.  Any use of steroids (by mouth or as a cream).  Any problems you or family members have had with sedatives and anesthetic medicines.  Any blood disorders you have.  Any surgeries you have had.  Any medical conditions you have, such as sleep apnea.  Whether you  are pregnant or may be pregnant.  Any use of cigarettes, alcohol, or street drugs. What are the risks? Generally, this is a safe procedure. However, problems may occur, including:  Getting too much medicine (oversedation).  Nausea.  Allergic reaction to medicines.  Trouble breathing. If this happens, a breathing tube may be used to help with breathing. It will be removed when you are awake and breathing on your own.  Heart trouble.  Lung trouble.  Before the procedure Staying hydrated Follow instructions from your health care provider about hydration, which may include:  Up to 2 hours before the procedure - you  may continue to drink clear liquids, such as water, clear fruit juice, black coffee, and plain tea.  Eating and drinking restrictions Follow instructions from your health care provider about eating and drinking, which may include:  8 hours before the procedure - stop eating heavy meals or foods such as meat, fried foods, or fatty foods.  6 hours before the procedure - stop eating light meals or foods, such as toast or cereal.  6 hours before the procedure - stop drinking milk or drinks that contain milk.  2 hours before the procedure - stop drinking clear liquids.  Medicines Ask your health care provider about:  Changing or stopping your regular medicines. This is especially important if you are taking diabetes medicines or blood thinners.  Taking medicines such as aspirin and ibuprofen. These medicines can thin your blood. Do not take these medicines before your procedure if your health care provider instructs you not to.  Tests and exams  You will have a physical exam.  You may have blood tests done to show: ? How well your kidneys and liver are working. ? How well your blood can clot.  General instructions  Plan to have someone take you home from the hospital or clinic.  If you will be going home right after the procedure, plan to have someone with you  for 24 hours.  What happens during the procedure?  Your blood pressure, heart rate, breathing, level of pain and overall condition will be monitored.  An IV tube will be inserted into one of your veins.  Your anesthesia specialist will give you medicines as needed to keep you comfortable during the procedure. This may mean changing the level of sedation.  The procedure will be performed. After the procedure  Your blood pressure, heart rate, breathing rate, and blood oxygen level will be monitored until the medicines you were given have worn off.  Do not drive for 24 hours if you received a sedative.  You may: ? Feel sleepy, clumsy, or nauseous. ? Feel forgetful about what happened after the procedure. ? Have a sore throat if you had a breathing tube during the procedure. ? Vomit. This information is not intended to replace advice given to you by your health care provider. Make sure you discuss any questions you have with your health care provider. Document Released: 03/07/2005 Document Revised: 11/18/2015 Document Reviewed: 10/02/2015 Elsevier Interactive Patient Education  2018 [place], Care After These instructions provide you with information about caring for yourself after your procedure. Your health care provider may also give you more specific instructions. Your treatment has been planned according to current medical practices, but problems sometimes occur. Call your health care provider if you have any problems or questions after your procedure. What can I expect after the procedure? After your procedure, it is common to:  Feel sleepy for several hours.  Feel clumsy and have poor balance for several hours.  Feel forgetful about what happened after the procedure.  Have poor judgment for several hours.  Feel nauseous or vomit.  Have a sore throat if you had a breathing tube during the procedure.  Follow these instructions at home: For  at least 24 hours after the procedure:   Do not: ? Participate in activities in which you could fall or become injured. ? Drive. ? Use heavy machinery. ? Drink alcohol. ? Take sleeping pills or medicines that cause drowsiness. ? Make important decisions or sign legal documents. ? Take care  of children on your own.  Rest. Eating and drinking  Follow the diet that is recommended by your health care provider.  If you vomit, drink water, juice, or soup when you can drink without vomiting.  Make sure you have little or no nausea before eating solid foods. General instructions  Have a responsible adult stay with you until you are awake and alert.  Take over-the-counter and prescription medicines only as told by your health care provider.  If you smoke, do not smoke without supervision.  Keep all follow-up visits as told by your health care provider. This is important. Contact a health care provider if:  You keep feeling nauseous or you keep vomiting.  You feel light-headed.  You develop a rash.  You have a fever. Get help right away if:  You have trouble breathing. This information is not intended to replace advice given to you by your health care provider. Make sure you discuss any questions you have with your health care provider. Document Released: 10/02/2015 Document Revised: 02/01/2016 Document Reviewed: 10/02/2015 Elsevier Interactive Patient Education  [employer].

## 2017-10-22 NOTE — Progress Notes (Signed)
Erin [name]; [id number]; 1966/03/11   HPI Patient is a 52 year old black female who was referred to my care by Dr. Merlene Laughter for a temporal artery biopsy.  She has been having left-sided headaches which range from 0-8 in pain, though she currently has 0 pain.  She was started on Decadron.  She is being referred for a temporal artery biopsy to rule out temporal arteritis. Past Medical History:  Diagnosis Date  . Elevated blood-pressure reading without diagnosis of hypertension   . Endometriosis   . Fibroids   . Gestational diabetes   . Graves disease   . Grief at loss of child   . Hypothyroidism   . Menorrhagia   . Premature ventricular contractions    a. rare PVC by monitor 12/2016.  [name] Thyroid disease   . Uterine leiomyoma     Past Surgical History:  Procedure Laterality Date  . APPENDECTOMY    . CESAREAN SECTION    . CHOLECYSTECTOMY    . HERNIA REPAIR    . TUMOR REMOVAL  fibroids    Family History  Problem Relation Age of Onset  . Hypertension Mother   . Cancer Mother   . Hypertension Father   . Diabetes Father   . Cancer Father     Current Outpatient Medications on File Prior to Visit  Medication Sig Dispense Refill  . cholecalciferol (VITAMIN D) 1000 units tablet Take 1,000 Units by mouth daily.    [name] levonorgestrel (MIRENA, 52 MG,) 20 MCG/24HR IUD Mirena 20 mcg/24 hr (5 years) intrauterine device  Take 1 device by intrauterine route.    [name] levothyroxine (SYNTHROID, LEVOTHROID) 137 MCG tablet Take 137 mcg by mouth daily with breakfast.  3  . levothyroxine (SYNTHROID, LEVOTHROID) 137 MCG tablet TAKE 1 TABLET (137 MCG TOTAL) BY MOUTH DAILY BEFORE BREAKFAST. 30 tablet 2  . losartan (COZAAR) 50 MG tablet Take 1 tablet (50 mg total) by mouth daily. 90 tablet 3  . nystatin (MYCOSTATIN) 100000 UNIT/ML suspension Take 5 mLs (500,000 Units total) by mouth 4 (four) times daily for 14 days. 60 mL 0  . pantoprazole (PROTONIX) 40 MG tablet Take 1 tablet (40 mg total) by mouth  daily. Take 30 minutes prior to a meal 90 tablet 3  . predniSONE (DELTASONE) 20 MG tablet Take 3 tablets (60 mg total) by mouth daily with breakfast. 90 tablet 0   No current facility-administered medications on file prior to visit.     Allergies  Allergen Reactions  . Prozac [Fluoxetine Hcl] Nausea Only  . Hydrocodone Hives, Itching and Rash    On thighs     Social History   Substance and Sexual Activity  Alcohol Use No    Social History   Tobacco Use  Smoking Status Never Smoker  Smokeless Tobacco Never Used    Review of Systems  Constitutional: Positive for malaise/fatigue.  HENT: Positive for sore throat.   Eyes: Negative.   Respiratory: Negative.   Cardiovascular: Negative.   Gastrointestinal: Positive for heartburn.  Genitourinary: Negative.   Musculoskeletal: Positive for back pain and joint pain.  Skin: Negative.   Neurological: Positive for headaches.  Endo/Heme/Allergies: Negative.   Psychiatric/Behavioral: Negative.     Objective   Vitals:   10/22/17 1159  BP: 140/89  Pulse: 96  Temp: 98.4 F (36.9 C)    Physical Exam  Constitutional: She is oriented to person, place, and time. She appears well-developed and well-nourished.  HENT:  Head: Normocephalic and atraumatic.  Easily palpable left temporal artery  Cardiovascular: Normal rate, regular rhythm and normal heart sounds.  No murmur heard. Pulmonary/Chest: Effort normal and breath sounds normal. No respiratory distress. She has no wheezes. She has no rales.  Neurological: She is alert and oriented to person, place, and time.  Skin: Skin is warm and dry.  Vitals reviewed.   Assessment  Headaches, rule out temporal arteritis Plan   Patient is scheduled for a left temporal artery biopsy on 10/25/2017.  The risks and benefits of the procedure including bleeding and infection were fully explained to the patient, who gave informed consent.

## 2017-10-23 ENCOUNTER — Encounter (HOSPITAL_COMMUNITY)
Admission: RE | Admit: 2017-10-23 | Discharge: 2017-10-23 | Disposition: A | Discharge status: Home / Self Care | Payer: BLUE CROSS/BLUE SHIELD | Source: Ambulatory Visit | Attending: General Surgery | Admitting: General Surgery

## 2017-10-23 ENCOUNTER — Other Ambulatory Visit: Payer: Self-pay

## 2017-10-23 ENCOUNTER — Encounter (HOSPITAL_COMMUNITY): Payer: Self-pay

## 2017-10-23 DIAGNOSIS — Z01812 Encounter for preprocedural laboratory examination: Secondary | ICD-10-CM | POA: Insufficient documentation

## 2017-10-23 DIAGNOSIS — Z01810 Encounter for preprocedural cardiovascular examination: Secondary | ICD-10-CM | POA: Diagnosis not present

## 2017-10-23 DIAGNOSIS — R9431 Abnormal electrocardiogram [ECG] [EKG]: Secondary | ICD-10-CM | POA: Insufficient documentation

## 2017-10-23 HISTORY — DX: Major depressive disorder, single episode, unspecified: F32.9

## 2017-10-23 HISTORY — DX: Gastro-esophageal reflux disease without esophagitis: K21.9

## 2017-10-23 HISTORY — DX: Depression, unspecified: F32.A

## 2017-10-23 HISTORY — DX: Type 2 diabetes mellitus without complications: E11.9

## 2017-10-23 HISTORY — DX: Essential (primary) hypertension: I10

## 2017-10-23 HISTORY — DX: Anemia, unspecified: D64.9

## 2017-10-23 LAB — CBC WITH DIFFERENTIAL/PLATELET
Basophils Absolute: 0 10*3/uL (ref 0.0–0.1)
Basophils Relative: 0 %
Eosinophils Absolute: 0 10*3/uL (ref 0.0–0.7)
Eosinophils Relative: 0 %
HCT: 37.5 % (ref 36.0–46.0)
Hemoglobin: 11.8 g/dL — ABNORMAL LOW (ref 12.0–15.0)
Lymphocytes Relative: 11 %
Lymphs Abs: 1.9 10*3/uL (ref 0.7–4.0)
MCH: 26 pg (ref 26.0–34.0)
MCHC: 31.5 g/dL (ref 30.0–36.0)
MCV: 82.8 fL (ref 78.0–100.0)
Monocytes Absolute: 0.3 10*3/uL (ref 0.1–1.0)
Monocytes Relative: 2 %
Neutro Abs: 14.5 10*3/uL (ref 1.7–7.7)
Neutrophils Relative %: 87 %
Platelets: ADEQUATE 10*3/uL (ref 150–400)
RBC: 4.53 MIL/uL (ref 3.87–5.11)
RDW: 15.3 % (ref 11.5–15.5)
WBC: 16.8 10*3/uL — ABNORMAL HIGH (ref 4.0–10.5)

## 2017-10-23 LAB — BASIC METABOLIC PANEL
Anion gap: 14 (ref 5–15)
BUN: 18 mg/dL (ref 6–20)
CO2: 23 mmol/L (ref 22–32)
Calcium: 9.3 mg/dL (ref 8.9–10.3)
Chloride: 98 mmol/L — ABNORMAL LOW (ref 101–111)
Creatinine, Ser: 0.82 mg/dL (ref 0.44–1.00)
GFR calc Af Amer: >60 mL/min (ref >60)
GFR calc non Af Amer: >60 mL/min (ref >60)
Glucose, Bld: 221 mg/dL — ABNORMAL HIGH (ref 65–99)
Potassium: 4.1 mmol/L (ref 3.5–5.1)
Sodium: 135 mmol/L (ref 135–145)

## 2017-10-23 LAB — HCG, SERUM, QUALITATIVE: Preg, Serum: NEGATIVE

## 2017-10-23 NOTE — Progress Notes (Signed)
   10/23/17 1418  OBSTRUCTIVE SLEEP APNEA  Have you ever been diagnosed with sleep apnea through a sleep study? No  Do you snore loudly (loud enough to be heard through closed doors)?  1  Do you often feel tired, fatigued, or sleepy during the daytime (such as falling asleep during driving or talking to someone)? 1  Has anyone observed you stop breathing during your sleep? 1  Do you have, or are you being treated for high blood pressure? 1  BMI more than 35 kg/m2? 1  Age > 50 (1-yes) 1  Neck circumference greater than:Female 16 inches or larger, Female 17inches or larger? 1  Female Gender (Yes=1) 0  Obstructive Sleep Apnea Score 7

## 2017-10-25 ENCOUNTER — Ambulatory Visit (HOSPITAL_COMMUNITY)
Admission: RE | Admit: 2017-10-25 | Discharge: 2017-10-25 | Disposition: A | Discharge status: Home / Self Care | Payer: BLUE CROSS/BLUE SHIELD | Source: Ambulatory Visit | Attending: General Surgery | Admitting: General Surgery

## 2017-10-25 ENCOUNTER — Ambulatory Visit (HOSPITAL_COMMUNITY): Payer: BLUE CROSS/BLUE SHIELD | Admitting: Anesthesiology

## 2017-10-25 ENCOUNTER — Encounter (HOSPITAL_COMMUNITY)
Admission: RE | Disposition: A | Discharge status: Home / Self Care | Payer: Self-pay | Source: Ambulatory Visit | Attending: General Surgery

## 2017-10-25 ENCOUNTER — Encounter (HOSPITAL_COMMUNITY): Payer: Self-pay | Admitting: Anesthesiology

## 2017-10-25 DIAGNOSIS — E039 Hypothyroidism, unspecified: Secondary | ICD-10-CM | POA: Diagnosis not present

## 2017-10-25 DIAGNOSIS — M316 Other giant cell arteritis: Secondary | ICD-10-CM | POA: Diagnosis not present

## 2017-10-25 DIAGNOSIS — Z888 Allergy status to other drugs, medicaments and biological substances status: Secondary | ICD-10-CM | POA: Diagnosis not present

## 2017-10-25 DIAGNOSIS — R51 Headache: Secondary | ICD-10-CM | POA: Diagnosis not present

## 2017-10-25 DIAGNOSIS — Z79899 Other long term (current) drug therapy: Secondary | ICD-10-CM | POA: Diagnosis not present

## 2017-10-25 DIAGNOSIS — Z885 Allergy status to narcotic agent status: Secondary | ICD-10-CM | POA: Insufficient documentation

## 2017-10-25 HISTORY — PX: ARTERY BIOPSY: SHX891

## 2017-10-25 LAB — GLUCOSE, CAPILLARY
Glucose-Capillary: 113 mg/dL — ABNORMAL HIGH (ref 65–99)
Glucose-Capillary: 106 mg/dL — ABNORMAL HIGH (ref 65–99)

## 2017-10-25 SURGERY — BIOPSY TEMPORAL ARTERY
Anesthesia: Monitor Anesthesia Care | Laterality: Left

## 2017-10-25 MED ORDER — HYDROCORTISONE NA SUCCINATE PF 1000 MG IJ SOLR
INTRAMUSCULAR | Status: DC | PRN
  Administered 2017-10-25: 100 mg via INTRAVENOUS

## 2017-10-25 MED ORDER — CHLORHEXIDINE GLUCONATE CLOTH 2 % EX PADS: 6.0000 | MEDICATED_PAD | Freq: Once | CUTANEOUS | Status: DC

## 2017-10-25 MED ORDER — PROPOFOL 10 MG/ML IV BOLUS
INTRAVENOUS | Status: AC
  Filled 2017-10-25: qty 20

## 2017-10-25 MED ORDER — MIDAZOLAM HCL 2 MG/2ML IJ SOLN: 0.5000 mg | Freq: Once | INTRAMUSCULAR | Status: DC | PRN

## 2017-10-25 MED ORDER — HYDROCORTISONE NA SUCCINATE PF 100 MG IJ SOLR
INTRAMUSCULAR | Status: AC
  Filled 2017-10-25: qty 2

## 2017-10-25 MED ORDER — LIDOCAINE HCL (PF) 1 % IJ SOLN
INTRAMUSCULAR | Status: DC | PRN
  Administered 2017-10-25: 1.5 mL

## 2017-10-25 MED ORDER — PROPOFOL 500 MG/50ML IV EMUL
INTRAVENOUS | Status: DC | PRN
  Administered 2017-10-25: 11:00:00 via INTRAVENOUS
  Administered 2017-10-25: 100 ug/kg/min via INTRAVENOUS
  Administered 2017-10-25: 70 ug/kg/min via INTRAVENOUS

## 2017-10-25 MED ORDER — ACETAMINOPHEN 10 MG/ML IV SOLN: 1000.0000 mg | Freq: Once | INTRAVENOUS | Status: DC | PRN

## 2017-10-25 MED ORDER — PROPOFOL 10 MG/ML IV BOLUS
INTRAVENOUS | Status: DC | PRN
  Administered 2017-10-25 (×7): 20 mg via INTRAVENOUS

## 2017-10-25 MED ORDER — LACTATED RINGERS IV SOLN
INTRAVENOUS | Status: DC
  Administered 2017-10-25: 10:00:00 via INTRAVENOUS

## 2017-10-25 MED ORDER — 0.9 % SODIUM CHLORIDE (POUR BTL) OPTIME
TOPICAL | Status: DC | PRN
  Administered 2017-10-25: 1000 mL

## 2017-10-25 MED ORDER — LIDOCAINE HCL (PF) 1 % IJ SOLN
INTRAMUSCULAR | Status: AC
  Filled 2017-10-25: qty 30

## 2017-10-25 MED ORDER — HYDROMORPHONE HCL 1 MG/ML IJ SOLN
0.2500 mg | INTRAMUSCULAR | Status: DC | PRN
  Administered 2017-10-25: 0.5 mg via INTRAVENOUS
  Filled 2017-10-25: qty 0.5

## 2017-10-25 MED ORDER — PROMETHAZINE HCL 25 MG/ML IJ SOLN: 6.2500 mg | INTRAMUSCULAR | Status: DC | PRN

## 2017-10-25 SURGICAL SUPPLY — 41 items
ADH SKN CLS APL DERMABOND .7 (GAUZE/BANDAGES/DRESSINGS) ×1
BALL CTTN STRL GZE (GAUZE/BANDAGES/DRESSINGS) ×1
BLADE SURG 15 STRL LF DISP TIS (BLADE) ×2 IMPLANT
BLADE SURG 15 STRL SS (BLADE) ×4
CLOTH BEACON ORANGE TIMEOUT ST (SAFETY) ×2 IMPLANT
COTTON BALL STERILE (GAUZE/BANDAGES/DRESSINGS) ×2
COTTON BALL STERILE 2 PK (GAUZE/BANDAGES/DRESSINGS) ×1 IMPLANT
COVER LIGHT HANDLE STERIS (MISCELLANEOUS) ×4 IMPLANT
DECANTER SPIKE VIAL GLASS SM (MISCELLANEOUS) ×2 IMPLANT
DERMABOND ADVANCED (GAUZE/BANDAGES/DRESSINGS) ×1
DERMABOND ADVANCED .7 DNX12 (GAUZE/BANDAGES/DRESSINGS) ×1 IMPLANT
DRAPE EENT ADH APERT 31X51 STR (DRAPES) ×2 IMPLANT
ELECT NDL TIP 2.8 STRL (NEEDLE) ×1 IMPLANT
ELECT NEEDLE TIP 2.8 STRL (NEEDLE) ×2 IMPLANT
ELECT REM PT RETURN 9FT ADLT (ELECTROSURGICAL) ×2
ELECTRODE REM PT RTRN 9FT ADLT (ELECTROSURGICAL) ×1 IMPLANT
GAUZE SPONGE 4X4 16PLY XRAY LF (GAUZE/BANDAGES/DRESSINGS) ×2 IMPLANT
GLOVE BIOGEL M 6.5 STRL (GLOVE) ×1 IMPLANT
GLOVE BIOGEL PI IND STRL 6.5 (GLOVE) IMPLANT
GLOVE BIOGEL PI IND STRL 7.0 (GLOVE) ×1 IMPLANT
GLOVE BIOGEL PI IND STRL 7.5 (GLOVE) ×1 IMPLANT
GLOVE BIOGEL PI INDICATOR 6.5 (GLOVE) ×1
GLOVE BIOGEL PI INDICATOR 7.0 (GLOVE) ×1
GLOVE BIOGEL PI INDICATOR 7.5 (GLOVE) ×2
GLOVE ECLIPSE 7.0 STRL STRAW (GLOVE) ×2 IMPLANT
GOWN STRL REUS W/TWL LRG LVL3 (GOWN DISPOSABLE) ×4 IMPLANT
KIT BLADEGUARD II DBL (SET/KITS/TRAYS/PACK) ×2 IMPLANT
MANIFOLD NEPTUNE II (INSTRUMENTS) ×2 IMPLANT
MARKER SKIN DUAL TIP RULER LAB (MISCELLANEOUS) ×2 IMPLANT
NDL HYPO 25X5/8 SAFETYGLIDE (NEEDLE) ×1 IMPLANT
NEEDLE HYPO 25X5/8 SAFETYGLIDE (NEEDLE) ×2 IMPLANT
NS IRRIG 1000ML POUR BTL (IV SOLUTION) ×2 IMPLANT
PACK BASIC III (CUSTOM PROCEDURE TRAY) ×2
PACK SRG BSC III STRL LF ECLPS (CUSTOM PROCEDURE TRAY) ×1 IMPLANT
PAD ARMBOARD 7.5X6 YLW CONV (MISCELLANEOUS) ×2 IMPLANT
PAD TELFA 3X4 1S STER (GAUZE/BANDAGES/DRESSINGS) ×2 IMPLANT
PENCIL HANDSWITCHING (ELECTRODE) ×2 IMPLANT
SUT VIC AB 5-0 P-3 18X BRD (SUTURE) ×1 IMPLANT
SUT VIC AB 5-0 P3 18 (SUTURE) ×4
SUT VICRYL AB 3 0 TIES (SUTURE) ×2 IMPLANT
SYR CONTROL 10ML LL (SYRINGE) ×2 IMPLANT

## 2017-10-25 NOTE — Anesthesia Postprocedure Evaluation (Signed)
Anesthesia Post Note  Patient: Erin [name]  Procedure(s) Performed: BIOPSY TEMPORAL ARTERY (Left )  Patient location during evaluation: PACU Anesthesia Type: MAC Level of consciousness: awake and alert Pain management: pain level controlled Vital Signs Assessment: post-procedure vital signs reviewed and stable Respiratory status: spontaneous breathing, nonlabored ventilation, respiratory function stable and patient connected to nasal cannula oxygen Cardiovascular status: stable and blood pressure returned to baseline Postop Assessment: no apparent nausea or vomiting Anesthetic complications: no     Last Vitals:  Vitals:   10/25/17 1130 10/25/17 1144  BP: 133/76 (!) 156/88  Pulse: 79 82  Resp: 16   Temp:  36.9 C  SpO2: 96% 96%    Last Pain:  Vitals:   10/25/17 1144  TempSrc: Oral  PainSc: 0-No pain                 Benay Pike

## 2017-10-25 NOTE — Op Note (Signed)
Patient:  Erin [name]  DOB:  [birth year]  MRN:  [id number]   Preop Diagnosis: Headaches, rule out temporal arteritis  Postop Diagnosis: Same  Procedure: Left temporal artery biopsy  Surgeon: Aviva Signs, MD  Anes: MAC  Indications: Patient is a 52 year old black female who was referred to my care for left temporal artery biopsy.  The risks and benefits of the procedure including bleeding and infection were fully explained to the patient, who gave informed consent.  Procedure note: The patient was placed in supine position.  After monitored anesthesia care was given, the left temporal region was prepped and draped using the usual sterile technique with Betadine.  Surgical site confirmation was performed.  1% Xylocaine was used for local anesthesia.  Using a Doppler, the left temporal artery was identified just medial to the inner tragus.  An incision was made and the artery was followed for approximately 2 cm.  4-0 Vicryl ties were placed proximally and distally on the artery and the artery was biopsied and sent to pathology for further examination.  The subcutaneous layer was reapproximated using a 5-0 Vicryl subcuticular suture.  Dermabond was applied.  All tape and needle counts were correct at the end of the procedure.  Patient was awakened and transferred to PACU in stable condition.  Complications: None  EBL: Minimal  Specimen: Left temporal artery

## 2017-10-25 NOTE — Anesthesia Procedure Notes (Signed)
Procedure Name: MAC Date/Time: 10/25/2017 10:17 AM Performed by: Vista Deck, CRNA Pre-anesthesia Checklist: Patient identified, Emergency Drugs available, Suction available, Timeout performed and Patient being monitored Patient Re-evaluated:Patient Re-evaluated prior to induction Oxygen Delivery Method: Nasal Cannula

## 2017-10-25 NOTE — Anesthesia Postprocedure Evaluation (Signed)
Anesthesia Post Note  Patient: Erin [name]  Procedure(s) Performed: BIOPSY TEMPORAL ARTERY (Left )  Patient location during evaluation: Short Stay Anesthesia Type: MAC Level of consciousness: awake and alert and patient cooperative Pain management: satisfactory to patient Vital Signs Assessment: post-procedure vital signs reviewed and stable Respiratory status: spontaneous breathing Cardiovascular status: stable Postop Assessment: no apparent nausea or vomiting Anesthetic complications: no     Last Vitals:  Vitals:   10/25/17 1130 10/25/17 1144  BP: 133/76 (!) 156/88  Pulse: 79 82  Resp: 16   Temp:  36.9 C  SpO2: 96% 96%    Last Pain:  Vitals:   10/25/17 1144  TempSrc: Oral  PainSc: 0-No pain                 Abyan Cadman

## 2017-10-25 NOTE — Discharge Instructions (Signed)
Temporal Artery Biopsy, Care After Refer to this sheet in the next few weeks. These instructions provide you with information about caring for yourself after your procedure. Your health care provider may also give you more specific instructions. Your treatment has been planned according to current medical practices, but problems sometimes occur. Call your health care provider if you have any problems or questions after your procedure. What can I expect after the procedure? After the procedure, it is common to have:  Soreness.  Bruising.  Numbness.  Swelling.  Follow these instructions at home: Incision care  Follow instructions from your health care provider about how to take care of the cut made during surgery (incision). Make sure you: ? Wash your hands with soap and water before you change your bandage (dressing). If soap and water are not available, use hand sanitizer. ? Change your dressing as told by your health care provider. ? Leave stitches (sutures), skin glue, or adhesive strips in place. These skin closures may need to stay in place for 2 weeks or longer. If adhesive strip edges start to loosen and curl up, you may trim the loose edges. Do not remove adhesive strips completely unless your health care provider tells you to do that.  Check your incision area every day for signs of infection. Watch for: ? More redness, swelling, or pain. ? More fluid or blood. ? Warmth. ? Pus or a bad smell. Activity  Do not do any physical work or strenuous exercise until your health care provider approves.  Do not lift anything that is heavier than 10 lb (4.5 kg). General instructions   Take over-the-counter and prescription medicines only as told by your health care provider. Do not take aspirin or other NSAIDs unless told by your health care provider. Aspirin may increase your risk of bleeding at the incision site.  Follow your health care provider's instructions on bathing or  showering.  Keep all follow-up visits as told by your health care provider. This is important. Contact a health care provider if:  Medicine does not help your pain.  You have a fever or chills.  You have more redness, swelling, or pain around your incision site.  You have more fluid or blood coming from your incision site.  Your incision feels warm to the touch.  You have pus or a bad smell coming from your incision site.  You have a fever.  You develop nausea or vomiting. Get help right away if:  You have bleeding from the incision that does not stop after 30 minutes of applying heavy pressure.  You have chest pain.  You have shortness of breath.  You faint.  You have sudden vision loss.  You develop weakness or drooping in your face or eye. This information is not intended to replace advice given to you by your health care provider. Make sure you discuss any questions you have with your health care provider. Document Released: 07/08/2015 Document Revised: 02/09/2016 Document Reviewed: 11/29/2014 Elsevier Interactive Patient Education  2018 [place] POST-ANESTHESIA  IMMEDIATELY FOLLOWING SURGERY:  Do not drive or operate machinery for the first twenty four hours after surgery.  Do not make any important decisions for twenty four hours after surgery or while taking narcotic pain medications or sedatives.  If you develop intractable nausea and vomiting or a severe headache please notify your doctor immediately.  FOLLOW-UP:  Please make an appointment with your surgeon as instructed. You do not need to  follow up with anesthesia unless specifically instructed to do so.  WOUND CARE INSTRUCTIONS (if applicable):  Keep a dry clean dressing on the anesthesia/puncture wound site if there is drainage.  Once the wound has quit draining you may leave it open to air.  Generally you should leave the bandage intact for twenty four hours unless there is  drainage.  If the epidural site drains for more than 36-48 hours please call the anesthesia department.  QUESTIONS?:  Please feel free to call your physician or the hospital operator if you have any questions, and they will be happy to assist you.

## 2017-10-25 NOTE — Anesthesia Preprocedure Evaluation (Signed)
Anesthesia Evaluation  Patient identified by MRN, date of birth, ID band Patient awake    Reviewed: Allergy & Precautions, H&P , NPO status , Patient's Chart, lab work & pertinent test results, reviewed documented beta blocker date and time   Airway Mallampati: II  TM Distance: >3 FB Neck ROM: full    Dental no notable dental hx. (+) Teeth Intact, Chipped   Pulmonary neg pulmonary ROS,    Pulmonary exam normal breath sounds clear to auscultation       Cardiovascular Exercise Tolerance: Good hypertension, negative cardio ROS   Rhythm:regular Rate:Normal     Neuro/Psych negative neurological ROS  negative psych ROS   GI/Hepatic Neg liver ROS,   Endo/Other  negative endocrine ROSdiabetes  Renal/GU negative Renal ROS  negative genitourinary   Musculoskeletal   Abdominal   Peds  Hematology negative hematology ROS (+)   Anesthesia Other Findings Morbid obesity No clinical complaints EKG... NSR with no acute changes  Reproductive/Obstetrics negative OB ROS                             Anesthesia Physical Anesthesia Plan  ASA: III  Anesthesia Plan: MAC   Post-op Pain Management:    Induction:   PONV Risk Score and Plan:   Airway Management Planned:   Additional Equipment:   Intra-op Plan:   Post-operative Plan:   Informed Consent: I have reviewed the patients History and Physical, chart, labs and discussed the procedure including the risks, benefits and alternatives for the proposed anesthesia with the patient or authorized representative who has indicated his/her understanding and acceptance.   Dental Advisory Given  Plan Discussed with: CRNA  Anesthesia Plan Comments:         Anesthesia Quick Evaluation

## 2017-10-25 NOTE — Interval H&P Note (Signed)
History and Physical Interval Note:  10/25/2017 9:24 AM  Erin [name]  has presented today for surgery, with the diagnosis of temporal arteritis  The various methods of treatment have been discussed with the patient and family. After consideration of risks, benefits and other options for treatment, the patient has consented to  Procedure(s): BIOPSY TEMPORAL ARTERY (Left) as a surgical intervention .  The patient's history has been reviewed, patient examined, no change in status, stable for surgery.  I have reviewed the patient's chart and labs.  Questions were answered to the patient's satisfaction.     Aviva Signs

## 2017-10-25 NOTE — Transfer of Care (Signed)
Immediate Anesthesia Transfer of Care Note  Patient: Erin [name]  Procedure(s) Performed: BIOPSY TEMPORAL ARTERY (Left )  Patient Location: PACU  Anesthesia Type:MAC  Level of Consciousness: awake, alert  and patient cooperative  Airway & Oxygen Therapy: Patient Spontanous Breathing  Post-op Assessment: Report given to RN and Post -op Vital signs reviewed and stable  Post vital signs: Reviewed and stable  Last Vitals:  Vitals Value Taken Time  BP    Temp    Pulse    Resp    SpO2      Last Pain: There were no vitals filed for this visit.       Complications: No apparent anesthesia complications

## 2017-10-28 ENCOUNTER — Encounter (HOSPITAL_COMMUNITY): Payer: Self-pay | Admitting: General Surgery

## 2017-10-29 ENCOUNTER — Ambulatory Visit (INDEPENDENT_AMBULATORY_CARE_PROVIDER_SITE_OTHER): Payer: Self-pay | Admitting: General Surgery

## 2017-10-29 ENCOUNTER — Encounter: Payer: Self-pay | Admitting: General Surgery

## 2017-10-29 VITALS — BP 152/97 | HR 90 | Temp 98.6°F | Resp 22 | Ht 65.0 in | Wt 346.0 lb

## 2017-10-29 DIAGNOSIS — Z09 Encounter for follow-up examination after completed treatment for conditions other than malignant neoplasm: Secondary | ICD-10-CM

## 2017-10-29 NOTE — Progress Notes (Signed)
Subjective:     Erin [name]  Status post left temporal artery biopsy.  Patient doing well.  Has no significant complaints of incisional pain. Objective:    BP (!) 152/97 (BP Location: Left Arm, Patient Position: Sitting, Cuff Size: Large)   Pulse 90   Temp 98.6 F (37 C)   Resp (!) 22   Ht 5\' 5"  (1.651 m)   Wt (!) 346 lb (156.9 kg)   BMI 57.58 kg/m   General:  alert, cooperative and no distress  Incision healing well. Final pathology negative for arteritis     Assessment:    Doing well postoperatively.    Plan:   Patient will follow-up with Dr. Merlene Laughter.  Follow-up here as needed.

## 2017-10-30 ENCOUNTER — Other Ambulatory Visit: Payer: Self-pay | Admitting: Internal Medicine

## 2017-11-01 ENCOUNTER — Other Ambulatory Visit: Payer: Self-pay | Admitting: Internal Medicine

## 2017-11-03 NOTE — Telephone Encounter (Addendum)
rx sent  Has she seen neurology?  Is she taking 50 mg of the prednisone daily or 40 mg?

## 2017-11-04 ENCOUNTER — Encounter: Payer: Self-pay | Admitting: Internal Medicine

## 2017-11-04 NOTE — Telephone Encounter (Signed)
Spoke with pt. [facility] Neurology called in Prednisone 10 mg 4 tablets daily. Advised we would cancel 20 mg RX. Contacted [facility] and canceled RX.

## 2017-11-05 DIAGNOSIS — E039 Hypothyroidism, unspecified: Secondary | ICD-10-CM | POA: Diagnosis not present

## 2017-11-05 DIAGNOSIS — G4733 Obstructive sleep apnea (adult) (pediatric): Secondary | ICD-10-CM | POA: Diagnosis not present

## 2017-11-05 DIAGNOSIS — M316 Other giant cell arteritis: Secondary | ICD-10-CM | POA: Diagnosis not present

## 2017-11-05 DIAGNOSIS — I10 Essential (primary) hypertension: Secondary | ICD-10-CM | POA: Diagnosis not present

## 2017-11-08 ENCOUNTER — Ambulatory Visit: Payer: BLUE CROSS/BLUE SHIELD | Admitting: Internal Medicine

## 2017-11-08 ENCOUNTER — Encounter: Payer: Self-pay | Admitting: Internal Medicine

## 2017-11-08 VITALS — BP 126/78 | HR 116 | Temp 98.2°F | Ht 65.0 in | Wt 342.0 lb

## 2017-11-08 DIAGNOSIS — B370 Candidal stomatitis: Secondary | ICD-10-CM | POA: Diagnosis not present

## 2017-11-08 DIAGNOSIS — M316 Other giant cell arteritis: Secondary | ICD-10-CM | POA: Diagnosis not present

## 2017-11-08 DIAGNOSIS — R51 Headache: Secondary | ICD-10-CM

## 2017-11-08 DIAGNOSIS — R519 Headache, unspecified: Secondary | ICD-10-CM

## 2017-11-08 MED ORDER — DOXYCYCLINE HYCLATE 100 MG PO TABS: 100.0000 mg | ORAL_TABLET | Freq: Two times a day (BID) | ORAL | 0 refills | Status: DC

## 2017-11-08 NOTE — Patient Instructions (Signed)
Please take all new medication as prescribed - the antibiotic  Please continue all other medications as before, and refills have been done if requested.  Please have the pharmacy call with any other refills you may need.  Please keep your appointments with your specialists as you may have planned   

## 2017-11-08 NOTE — Progress Notes (Signed)
Subjective:    Patient ID: Erin [name], female    DOB: Jun 11, 1966, 52 y.o.   MRN: [id number]  HPI  Here to f/u s/p recent left temporal biopsy, called general surgury and directed here, remains on prednisone for abnormal biopsy.  Has onset diffuse left facial swelling and discomfort x 3 days, mild to mod, constant, tender to touch, no skin erythema or drainage, nothing else makes better or worse. Recent hosp exposure, MRSA not checked that I can tell per EMR,  Pt has hx of exposure to special needs daughter with MRSA prior to recent death.  Also with tongue discomfort and rash with taking the prednisone, worse in last 3 days as well.  No other ear pain, HA, sinus congestion, dental pain, ST, cough or neck pain.   Past Medical History:  Diagnosis Date  . Anemia   . Depression   . Diabetes mellitus without complication (HCC)    gestational  . Elevated blood-pressure reading without diagnosis of hypertension   . Endometriosis   . Fibroids   . GERD (gastroesophageal reflux disease)   . Graves disease   . Grief at loss of child   . Hypertension   . Hypothyroidism   . Menorrhagia   . Premature ventricular contractions    a. rare PVC by monitor 12/2016.  [name] Thyroid disease   . Uterine leiomyoma    Past Surgical History:  Procedure Laterality Date  . APPENDECTOMY    . ARTERY BIOPSY Left 10/25/2017   Procedure: BIOPSY TEMPORAL ARTERY;  Surgeon: Aviva Signs, MD;  Location: [facility];  Service: General;  Laterality: Left;  . CESAREAN SECTION    . CHOLECYSTECTOMY    . HERNIA REPAIR     incisional  . TUMOR REMOVAL  fibroids    reports that she has never smoked. She has never used smokeless tobacco. She reports that she does not drink alcohol or use drugs. family history includes Cancer in her father and mother; Diabetes in her father; Hypertension in her father and mother. Allergies  Allergen Reactions  . Prozac [Fluoxetine Hcl] Nausea Only  . Hydrocodone Hives, Itching and Rash    On  thighs    Current Outpatient Medications on File Prior to Visit  Medication Sig Dispense Refill  . cholecalciferol (VITAMIN D) 1000 units tablet Take 1,000 Units by mouth daily.    [name] levonorgestrel (MIRENA, 52 MG,) 20 MCG/24HR IUD Mirena 20 mcg/24 hr (5 years) intrauterine device  Take 1 device by intrauterine route.    [name] levothyroxine (SYNTHROID, LEVOTHROID) 137 MCG tablet Take 137 mcg by mouth daily with breakfast.  3  . levothyroxine (SYNTHROID, LEVOTHROID) 137 MCG tablet TAKE 1 TABLET (137 MCG TOTAL) BY MOUTH DAILY BEFORE BREAKFAST. 30 tablet 2  . losartan (COZAAR) 50 MG tablet TAKE 1 TABLET BY MOUTH EVERY DAY 90 tablet 0  . pantoprazole (PROTONIX) 40 MG tablet Take 1 tablet (40 mg total) by mouth daily. Take 30 minutes prior to a meal 90 tablet 3  . predniSONE (DELTASONE) 10 MG tablet Take 40 mg by mouth daily with breakfast.     No current facility-administered medications on file prior to visit.    Review of Systems VS noted,  Constitutional: Pt appears in NAD HENT: Head: NCAT.  Right Ear: External ear normal.  Left Ear: External ear normal.  Eyes: . Pupils are equal, round, and reactive to light. Conjunctivae and EOM are normal Nose: without d/c or deformity Neck: Neck supple. Gross normal ROM Cardiovascular: Normal  rate and regular rhythm.   Pulmonary/Chest: Effort normal and breath sounds without rales or wheezing.  Abd:  Soft, NT, ND, + BS, no organomegaly Neurological: Pt is alert. At baseline orientation, motor grossly intact Skin: Skin is warm. No rashes, other new lesions, no LE edema Psychiatric: Pt behavior is normal without agitation  No other exam findings    Objective:   Physical Exam BP 126/78   Pulse (!) 116   Temp 98.2 F (36.8 C) (Oral)   Ht 5\' 5"  (1.651 m)   Wt (!) 342 lb (155.1 kg)   SpO2 97%   BMI 56.91 kg/m  VS noted, not ill appearing Constitutional: Pt appears in NAD HENT: Head: NCAT. Has nondiscrete tender swelling without skin erythema to  the left preauricular and left face and somewhat to left submandibular area without lymphadenopathy  Bilat TM's clear, sinus nontender, no dental swelling or tenderness, no pharyngeal erythema, but tongue with dorsal whitish rash Right Ear: External ear normal.  Left Ear: External ear normal.  Eyes: . Pupils are equal, round, and reactive to light. Conjunctivae and EOM are normal Nose: without d/c or deformity Neck: Neck supple. Gross normal ROM Cardiovascular: Normal rate and regular rhythm.   Pulmonary/Chest: Effort normal and breath sounds without rales or wheezing.  Neurological: Pt is alert. At baseline orientation, motor grossly intact Skin: Skin is warm. No rashes, other new lesions, no LE edema, left temporal biopsy site intact without specific erythema, swelling tender or drainage Psychiatric: Pt behavior is normal without agitation  No other exam findings    Assessment & Plan:

## 2017-11-09 DIAGNOSIS — R51 Headache: Principal | ICD-10-CM

## 2017-11-09 DIAGNOSIS — B370 Candidal stomatitis: Secondary | ICD-10-CM | POA: Insufficient documentation

## 2017-11-09 DIAGNOSIS — R519 Headache, unspecified: Secondary | ICD-10-CM | POA: Insufficient documentation

## 2017-11-09 NOTE — Assessment & Plan Note (Addendum)
Mild to mod, for mycelex troche asd,  to f/u any worsening symptoms or concerns

## 2017-11-09 NOTE — Assessment & Plan Note (Signed)
To cont prednisone tx,  to f/u any worsening symptoms or concerns

## 2017-11-09 NOTE — Assessment & Plan Note (Signed)
With subq swelling to nondiscrete but relatively large area of suspected underlying fascial layer left face infection post op, with hx of possible MRSA exposure in past, pt nontoxic, will add doxycycline asd,  to f/u any worsening symptoms or concerns

## 2017-11-13 ENCOUNTER — Encounter: Payer: Self-pay | Admitting: Internal Medicine

## 2017-11-13 DIAGNOSIS — M316 Other giant cell arteritis: Secondary | ICD-10-CM

## 2017-11-20 ENCOUNTER — Encounter: Payer: Self-pay | Admitting: Internal Medicine

## 2017-11-25 ENCOUNTER — Encounter: Payer: Self-pay | Admitting: Endocrinology

## 2017-11-25 ENCOUNTER — Encounter: Payer: Self-pay | Admitting: Internal Medicine

## 2017-11-25 ENCOUNTER — Telehealth: Payer: Self-pay | Admitting: Endocrinology

## 2017-11-25 DIAGNOSIS — E890 Postprocedural hypothyroidism: Secondary | ICD-10-CM

## 2017-11-25 DIAGNOSIS — R51 Headache: Secondary | ICD-10-CM | POA: Diagnosis not present

## 2017-11-25 DIAGNOSIS — R700 Elevated erythrocyte sedimentation rate: Secondary | ICD-10-CM | POA: Diagnosis not present

## 2017-11-25 DIAGNOSIS — B370 Candidal stomatitis: Secondary | ICD-10-CM | POA: Diagnosis not present

## 2017-11-25 NOTE — Telephone Encounter (Signed)
I have replied to her and told her it is not related to her thyroid, she needs to talk to her PCP

## 2017-11-25 NOTE — Telephone Encounter (Signed)
Please advise 

## 2017-11-25 NOTE — Telephone Encounter (Signed)
Patient stated that she is having trouble getting her body temp up on the left side, she stated that she my chated you last [phone]

## 2017-11-27 ENCOUNTER — Other Ambulatory Visit (INDEPENDENT_AMBULATORY_CARE_PROVIDER_SITE_OTHER): Payer: BLUE CROSS/BLUE SHIELD

## 2017-11-27 ENCOUNTER — Other Ambulatory Visit: Payer: BLUE CROSS/BLUE SHIELD

## 2017-11-27 DIAGNOSIS — E890 Postprocedural hypothyroidism: Secondary | ICD-10-CM | POA: Diagnosis not present

## 2017-11-27 LAB — TSH: TSH: 0.14 u[IU]/mL — ABNORMAL LOW (ref 0.35–4.50)

## 2017-11-27 LAB — T4, FREE: Free T4: 1.14 ng/dL (ref 0.60–1.60)

## 2017-11-29 ENCOUNTER — Ambulatory Visit: Payer: BLUE CROSS/BLUE SHIELD | Admitting: Endocrinology

## 2017-12-03 ENCOUNTER — Encounter: Payer: Self-pay | Admitting: Internal Medicine

## 2017-12-04 ENCOUNTER — Encounter: Payer: Self-pay | Admitting: Endocrinology

## 2017-12-04 ENCOUNTER — Ambulatory Visit (INDEPENDENT_AMBULATORY_CARE_PROVIDER_SITE_OTHER): Payer: BLUE CROSS/BLUE SHIELD | Admitting: Endocrinology

## 2017-12-04 VITALS — BP 132/80 | HR 115 | Ht 65.0 in | Wt 351.6 lb

## 2017-12-04 DIAGNOSIS — E890 Postprocedural hypothyroidism: Secondary | ICD-10-CM

## 2017-12-04 MED ORDER — UNITHROID 125 MCG PO TABS: 125.0000 ug | ORAL_TABLET | Freq: Every day | ORAL | 3 refills | Status: DC

## 2017-12-04 NOTE — Progress Notes (Signed)
Erin [name] 52 y.o.   Reason for Appointment:  Hypothyroidism, followup visit    History of Present Illness:   The hypothyroidism was first diagnosed  after treatment of her Graves' disease, had I-131 treatment in 08/2008  Her dose had been adjusted previously and she had been taking a lower dose of 50 g for some time with stable levels However in 6/15 her TSH was significantly high at 8.47 despite being compliant with her medication. With increasing her dose to 75 g she did not seem to have any significant change in her energy level  Although her TSH was normal in 01/2014 it was significantly higher in 2/16 Her doses have been adjusted fairly regularly since 2015  Subsequently with 112 g her TSH had been fluctuating somewhat On her visit in 9/17 her TSH was unusually high at 10.5, she was having nonspecific fatigue Subsequently did not follow-up as scheduled  Although she was switched to the 137 g since then she did not feel any different with the dosage change Subsequently 1/19 TSH was unusually high at 18.5  She was not having any unusual fatigue at that time or weight change However it appears that at that time she was taking her  levothyroxine dose in the afternoon with a snack instead of taking it before breakfast, she was not wanting to combine it with losartan that was new  RECENT history:  Her dose was changed back to 137 mcg in 1/19 and she was also asked to take her levothyroxine before breakfast She has also been switched to UNITHROID brand name which is not excessively expensive for her  Since TSH was surprisingly low in 3/19 she was advised to reduce her dose to 6-1/2 tablets a week of the 137 mcg Also she was supposed to switch to the 125 instead of 137 but she did not call for new prescription  Now her TSH is still relatively low although not as suppressed  Again she does not complain of any shakiness or palpitations However she has had some  difficulty with insomnia and jitteriness with taking large doses of prednisone  She is not taking Unithroid brand and this is not too expensive Again she is very regular with taking her supplement before breakfast daily  Lab Results  Component Value Date   TSH 0.14 (L) 11/27/2017   TSH 0.09 (L) 08/27/2017   TSH 18.55 (H) 06/27/2017   FREET4 1.14 11/27/2017   FREET4 1.11 08/27/2017   FREET4 0.67 06/27/2017    Wt Readings from Last 3 Encounters:  12/04/17 (!) 351 lb 9.6 oz (159.5 kg)  11/08/17 (!) 342 lb (155.1 kg)  10/29/17 (!) 346 lb (156.9 kg)     Allergies as of 12/04/2017      Reactions   Prozac [fluoxetine Hcl] Nausea Only   Hydrocodone Hives, Itching, Rash   On thighs      Medication List        Accurate as of 12/04/17 11:59 PM. Always use your most recent med list.          cholecalciferol 1000 units tablet Commonly known as:  VITAMIN D Take 1,000 Units by mouth daily.   losartan 50 MG tablet Commonly known as:  COZAAR TAKE 1 TABLET BY MOUTH EVERY DAY   MIRENA (52 MG) 20 MCG/24HR IUD Generic drug:  levonorgestrel Mirena 20 mcg/24 hr (5 years) intrauterine device  Take 1 device by intrauterine route.   pantoprazole 40 MG tablet Commonly known as:  PROTONIX  Take 1 tablet (40 mg total) by mouth daily. Take 30 minutes prior to a meal   predniSONE 10 MG tablet Commonly known as:  DELTASONE Take 60 mg by mouth daily with breakfast.   UNITHROID 125 MCG tablet Generic drug:  levothyroxine Take 1 tablet (125 mcg total) by mouth daily before breakfast.       Allergies:  Allergies  Allergen Reactions  . Prozac [Fluoxetine Hcl] Nausea Only  . Hydrocodone Hives, Itching and Rash    On thighs     Past Medical History:  Diagnosis Date  . Anemia   . Depression   . Diabetes mellitus without complication (HCC)    gestational  . Elevated blood-pressure reading without diagnosis of hypertension   . Endometriosis   . Fibroids   . GERD  (gastroesophageal reflux disease)   . Graves disease   . Grief at loss of child   . Hypertension   . Hypothyroidism   . Menorrhagia   . Premature ventricular contractions    a. rare PVC by monitor 12/2016.  [name] Thyroid disease   . Uterine leiomyoma     Past Surgical History:  Procedure Laterality Date  . APPENDECTOMY    . ARTERY BIOPSY Left 10/25/2017   Procedure: BIOPSY TEMPORAL ARTERY;  Surgeon: Aviva Signs, MD;  Location: [facility];  Service: General;  Laterality: Left;  . CESAREAN SECTION    . CHOLECYSTECTOMY    . HERNIA REPAIR     incisional  . TUMOR REMOVAL  fibroids    Family History  Problem Relation Age of Onset  . Hypertension Mother   . Cancer Mother   . Hypertension Father   . Diabetes Father   . Cancer Father     Social History:  reports that she has never smoked. She has never used smokeless tobacco. She reports that she does not drink alcohol or use drugs.  REVIEW Of SYSTEMS:   Hypertension treated by PCP with losartan 50 mg by her PCP with good control  BP Readings from Last 3 Encounters:  12/04/17 132/80  11/08/17 126/78  10/29/17 (!) 152/97   Currently on prednisone for temporal arteritis    Examination:   BP 132/80 (BP Location: Left Arm, Patient Position: Sitting, Cuff Size: Large)   Pulse (!) 115   Ht 5\' 5"  (1.651 m)   Wt (!) 351 lb 9.6 oz (159.5 kg)   SpO2 97%   BMI 58.51 kg/m    Biceps reflexes are normal Skin normal No tremor    Assessment   HYPOTHYROIDISM, Post ablative  She has had inconsistent control of her hypothyroidism and no normal TSH for some time  Even with minor changes her TSH has gone from 18 down to suppressed levels and she is requiring less medication again However she did not reduce her dose as directed on her last visit and TSH is still suppressed Clinically doing well with no symptoms of hypothyroidism   Treatment:   She will need to reduce the levothyroxine to the 125 dose but to continue the Unithroid  brand While she is finishing up to 137 though she will take it 6 days a week  New prescription given  Patient Instructions  Skip 1 dose a week    Elayne Snare 12/05/2017, 9:16 PM

## 2017-12-04 NOTE — Patient Instructions (Signed)
Skip 1 dose a week

## 2017-12-11 ENCOUNTER — Ambulatory Visit: Payer: BLUE CROSS/BLUE SHIELD | Admitting: Internal Medicine

## 2017-12-11 DIAGNOSIS — R700 Elevated erythrocyte sedimentation rate: Secondary | ICD-10-CM | POA: Diagnosis not present

## 2017-12-11 DIAGNOSIS — Z6841 Body Mass Index (BMI) 40.0 and over, adult: Secondary | ICD-10-CM | POA: Diagnosis not present

## 2017-12-11 DIAGNOSIS — B370 Candidal stomatitis: Secondary | ICD-10-CM | POA: Diagnosis not present

## 2017-12-11 DIAGNOSIS — R51 Headache: Secondary | ICD-10-CM | POA: Diagnosis not present

## 2017-12-12 NOTE — Patient Instructions (Addendum)
   Test(s) ordered today. Your results will be released to [place] (or called to you) after review, usually within 72hours after test completion. If any changes need to be made, you will be notified at that same time.   Medications reviewed and updated.  Changes include diflucan daily for two week for yeast.   Your prescription(s) have been submitted to your pharmacy. Please take as directed and contact our office if you believe you are having problem(s) with the medication(s).  A referral was ordered for gyn and endocrine.    Please followup in 3 months

## 2017-12-12 NOTE — Progress Notes (Signed)
Subjective:    Patient ID: Erin [name], female    DOB: 11-01-1965, 52 y.o.   MRN: [id number]  HPI The patient is here for follow up.  Temporal arteritis: she is following with rheumatology. She is still having left temple region and across her top of head.  Her pain is 7/10.  She is taking 60 mg of prednisone daily.  Her pain started when her dose was decreased by neurology.  She will likely have an MRI to evaluate for vasculitis.  She has pressure behind her eyes and a little blurry vision.    She had thrush and it is gone.  Her tongue is still sore.  The tongue swells up when she goes to sleep-she notices it in the morning.  She denies difficulty swallowing.  Her taste is sometimes there and sometimes not.  Hypertension: She is taking her medication daily. She is compliant with a low sodium diet.  She denies chest pain, edema, frequent shortness of breath and regular headaches. She is not exercising regularly.       GERD:  She is taking her medication daily as prescribed.  She denies any GERD symptoms and feels her GERD is well controlled.   Prediabetes:  She has not been compliant with a low sugar/carbohydrate diet.  She is currently not exercising regularly.  Morbid obesity:  She is not exercising, but is to start. She is trying improving her eating.  She needs a new gynecologist.  She does have a Mirena and wants to establish with someone new.  Medications and allergies reviewed with patient and updated if appropriate.  Patient Active Problem List   Diagnosis Date Noted  . Left facial pressure and pain 11/09/2017  . Thrush 11/09/2017  . Gastroesophageal reflux disease 10/05/2017  . Temporal arteritis ([place]) 10/03/2017  . Right foot pain 07/15/2017  . Grief at loss of child 11/02/2016  . H/O gestational diabetes mellitus, not currently pregnant 09/28/2015  . Hypertension 09/28/2015  . Prediabetes 09/28/2015  . Morbid obesity ([place]) 05/17/2015  . Umbilical hernia  11/94/1740  . Chronic lower back pain 05/17/2015  . Snoring 05/17/2015  . Palpitations 05/17/2015  . ANEMIA 09/03/2007  . Hypothyroidism following radioiodine therapy 09/02/2007  . INTERSTITIAL CYSTITIS 09/01/2007  . ALLERGIC RHINITIS 08/07/2007  . [place] DISEASE, LUMBAR 08/07/2007    Current Outpatient Medications on File Prior to Visit  Medication Sig Dispense Refill  . cholecalciferol (VITAMIN D) 1000 units tablet Take 1,000 Units by mouth daily.    [name] levonorgestrel (MIRENA, 52 MG,) 20 MCG/24HR IUD Mirena 20 mcg/24 hr (5 years) intrauterine device  Take 1 device by intrauterine route.    [name] losartan (COZAAR) 50 MG tablet TAKE 1 TABLET BY MOUTH EVERY DAY (Patient taking differently: TAKING 100MG  TABLET BY MOUTH EVERY DAY) 90 tablet 0  . pantoprazole (PROTONIX) 40 MG tablet Take 1 tablet (40 mg total) by mouth daily. Take 30 minutes prior to a meal 90 tablet 3  . predniSONE (DELTASONE) 10 MG tablet Take 60 mg by mouth daily with breakfast.     . UNITHROID 125 MCG tablet Take 1 tablet (125 mcg total) by mouth daily before breakfast. 30 tablet 3   No current facility-administered medications on file prior to visit.     Past Medical History:  Diagnosis Date  . Anemia   . Depression   . Diabetes mellitus without complication (HCC)    gestational  . Elevated blood-pressure reading without diagnosis of hypertension   . Endometriosis   .  Fibroids   . GERD (gastroesophageal reflux disease)   . Graves disease   . Grief at loss of child   . Hypertension   . Hypothyroidism   . Menorrhagia   . Premature ventricular contractions    a. rare PVC by monitor 12/2016.  [name] Thyroid disease   . Uterine leiomyoma     Past Surgical History:  Procedure Laterality Date  . APPENDECTOMY    . ARTERY BIOPSY Left 10/25/2017   Procedure: BIOPSY TEMPORAL ARTERY;  Surgeon: Aviva Signs, MD;  Location: [facility];  Service: General;  Laterality: Left;  . CESAREAN SECTION    . CHOLECYSTECTOMY    . HERNIA  REPAIR     incisional  . TUMOR REMOVAL  fibroids    Social History   Socioeconomic History  . Marital status: Married    Spouse name: Not on file  . Number of children: Not on file  . Years of education: Not on file  . Highest education level: Not on file  Occupational History  . Not on file  Social Needs  . Financial resource strain: Not on file  . Food insecurity:    Worry: Not on file    Inability: Not on file  . Transportation needs:    Medical: Not on file    Non-medical: Not on file  Tobacco Use  . Smoking status: Never Smoker  . Smokeless tobacco: Never Used  Substance and Sexual Activity  . Alcohol use: No  . Drug use: No  . Sexual activity: Yes    Birth control/protection: IUD  Lifestyle  . Physical activity:    Days per week: Not on file    Minutes per session: Not on file  . Stress: Not on file  Relationships  . Social connections:    Talks on phone: Not on file    Gets together: Not on file    Attends religious service: Not on file    Active member of club or organization: Not on file    Attends meetings of clubs or organizations: Not on file    Relationship status: Not on file  Other Topics Concern  . Not on file  Social History Narrative  . Not on file    Family History  Problem Relation Age of Onset  . Hypertension Mother   . Cancer Mother   . Hypertension Father   . Diabetes Father   . Cancer Father     Review of Systems  Constitutional: Positive for chills. Negative for fever.  Eyes: Positive for visual disturbance (blurry vision).  Respiratory: Positive for shortness of breath (sometimes). Negative for cough and wheezing.   Cardiovascular: Positive for palpitations. Negative for chest pain and leg swelling.  Gastrointestinal:       Gerd  Neurological: Positive for light-headedness and headaches.       Objective:   Vitals:   12/13/17 1533  BP: (!) 146/96  Pulse: (!) 101  Resp: 18  Temp: 98.2 F (36.8 C)  SpO2: 97%   BP  Readings from Last 3 Encounters:  12/13/17 (!) 146/96  12/04/17 132/80  11/08/17 126/78   Wt Readings from Last 3 Encounters:  12/13/17 (!) 346 lb (156.9 kg)  12/04/17 (!) 351 lb 9.6 oz (159.5 kg)  11/08/17 (!) 342 lb (155.1 kg)   Body mass index is 57.58 kg/m.   Physical Exam    Constitutional: Appears well-developed and well-nourished. No distress.  HENT:  Head: Normocephalic and atraumatic.  Tongue with white coating. Neck:  Neck supple. No tracheal deviation present. No thyromegaly present.  No cervical lymphadenopathy Cardiovascular: Normal rate, regular rhythm and normal heart sounds.   No murmur heard. No carotid bruit .  No edema Pulmonary/Chest: Effort normal and breath sounds normal. No respiratory distress. No has no wheezes. No rales.  Skin: Skin is warm and dry. Not diaphoretic.  Psychiatric: Normal mood and affect. Behavior is normal.      Assessment & Plan:    See Problem List for Assessment and Plan of chronic medical problems.

## 2017-12-13 ENCOUNTER — Encounter: Payer: Self-pay | Admitting: Internal Medicine

## 2017-12-13 ENCOUNTER — Ambulatory Visit: Payer: BLUE CROSS/BLUE SHIELD | Admitting: Internal Medicine

## 2017-12-13 ENCOUNTER — Other Ambulatory Visit (INDEPENDENT_AMBULATORY_CARE_PROVIDER_SITE_OTHER): Payer: BLUE CROSS/BLUE SHIELD

## 2017-12-13 VITALS — BP 146/96 | HR 101 | Temp 98.2°F | Resp 18 | Wt 346.0 lb

## 2017-12-13 DIAGNOSIS — Z124 Encounter for screening for malignant neoplasm of cervix: Secondary | ICD-10-CM

## 2017-12-13 DIAGNOSIS — B370 Candidal stomatitis: Secondary | ICD-10-CM

## 2017-12-13 DIAGNOSIS — K219 Gastro-esophageal reflux disease without esophagitis: Secondary | ICD-10-CM

## 2017-12-13 DIAGNOSIS — M316 Other giant cell arteritis: Secondary | ICD-10-CM

## 2017-12-13 DIAGNOSIS — I10 Essential (primary) hypertension: Secondary | ICD-10-CM

## 2017-12-13 DIAGNOSIS — R7303 Prediabetes: Secondary | ICD-10-CM | POA: Diagnosis not present

## 2017-12-13 DIAGNOSIS — E890 Postprocedural hypothyroidism: Secondary | ICD-10-CM

## 2017-12-13 LAB — COMPREHENSIVE METABOLIC PANEL
ALT: 72 U/L — ABNORMAL HIGH (ref 0–35)
AST: 18 U/L (ref 0–37)
Albumin: 4.2 g/dL (ref 3.5–5.2)
Alkaline Phosphatase: 156 U/L — ABNORMAL HIGH (ref 39–117)
BUN: 22 mg/dL (ref 6–23)
CO2: 29 mEq/L (ref 19–32)
Calcium: 9.7 mg/dL (ref 8.4–10.5)
Chloride: 96 mEq/L (ref 96–112)
Creatinine, Ser: 0.99 mg/dL (ref 0.40–1.20)
GFR: 75.93 mL/min (ref >60.00)
Glucose, Bld: 454 mg/dL — ABNORMAL HIGH (ref 70–99)
Potassium: 4.7 mEq/L (ref 3.5–5.1)
Sodium: 135 mEq/L (ref 135–145)
Total Bilirubin: 0.7 mg/dL (ref 0.2–1.2)
Total Protein: 7.5 g/dL (ref 6.0–8.3)

## 2017-12-13 LAB — HEMOGLOBIN A1C: Hgb A1c MFr Bld: 8.9 % — ABNORMAL HIGH (ref 4.6–6.5)

## 2017-12-13 MED ORDER — ALPRAZOLAM 0.5 MG PO TABS: 0.5000 mg | ORAL_TABLET | Freq: Two times a day (BID) | ORAL | 0 refills | Status: DC | PRN

## 2017-12-13 MED ORDER — FLUCONAZOLE 100 MG PO TABS: 100.0000 mg | ORAL_TABLET | Freq: Every day | ORAL | 0 refills | Status: DC

## 2017-12-13 MED ORDER — LOSARTAN POTASSIUM 100 MG PO TABS: 100.0000 mg | ORAL_TABLET | Freq: Every day | ORAL | 3 refills | Status: DC

## 2017-12-14 NOTE — Assessment & Plan Note (Signed)
Management per endocrine She would like to transfer to a different endocrinologist-referral ordered

## 2017-12-14 NOTE — Assessment & Plan Note (Signed)
Management per rheumatology Currently taking 60 mg of prednisone daily With headache, pressure behind the eye and blurry visionStill symptomatic Will have MRI to evaluate for vasculitis

## 2017-12-14 NOTE — Assessment & Plan Note (Signed)
BP Readings from Last 3 Encounters:  12/13/17 (!) 146/96  12/04/17 132/80  11/08/17 126/78   Blood pressure overall controlled-slightly elevated here Need to monitor No change in medications

## 2017-12-14 NOTE — Assessment & Plan Note (Signed)
She was treated for thrush, but being on the high dose prednisone I believe she still has thrush Fluconazole 100 mg daily for 2 weeks May need a preventative dose after this, but for now we will only treat for 2 weeks

## 2017-12-14 NOTE — Assessment & Plan Note (Signed)
Controlled Continue pantoprazole daily

## 2017-12-14 NOTE — Assessment & Plan Note (Signed)
Taking high-dose steroids, which is not helping She plans on getting to the gym-she is off of the summer and has joined a gym Work on Engineer, manufacturing systems and decreased portions

## 2017-12-14 NOTE — Assessment & Plan Note (Signed)
Has been stable in the prediabetic range, but now is on high-dose steroids and most likely will become a diabetic Check A1c Advised her we may need to start medication Stressed the importance of regular exercise had a low sugar/carbohydrate diet Follow-up in 3 months

## 2017-12-15 ENCOUNTER — Other Ambulatory Visit: Payer: Self-pay | Admitting: Endocrinology

## 2017-12-16 ENCOUNTER — Telehealth: Payer: Self-pay | Admitting: Emergency Medicine

## 2017-12-16 MED ORDER — BLOOD GLUCOSE MONITOR KIT: PACK | 0 refills | Status: DC

## 2017-12-16 MED ORDER — METFORMIN HCL 500 MG PO TABS: 500.0000 mg | ORAL_TABLET | Freq: Two times a day (BID) | ORAL | 0 refills | Status: DC

## 2017-12-16 NOTE — Telephone Encounter (Signed)
Metformin sent to [facility] with glucometer.

## 2017-12-17 ENCOUNTER — Telehealth: Payer: Self-pay | Admitting: Internal Medicine

## 2017-12-17 NOTE — Telephone Encounter (Signed)
Copied from [place] [id number]. Topic: Quick Communication - Rx Refill/Question >> Dec 17, 2017 12:47 PM Erin [name] wrote: Reason for CRM: pt called to let pcp know that the glucose meter did not go through, [facility]

## 2017-12-18 MED ORDER — BLOOD GLUCOSE MONITOR KIT: PACK | 0 refills | Status: DC

## 2017-12-18 NOTE — Telephone Encounter (Signed)
RX refaxed. Pt notified

## 2017-12-18 NOTE — Telephone Encounter (Signed)
Percell Belt A 12/18/2017 11:49 AM  Pt called again about his meter.  [facility] stated [facility] not [facility] about [facility].       Appears this message has still been waiting on Nurse Triage.  Please advise.

## 2017-12-18 NOTE — Addendum Note (Signed)
Addended by: Terence Lux B on: 12/18/2017 12:56 PM   Modules accepted: Orders

## 2018-01-01 DIAGNOSIS — R51 Headache: Secondary | ICD-10-CM | POA: Diagnosis not present

## 2018-01-01 DIAGNOSIS — H25013 Cortical age-related cataract, bilateral: Secondary | ICD-10-CM | POA: Diagnosis not present

## 2018-01-01 DIAGNOSIS — H524 Presbyopia: Secondary | ICD-10-CM | POA: Diagnosis not present

## 2018-01-01 DIAGNOSIS — H2513 Age-related nuclear cataract, bilateral: Secondary | ICD-10-CM | POA: Diagnosis not present

## 2018-01-01 DIAGNOSIS — H52202 Unspecified astigmatism, left eye: Secondary | ICD-10-CM | POA: Diagnosis not present

## 2018-01-01 LAB — HM DIABETES EYE EXAM

## 2018-01-06 ENCOUNTER — Telehealth: Payer: Self-pay | Admitting: Internal Medicine

## 2018-01-06 MED ORDER — METFORMIN HCL ER 500 MG PO TB24: 1000.0000 mg | ORAL_TABLET | Freq: Every day | ORAL | 1 refills | Status: DC

## 2018-01-06 MED ORDER — ONDANSETRON 4 MG PO TBDP: 4.0000 mg | ORAL_TABLET | Freq: Three times a day (TID) | ORAL | 0 refills | Status: DC | PRN

## 2018-01-06 NOTE — Telephone Encounter (Signed)
Anti-nausea sent to [facility].    Not sure if the cotton mouth is thrush or not - did her tongue improve with the dilfucan

## 2018-01-06 NOTE — Telephone Encounter (Signed)
Patient is calling to follow up on this. States it is giving her diarrhea.

## 2018-01-06 NOTE — Telephone Encounter (Signed)
Changed - new rx sent

## 2018-01-06 NOTE — Telephone Encounter (Signed)
Patient says she also has nausea, vomiting, and cotton mouth after being weined off of prednisone and would like additional scripts called in for this. Please advise.

## 2018-01-06 NOTE — Telephone Encounter (Signed)
Patient called [facility] 7/14 at 2:37pm requesting metformin to be changed to Metformin ER.

## 2018-01-07 ENCOUNTER — Other Ambulatory Visit: Payer: Self-pay | Admitting: Internal Medicine

## 2018-01-07 MED ORDER — NYSTATIN 100000 UNIT/ML MT SUSP: 5.0000 mL | Freq: Four times a day (QID) | OROMUCOSAL | 0 refills | Status: DC

## 2018-01-07 NOTE — Telephone Encounter (Signed)
Spoke with pt to inform.  

## 2018-01-07 NOTE — Telephone Encounter (Signed)
Nystatin mouthwash sent to pharmacy 

## 2018-01-07 NOTE — Telephone Encounter (Signed)
Spoke with pt, pt states that the thrush did improve greatly when she took the diflucan. She states that it came back after they started weaning down on the prednisone. She states she has used mouth wash in the past for thrush and felt that that worked well.

## 2018-01-08 ENCOUNTER — Telehealth: Payer: Self-pay | Admitting: Obstetrics and Gynecology

## 2018-01-08 ENCOUNTER — Telehealth: Payer: Self-pay | Admitting: Internal Medicine

## 2018-01-08 ENCOUNTER — Encounter: Payer: Self-pay | Admitting: Internal Medicine

## 2018-01-08 NOTE — Telephone Encounter (Signed)
Copied from [place] [phone]. Topic: Quick Communication - See Telephone Encounter >> Jan 08, 2018  2:55 PM Synthia Innocent wrote: CRM for notification. See Telephone encounter for: 01/08/18. Unable to take nystatin (MYCOSTATIN) 100000 UNIT/ML suspension, needs pill form. Please advise

## 2018-01-08 NOTE — Telephone Encounter (Signed)
Patient cancelled new patient doctor referral due to side effects from a new medication. Stated that she would call back once that was worked out, to reschedule.

## 2018-01-08 NOTE — Progress Notes (Deleted)
52 y.o. No obstetric history on file. MarriedAfrican AmericanF here for annual exam.      No LMP recorded. (Menstrual status: IUD).          Sexually active: {yes no:314532}  The current method of family planning is {contraception:315051}.    Exercising: {yes no:314532}  {types:19826} Smoker:  {YES NO:22349}  Health Maintenance: Pap:  *** History of abnormal Pap:  {YES NO:22349} MMG:  *** Colonoscopy:  *** BMD:   *** TDaP:  *** Gardasil: ***   reports that she has never smoked. She has never used smokeless tobacco. She reports that she does not drink alcohol or use drugs.  Past Medical History:  Diagnosis Date  . Anemia   . Depression   . Diabetes mellitus without complication (HCC)    gestational  . Elevated blood-pressure reading without diagnosis of hypertension   . Endometriosis   . Fibroids   . GERD (gastroesophageal reflux disease)   . Graves disease   . Grief at loss of child   . Hypertension   . Hypothyroidism   . Menorrhagia   . Premature ventricular contractions    a. rare PVC by monitor 12/2016.  [name] Thyroid disease   . Uterine leiomyoma     Past Surgical History:  Procedure Laterality Date  . APPENDECTOMY    . ARTERY BIOPSY Left 10/25/2017   Procedure: BIOPSY TEMPORAL ARTERY;  Surgeon: Aviva Signs, MD;  Location: [facility];  Service: General;  Laterality: Left;  . CESAREAN SECTION    . CHOLECYSTECTOMY    . HERNIA REPAIR     incisional  . TUMOR REMOVAL  fibroids    Current Outpatient Medications  Medication Sig Dispense Refill  . ALPRAZolam (XANAX) 0.5 MG tablet Take 1 tablet (0.5 mg total) by mouth 2 (two) times daily as needed for anxiety.  0  . blood glucose meter kit and supplies KIT Dispense based on patient and insurance preference. Use up to twice daily. 1 each 0  . cholecalciferol (VITAMIN D) 1000 units tablet Take 1,000 Units by mouth daily.    . fluconazole (DIFLUCAN) 100 MG tablet Take 1 tablet (100 mg total) by mouth daily. 14 tablet 0  .  levonorgestrel (MIRENA, 52 MG,) 20 MCG/24HR IUD Mirena 20 mcg/24 hr (5 years) intrauterine device  Take 1 device by intrauterine route.    [name] levothyroxine (SYNTHROID, LEVOTHROID) 137 MCG tablet TAKE 1 TABLET (137 MCG TOTAL) BY MOUTH DAILY BEFORE BREAKFAST. 30 tablet 2  . losartan (COZAAR) 100 MG tablet Take 1 tablet (100 mg total) by mouth daily. 90 tablet 3  . metFORMIN (GLUCOPHAGE XR) 500 MG 24 hr tablet Take 2 tablets (1,000 mg total) by mouth daily with breakfast. 180 tablet 1  . nystatin (MYCOSTATIN) 100000 UNIT/ML suspension Take 5 mLs (500,000 Units total) by mouth 4 (four) times daily. 200 mL 0  . ondansetron (ZOFRAN ODT) 4 MG disintegrating tablet Take 1 tablet (4 mg total) by mouth every 8 (eight) hours as needed for nausea or vomiting. 20 tablet 0  . pantoprazole (PROTONIX) 40 MG tablet Take 1 tablet (40 mg total) by mouth daily. Take 30 minutes prior to a meal 90 tablet 3  . predniSONE (DELTASONE) 10 MG tablet Take 60 mg by mouth daily with breakfast.     . UNITHROID 125 MCG tablet Take 1 tablet (125 mcg total) by mouth daily before breakfast. 30 tablet 3   No current facility-administered medications for this visit.     Family History  Problem Relation  Age of Onset  . Hypertension Mother   . Cancer Mother   . Hypertension Father   . Diabetes Father   . Cancer Father     Review of Systems  Exam:   There were no vitals taken for this visit.  Weight change: @WEIGHTCHANGE @ Height:      Ht Readings from Last 3 Encounters:  12/04/17 5' 5"  (1.651 m)  11/08/17 5' 5"  (1.651 m)  10/29/17 5' 5"  (1.651 m)    General appearance: alert, cooperative and appears stated age Head: Normocephalic, without obvious abnormality, atraumatic Neck: no adenopathy, supple, symmetrical, trachea midline and thyroid {CHL AMB PHY EX THYROID NORM DEFAULT:[phone]::"normal to inspection and palpation"} Lungs: clear to auscultation bilaterally Cardiovascular: regular rate and rhythm Breasts: {Exam;  breast:13139::"normal appearance, no masses or tenderness"} Abdomen: soft, non-tender; non distended,  no masses,  no organomegaly Extremities: extremities normal, atraumatic, no cyanosis or edema Skin: Skin color, texture, turgor normal. No rashes or lesions Lymph nodes: Cervical, supraclavicular, and axillary nodes normal. No abnormal inguinal nodes palpated Neurologic: Grossly normal   Pelvic: External genitalia:  no lesions              Urethra:  normal appearing urethra with no masses, tenderness or lesions              Bartholins and Skenes: normal                 Vagina: normal appearing vagina with normal color and discharge, no lesions              Cervix: {CHL AMB PHY EX CERVIX NORM [phone]::"no lesions"}               Bimanual Exam:  Uterus:  {CHL AMB PHY EX UTERUS NORM [phone]::"normal size, contour, position, consistency, mobility, non-tender"}              Adnexa: {CHL AMB PHY EX ADNEXA NO MASS DEFAULT:[phone]::"no mass, fullness, tenderness"}               Rectovaginal: Confirms               Anus:  normal sphincter tone, no lesions  Chaperone was present for exam.  A:  Well Woman with normal exam  P:

## 2018-01-09 ENCOUNTER — Encounter: Payer: BLUE CROSS/BLUE SHIELD | Admitting: Obstetrics and Gynecology

## 2018-01-09 MED ORDER — CLOTRIMAZOLE 10 MG MT TROC: 10.0000 mg | Freq: Every day | OROMUCOSAL | 0 refills | Status: DC

## 2018-01-09 NOTE — Telephone Encounter (Signed)
Pt called stating that clotrimazole is making her vomit. She is wanting to change back to diflucan. Pt states she is feeling very dehydrated and may go to ER.   Call back [phone]  [id number] [place], [place] [address]

## 2018-01-09 NOTE — Telephone Encounter (Signed)
Spoke with pt to inform. Pt states she now has a yeast infection in vaginal area. Would like something sent in for this.

## 2018-01-09 NOTE — Telephone Encounter (Signed)
rx for troches sent - this is not the pills, but will dissolve in her mouth

## 2018-01-10 MED ORDER — FLUCONAZOLE 100 MG PO TABS: 100.0000 mg | ORAL_TABLET | Freq: Every day | ORAL | 0 refills | Status: DC

## 2018-01-10 NOTE — Telephone Encounter (Signed)
I would recommend visit to evaluate.

## 2018-01-10 NOTE — Telephone Encounter (Signed)
Notified pt w/MD response. Pt states she is wanting to know what she can take for diarrhea sxs. She is trying to prevent being dehydrated. Everything that she eats or drink send her to the bathroom. She states she want to avoid going to the ER. Inform pt will send msg to another provider since MD is out of the office until 01/20/18. Pls advise...Johny Chess

## 2018-01-10 NOTE — Telephone Encounter (Signed)
diflucan sent to [facility].  If it does not clear up completely let us know

## 2018-01-10 NOTE — Telephone Encounter (Signed)
Notified pt w/MD response. Pt states she is going to try some home remedy first, and med that was rx. If no better by [facility] will call back for appt.Marland KitchenJohny [name]

## 2018-01-15 ENCOUNTER — Emergency Department (HOSPITAL_COMMUNITY): Payer: BLUE CROSS/BLUE SHIELD

## 2018-01-15 ENCOUNTER — Other Ambulatory Visit: Payer: Self-pay | Admitting: Internal Medicine

## 2018-01-15 ENCOUNTER — Inpatient Hospital Stay (HOSPITAL_COMMUNITY)
Admission: EM | Admit: 2018-01-15 | Discharge: 2018-01-20 | DRG: 638 | Disposition: A | Discharge status: Home / Self Care | Payer: BLUE CROSS/BLUE SHIELD | Attending: Internal Medicine | Admitting: Internal Medicine

## 2018-01-15 ENCOUNTER — Inpatient Hospital Stay (HOSPITAL_COMMUNITY): Payer: BLUE CROSS/BLUE SHIELD

## 2018-01-15 ENCOUNTER — Encounter (HOSPITAL_COMMUNITY): Payer: Self-pay | Admitting: *Deleted

## 2018-01-15 DIAGNOSIS — Z7952 Long term (current) use of systemic steroids: Secondary | ICD-10-CM

## 2018-01-15 DIAGNOSIS — B373 Candidiasis of vulva and vagina: Secondary | ICD-10-CM | POA: Diagnosis not present

## 2018-01-15 DIAGNOSIS — Z975 Presence of (intrauterine) contraceptive device: Secondary | ICD-10-CM | POA: Diagnosis not present

## 2018-01-15 DIAGNOSIS — E890 Postprocedural hypothyroidism: Secondary | ICD-10-CM | POA: Diagnosis present

## 2018-01-15 DIAGNOSIS — E1100 Type 2 diabetes mellitus with hyperosmolarity without nonketotic hyperglycemic-hyperosmolar coma (NKHHC): Principal | ICD-10-CM | POA: Diagnosis present

## 2018-01-15 DIAGNOSIS — B3731 Acute candidiasis of vulva and vagina: Secondary | ICD-10-CM | POA: Diagnosis present

## 2018-01-15 DIAGNOSIS — D259 Leiomyoma of uterus, unspecified: Secondary | ICD-10-CM | POA: Diagnosis present

## 2018-01-15 DIAGNOSIS — Z885 Allergy status to narcotic agent status: Secondary | ICD-10-CM

## 2018-01-15 DIAGNOSIS — E1110 Type 2 diabetes mellitus with ketoacidosis without coma: Secondary | ICD-10-CM | POA: Diagnosis present

## 2018-01-15 DIAGNOSIS — R0602 Shortness of breath: Secondary | ICD-10-CM | POA: Diagnosis present

## 2018-01-15 DIAGNOSIS — R002 Palpitations: Secondary | ICD-10-CM | POA: Diagnosis present

## 2018-01-15 DIAGNOSIS — E1310 Other specified diabetes mellitus with ketoacidosis without coma: Secondary | ICD-10-CM | POA: Diagnosis not present

## 2018-01-15 DIAGNOSIS — K529 Noninfective gastroenteritis and colitis, unspecified: Secondary | ICD-10-CM | POA: Diagnosis present

## 2018-01-15 DIAGNOSIS — E876 Hypokalemia: Secondary | ICD-10-CM | POA: Diagnosis present

## 2018-01-15 DIAGNOSIS — T380X5A Adverse effect of glucocorticoids and synthetic analogues, initial encounter: Secondary | ICD-10-CM | POA: Diagnosis present

## 2018-01-15 DIAGNOSIS — K219 Gastro-esophageal reflux disease without esophagitis: Secondary | ICD-10-CM | POA: Diagnosis not present

## 2018-01-15 DIAGNOSIS — Z79890 Hormone replacement therapy (postmenopausal): Secondary | ICD-10-CM

## 2018-01-15 DIAGNOSIS — R531 Weakness: Secondary | ICD-10-CM | POA: Diagnosis not present

## 2018-01-15 DIAGNOSIS — E119 Type 2 diabetes mellitus without complications: Secondary | ICD-10-CM

## 2018-01-15 DIAGNOSIS — M316 Other giant cell arteritis: Secondary | ICD-10-CM | POA: Diagnosis not present

## 2018-01-15 DIAGNOSIS — E0810 Diabetes mellitus due to underlying condition with ketoacidosis without coma: Secondary | ICD-10-CM

## 2018-01-15 DIAGNOSIS — K760 Fatty (change of) liver, not elsewhere classified: Secondary | ICD-10-CM | POA: Diagnosis present

## 2018-01-15 DIAGNOSIS — E0500 Thyrotoxicosis with diffuse goiter without thyrotoxic crisis or storm: Secondary | ICD-10-CM | POA: Diagnosis not present

## 2018-01-15 DIAGNOSIS — Z6841 Body Mass Index (BMI) 40.0 and over, adult: Secondary | ICD-10-CM

## 2018-01-15 DIAGNOSIS — I10 Essential (primary) hypertension: Secondary | ICD-10-CM | POA: Diagnosis present

## 2018-01-15 DIAGNOSIS — E860 Dehydration: Secondary | ICD-10-CM | POA: Diagnosis not present

## 2018-01-15 DIAGNOSIS — N179 Acute kidney failure, unspecified: Secondary | ICD-10-CM | POA: Diagnosis not present

## 2018-01-15 DIAGNOSIS — R748 Abnormal levels of other serum enzymes: Secondary | ICD-10-CM | POA: Diagnosis not present

## 2018-01-15 DIAGNOSIS — Z7984 Long term (current) use of oral hypoglycemic drugs: Secondary | ICD-10-CM

## 2018-01-15 DIAGNOSIS — R0600 Dyspnea, unspecified: Secondary | ICD-10-CM | POA: Diagnosis not present

## 2018-01-15 DIAGNOSIS — R319 Hematuria, unspecified: Secondary | ICD-10-CM | POA: Diagnosis present

## 2018-01-15 DIAGNOSIS — B370 Candidal stomatitis: Secondary | ICD-10-CM | POA: Diagnosis present

## 2018-01-15 DIAGNOSIS — I5030 Unspecified diastolic (congestive) heart failure: Secondary | ICD-10-CM | POA: Diagnosis not present

## 2018-01-15 DIAGNOSIS — Z888 Allergy status to other drugs, medicaments and biological substances status: Secondary | ICD-10-CM | POA: Diagnosis not present

## 2018-01-15 DIAGNOSIS — R51 Headache: Secondary | ICD-10-CM | POA: Diagnosis not present

## 2018-01-15 DIAGNOSIS — R700 Elevated erythrocyte sedimentation rate: Secondary | ICD-10-CM | POA: Diagnosis not present

## 2018-01-15 LAB — CBC WITH DIFFERENTIAL/PLATELET
Basophils Absolute: 0 10*3/uL (ref 0.0–0.1)
Basophils Relative: 0 %
Eosinophils Absolute: 0 10*3/uL (ref 0.0–0.7)
Eosinophils Relative: 0 %
HCT: 45.4 % (ref 36.0–46.0)
Hemoglobin: 14.6 g/dL (ref 12.0–15.0)
Lymphocytes Relative: 14 %
Lymphs Abs: 1.6 10*3/uL (ref 0.7–4.0)
MCH: 27.4 pg (ref 26.0–34.0)
MCHC: 32.2 g/dL (ref 30.0–36.0)
MCV: 85.2 fL (ref 78.0–100.0)
Monocytes Absolute: 0.3 10*3/uL (ref 0.1–1.0)
Monocytes Relative: 2 %
Neutro Abs: 9.7 10*3/uL — ABNORMAL HIGH (ref 1.7–7.7)
Neutrophils Relative %: 84 %
Platelets: 199 10*3/uL (ref 150–400)
RBC: 5.33 MIL/uL — ABNORMAL HIGH (ref 3.87–5.11)
RDW: 15.1 % (ref 11.5–15.5)
WBC: 11.6 10*3/uL — ABNORMAL HIGH (ref 4.0–10.5)

## 2018-01-15 LAB — COMPREHENSIVE METABOLIC PANEL
ALT: 96 U/L — ABNORMAL HIGH (ref 0–44)
AST: 48 U/L — ABNORMAL HIGH (ref 15–41)
Albumin: 4 g/dL (ref 3.5–5.0)
Alkaline Phosphatase: 199 U/L — ABNORMAL HIGH (ref 38–126)
Anion gap: 20 — ABNORMAL HIGH (ref 5–15)
BUN: 25 mg/dL — ABNORMAL HIGH (ref 6–20)
CO2: 20 mmol/L — ABNORMAL LOW (ref 22–32)
Calcium: 9.9 mg/dL (ref 8.9–10.3)
Chloride: 100 mmol/L (ref 98–111)
Creatinine, Ser: 1.31 mg/dL — ABNORMAL HIGH (ref 0.44–1.00)
GFR calc Af Amer: 54 mL/min — ABNORMAL LOW (ref >60)
GFR calc non Af Amer: 46 mL/min — ABNORMAL LOW (ref >60)
Glucose, Bld: 732 mg/dL (ref 70–99)
Potassium: 4.9 mmol/L (ref 3.5–5.1)
Sodium: 140 mmol/L (ref 135–145)
Total Bilirubin: 2.3 mg/dL — ABNORMAL HIGH (ref 0.3–1.2)
Total Protein: 7.3 g/dL (ref 6.5–8.1)

## 2018-01-15 LAB — URINALYSIS, ROUTINE W REFLEX MICROSCOPIC
Bacteria, UA: NONE SEEN
Bilirubin Urine: NEGATIVE
Glucose, UA: >=500 mg/dL — AB
Ketones, ur: 20 mg/dL — AB
Leukocytes, UA: NEGATIVE
Nitrite: NEGATIVE
Protein, ur: NEGATIVE mg/dL
Specific Gravity, Urine: 1.037 — ABNORMAL HIGH (ref 1.005–1.030)
pH: 5 (ref 5.0–8.0)

## 2018-01-15 LAB — TROPONIN I: Troponin I: <0.03 ng/mL (ref <0.03)

## 2018-01-15 LAB — CBG MONITORING, ED: Glucose-Capillary: 574 mg/dL (ref 70–99)

## 2018-01-15 LAB — CK: Total CK: 61 U/L (ref 38–234)

## 2018-01-15 LAB — C-REACTIVE PROTEIN: CRP: 1.6 mg/dL — ABNORMAL HIGH (ref <1.0)

## 2018-01-15 LAB — TSH: TSH: 0.288 u[IU]/mL — ABNORMAL LOW (ref 0.350–4.500)

## 2018-01-15 LAB — SEDIMENTATION RATE: Sed Rate: 8 mm/hr (ref 0–22)

## 2018-01-15 MED ORDER — SODIUM CHLORIDE 0.9 % IV BOLUS
500.0000 mL | Freq: Once | INTRAVENOUS | Status: AC
  Administered 2018-01-15: 500 mL via INTRAVENOUS

## 2018-01-15 MED ORDER — SODIUM CHLORIDE 0.9 % IV SOLN
INTRAVENOUS | Status: DC
  Administered 2018-01-15: 23:00:00 via INTRAVENOUS

## 2018-01-15 MED ORDER — SODIUM CHLORIDE 0.9 % IV SOLN
INTRAVENOUS | Status: DC
  Administered 2018-01-15: 5.4 [IU]/h via INTRAVENOUS
  Filled 2018-01-15: qty 1

## 2018-01-15 MED ORDER — DEXTROSE-NACL 5-0.45 % IV SOLN: INTRAVENOUS | Status: DC

## 2018-01-15 MED ORDER — LEVOTHYROXINE SODIUM 25 MCG PO TABS
125.0000 ug | ORAL_TABLET | Freq: Every day | ORAL | Status: DC
  Administered 2018-01-16 – 2018-01-20 (×5): 125 ug via ORAL
  Filled 2018-01-15 (×5): qty 1

## 2018-01-15 MED ORDER — PANTOPRAZOLE SODIUM 40 MG PO TBEC
40.0000 mg | DELAYED_RELEASE_TABLET | Freq: Every day | ORAL | Status: DC
  Administered 2018-01-16 – 2018-01-20 (×5): 40 mg via ORAL
  Filled 2018-01-15 (×5): qty 1

## 2018-01-15 MED ORDER — ACETAMINOPHEN 650 MG RE SUPP: 650.0000 mg | Freq: Four times a day (QID) | RECTAL | Status: DC | PRN

## 2018-01-15 MED ORDER — PREDNISONE 50 MG PO TABS
50.0000 mg | ORAL_TABLET | Freq: Every day | ORAL | Status: DC
  Administered 2018-01-16 – 2018-01-20 (×5): 50 mg via ORAL
  Filled 2018-01-15: qty 1
  Filled 2018-01-15: qty 2
  Filled 2018-01-15: qty 1
  Filled 2018-01-15: qty 2
  Filled 2018-01-15: qty 1

## 2018-01-15 MED ORDER — ALPRAZOLAM 0.5 MG PO TABS
0.5000 mg | ORAL_TABLET | Freq: Every day | ORAL | Status: DC | PRN
  Administered 2018-01-16 – 2018-01-19 (×3): 0.5 mg via ORAL
  Filled 2018-01-15 (×3): qty 1

## 2018-01-15 MED ORDER — ONDANSETRON HCL 4 MG/2ML IJ SOLN: 4.0000 mg | Freq: Four times a day (QID) | INTRAMUSCULAR | Status: DC | PRN

## 2018-01-15 MED ORDER — SODIUM CHLORIDE 0.9 % IV BOLUS
1000.0000 mL | Freq: Once | INTRAVENOUS | Status: AC
  Administered 2018-01-15: 1000 mL via INTRAVENOUS

## 2018-01-15 MED ORDER — NYSTATIN 100000 UNIT/ML MT SUSP
5.0000 mL | Freq: Four times a day (QID) | OROMUCOSAL | Status: DC
  Administered 2018-01-16 – 2018-01-20 (×19): 500000 [IU] via ORAL
  Filled 2018-01-15 (×19): qty 5

## 2018-01-15 MED ORDER — ENOXAPARIN SODIUM 80 MG/0.8ML ~~LOC~~ SOLN
70.0000 mg | Freq: Every day | SUBCUTANEOUS | Status: DC
  Administered 2018-01-16 – 2018-01-20 (×5): 70 mg via SUBCUTANEOUS
  Filled 2018-01-15: qty 0.7
  Filled 2018-01-15 (×2): qty 0.8
  Filled 2018-01-15: qty 0.7
  Filled 2018-01-15: qty 0.8

## 2018-01-15 MED ORDER — POTASSIUM CHLORIDE 10 MEQ/100ML IV SOLN
10.0000 meq | INTRAVENOUS | Status: AC
  Administered 2018-01-15: 10 meq via INTRAVENOUS
  Filled 2018-01-15: qty 100

## 2018-01-15 MED ORDER — PROMETHAZINE HCL 25 MG/ML IJ SOLN
12.5000 mg | Freq: Four times a day (QID) | INTRAMUSCULAR | Status: AC | PRN
  Administered 2018-01-16 – 2018-01-18 (×2): 12.5 mg via INTRAVENOUS
  Filled 2018-01-15 (×2): qty 1

## 2018-01-15 MED ORDER — FLUCONAZOLE 100 MG PO TABS: 100.0000 mg | ORAL_TABLET | Freq: Every day | ORAL | Status: DC

## 2018-01-15 MED ORDER — ONDANSETRON HCL 4 MG PO TABS: 4.0000 mg | ORAL_TABLET | Freq: Four times a day (QID) | ORAL | Status: DC | PRN

## 2018-01-15 MED ORDER — SODIUM CHLORIDE 0.9 % IV BOLUS
500.0000 mL | Freq: Once | INTRAVENOUS | Status: AC
Start: 2018-01-15 — End: 2018-01-15
  Administered 2018-01-15: 500 mL via INTRAVENOUS

## 2018-01-15 MED ORDER — ACETAMINOPHEN 325 MG PO TABS
650.0000 mg | ORAL_TABLET | Freq: Four times a day (QID) | ORAL | Status: DC | PRN
  Filled 2018-01-15: qty 2

## 2018-01-15 NOTE — ED Notes (Signed)
Attempted lab draw x 2 but unsuccessful. 

## 2018-01-15 NOTE — ED Triage Notes (Signed)
Pt sent by her rheumatologist for weakness, dyspnea, fatigue x 2 weeks. Pt was on 60 mg prednisone for possible temporal arteritis. Pt has been tapering down and began feeling worse.

## 2018-01-15 NOTE — ED Notes (Signed)
Pt. Made aware NPO at this time per RN,Jon.

## 2018-01-15 NOTE — H&P (Addendum)
History and Physical    Erin [name] [id number] DOB: 1966/05/14 DOA: 01/15/2018  PCP: Binnie Rail, MD   Patient coming from: Home  Chief Complaint: SOB, fatigue  HPI: Erin [name] is a 52 y.o. female with medical history significant for temporal arteritis, GERD, DM, hypothyroidism following radioiodine therapy, morbid obesity, who presented to the ED with  C/o SOB of several months, but worse over the past 3 weeks when her prednisone taper began. SOB is worse with mild exertion, like ambulating few feet, even with standing. She denies lower extremity swelling, redness or pain, she denies cough. Patient also reports chest pain intermittent and palpitations over several months, last episode of chest pain was 2 days ago, she was trying to get up from a lying position at 3am in the morning, lasted ~ 1-2 secs, central chest mostly, radiating down left arm. She has always attributed this pain to her GERD.   Never smoker, no alcohol, denies illicit drug use.  She denies premature family history of attacks. Patient is on chronic prednisone for temporal arteritis, the diagnoses of which is being questioned by her rheumatologist.   Patient was started on metformin a few weeks ago, but she has been having loose stools since then.  She also reports difficulty swallowing as a result of thrush for which she is on fluconazole.  Patient saw her rheumatologist today, and was sent to the ED. her rheumatologist recommended ESR CRP and CK check.  ED Course: Tachycardia 116- 124, respiratory rate 18, blood pressure systolic 301, O2 sats greater than 92% on room air.  Glucose elevated 732, anion gap - 20, bicarb - 20, creatinine elevated from baseline 1.3, BUN- 25,  Elevated liver enzymes AST ALT ALP total bili. WBC- 11.6,. UA-glucose ketones large hemoglobin. TSH low 0.28. Two-view chest x-ray negative for acute abnormality. ESR- 8, Ck- 61. EDP attempted contacting rheumatologist office but it was closed.  Hospitalist was called to admit for DKA.  Review of Systems: As per HPI otherwise negative review of systems.   Past Medical History:  Diagnosis Date  . Anemia   . Depression   . Diabetes mellitus without complication (HCC)    gestational  . Elevated blood-pressure reading without diagnosis of hypertension   . Endometriosis   . Fibroids   . GERD (gastroesophageal reflux disease)   . Graves disease   . Grief at loss of child   . Hypertension   . Hypothyroidism   . Menorrhagia   . Premature ventricular contractions    a. rare PVC by monitor 12/2016.  [name] Thyroid disease   . Uterine leiomyoma     Past Surgical History:  Procedure Laterality Date  . APPENDECTOMY    . ARTERY BIOPSY Left 10/25/2017   Procedure: BIOPSY TEMPORAL ARTERY;  Surgeon: Aviva Signs, MD;  Location: [facility];  Service: General;  Laterality: Left;  . CESAREAN SECTION    . CHOLECYSTECTOMY    . HERNIA REPAIR     incisional  . TUMOR REMOVAL  fibroids     reports that she has never smoked. She has never used smokeless tobacco. She reports that she does not drink alcohol or use drugs.  Allergies  Allergen Reactions  . Prozac [Fluoxetine Hcl] Nausea Only  . Hydrocodone Hives, Itching and Rash    On thighs     Family History  Problem Relation Age of Onset  . Hypertension Mother   . Cancer Mother   . Hypertension Father   . Diabetes Father   .  Cancer Father     Prior to Admission medications   Medication Sig Start Date End Date Taking? Authorizing Provider  ALPRAZolam Duanne Moron) 0.5 MG tablet Take 1 tablet (0.5 mg total) by mouth 2 (two) times daily as needed for anxiety. 12/13/17  Yes Burns, Claudina Lick, MD  antiseptic oral rinse (BIOTENE) LIQD 15 mLs by Mouth Rinse route as needed for dry mouth or mouth pain.   Yes [provider]  cholecalciferol (VITAMIN D) 1000 units tablet Take 1,000 Units by mouth daily.   Yes [provider]  fluconazole (DIFLUCAN) 100 MG tablet Take 1 tablet (100 mg  total) by mouth daily. 01/10/18  Yes Burns, Claudina Lick, MD  levonorgestrel (MIRENA, 52 MG,) 20 MCG/24HR IUD Mirena 20 mcg/24 hr (5 years) intrauterine device  Take 1 device by intrauterine route. 01/12/16  Yes [provider]  levothyroxine (SYNTHROID, LEVOTHROID) 137 MCG tablet TAKE 1 TABLET (137 MCG TOTAL) BY MOUTH DAILY BEFORE BREAKFAST. 12/16/17  Yes Elayne Snare, MD  losartan (COZAAR) 100 MG tablet Take 1 tablet (100 mg total) by mouth daily. 12/13/17  Yes Burns, Claudina Lick, MD  metFORMIN (GLUCOPHAGE XR) 500 MG 24 hr tablet Take 2 tablets (1,000 mg total) by mouth daily with breakfast. 01/06/18  Yes Burns, Claudina Lick, MD  pantoprazole (PROTONIX) 40 MG tablet Take 1 tablet (40 mg total) by mouth daily. Take 30 minutes prior to a meal 10/04/17  Yes Burns, Claudina Lick, MD  predniSONE (DELTASONE) 10 MG tablet Take 50 mg by mouth daily with breakfast.    Yes [provider]  blood glucose meter kit and supplies KIT Dispense based on patient and insurance preference. Use up to twice daily. 12/18/17   Binnie Rail, MD  clotrimazole (MYCELEX) 10 MG troche Take 1 tablet (10 mg total) by mouth 5 (five) times daily. 01/09/18   Binnie Rail, MD  CONTOUR TEST test strip TEST TWICE DAILY 01/15/18   Binnie Rail, MD  nystatin (MYCOSTATIN) 100000 UNIT/ML suspension Take 5 mLs (500,000 Units total) by mouth 4 (four) times daily. 01/07/18   Binnie Rail, MD  ondansetron (ZOFRAN ODT) 4 MG disintegrating tablet Take 1 tablet (4 mg total) by mouth every 8 (eight) hours as needed for nausea or vomiting. 01/06/18   Binnie Rail, MD  UNITHROID 125 MCG tablet Take 1 tablet (125 mcg total) by mouth daily before breakfast. Patient not taking: Reported on 01/15/2018 12/04/17   Elayne Snare, MD    Physical Exam: Vitals:   01/15/18 1458 01/15/18 1914  BP: (!) 116/94 (!) 119/94  Pulse: (!) 127 (!) 116  Resp: 18 (!) 24  Temp: 98.6 F (37 C) 98.5 F (36.9 C)  TempSrc: Oral Oral  SpO2: 92% 96%    Constitutional:  NAD, calm, comfortable Vitals:   01/15/18 1458 01/15/18 1914  BP: (!) 116/94 (!) 119/94  Pulse: (!) 127 (!) 116  Resp: 18 (!) 24  Temp: 98.6 F (37 C) 98.5 F (36.9 C)  TempSrc: Oral Oral  SpO2: 92% 96%   Eyes: PERRL, lids and conjunctivae normal ENMT: Mucous membranes are dry, with white- cream-colored exudates on tongue. Posterior pharynx clear of any exudate or lesions. Neck: normal, supple, no masses, no thyromegaly Respiratory: clear to auscultation bilaterally, no wheezing, no crackles. Normal respiratory effort. No accessory muscle use.  Cardiovascular: Mild tachycardia but regular rate and rhythm, no murmurs / rubs / gallops. Trace bilat extremity edema. 2+ pedal pulses.  Abdomen: obese, no tenderness, no masses palpated.  No hepatosplenomegaly.  Musculoskeletal: no clubbing / cyanosis. No joint deformity upper and lower extremities. Good ROM, no contractures. Normal muscle tone.  Skin: no rashes, lesions, ulcers. No induration Neurologic: CN 2-12 grossly intact. Strength 5/5 in all 4.  Psychiatric: Normal judgment and insight. Alert and oriented x 3. Normal mood.   Labs on Admission: I have personally reviewed following labs and imaging studies  CBC: Recent Labs  Lab 01/15/18 1815  WBC 11.6*  NEUTROABS 9.7*  HGB 14.6  HCT 45.4  MCV 85.2  PLT 836   Basic Metabolic Panel: Recent Labs  Lab 01/15/18 1815  NA 140  K 4.9  CL 100  CO2 20*  GLUCOSE 732*  BUN 25*  CREATININE 1.31*  CALCIUM 9.9   GFR: CrCl cannot be calculated (Unknown ideal weight.). Liver Function Tests: Recent Labs  Lab 01/15/18 1815  AST 48*  ALT 96*  ALKPHOS 199*  BILITOT 2.3*  PROT 7.3  ALBUMIN 4.0   Cardiac Enzymes: Recent Labs  Lab 01/15/18 1815  CKTOTAL 61   Thyroid Function Tests: Recent Labs    01/15/18 1815  TSH 0.288*   Urine analysis:    Component Value Date/Time   COLORURINE YELLOW 01/15/2018 1815   APPEARANCEUR CLEAR 01/15/2018 1815   LABSPEC 1.037 (H)  01/15/2018 1815   PHURINE 5.0 01/15/2018 1815   GLUCOSEU >=500 (A) 01/15/2018 1815   HGBUR LARGE (A) 01/15/2018 1815   HGBUR negative 09/03/2007 1503   BILIRUBINUR NEGATIVE 01/15/2018 1815   KETONESUR 20 (A) 01/15/2018 1815   PROTEINUR NEGATIVE 01/15/2018 1815   UROBILINOGEN 0.2 01/16/2011 0439   NITRITE NEGATIVE 01/15/2018 1815   LEUKOCYTESUR NEGATIVE 01/15/2018 1815    Radiological Exams on Admission: Dg Chest 2 View  Result Date: 01/15/2018 CLINICAL DATA:  Weakness, dyspnea, fatigue for 2 weeks. EXAM: CHEST - 2 VIEW COMPARISON:  Chest x-ray dated 08/17/2016. FINDINGS: The heart size and mediastinal contours are within normal limits. Both lungs are clear. The visualized skeletal structures are unremarkable. IMPRESSION: No active cardiopulmonary disease. No evidence of pneumonia or pulmonary edema. Electronically Signed   By: Franki Cabot M.D.   On: 01/15/2018 17:33    EKG: Independently reviewed.  Nonspecific T wave changes lead III only.  Assessment/Plan Principal Problem:   DKA (diabetic ketoacidoses) (HCC) Active Problems:   Hypothyroidism following radioiodine therapy   Morbid obesity ([place])   Hypertension   Temporal arteritis ([place])  DKA-likely steroid-induced diabetes.  Glucose- 732, anion gap-20, bicarb- 20.  UA ketones.  Two-view chest x-ray- clear. 566m bolus given in ED. Last Hgba1c- 11/2017- 8.9.  On metformin reports compliance -Continue insulin GTT per DKA protocol started in ED - Bolus 1L N/s, continue maintenance fluid Ns 125cc/hr X 12 hr - BMP repeat and in a.m - HgbA1c -Will need alternative hypoglycemic agent on discharge, pending hgba1c Addendum-anion gap closed , acidosis resolved.  Start Lantus 20 units daily , continue drip for another 2 hours then start sliding scale with meals.  BMP a.m. IV fluids changed to half-normal saline.  Acute kidney injury- Cr- 1.3, baseline 0.7-0.9.  Likely related to DKA. -Hydrate - BMP a.m  Elevated liver enzymes-ALT- 96,  AST- 48, ALP 199, total bili 2.3.  No abdominal pain.  Patient morbidly obese. -Right upper quadrant ultrasound  Chest pain, fatigue, SOB-chest pain atypical, but risk factors- HTN, morbid obesity, now diabetic .  Never smoker, no known family history of CAD. Chest x-ray negative for acute abnormality. ? Fatigue related to loose stools from metformin, Hyperglycemia  and dehydration. EKG-nonspecific T wave change in lead III only. -Troponins X 3  - ECHO -Sleep study outpatient -Continue home PPI -Ambulatory O2 sats pending acute issues  Resolution.  Mild leukocytosis- 11.6.  On chronic steroids.  UA not suggestive of infection, chest x-ray clear.  - cbc a.m  Temporal arteritis-diagnosis question by her rheumatologist. On chronic prednisone taper to 50 mg from 60 mg  3 weeks ago. With oral and vagina candidiasis, likely from steroids.  -Continue 50 mg daily steroids -Hold off on stress to steroids, as this will worsen her hyperglycemia  HTN-systolic 701 - 779 -Hold home losartan with AKI.  Hypothyroidism- s/p radioiodine therapy for Graves' disease.  Patient's endocrinologist recently  reduced Synthroid dose 137>>125. TSH- 0.2 improved from 0.1, T4- 11/27/17- normal 1.14. -Continue Synthroid 125 daily -F/u with endocrinologist outpatient  Hematuria on UA- Large hemoglobin, 11-20 RBCs. - F/u out[pt.   DVT prophylaxis: Lovenox Code Status: Full Family Communication: Daughter at bedside Disposition Plan: Per rounding team Consults called: None Admission status: inpt, Step down while on insulin drip.   Bethena Roys MD [facility] Pager [phone] From 6PM-2AM.  Otherwise please contact night-coverage [email or web address] Password [facility]  01/15/2018, 8:49 PM

## 2018-01-15 NOTE — ED Provider Notes (Signed)
[facility] DEPT Provider Note   CSN: [id number] Arrival date & time: 01/15/18  1430     History   Chief Complaint Chief Complaint  Patient presents with  . Weakness  . Fatigue    HPI Erin [name] is a 52 y.o. female.  Patient is a 52 year old female who presents with generalized weakness and fatigue.  She has a history of diabetes, Graves' disease, hypertension, fibroids and recent diagnosis of temporal arteritis.  She has been treated with high-dose prednisone at 60 mg a day since May.  She states recently they have been trying to taper her dose.  Her headaches and vision have improved.  She states that since she has had a tapering dose of prednisone she has had increased fatigue and generalized weakness.  She feels short of breath although she states she had shortness of breath for a long period time but it has been worse over the last 3 weeks since she has been tapering the prednisone.  She was also recently started on metformin.  She states the combination of the metformin and prednisone are giving her chronic diarrhea.  She has some irritation of her skin but no blood in her diarrhea.  No vomiting although she has a little nausea.  She does have oral thrush which she is been treated for and is currently on medication for.  She has a difficult time eating because of the thrush.  She does have some intermittent chest pains.  She has a cough which is mostly nonproductive.  She has some intermittent myalgias.  She has been seen by [facility] rheumatology.  She saw them today and was sent over here for IV fluids.  Note was sent with her from the rheumatologist requesting labs to be ordered including a sed rate and CRP.  Per the note, he is questioning her diagnosis of temporal arteritis.  Reportedly her headaches have resolved and she had a recent normal ophthalmologic exam.  She also had normal ESR and CRP on June 3.  I have attempted to contact the office but  it is currently closed.     Past Medical History:  Diagnosis Date  . Anemia   . Depression   . Diabetes mellitus without complication (HCC)    gestational  . Elevated blood-pressure reading without diagnosis of hypertension   . Endometriosis   . Fibroids   . GERD (gastroesophageal reflux disease)   . Graves disease   . Grief at loss of child   . Hypertension   . Hypothyroidism   . Menorrhagia   . Premature ventricular contractions    a. rare PVC by monitor 12/2016.  [name] Thyroid disease   . Uterine leiomyoma     Patient Active Problem List   Diagnosis Date Noted  . Left facial pressure and pain 11/09/2017  . Thrush 11/09/2017  . Gastroesophageal reflux disease 10/05/2017  . Temporal arteritis ([place]) 10/03/2017  . Right foot pain 07/15/2017  . Grief at loss of child 11/02/2016  . H/O gestational diabetes mellitus, not currently pregnant 09/28/2015  . Hypertension 09/28/2015  . Prediabetes 09/28/2015  . Morbid obesity ([place]) 05/17/2015  . Umbilical hernia 27/51/7001  . Chronic lower back pain 05/17/2015  . Snoring 05/17/2015  . Palpitations 05/17/2015  . ANEMIA 09/03/2007  . Hypothyroidism following radioiodine therapy 09/02/2007  . INTERSTITIAL CYSTITIS 09/01/2007  . ALLERGIC RHINITIS 08/07/2007  . [place] DISEASE, LUMBAR 08/07/2007    Past Surgical History:  Procedure Laterality Date  . APPENDECTOMY    .  ARTERY BIOPSY Left 10/25/2017   Procedure: BIOPSY TEMPORAL ARTERY;  Surgeon: Aviva Signs, MD;  Location: [facility];  Service: General;  Laterality: Left;  . CESAREAN SECTION    . CHOLECYSTECTOMY    . HERNIA REPAIR     incisional  . TUMOR REMOVAL  fibroids     OB History   None      Home Medications    Prior to Admission medications   Medication Sig Start Date End Date Taking? Authorizing Provider  ALPRAZolam Duanne Moron) 0.5 MG tablet Take 1 tablet (0.5 mg total) by mouth 2 (two) times daily as needed for anxiety. 12/13/17  Yes Burns, Claudina Lick, MD  antiseptic oral  rinse (BIOTENE) LIQD 15 mLs by Mouth Rinse route as needed for dry mouth or mouth pain.   Yes [provider]  cholecalciferol (VITAMIN D) 1000 units tablet Take 1,000 Units by mouth daily.   Yes [provider]  fluconazole (DIFLUCAN) 100 MG tablet Take 1 tablet (100 mg total) by mouth daily. 01/10/18  Yes Burns, Claudina Lick, MD  levonorgestrel (MIRENA, 52 MG,) 20 MCG/24HR IUD Mirena 20 mcg/24 hr (5 years) intrauterine device  Take 1 device by intrauterine route. 01/12/16  Yes [provider]  levothyroxine (SYNTHROID, LEVOTHROID) 137 MCG tablet TAKE 1 TABLET (137 MCG TOTAL) BY MOUTH DAILY BEFORE BREAKFAST. 12/16/17  Yes Elayne Snare, MD  losartan (COZAAR) 100 MG tablet Take 1 tablet (100 mg total) by mouth daily. 12/13/17  Yes Burns, Claudina Lick, MD  metFORMIN (GLUCOPHAGE XR) 500 MG 24 hr tablet Take 2 tablets (1,000 mg total) by mouth daily with breakfast. 01/06/18  Yes Burns, Claudina Lick, MD  pantoprazole (PROTONIX) 40 MG tablet Take 1 tablet (40 mg total) by mouth daily. Take 30 minutes prior to a meal 10/04/17  Yes Burns, Claudina Lick, MD  predniSONE (DELTASONE) 10 MG tablet Take 50 mg by mouth daily with breakfast.    Yes [provider]  blood glucose meter kit and supplies KIT Dispense based on patient and insurance preference. Use up to twice daily. 12/18/17   Binnie Rail, MD  clotrimazole (MYCELEX) 10 MG troche Take 1 tablet (10 mg total) by mouth 5 (five) times daily. 01/09/18   Binnie Rail, MD  CONTOUR TEST test strip TEST TWICE DAILY 01/15/18   Binnie Rail, MD  nystatin (MYCOSTATIN) 100000 UNIT/ML suspension Take 5 mLs (500,000 Units total) by mouth 4 (four) times daily. 01/07/18   Binnie Rail, MD  ondansetron (ZOFRAN ODT) 4 MG disintegrating tablet Take 1 tablet (4 mg total) by mouth every 8 (eight) hours as needed for nausea or vomiting. 01/06/18   Binnie Rail, MD  UNITHROID 125 MCG tablet Take 1 tablet (125 mcg total) by mouth daily before breakfast. Patient not  taking: Reported on 01/15/2018 12/04/17   Elayne Snare, MD    Family History Family History  Problem Relation Age of Onset  . Hypertension Mother   . Cancer Mother   . Hypertension Father   . Diabetes Father   . Cancer Father     Social History Social History   Tobacco Use  . Smoking status: Never Smoker  . Smokeless tobacco: Never Used  Substance Use Topics  . Alcohol use: No  . Drug use: No     Allergies   Prozac [fluoxetine hcl] and Hydrocodone   Review of Systems Review of Systems  Constitutional: Positive for fatigue. Negative for chills, diaphoresis and fever.  HENT: Negative for congestion, rhinorrhea  and sneezing.   Eyes: Negative.   Respiratory: Positive for cough and shortness of breath. Negative for chest tightness.   Cardiovascular: Positive for chest pain. Negative for leg swelling.  Gastrointestinal: Positive for abdominal pain (crampy pain with diarrhea), diarrhea and nausea. Negative for blood in stool and vomiting.  Genitourinary: Negative for difficulty urinating, flank pain, frequency and hematuria.  Musculoskeletal: Positive for myalgias. Negative for arthralgias and back pain.  Skin: Negative for rash.  Neurological: Negative for dizziness, speech difficulty, weakness, numbness and headaches.     Physical Exam Updated Vital Signs BP (!) 119/94 (BP Location: Right Arm)   Pulse (!) 116   Temp 98.5 F (36.9 C) (Oral)   Resp (!) 24   SpO2 96%   Physical Exam  Constitutional: She is oriented to person, place, and time. She appears well-developed and well-nourished.  Obese  HENT:  Head: Normocephalic and atraumatic.  Oral thrush present on tongue  Eyes: Pupils are equal, round, and reactive to light.  Neck: Normal range of motion. Neck supple.  Cardiovascular: Normal rate, regular rhythm and normal heart sounds.  Pulmonary/Chest: Effort normal and breath sounds normal. No respiratory distress. She has no wheezes. She has no rales. She exhibits  no tenderness.  Abdominal: Soft. Bowel sounds are normal. There is no tenderness. There is no rebound and no guarding.  Musculoskeletal: Normal range of motion. She exhibits no edema.  Lymphadenopathy:    She has no cervical adenopathy.  Neurological: She is alert and oriented to person, place, and time. She has normal strength. No sensory deficit.  Skin: Skin is warm and dry. No rash noted.  Psychiatric: She has a normal mood and affect.     ED Treatments / Results  Labs (all labs ordered are listed, but only abnormal results are displayed) Labs Reviewed  COMPREHENSIVE METABOLIC PANEL - Abnormal; Notable for the following components:      Result Value   CO2 20 (*)    Glucose, Bld 732 (*)    BUN 25 (*)    Creatinine, Ser 1.31 (*)    AST 48 (*)    ALT 96 (*)    Alkaline Phosphatase 199 (*)    Total Bilirubin 2.3 (*)    GFR calc non Af Amer 46 (*)    GFR calc Af Amer 54 (*)    Anion gap 20 (*)    All other components within normal limits  CBC WITH DIFFERENTIAL/PLATELET - Abnormal; Notable for the following components:   WBC 11.6 (*)    RBC 5.33 (*)    Neutro Abs 9.7 (*)    All other components within normal limits  URINALYSIS, ROUTINE W REFLEX MICROSCOPIC - Abnormal; Notable for the following components:   Specific Gravity, Urine 1.037 (*)    Glucose, UA >=500 (*)    Hgb urine dipstick LARGE (*)    Ketones, ur 20 (*)    All other components within normal limits  TSH - Abnormal; Notable for the following components:   TSH 0.288 (*)    All other components within normal limits  CK  SEDIMENTATION RATE  C-REACTIVE PROTEIN    EKG EKG Interpretation  Date/Time:  Wednesday January 15 2018 18:30:31 EDT Ventricular Rate:  116 PR Interval:    QRS Duration: 85 QT Interval:  313 QTC Calculation: 435 R Axis:   79 Text Interpretation:  Sinus tachycardia Ventricular premature complex Anterior infarct, old Borderline ST elevation, lateral leads Confirmed by Malvin Johns [phone])  on 01/15/2018 7:37:27  PM   Radiology Dg Chest 2 View  Result Date: 01/15/2018 CLINICAL DATA:  Weakness, dyspnea, fatigue for 2 weeks. EXAM: CHEST - 2 VIEW COMPARISON:  Chest x-ray dated 08/17/2016. FINDINGS: The heart size and mediastinal contours are within normal limits. Both lungs are clear. The visualized skeletal structures are unremarkable. IMPRESSION: No active cardiopulmonary disease. No evidence of pneumonia or pulmonary edema. Electronically Signed   By: Franki Cabot M.D.   On: 01/15/2018 17:33    Procedures Procedures (including critical care time)  Medications Ordered in ED Medications  sodium chloride 0.9 % bolus 500 mL (has no administration in time range)  insulin regular (NOVOLIN R,HUMULIN R) 100 Units in sodium chloride 0.9 % 100 mL (1 Units/mL) infusion (has no administration in time range)  dextrose 5 %-0.45 % sodium chloride infusion (has no administration in time range)  potassium chloride 10 mEq in 100 mL IVPB (has no administration in time range)  sodium chloride 0.9 % bolus 500 mL (0 mLs Intravenous Stopped 01/15/18 1951)       Initial Impression / Assessment and Plan / ED Course  I have reviewed the triage vital signs and the nursing notes.  Pertinent labs & imaging results that were available during my care of the patient were reviewed by me and considered in my medical decision making (see chart for details).     Patient is a 52 year old female he was recently has been treated with high-dose steroids for temporal arteritis.  She had worsening weakness over the last 3 weeks as her steroids have been tapered.  She was recently started on metformin due to elevated blood sugars which was felt to be related to the prednisone usage.  She had also ongoing diarrhea.  Today it appears that she is in DKA.  She is tachycardic.  She has no somnolence or altered mental status.  She was given IV fluids and started on insulin drip.  I do not see any source of infection.  Her  chest x-ray is clear.  She is afebrile.  She does not have any current chest pain or symptoms that would be more concerning for ACS.  I spoke with Dr. Denton Brick who will admit the patient.  CRITICAL CARE Performed by: Malvin Johns Total critical care time: 45 minutes Critical care time was exclusive of separately billable procedures and treating other patients. Critical care was necessary to treat or prevent imminent or life-threatening deterioration. Critical care was time spent personally by me on the following activities: development of treatment plan with patient and/or surrogate as well as nursing, discussions with consultants, evaluation of patient's response to treatment, examination of patient, obtaining history from patient or surrogate, ordering and performing treatments and interventions, ordering and review of laboratory studies, ordering and review of radiographic studies, pulse oximetry and re-evaluation of patient's condition.   Final Clinical Impressions(s) / ED Diagnoses   Final diagnoses:  Weakness  Diabetic ketoacidosis without coma associated with diabetes mellitus due to underlying condition [facility])    ED Discharge Orders    None       Malvin Johns, MD 01/15/18 2016

## 2018-01-15 NOTE — ED Notes (Signed)
. ED TO INPATIENT HANDOFF REPORT  Name/Age/Gender Erin [name] 52 y.o. female  Code Status   Home/SNF/Other Home  Chief Complaint not able to stand or walk / weak   Level of Care/Admitting Diagnosis ED Disposition    ED Disposition Condition [facility]: [place] [100102]  Level of Care: Stepdown [14]  Admit to SDU based on following criteria: Other see comments  Comments: DKA  Diagnosis: DKA (diabetic ketoacidoses) [facility]) [009233]  Admitting Physician: Bethena Roys [phone]  Attending Physician: Bethena Roys [phone]  Estimated length of stay: past midnight tomorrow  Certification:: I certify this patient will need inpatient services for at least 2 midnights  PT Class (Do Not Modify): Inpatient [101]  PT Acc Code (Do Not Modify): Private [1]       Medical History Past Medical History:  Diagnosis Date  . Anemia   . Depression   . Diabetes mellitus without complication (HCC)    gestational  . Elevated blood-pressure reading without diagnosis of hypertension   . Endometriosis   . Fibroids   . GERD (gastroesophageal reflux disease)   . Graves disease   . Grief at loss of child   . Hypertension   . Hypothyroidism   . Menorrhagia   . Premature ventricular contractions    a. rare PVC by monitor 12/2016.  [name] Thyroid disease   . Uterine leiomyoma     Allergies Allergies  Allergen Reactions  . Prozac [Fluoxetine Hcl] Nausea Only  . Hydrocodone Hives, Itching and Rash    On thighs     IV Location/Drains/Wounds Patient Lines/Drains/Airways Status   Active Line/Drains/Airways    Name:   Placement date:   Placement time:   Site:   Days:   Peripheral IV 01/15/18 Left Antecubital   01/15/18    1911    Antecubital   less than 1   Peripheral IV 01/15/18 Right;Anterior Forearm   01/15/18    2016    Forearm   less than 1   Peripheral IV 01/15/18 Right;Lateral Forearm   01/15/18    2017    Forearm   less than 1           Labs/Imaging Results for orders placed or performed during the hospital encounter of 01/15/18 (from the past 48 hour(s))  Comprehensive metabolic panel     Status: Abnormal   Collection Time: 01/15/18  6:15 PM  Result Value Ref Range   Sodium 140 135 - 145 mmol/L   Potassium 4.9 3.5 - 5.1 mmol/L   Chloride 100 98 - 111 mmol/L   CO2 20 (L) 22 - 32 mmol/L   Glucose, Bld 732 (HH) 70 - 99 mg/dL    Comment: CRITICAL RESULT CALLED TO, READ BACK BY AND VERIFIED WITH: Annabell Howells [id number] @ 1909 BY J SCOTTON    BUN 25 (H) 6 - 20 mg/dL   Creatinine, Ser 1.31 (H) 0.44 - 1.00 mg/dL   Calcium 9.9 8.9 - 10.3 mg/dL   Total Protein 7.3 6.5 - 8.1 g/dL   Albumin 4.0 3.5 - 5.0 g/dL   AST 48 (H) 15 - 41 U/L   ALT 96 (H) 0 - 44 U/L   Alkaline Phosphatase 199 (H) 38 - 126 U/L   Total Bilirubin 2.3 (H) 0.3 - 1.2 mg/dL   GFR calc non Af Amer 46 (L) >60 mL/min   GFR calc Af Amer 54 (L) >60 mL/min    Comment: (NOTE) The eGFR  has been calculated using the CKD EPI equation. This calculation has not been validated in all clinical situations. eGFR's persistently <60 mL/min signify possible Chronic Kidney Disease.    Anion gap 20 (H) 5 - 15    Comment: Performed at [facility], [place] [address], [place], [place] [address]  CBC with Differential     Status: Abnormal   Collection Time: 01/15/18  6:15 PM  Result Value Ref Range   WBC 11.6 (H) 4.0 - 10.5 K/uL   RBC 5.33 (H) 3.87 - 5.11 MIL/uL   Hemoglobin 14.6 12.0 - 15.0 g/dL   HCT 45.4 36.0 - 46.0 %   MCV 85.2 78.0 - 100.0 fL   MCH 27.4 26.0 - 34.0 pg   MCHC 32.2 30.0 - 36.0 g/dL   RDW 15.1 11.5 - 15.5 %   Platelets 199 150 - 400 K/uL   Neutrophils Relative % 84 %   Neutro Abs 9.7 (H) 1.7 - 7.7 K/uL   Lymphocytes Relative 14 %   Lymphs Abs 1.6 0.7 - 4.0 K/uL   Monocytes Relative 2 %   Monocytes Absolute 0.3 0.1 - 1.0 K/uL   Eosinophils Relative 0 %   Eosinophils Absolute 0.0 0.0 - 0.7 K/uL   Basophils  Relative 0 %   Basophils Absolute 0.0 0.0 - 0.1 K/uL    Comment: Performed at [facility], [place] [address], [place], [place] [address]  Urinalysis, Routine w reflex microscopic     Status: Abnormal   Collection Time: 01/15/18  6:15 PM  Result Value Ref Range   Color, Urine YELLOW YELLOW   APPearance CLEAR CLEAR   Specific Gravity, Urine 1.037 (H) 1.005 - 1.030   pH 5.0 5.0 - 8.0   Glucose, UA >=500 (A) NEGATIVE mg/dL   Hgb urine dipstick LARGE (A) NEGATIVE   Bilirubin Urine NEGATIVE NEGATIVE   Ketones, ur 20 (A) NEGATIVE mg/dL   Protein, ur NEGATIVE NEGATIVE mg/dL   Nitrite NEGATIVE NEGATIVE   Leukocytes, UA NEGATIVE NEGATIVE   RBC / HPF 11-20 0 - 5 RBC/hpf   WBC, UA 0-5 0 - 5 WBC/hpf   Bacteria, UA NONE SEEN NONE SEEN   Squamous Epithelial / LPF 0-5 0 - 5    Comment: Performed at [facility], [place] [address], [place], [place] [address]  CK     Status: None   Collection Time: 01/15/18  6:15 PM  Result Value Ref Range   Total CK 61 38 - 234 U/L    Comment: Performed at [facility], [place] [address], [place], [place] [address]  Sedimentation rate     Status: None   Collection Time: 01/15/18  6:15 PM  Result Value Ref Range   Sed Rate 8 0 - 22 mm/hr    Comment: Performed at [facility], [place] [address] [place], [place] [address]  TSH     Status: Abnormal   Collection Time: 01/15/18  6:15 PM  Result Value Ref Range   TSH 0.288 (L) 0.350 - 4.500 uIU/mL    Comment: Performed by a 3rd Generation assay with a functional sensitivity of <=0.01 uIU/mL. Performed at [facility], [place] [address] [place], [place] [address]   CBG monitoring, ED     Status: Abnormal   Collection Time: 01/15/18  9:18 PM  Result Value Ref Range   Glucose-Capillary 574 (HH) 70 - 99 mg/dL   Comment 1 Notify RN    Dg Chest  2 View  Result Date: 01/15/2018 CLINICAL DATA:  Weakness, dyspnea, fatigue for 2 weeks.  EXAM: CHEST - 2 VIEW COMPARISON:  Chest x-ray dated 08/17/2016. FINDINGS: The heart size and mediastinal contours are within normal limits. Both lungs are clear. The visualized skeletal structures are unremarkable. IMPRESSION: No active cardiopulmonary disease. No evidence of pneumonia or pulmonary edema. Electronically Signed   By: Franki Cabot M.D.   On: 01/15/2018 17:33    Pending Labs Unresulted Labs (From admission, onward)   Start     Ordered   01/15/18 2359  Troponin I (q 6hr x 3)  Now then every 6 hours,   R     01/15/18 2048   01/15/18 2049  Hemoglobin A1c  Add-on,   R     01/15/18 2048   01/15/18 2048  Troponin I  Add-on,   R     01/15/18 2048   01/15/18 1656  C-reactive protein  Once,   STAT     01/15/18 1657   Signed and Held  HIV antibody (Routine Testing)  Once,   R     Signed and Held   Signed and Held  Basic metabolic panel  Once,   R     Signed and Held   Signed and Held  Basic metabolic panel  Tomorrow morning,   R     Signed and Held   Signed and Held  CBC  Tomorrow morning,   R     Signed and Held      Vitals/Pain Today's Vitals   01/15/18 1458 01/15/18 1914 01/15/18 2058  BP: (!) 116/94 (!) 119/94 (!) 122/92  Pulse: (!) 127 (!) 116 (!) 122  Resp: 18 (!) 24 16  Temp: 98.6 F (37 C) 98.5 F (36.9 C) 98.5 F (36.9 C)  TempSrc: Oral Oral Oral  SpO2: 92% 96% 100%  PainSc:   0-No pain    Isolation Precautions No active isolations  Medications Medications  insulin regular (NOVOLIN R,HUMULIN R) 100 Units in sodium chloride 0.9 % 100 mL (1 Units/mL) infusion (10.3 Units/hr Intravenous Rate/Dose Change 01/15/18 2121)  dextrose 5 %-0.45 % sodium chloride infusion ( Intravenous Hold 01/15/18 2114)  potassium chloride 10 mEq in 100 mL IVPB (10 mEq Intravenous New Bag/Given 01/15/18 2114)  sodium chloride 0.9 % bolus 1,000 mL (1,000 mLs Intravenous New Bag/Given 01/15/18 2113)  sodium chloride 0.9 % bolus 500 mL (0 mLs Intravenous Stopped 01/15/18 1951)  sodium  chloride 0.9 % bolus 500 mL (500 mLs Intravenous New Bag/Given 01/15/18 2055)    Mobility walks with device

## 2018-01-15 NOTE — ED Notes (Signed)
cbg 574

## 2018-01-15 NOTE — ED Notes (Signed)
Pt on toilet med delay

## 2018-01-16 ENCOUNTER — Inpatient Hospital Stay (HOSPITAL_COMMUNITY): Payer: BLUE CROSS/BLUE SHIELD

## 2018-01-16 ENCOUNTER — Other Ambulatory Visit: Payer: Self-pay

## 2018-01-16 DIAGNOSIS — I10 Essential (primary) hypertension: Secondary | ICD-10-CM

## 2018-01-16 DIAGNOSIS — B370 Candidal stomatitis: Secondary | ICD-10-CM

## 2018-01-16 DIAGNOSIS — B3731 Acute candidiasis of vulva and vagina: Secondary | ICD-10-CM | POA: Diagnosis present

## 2018-01-16 DIAGNOSIS — E119 Type 2 diabetes mellitus without complications: Secondary | ICD-10-CM

## 2018-01-16 DIAGNOSIS — M316 Other giant cell arteritis: Secondary | ICD-10-CM

## 2018-01-16 DIAGNOSIS — K219 Gastro-esophageal reflux disease without esophagitis: Secondary | ICD-10-CM

## 2018-01-16 DIAGNOSIS — B373 Candidiasis of vulva and vagina: Secondary | ICD-10-CM

## 2018-01-16 DIAGNOSIS — E1100 Type 2 diabetes mellitus with hyperosmolarity without nonketotic hyperglycemic-hyperosmolar coma (NKHHC): Principal | ICD-10-CM

## 2018-01-16 DIAGNOSIS — R002 Palpitations: Secondary | ICD-10-CM

## 2018-01-16 DIAGNOSIS — I5030 Unspecified diastolic (congestive) heart failure: Secondary | ICD-10-CM

## 2018-01-16 DIAGNOSIS — E890 Postprocedural hypothyroidism: Secondary | ICD-10-CM

## 2018-01-16 HISTORY — DX: Candidal stomatitis: B37.0

## 2018-01-16 LAB — BASIC METABOLIC PANEL
Anion gap: 11 (ref 5–15)
Anion gap: 12 (ref 5–15)
Anion gap: 13 (ref 5–15)
Anion gap: 7 (ref 5–15)
BUN: 18 mg/dL (ref 6–20)
BUN: 21 mg/dL — ABNORMAL HIGH (ref 6–20)
BUN: 22 mg/dL — ABNORMAL HIGH (ref 6–20)
BUN: 23 mg/dL — ABNORMAL HIGH (ref 6–20)
CO2: 23 mmol/L (ref 22–32)
CO2: 24 mmol/L (ref 22–32)
CO2: 24 mmol/L (ref 22–32)
CO2: 25 mmol/L (ref 22–32)
Calcium: 8.4 mg/dL — ABNORMAL LOW (ref 8.9–10.3)
Calcium: 8.9 mg/dL (ref 8.9–10.3)
Calcium: 9.1 mg/dL (ref 8.9–10.3)
Calcium: 9.6 mg/dL (ref 8.9–10.3)
Chloride: 106 mmol/L (ref 98–111)
Chloride: 109 mmol/L (ref 98–111)
Chloride: 109 mmol/L (ref 98–111)
Chloride: 111 mmol/L (ref 98–111)
Creatinine, Ser: 0.73 mg/dL (ref 0.44–1.00)
Creatinine, Ser: 0.91 mg/dL (ref 0.44–1.00)
Creatinine, Ser: 0.99 mg/dL (ref 0.44–1.00)
Creatinine, Ser: 1.1 mg/dL — ABNORMAL HIGH (ref 0.44–1.00)
GFR calc Af Amer: >60 mL/min (ref >60)
GFR calc Af Amer: >60 mL/min (ref >60)
GFR calc Af Amer: >60 mL/min (ref >60)
GFR calc Af Amer: >60 mL/min (ref >60)
GFR calc non Af Amer: 57 mL/min — ABNORMAL LOW (ref >60)
GFR calc non Af Amer: >60 mL/min (ref >60)
GFR calc non Af Amer: >60 mL/min (ref >60)
GFR calc non Af Amer: >60 mL/min (ref >60)
Glucose, Bld: 238 mg/dL — ABNORMAL HIGH (ref 70–99)
Glucose, Bld: 317 mg/dL — ABNORMAL HIGH (ref 70–99)
Glucose, Bld: 318 mg/dL — ABNORMAL HIGH (ref 70–99)
Glucose, Bld: 542 mg/dL (ref 70–99)
Potassium: 3.4 mmol/L — ABNORMAL LOW (ref 3.5–5.1)
Potassium: 3.8 mmol/L (ref 3.5–5.1)
Potassium: 4.1 mmol/L (ref 3.5–5.1)
Potassium: 4.4 mmol/L (ref 3.5–5.1)
Sodium: 140 mmol/L (ref 135–145)
Sodium: 141 mmol/L (ref 135–145)
Sodium: 145 mmol/L (ref 135–145)
Sodium: 148 mmol/L — ABNORMAL HIGH (ref 135–145)

## 2018-01-16 LAB — CBC
HCT: 44 % (ref 36.0–46.0)
Hemoglobin: 14.2 g/dL (ref 12.0–15.0)
MCH: 27.3 pg (ref 26.0–34.0)
MCHC: 32.3 g/dL (ref 30.0–36.0)
MCV: 84.6 fL (ref 78.0–100.0)
Platelets: 176 10*3/uL (ref 150–400)
RBC: 5.2 MIL/uL — ABNORMAL HIGH (ref 3.87–5.11)
RDW: 15.2 % (ref 11.5–15.5)
WBC: 13.1 10*3/uL — ABNORMAL HIGH (ref 4.0–10.5)

## 2018-01-16 LAB — TROPONIN I
Troponin I: <0.03 ng/mL (ref <0.03)
Troponin I: <0.03 ng/mL (ref <0.03)

## 2018-01-16 LAB — HEMOGLOBIN A1C
Hgb A1c MFr Bld: 12.7 % — ABNORMAL HIGH (ref 4.8–5.6)
Mean Plasma Glucose: 317.79 mg/dL

## 2018-01-16 LAB — GLUCOSE, CAPILLARY
Glucose-Capillary: 191 mg/dL — ABNORMAL HIGH (ref 70–99)
Glucose-Capillary: 208 mg/dL — ABNORMAL HIGH (ref 70–99)
Glucose-Capillary: 238 mg/dL — ABNORMAL HIGH (ref 70–99)
Glucose-Capillary: 246 mg/dL — ABNORMAL HIGH (ref 70–99)
Glucose-Capillary: 270 mg/dL — ABNORMAL HIGH (ref 70–99)
Glucose-Capillary: 283 mg/dL — ABNORMAL HIGH (ref 70–99)
Glucose-Capillary: 363 mg/dL — ABNORMAL HIGH (ref 70–99)
Glucose-Capillary: 372 mg/dL — ABNORMAL HIGH (ref 70–99)
Glucose-Capillary: 428 mg/dL — ABNORMAL HIGH (ref 70–99)
Glucose-Capillary: 429 mg/dL — ABNORMAL HIGH (ref 70–99)
Glucose-Capillary: 490 mg/dL — ABNORMAL HIGH (ref 70–99)

## 2018-01-16 LAB — ECHOCARDIOGRAM COMPLETE
Height: 65 in
Weight: 5082.93 oz

## 2018-01-16 LAB — HIV ANTIBODY (ROUTINE TESTING W REFLEX): HIV Screen 4th Generation wRfx: NONREACTIVE

## 2018-01-16 LAB — MRSA PCR SCREENING: MRSA by PCR: NEGATIVE

## 2018-01-16 MED ORDER — INSULIN ASPART 100 UNIT/ML ~~LOC~~ SOLN
0.0000 [IU] | Freq: Three times a day (TID) | SUBCUTANEOUS | Status: DC
  Administered 2018-01-16: 3 [IU] via SUBCUTANEOUS
  Administered 2018-01-16: 9 [IU] via SUBCUTANEOUS

## 2018-01-16 MED ORDER — INSULIN ASPART 100 UNIT/ML ~~LOC~~ SOLN: 0.0000 [IU] | Freq: Three times a day (TID) | SUBCUTANEOUS | Status: DC

## 2018-01-16 MED ORDER — FLUCONAZOLE IN SODIUM CHLORIDE 200-0.9 MG/100ML-% IV SOLN
200.0000 mg | Freq: Once | INTRAVENOUS | Status: AC
  Administered 2018-01-16: 200 mg via INTRAVENOUS
  Filled 2018-01-16: qty 100

## 2018-01-16 MED ORDER — CLOTRIMAZOLE 1 % VA CREA
1.0000 | TOPICAL_CREAM | Freq: Every day | VAGINAL | Status: DC
  Filled 2018-01-16: qty 45

## 2018-01-16 MED ORDER — INSULIN ASPART 100 UNIT/ML ~~LOC~~ SOLN
0.0000 [IU] | Freq: Every day | SUBCUTANEOUS | Status: DC
  Administered 2018-01-18: 4 [IU] via SUBCUTANEOUS
  Administered 2018-01-19: 3 [IU] via SUBCUTANEOUS

## 2018-01-16 MED ORDER — SODIUM CHLORIDE 0.45 % IV SOLN
INTRAVENOUS | Status: DC
  Administered 2018-01-17 – 2018-01-18 (×3): via INTRAVENOUS

## 2018-01-16 MED ORDER — SODIUM CHLORIDE 0.45 % IV SOLN
INTRAVENOUS | Status: DC
  Administered 2018-01-16: 09:00:00 via INTRAVENOUS

## 2018-01-16 MED ORDER — FLUCONAZOLE 100 MG PO TABS
100.0000 mg | ORAL_TABLET | Freq: Every day | ORAL | Status: DC
  Administered 2018-01-17 – 2018-01-20 (×4): 100 mg via ORAL
  Filled 2018-01-16 (×4): qty 1

## 2018-01-16 MED ORDER — INSULIN GLARGINE 100 UNIT/ML ~~LOC~~ SOLN
20.0000 [IU] | Freq: Every day | SUBCUTANEOUS | Status: DC
  Administered 2018-01-16: 20 [IU] via SUBCUTANEOUS
  Filled 2018-01-16: qty 0.2

## 2018-01-16 MED ORDER — INSULIN GLARGINE 100 UNIT/ML ~~LOC~~ SOLN
25.0000 [IU] | Freq: Every day | SUBCUTANEOUS | Status: DC
  Filled 2018-01-16: qty 0.25

## 2018-01-16 MED ORDER — INSULIN ASPART 100 UNIT/ML ~~LOC~~ SOLN
0.0000 [IU] | SUBCUTANEOUS | Status: DC
  Administered 2018-01-16: 20 [IU] via SUBCUTANEOUS
  Administered 2018-01-16: 11 [IU] via SUBCUTANEOUS
  Administered 2018-01-17 (×2): 15 [IU] via SUBCUTANEOUS
  Administered 2018-01-17: 3 [IU] via SUBCUTANEOUS
  Administered 2018-01-17: 11 [IU] via SUBCUTANEOUS
  Administered 2018-01-18 (×3): 4 [IU] via SUBCUTANEOUS

## 2018-01-16 MED ORDER — INSULIN ASPART 100 UNIT/ML ~~LOC~~ SOLN
25.0000 [IU] | Freq: Once | SUBCUTANEOUS | Status: AC
  Administered 2018-01-16: 25 [IU] via SUBCUTANEOUS

## 2018-01-16 MED ORDER — LIVING WELL WITH DIABETES BOOK
Freq: Once | Status: AC
  Administered 2018-01-16: 12:00:00
  Filled 2018-01-16: qty 1

## 2018-01-16 MED ORDER — LOSARTAN POTASSIUM 50 MG PO TABS
100.0000 mg | ORAL_TABLET | Freq: Every day | ORAL | Status: DC
  Administered 2018-01-16: 100 mg via ORAL
  Filled 2018-01-16: qty 2

## 2018-01-16 MED ORDER — INSULIN ASPART 100 UNIT/ML ~~LOC~~ SOLN
4.0000 [IU] | Freq: Three times a day (TID) | SUBCUTANEOUS | Status: DC
  Administered 2018-01-16: 4 [IU] via SUBCUTANEOUS

## 2018-01-16 MED ORDER — SODIUM CHLORIDE 0.45 % IV SOLN
INTRAVENOUS | Status: DC
  Administered 2018-01-16: 02:00:00 via INTRAVENOUS

## 2018-01-16 MED ORDER — METOPROLOL TARTRATE 25 MG PO TABS
12.5000 mg | ORAL_TABLET | Freq: Two times a day (BID) | ORAL | Status: DC
  Administered 2018-01-16 – 2018-01-17 (×3): 12.5 mg via ORAL
  Filled 2018-01-16 (×3): qty 1

## 2018-01-16 MED ORDER — SODIUM CHLORIDE 0.9 % IV BOLUS
2000.0000 mL | Freq: Once | INTRAVENOUS | Status: AC
  Administered 2018-01-16: 2000 mL via INTRAVENOUS

## 2018-01-16 MED ORDER — INSULIN STARTER KIT- PEN NEEDLES (ENGLISH)
1.0000 | Freq: Once | Status: AC
  Administered 2018-01-16: 1
  Filled 2018-01-16: qty 1

## 2018-01-16 NOTE — Progress Notes (Signed)
Pt CBG is 490. RN put in for stat lab to draw glucose for verification and made MD aware though text page. Waiting for call back/orders at this time.

## 2018-01-16 NOTE — Progress Notes (Signed)
PROGRESS NOTE    Erin [name]  [id number] DOB: 02-16-1966 DOA: 01/15/2018 PCP: Binnie Rail, MD    Brief Narrative:  Patient is a pleasant 52 year old female history of temporal arteritis, gastroesophageal reflux disease, diabetes, hypothyroidism following radioiodine therapy, morbid obesity presented to the ED with shortness of breath times several months worse over the past 3 weeks when prednisone taper had begun.  Shortness of breath worse on mild exertion with ambulation and even on standing.  Patient also noted to have lower extremity swelling, redness.  No cough.  Patient also did complain of some intermittent midsternal chest pain last episode 2 days prior to admission.  On admission noted to be tachycardic, systolic blood pressure of 116, glucose of 732, anion gap of 20, bicarb of 20.  Patient admitted for DKA and placed on the glucose stabilizer.   Assessment & Plan:   Principal Problem:   Hyperosmolar non-ketotic state in patient with type 2 diabetes mellitus ([place]) Active Problems:   DKA (diabetic ketoacidoses) ([place])   Hypothyroidism following radioiodine therapy   Morbid obesity (HCC)   Palpitations   Hypertension   Temporal arteritis ([place])   Gastroesophageal reflux disease   Oral thrush   Vaginal candidiasis   Diabetes mellitus type 2, uncomplicated ([place])  #1 hyperosmolar nonketotic state in type 2 diabetes mellitus/newly diagnosed diabetes mellitus Patient presenting with hyperosmolar nonketotic hyperglycemia and newly diagnosed diabetes mellitus recently in June 2019.  CBG on admission was 732 with a anion gap of 20 with a bicarb of 20.  Questionable etiology.  Cardiac enzymes negative x3.  Patient with some atypical chest pain.  No signs or symptoms of infection.  Urinalysis nitrite negative leukocytes negative.  Patient admitted placed on the glucose stabilizer anion gap is closed.  Hemoglobin A1c 12.7.  Patient transitioned to Lantus 20 units daily.  Change  sliding scale to resistant sliding scale as patient on chronic steroids.  Place on meal coverage NovoLog 4 units 3 times daily.  Check CBGs every 4 hours for the next 24 hours and uptitrate Lantus as needed.  Diabetes coordinator following.  Will likely need insulin on discharge.  Continue IV fluids.  Follow.  2.  Dehydration IV fluids.  3.  Oral thrush/vaginal candidiasis Likely secondary to oral steroid treatment.  Continue oral nystatin swish and swallow.  Give IV fluconazole 200 mg x 1.  Placed on oral daily Diflucan starting 01/17/2018.  Clotrimazole vaginal cream nightly x7 days.  Follow.  4.  Gastroesophageal reflux disease PPI.  5.  Atypical chest pain Patient with complaints of midsternal nonradiating sharp chest pain which she felt was likely secondary to gastroesophageal reflux disease.  Patient with oral thrush.  Patient on chronic prednisone.  Cardiac enzymes negative x3.  2D echo pending.  If 2D echo is abnormal will consult with cardiology for further evaluation and management.  Concern for possible esophageal candidiasis versus reflux.  Continue oral nystatin swish and swallow.  Continue Diflucan.  Continue PPI.  Follow.  6.  Loose stools Likely secondary to metformin.  Improving.  Follow.  7.  Acute kidney injury Creatinine on admission was 1.3.  Improved with hydration.  Monitor closely.  8.  Hypertension Systolic blood pressure noted to be in the 160s today.  Resume home regimen of Cozaar and monitor renal function closely.  9.  Temporal arteritis Per admission H&P noted that the diagnosis was in question by rheumatologist.  Patient on chronic prednisone which we will continue and defer further taper to rheumatologist.  Follow.  10.  Elevated liver enzymes Repeat LFTs in the morning.  Right upper quadrant ultrasound status post cholecystectomy with hepatic steatosis.  Follow LFTs.  Check a fasting lipid panel may need to be started on a statin.  11.  Hypothyroidism  status post radioiodine therapy for Graves' disease Patient's endocrinologist recently reduce Synthroid dose from 1 37-1 25 MCG's.  TSH 0.2 improved from 0.1.  T4 on 11/27/2017 was within normal limits at 1.14.  Continue current dose of Synthroid.  Outpatient follow-up with urologist.  12.  Palpitations Questionable etiology.  May be secondary to dehydration.  Patient status post radioiodine therapy for Graves' disease.  Will monitor palpitations with hydration.  If no significant improvement will need to start on a beta-blocker.     DVT prophylaxis: Lovenox Code Status: Full Family Communication: Updated patient and family at bedside. Disposition Plan: Remain in stepdown unit.  Likely home when clinically stable and medically improved.   Consultants:   None  Procedures:   CXR 01/15/2018  Right upper quadrant ultrasound 01/15/2018  Antimicrobials:   None   Subjective: Patient sitting up in bed.  Denies any current chest pain.  Denies any shortness of breath.  Feeling better than on admission.  States had one loose stool yesterday night.  Objective: Vitals:   01/16/18 1200 01/16/18 1220 01/16/18 1640 01/16/18 1648  BP: 133/74  (!) 148/109 (!) 148/96  Pulse: (!) 104     Resp: 18  20   Temp:  98.4 F (36.9 C)    TempSrc:  Oral    SpO2: 98%     Weight:      Height:        Intake/Output Summary (Last 24 hours) at 01/16/2018 1653 Last data filed at 01/16/2018 0600 Gross per 24 hour  Intake 858.14 ml  Output -  Net 858.14 ml   Filed Weights   01/16/18 0000  Weight: (!) 144.1 kg (317 lb 10.9 oz)    Examination:  General exam: Appears calm and comfortable  Respiratory system: Lungs clear to auscultation bilaterally.  No wheezes, no crackles, no rhonchi.  Respiratory effort normal. Cardiovascular system: Tachycardia.  No JVD.  No murmurs, no rubs, no rhonchi.  No lower extremity edema.  Gastrointestinal system: Abdomen is obese, nondistended, soft and nontender. No  organomegaly or masses felt. Normal bowel sounds heard. Central nervous system: Alert and oriented. No focal neurological deficits. Extremities: Symmetric 5 x 5 power. Skin: No rashes, lesions or ulcers Psychiatry: Judgement and insight appear normal. Mood & affect appropriate.     Data Reviewed: I have personally reviewed following labs and imaging studies  CBC: Recent Labs  Lab 01/15/18 1815 01/16/18 0400  WBC 11.6* 13.1*  NEUTROABS 9.7*  --   HGB 14.6 14.2  HCT 45.4 44.0  MCV 85.2 84.6  PLT 199 371   Basic Metabolic Panel: Recent Labs  Lab 01/15/18 1815 01/15/18 2348 01/16/18 0400  NA 140 148* 145  K 4.9 3.8 4.4  CL 100 111 109  CO2 20* 24 25  GLUCOSE 732* 317* 238*  BUN 25* 23* 21*  CREATININE 1.31* 1.10* 0.99  CALCIUM 9.9 9.6 9.1   GFR: Estimated Creatinine Clearance: 97.4 mL/min (by C-G formula based on SCr of 0.99 mg/dL). Liver Function Tests: Recent Labs  Lab 01/15/18 1815  AST 48*  ALT 96*  ALKPHOS 199*  BILITOT 2.3*  PROT 7.3  ALBUMIN 4.0   No results for input(s): LIPASE, AMYLASE in the last 168 hours. No results  for input(s): AMMONIA in the last 168 hours. Coagulation Profile: No results for input(s): INR, PROTIME in the last 168 hours. Cardiac Enzymes: Recent Labs  Lab 01/15/18 1815 01/15/18 2155 01/16/18 0400 01/16/18 0950  CKTOTAL 61  --   --   --   TROPONINI  --  <0.03 <0.03 <0.03   BNP (last 3 results) No results for input(s): PROBNP in the last 8760 hours. HbA1C: Recent Labs    01/15/18 2155  HGBA1C 12.7*   CBG: Recent Labs  Lab 01/16/18 0330 01/16/18 0505 01/16/18 0823 01/16/18 1212 01/16/18 1634  GLUCAP 246* 191* 208* 372* 490*   Lipid Profile: No results for input(s): CHOL, HDL, LDLCALC, TRIG, CHOLHDL, LDLDIRECT in the last 72 hours. Thyroid Function Tests: Recent Labs    01/15/18 1815  TSH 0.288*   Anemia Panel: No results for input(s): VITAMINB12, FOLATE, FERRITIN, TIBC, IRON, RETICCTPCT in the last 72  hours. Sepsis Labs: No results for input(s): PROCALCITON, LATICACIDVEN in the last 168 hours.  Recent Results (from the past 240 hour(s))  MRSA PCR Screening     Status: None   Collection Time: 01/16/18  7:42 AM  Result Value Ref Range Status   MRSA by PCR NEGATIVE NEGATIVE Final    Comment:        The GeneXpert MRSA Assay (FDA approved for NASAL specimens only), is one component of a comprehensive MRSA colonization surveillance program. It is not intended to diagnose MRSA infection nor to guide or monitor treatment for MRSA infections. Performed at [facility], [place] [address], [place], [place] [address]          Radiology Studies: Dg Chest 2 View  Result Date: 01/15/2018 CLINICAL DATA:  Weakness, dyspnea, fatigue for 2 weeks. EXAM: CHEST - 2 VIEW COMPARISON:  Chest x-ray dated 08/17/2016. FINDINGS: The heart size and mediastinal contours are within normal limits. Both lungs are clear. The visualized skeletal structures are unremarkable. IMPRESSION: No active cardiopulmonary disease. No evidence of pneumonia or pulmonary edema. Electronically Signed   By: Franki Cabot M.D.   On: 01/15/2018 17:33   US Abdomen Limited Ruq  Result Date: 01/15/2018 CLINICAL DATA:  Transaminitis EXAM: ULTRASOUND ABDOMEN LIMITED RIGHT UPPER QUADRANT COMPARISON:  None. FINDINGS: Gallbladder: Gallbladder is surgically absent. The gallbladder fossa was imaged and is unremarkable. Common bile duct: Diameter: 1.8 mm Liver: Increased hepatic echogenicity consistent with steatosis. Portal vein is patent on color Doppler imaging with normal direction of blood flow towards the liver. IMPRESSION: 1. Status post cholecystectomy. 2. Hepatic steatosis. Electronically Signed   By: Ulyses Jarred M.D.   On: 01/15/2018 23:59        Scheduled Meds: . clotrimazole  1 Applicatorful Vaginal QHS  . enoxaparin (LOVENOX) injection  70 mg Subcutaneous Daily  . [START ON 01/17/2018] fluconazole  100  mg Oral Daily  . insulin aspart  0-20 Units Subcutaneous TID WC  . insulin aspart  0-5 Units Subcutaneous QHS  . insulin aspart  25 Units Subcutaneous Once  . insulin aspart  4 Units Subcutaneous TID WC  . insulin glargine  25 Units Subcutaneous QHS  . levothyroxine  125 mcg Oral QAC breakfast  . losartan  100 mg Oral Daily  . nystatin  5 mL Oral QID  . pantoprazole  40 mg Oral Q breakfast  . predniSONE  50 mg Oral Q breakfast   Continuous Infusions: . sodium chloride 125 mL/hr at 01/16/18 0857     LOS: 1 day    Time spent:  40 minutes    Irine Seal, MD [facility] Pager [phone]  If 7PM-7AM, please contact night-coverage [email or web address] Password [email or web address] 01/16/2018, 4:53 PM

## 2018-01-16 NOTE — Progress Notes (Signed)
CRITICAL VALUE STICKER  CRITICAL VALUE: Glucose 542  DATE & TIME NOTIFIED: 01/16/2018 1814  MD NOTIFIED: [place] Silos  TIME OF NOTIFICATION: 01/16/2018 1820

## 2018-01-16 NOTE — Progress Notes (Signed)
Inpatient Diabetes Program Recommendations  AACE/ADA: New Consensus Statement on Inpatient Glycemic Control (2015)  Target Ranges:  Prepandial:   less than 140 mg/dL      Peak postprandial:   less than 180 mg/dL (1-2 hours)      Critically ill patients:  140 - 180 mg/dL   Results for Erin [name], Erin [name] (MRN [id number]) as of 01/16/2018 07:44  Ref. Range 01/15/2018 18:15  Sodium Latest Ref Range: 135 - 145 mmol/L 140  Potassium Latest Ref Range: 3.5 - 5.1 mmol/L 4.9  Chloride Latest Ref Range: 98 - 111 mmol/L 100  CO2 Latest Ref Range: 22 - 32 mmol/L 20 (L)  Glucose Latest Ref Range: 70 - 99 mg/dL 732 (HH)  BUN Latest Ref Range: 6 - 20 mg/dL 25 (H)  Creatinine Latest Ref Range: 0.44 - 1.00 mg/dL 1.31 (H)  Calcium Latest Ref Range: 8.9 - 10.3 mg/dL 9.9  Anion gap Latest Ref Range: 5 - 15  20 (H)    Results for Erin [name], Erin [name] (MRN [id number]) as of 01/16/2018 07:44  Ref. Range 06/11/2017 15:54 12/13/2017 16:19 01/15/2018 21:55  Hemoglobin A1C Latest Ref Range: 4.8 - 5.6 % 6.3 8.9 (H) 12.7 (H)  Average glucose 317 mg/dl     Admit with: DKA  History: DM, Temporal Arteritis (Chronic Prednisone at home)  Home DM Meds: Metformin 1000 mg daily  Current Insulin Orders: Lantus 20 units daily      Novolog Sensitive Correction Scale/ SSI (0-9 units) TID AC        Note patient transitioned off the IV Insulin drip this AM.  Lantus 20 units given at 2am.  Current A1c of 12.7% shows significant poor glucose control at home.  May be worsened from the steroids she takes.  Likely needs Insulin therapy for home.  Looks like she was just diagnosed with diabetes at her last PCP visit on 12/13/2017.  Started on Metformin at that visit as well, but looks like patient is having serious issues with diarrhea per PCP notes.  Plan to speak with pt today about the possibility of going home on insulin.     --Will follow patient during hospitalization--  Wyn Quaker RN, MSN,  CDE Diabetes Coordinator Inpatient Glycemic Control Team Team Pager: [phone] (8a-5p)

## 2018-01-16 NOTE — Progress Notes (Signed)
Brief note: Lovenox  Wt=144 Kg, CrCl~87 ml/min, BMI=52  Rx adjusted Lovenox to 70 mg  Daily (~ 0.5 mg/kg) in with BMI>30  Thanks Dorrene German 01/16/2018 1:20 AM

## 2018-01-16 NOTE — Progress Notes (Signed)
  Echocardiogram 2D Echocardiogram has been performed.  Laparis Durrett G Zanobia Griebel 01/16/2018, 3:07 PM

## 2018-01-16 NOTE — Progress Notes (Signed)
Results for [name], Erin [name] (MRN [id number]) as of 01/16/2018 07:44  Ref. Range 06/11/2017 15:54 12/13/2017 16:19 01/15/2018 21:55  Hemoglobin A1C Latest Ref Range: 4.8 - 5.6 % 6.3 8.9 (H) 12.7 (H)  Average glucose 317 mg/dl    Met with pt today.  Spoke with patient about her current A1c of 12.7%.  Explained what an A1c is and what it measures.  Reminded patient that her goal A1c is 7% or less per ADA standards to prevent both acute and long-term complications.  Explained to patient the extreme importance of good glucose control at home.  Encouraged patient to check her CBGs at least TID AC at home and to record all CBGs in [name] logbook for her PCP to review.  Patient told me she has not been officially diagnosed with Diabetes until this admission.  Stated PCP gave her Rx for Metformin as [name] preventative measure back in June.  Has not tolerated Metformin nor Metformin ER.  Does not have CBG meter at home, however, daughter of pt told me pt's husband picked up the CBG meter from the pharmacy today.    Discussed with pt that the steroids she has been taking at home likely have precipitated the diagnosis of DM.  Also discussed with pt that given her extremely high A1c, she will likely need insulin for home.  Pt agreeable to take insulin and willing to learn how to give injections.  Ordered Living Well with Diabetes book and the Insulin Pen teaching kit for pt.  Will ask RNs working with pt to show pt how to check CBGs and also how to give SQ injections.  Will show pt how to use insulin pens tomorrow.  Encouraged pt to read through the DM booklet and schedule [name] follow up with her PCP.  It looks like pt sees Dr. Ajay Kumar with [facility] for Hypothyroidism, so hopefully pt can also see Dr. Kumar for further DM management.     --Will follow patient during hospitalization--  Jeannine [place] Fishel RN, MSN, CDE Diabetes Coordinator Inpatient Glycemic Control Team Team Pager: [phone]  (8a-5p)    

## 2018-01-17 LAB — GLUCOSE, CAPILLARY
Glucose-Capillary: 137 mg/dL — ABNORMAL HIGH (ref 70–99)
Glucose-Capillary: 89 mg/dL (ref 70–99)
Glucose-Capillary: 308 mg/dL — ABNORMAL HIGH (ref 70–99)
Glucose-Capillary: 323 mg/dL — ABNORMAL HIGH (ref 70–99)
Glucose-Capillary: 279 mg/dL — ABNORMAL HIGH (ref 70–99)

## 2018-01-17 LAB — CBC WITH DIFFERENTIAL/PLATELET
Basophils Absolute: 0 10*3/uL (ref 0.0–0.1)
Basophils Relative: 0 %
Eosinophils Absolute: 0 10*3/uL (ref 0.0–0.7)
Eosinophils Relative: 0 %
HCT: 38.1 % (ref 36.0–46.0)
Hemoglobin: 12.2 g/dL (ref 12.0–15.0)
Lymphocytes Relative: 46 %
Lymphs Abs: 5 10*3/uL — ABNORMAL HIGH (ref 0.7–4.0)
MCH: 27.2 pg (ref 26.0–34.0)
MCHC: 32 g/dL (ref 30.0–36.0)
MCV: 85 fL (ref 78.0–100.0)
Monocytes Absolute: 0.5 10*3/uL (ref 0.1–1.0)
Monocytes Relative: 4 %
Neutro Abs: 5.5 10*3/uL (ref 1.7–7.7)
Neutrophils Relative %: 50 %
Platelets: 157 10*3/uL (ref 150–400)
RBC: 4.48 MIL/uL (ref 3.87–5.11)
RDW: 15.1 % (ref 11.5–15.5)
WBC: 11 10*3/uL — ABNORMAL HIGH (ref 4.0–10.5)

## 2018-01-17 LAB — MAGNESIUM: Magnesium: 2.2 mg/dL (ref 1.7–2.4)

## 2018-01-17 LAB — COMPREHENSIVE METABOLIC PANEL
ALT: 61 U/L — ABNORMAL HIGH (ref 0–44)
AST: 22 U/L (ref 15–41)
Albumin: 3 g/dL — ABNORMAL LOW (ref 3.5–5.0)
Alkaline Phosphatase: 129 U/L — ABNORMAL HIGH (ref 38–126)
Anion gap: 8 (ref 5–15)
BUN: 17 mg/dL (ref 6–20)
CO2: 25 mmol/L (ref 22–32)
Calcium: 8.5 mg/dL — ABNORMAL LOW (ref 8.9–10.3)
Chloride: 111 mmol/L (ref 98–111)
Creatinine, Ser: 0.73 mg/dL (ref 0.44–1.00)
GFR calc Af Amer: >60 mL/min (ref >60)
GFR calc non Af Amer: >60 mL/min (ref >60)
Glucose, Bld: 138 mg/dL — ABNORMAL HIGH (ref 70–99)
Potassium: 3.1 mmol/L — ABNORMAL LOW (ref 3.5–5.1)
Sodium: 144 mmol/L (ref 135–145)
Total Bilirubin: 1.2 mg/dL (ref 0.3–1.2)
Total Protein: 5.7 g/dL — ABNORMAL LOW (ref 6.5–8.1)

## 2018-01-17 MED ORDER — POTASSIUM CHLORIDE CRYS ER 20 MEQ PO TBCR
40.0000 meq | EXTENDED_RELEASE_TABLET | ORAL | Status: AC
  Administered 2018-01-17 (×2): 40 meq via ORAL
  Filled 2018-01-17 (×2): qty 2

## 2018-01-17 MED ORDER — INSULIN ASPART 100 UNIT/ML ~~LOC~~ SOLN
6.0000 [IU] | Freq: Three times a day (TID) | SUBCUTANEOUS | Status: DC
  Administered 2018-01-17 – 2018-01-18 (×2): 6 [IU] via SUBCUTANEOUS

## 2018-01-17 MED ORDER — LOSARTAN POTASSIUM 50 MG PO TABS
100.0000 mg | ORAL_TABLET | Freq: Every day | ORAL | Status: DC
  Administered 2018-01-17 – 2018-01-18 (×2): 100 mg via ORAL
  Filled 2018-01-17 (×2): qty 2

## 2018-01-17 MED ORDER — LOSARTAN POTASSIUM 50 MG PO TABS: 100.0000 mg | ORAL_TABLET | Freq: Every day | ORAL | Status: DC

## 2018-01-17 MED ORDER — INSULIN GLARGINE 100 UNIT/ML ~~LOC~~ SOLN
25.0000 [IU] | Freq: Every day | SUBCUTANEOUS | Status: DC
  Administered 2018-01-17 – 2018-01-18 (×2): 25 [IU] via SUBCUTANEOUS
  Filled 2018-01-17 (×4): qty 0.25

## 2018-01-17 NOTE — Plan of Care (Signed)
Nutrition Education Note  RD consulted for nutrition education regarding diabetes. Patient with new-onset type 2 diabetes mellitus.  Spoke with patient and family members at bedside.  Patient states that she has not been eating much over the past 4 weeks due to persistent diarrhea. Noted patient was recently started on Metformin and diarrhea developed shortly after intiation of Metformin. Patient also shares that she is dealing with thrush.  Patient reports that when she is feeling well, "I eat like a beast." Patient reports that the prednisone she takes causes her to constantly be hungry throughout the day. Prednisone likely related to recent elevated blood glucose levels. Patient endorses eating at least 3 meals daily and several snacks.  Patient shares that she is trying to "switch my family over to high-protein, low-carb, low-fat" and wants to eat more salads. Discussed importance of eat food group in a balanced diet and the body's need for carbohydrates.  Breakfast: grits and coffee Lunch: double Agricultural consultant, large fry, Frosty Dinner: hamburger OR baked chicken  Lab Results  Component Value Date   HGBA1C 12.7 (H) 01/15/2018    RD provided "Carbohydrate Counting for People with Diabetes" handout from the Academy of [employer] and Dietetics. Discussed different food groups and their effects on blood sugar, emphasizing carbohydrate-containing foods. Provided list of carbohydrates and recommended serving sizes of common foods. Noted patient will likely discharge home with insulin. Briefly discussed how insulin dosage is related to carbohydrate intake. Patient is looking forward to learning more from Diabetes Coordinator.  Discussed importance of controlled and consistent carbohydrate intake throughout the day. Provided examples of ways to balance meals/snacks and encouraged intake of high-fiber, whole grain complex carbohydrates. Teach back method used.  Expect good compliance.  Body  mass index is 54.44 kg/m. Pt meets criteria for "obesity class III" based on current BMI.  Current diet order is Heart Healthy/Carb Modified. No meal completion charted at this time. Labs and medications reviewed. No further nutrition interventions warranted at this time. RD contact information provided. If additional nutrition issues arise, please re-consult RD.   Gaynell Face, MS, RD, LDN Pager: [phone] Weekend/After Hours: [phone]

## 2018-01-17 NOTE — Progress Notes (Signed)
PT Cancellation Note  Patient Details Name: Erin [name] MRN: [id number] DOB: 1965-12-17   Cancelled Treatment:    Reason Eval/Treat Not Completed: Medical issues which prohibited therapy Noted recent BP with significantly high diastolic value (815/947) at rest. Plan to hold PT for now but will follow and attempt to return if/as time and schedule allow, and if patient is medically appropriate.    Deniece Ree PT, DPT, CBIS  Supplemental Physical Therapist [facility]   Pager [phone]

## 2018-01-17 NOTE — Progress Notes (Addendum)
PROGRESS NOTE    Erin [name]  [id number] DOB: 07/12/1965 DOA: 01/15/2018 PCP: Binnie Rail, MD    Brief Narrative:  Patient is a pleasant 52 year old female history of temporal arteritis, gastroesophageal reflux disease, diabetes, hypothyroidism following radioiodine therapy, morbid obesity presented to the ED with shortness of breath times several months worse over the past 3 weeks when prednisone taper had begun.  Shortness of breath worse on mild exertion with ambulation and even on standing.  Patient also noted to have lower extremity swelling, redness.  No cough.  Patient also did complain of some intermittent midsternal chest pain last episode 2 days prior to admission.  On admission noted to be tachycardic, systolic blood pressure of 116, glucose of 732, anion gap of 20, bicarb of 20.  Patient admitted for DKA and placed on the glucose stabilizer.   Assessment & Plan:   Principal Problem:   Hyperosmolar non-ketotic state in patient with type 2 diabetes mellitus ([place]) Active Problems:   DKA (diabetic ketoacidoses) ([place])   Hypothyroidism following radioiodine therapy   Morbid obesity ([facility])   Palpitations   Hypertension   Temporal arteritis ([place])   Gastroesophageal reflux disease   Oral thrush   Vaginal candidiasis   Diabetes mellitus type 2, uncomplicated ([place])  #1 hyperosmolar nonketotic state in type 2 diabetes mellitus/newly diagnosed diabetes mellitus Patient presenting with hyperosmolar nonketotic hyperglycemia and newly diagnosed diabetes mellitus recently in June 2019.  CBG on admission was 732 with a anion gap of 20 with a bicarb of 20.  Questionable etiology.  Cardiac enzymes negative x3.  Patient with some atypical chest pain.  No signs or symptoms of infection.  Urinalysis nitrite negative leukocytes negative.  Patient admitted placed on the glucose stabilizer anion gap is closed.  Hemoglobin A1c 12.7.  Patient initially transitioned to Lantus 20 units daily.   Patient was maintained on sliding scale insulin.  Patient was noted to have a CBG of 490 at 1634 hrs. on 01/16/2018 and given 20 units of NovoLog.  Lantus dose was adjusted to 25 units daily however patient did not receive Lantus last night 01/16/2018.  Continue resistant sliding scale insulin.  Will reschedule patient to get Lantus this morning 01/17/2018.  Will discontinue meal coverage insulin.  Continue sliding scale insulin.  Diabetic coordinator following.  Continue IV fluids for now.  Will need outpatient follow-up with her endocrinologist.  Patient will need insulin on discharge.  2.  Dehydration IV fluids.  3.  Oral thrush/vaginal candidiasis Likely secondary to oral steroid treatment.  Continue current regimen of oral nystatin swish and swallow, Diflucan, clotrimazole vaginal cream.  Status post 1 dose IV fluconazole on 01/16/2018. Follow.  4.  Gastroesophageal reflux disease PPI.  5.  Atypical chest pain Patient with complaints of midsternal nonradiating sharp chest pain which she felt was likely secondary to gastroesophageal reflux disease.  Patient with oral thrush.  Patient on chronic prednisone.  Cardiac enzymes negative x3.  2D echo pending.  If 2D echo is abnormal will consult with cardiology for further evaluation and management.  Concern for possible esophageal candidiasis versus reflux.  Continue oral nystatin swish and swallow.  Continue Diflucan.  Continue PPI.  Follow.  6.  Loose stools Likely secondary to metformin.  Improving.  Follow.  7.  Acute kidney injury Creatinine on admission was 1.3.  Improved with hydration.  Monitor closely.  8.  Hypertension Systolic blood pressure noted to be in the 101/72 early this morning.  Discontinue Cozaar due to low  blood pressure.  Continue beta-blocker for now.  If blood pressure becomes elevated throughout the day will resume home regimen of Cozaar.  Follow.   9.  Temporal arteritis Per admission H&P noted that the diagnosis was in  question by rheumatologist.  Patient on chronic prednisone which we will continue and defer further taper to rheumatologist.  Follow.  10.  Elevated liver enzymes Likely secondary to hepatic steatosis. Right upper quadrant ultrasound status post cholecystectomy with hepatic steatosis.  Follow LFTs.  Check a fasting lipid panel may need to be started on a statin.  11.  Hypothyroidism status post radioiodine therapy for Graves' disease Patient's endocrinologist recently reduced Synthroid dose from 1 37-1 25 MCG's.  TSH 0.2 improved from 0.1.  T4 on 11/27/2017 was within normal limits at 1.14.  Continue current dose of Synthroid.  Outpatient follow-up with urologist.  12.  Palpitations Questionable etiology.  May be secondary to dehydration.  Patient status post radioiodine therapy for Graves' disease.  She hydrated with IV fluids and started on low-dose beta-blocker.  Improvement with tachycardia/palpitations.  13.  Hypokalemia Replete.     DVT prophylaxis: Lovenox Code Status: Full Family Communication: Updated patient and family at bedside. Disposition Plan: Transfer to telemetry.  Home once clinically stable medically improved, CBGs improved as well as blood pressure.     Consultants:   None  Procedures:   CXR 01/15/2018  Right upper quadrant ultrasound 01/15/2018  2D echo 01/16/2018  Antimicrobials:   None   Subjective: Patient in bed eating breakfast.  No chest pain.  No shortness of breath.  Feeling better.  States loose stools improving.   Objective: Vitals:   01/17/18 0600 01/17/18 0741 01/17/18 0801 01/17/18 0810  BP: 106/65  (!) 152/106   Pulse: 85  (!) 104   Resp: 17     Temp:    98 F (36.7 C)  TempSrc:    Oral  SpO2: 96%     Weight:  (!) 148.4 kg (327 lb 2.6 oz)    Height:        Intake/Output Summary (Last 24 hours) at 01/17/2018 0939 Last data filed at 01/17/2018 0600 Gross per 24 hour  Intake 1418.75 ml  Output -  Net 1418.75 ml   Filed Weights     01/16/18 0000 01/17/18 0741  Weight: (!) 144.1 kg (317 lb 10.9 oz) (!) 148.4 kg (327 lb 2.6 oz)    Examination:  General exam: NAD.  Oral thrush. Respiratory system: Lungs clear to auscultation bilaterally.  No wheezes, no crackles, no rhonchi.  Respiratory effort normal. Cardiovascular system: Tachycardia.  No JVD.  No murmurs, no rubs, no rhonchi.  No lower extremity edema.  Gastrointestinal system: Abdomen is soft, obese, nontender, nondistended, positive bowel sounds.  No rebound.  No guarding.  Central nervous system: Alert and oriented. No focal neurological deficits. Extremities: Symmetric 5 x 5 power. Skin: No rashes, lesions or ulcers Psychiatry: Judgement and insight appear normal. Mood & affect appropriate.     Data Reviewed: I have personally reviewed following labs and imaging studies  CBC: Recent Labs  Lab 01/15/18 1815 01/16/18 0400 01/17/18 0325  WBC 11.6* 13.1* 11.0*  NEUTROABS 9.7*  --  5.5  HGB 14.6 14.2 12.2  HCT 45.4 44.0 38.1  MCV 85.2 84.6 85.0  PLT 199 176 350   Basic Metabolic Panel: Recent Labs  Lab 01/15/18 2348 01/16/18 0400 01/16/18 1706 01/16/18 2208 01/17/18 0325  NA 148* 145 141 140 144  K 3.8 4.4 4.1  3.4* 3.1*  CL 111 109 106 109 111  CO2 24 25 23 24 25   GLUCOSE 317* 238* 542* 318* 138*  BUN 23* 21* 22* 18 17  CREATININE 1.10* 0.99 0.91 0.73 0.73  CALCIUM 9.6 9.1 8.9 8.4* 8.5*  MG  --   --   --   --  2.2   GFR: Estimated Creatinine Clearance: 122.9 mL/min (by C-G formula based on SCr of 0.73 mg/dL). Liver Function Tests: Recent Labs  Lab 01/15/18 1815 01/17/18 0325  AST 48* 22  ALT 96* 61*  ALKPHOS 199* 129*  BILITOT 2.3* 1.2  PROT 7.3 5.7*  ALBUMIN 4.0 3.0*   No results for input(s): LIPASE, AMYLASE in the last 168 hours. No results for input(s): AMMONIA in the last 168 hours. Coagulation Profile: No results for input(s): INR, PROTIME in the last 168 hours. Cardiac Enzymes: Recent Labs  Lab 01/15/18 1815  01/15/18 2155 01/16/18 0400 01/16/18 0950  CKTOTAL 61  --   --   --   TROPONINI  --  <0.03 <0.03 <0.03   BNP (last 3 results) No results for input(s): PROBNP in the last 8760 hours. HbA1C: Recent Labs    01/15/18 2155  HGBA1C 12.7*   CBG: Recent Labs  Lab 01/16/18 1835 01/16/18 1942 01/16/18 2243 01/17/18 0312 01/17/18 0800  GLUCAP 429* 428* 283* 137* 89   Lipid Profile: No results for input(s): CHOL, HDL, LDLCALC, TRIG, CHOLHDL, LDLDIRECT in the last 72 hours. Thyroid Function Tests: Recent Labs    01/15/18 1815  TSH 0.288*   Anemia Panel: No results for input(s): VITAMINB12, FOLATE, FERRITIN, TIBC, IRON, RETICCTPCT in the last 72 hours. Sepsis Labs: No results for input(s): PROCALCITON, LATICACIDVEN in the last 168 hours.  Recent Results (from the past 240 hour(s))  MRSA PCR Screening     Status: None   Collection Time: 01/16/18  7:42 AM  Result Value Ref Range Status   MRSA by PCR NEGATIVE NEGATIVE Final    Comment:        The GeneXpert MRSA Assay (FDA approved for NASAL specimens only), is one component of a comprehensive MRSA colonization surveillance program. It is not intended to diagnose MRSA infection nor to guide or monitor treatment for MRSA infections. Performed at [facility], [place] [address] [place], [place] [address]          Radiology Studies: Dg Chest 2 View  Result Date: 01/15/2018 CLINICAL DATA:  Weakness, dyspnea, fatigue for 2 weeks. EXAM: CHEST - 2 VIEW COMPARISON:  Chest x-ray dated 08/17/2016. FINDINGS: The heart size and mediastinal contours are within normal limits. Both lungs are clear. The visualized skeletal structures are unremarkable. IMPRESSION: No active cardiopulmonary disease. No evidence of pneumonia or pulmonary edema. Electronically Signed   By: Franki Cabot M.D.   On: 01/15/2018 17:33   US Abdomen Limited Ruq  Result Date: 01/15/2018 CLINICAL DATA:  Transaminitis EXAM: ULTRASOUND ABDOMEN  LIMITED RIGHT UPPER QUADRANT COMPARISON:  None. FINDINGS: Gallbladder: Gallbladder is surgically absent. The gallbladder fossa was imaged and is unremarkable. Common bile duct: Diameter: 1.8 mm Liver: Increased hepatic echogenicity consistent with steatosis. Portal vein is patent on color Doppler imaging with normal direction of blood flow towards the liver. IMPRESSION: 1. Status post cholecystectomy. 2. Hepatic steatosis. Electronically Signed   By: Ulyses Jarred M.D.   On: 01/15/2018 23:59        Scheduled Meds: . clotrimazole  1 Applicatorful Vaginal QHS  . enoxaparin (LOVENOX) injection  70 mg Subcutaneous  Daily  . fluconazole  100 mg Oral Daily  . insulin aspart  0-20 Units Subcutaneous Q4H  . insulin aspart  0-5 Units Subcutaneous QHS  . insulin glargine  25 Units Subcutaneous Daily  . levothyroxine  125 mcg Oral QAC breakfast  . metoprolol tartrate  12.5 mg Oral BID  . nystatin  5 mL Oral QID  . pantoprazole  40 mg Oral Q breakfast  . potassium chloride  40 mEq Oral Q4H  . predniSONE  50 mg Oral Q breakfast   Continuous Infusions: . sodium chloride 125 mL/hr at 01/17/18 0800     LOS: 2 days    Time spent: 40 minutes    Irine Seal, MD [facility] Pager [phone]  If 7PM-7AM, please contact night-coverage [email or web address] Password [facility] 01/17/2018, 9:39 AM

## 2018-01-17 NOTE — Progress Notes (Signed)
Inpatient Diabetes Program Recommendations  AACE/ADA: New Consensus Statement on Inpatient Glycemic Control (2015)  Target Ranges:  Prepandial:   less than 140 mg/dL      Peak postprandial:   less than 180 mg/dL (1-2 hours)      Critically ill patients:  140 - 180 mg/dL   Results for Erin [name], Erin [name] (MRN [id number]) as of 01/17/2018 15:10  Ref. Range 01/16/2018 22:43 01/17/2018 03:12 01/17/2018 08:00 01/17/2018 11:48  Glucose-Capillary Latest Ref Range: 70 - 99 mg/dL 283 (H) 137 (H) 89 308 (H)    Admit with: DKA  History: DM, Temporal Arteritis (Chronic Prednisone at home)  Home DM Meds: Metformin 1000 mg daily  Current Insulin Orders: Lantus 25 units daily      Novolog Resistant Correction Scale/ SSI (0-20 units) Q4 hours       MD- Please consider the following in-hospital insulin adjustments:  1. Change Novolog SSI to TID AC + HS (currently ordered Q4 hours)  2. Start back Novolog Meal Coverage: Novolog 6 units TID with meals (Please add the following Hold Parameters: Hold if pt eats <50% of meal, Hold if pt NPO)  3. Patient would prefer Insulin Pens at time of discharge.  Please make sure to give pt the following Rxs at time of d/c:  Lantus Insulin Pen- Order # [id number]  Novolog Insulin Pen- Order # [id number]  Insulin Pen Needles- Order # [id number]    Addendum 3pm- Met with pt and her daughter again today.  Reviewed CBG goals/ A1c goals for home and encouraged pt to make a follow up appt with her Endocrinologist (pt stated she is planning to switch providers to Dr. Philemon Kingdom with [facility]).  Reviewed signs and symptoms of hyperglycemia and hypoglycemia and how to treat both conditions at home.  Provided brief explanation of pathophysiology behind Type 2 DM and the acute and chronic complications of DM.  Encouraged pt to check her CBGs at least TID AC at home and to record all CBG in a logbook.  Also reviewed the difference between Lantus and Novolog, how they  work, when to take, etc.  Pt stated she has been practicing giving injections here in hospital.  RN came in and had pt give herself injection while I was in the room.  Pt did very well with the injection.  Educated patient on insulin pen use at home.  Reviewed contents of insulin flexpen starter kit.  Reviewed all steps of insulin pen including attachment of needle, 2-unit air shot, dialing up dose, giving injection, rotation of injection sites, removing needle, disposal of sharps, storage of unused insulin, disposal of insulin etc.  Patient able to provide successful return demonstration.  Reviewed troubleshooting with insulin pen.  MD to give patient Rxs for insulin pens and insulin pen needles.       --Will follow patient during hospitalization--  Wyn Quaker RN, MSN, CDE Diabetes Coordinator Inpatient Glycemic Control Team Team Pager: [phone] (8a-5p)

## 2018-01-18 LAB — COMPREHENSIVE METABOLIC PANEL
ALT: 60 U/L — ABNORMAL HIGH (ref 0–44)
AST: 22 U/L (ref 15–41)
Albumin: 3.1 g/dL — ABNORMAL LOW (ref 3.5–5.0)
Alkaline Phosphatase: 137 U/L — ABNORMAL HIGH (ref 38–126)
Anion gap: 9 (ref 5–15)
BUN: 10 mg/dL (ref 6–20)
CO2: 24 mmol/L (ref 22–32)
Calcium: 8.7 mg/dL — ABNORMAL LOW (ref 8.9–10.3)
Chloride: 105 mmol/L (ref 98–111)
Creatinine, Ser: 0.78 mg/dL (ref 0.44–1.00)
GFR calc Af Amer: >60 mL/min (ref >60)
GFR calc non Af Amer: >60 mL/min (ref >60)
Glucose, Bld: 193 mg/dL — ABNORMAL HIGH (ref 70–99)
Potassium: 3.1 mmol/L — ABNORMAL LOW (ref 3.5–5.1)
Sodium: 138 mmol/L (ref 135–145)
Total Bilirubin: 1.4 mg/dL — ABNORMAL HIGH (ref 0.3–1.2)
Total Protein: 5.8 g/dL — ABNORMAL LOW (ref 6.5–8.1)

## 2018-01-18 LAB — CBC WITH DIFFERENTIAL/PLATELET
Basophils Absolute: 0 10*3/uL (ref 0.0–0.1)
Basophils Relative: 0 %
Eosinophils Absolute: 0 10*3/uL (ref 0.0–0.7)
Eosinophils Relative: 0 %
HCT: 36.3 % (ref 36.0–46.0)
Hemoglobin: 11.7 g/dL — ABNORMAL LOW (ref 12.0–15.0)
Lymphocytes Relative: 46 %
Lymphs Abs: 5.1 10*3/uL — ABNORMAL HIGH (ref 0.7–4.0)
MCH: 27 pg (ref 26.0–34.0)
MCHC: 32.2 g/dL (ref 30.0–36.0)
MCV: 83.8 fL (ref 78.0–100.0)
Monocytes Absolute: 0.5 10*3/uL (ref 0.1–1.0)
Monocytes Relative: 5 %
Neutro Abs: 5.3 10*3/uL (ref 1.7–7.7)
Neutrophils Relative %: 49 %
Platelets: 172 10*3/uL (ref 150–400)
RBC: 4.33 MIL/uL (ref 3.87–5.11)
RDW: 14.8 % (ref 11.5–15.5)
WBC: 10.9 10*3/uL — ABNORMAL HIGH (ref 4.0–10.5)

## 2018-01-18 LAB — GLUCOSE, CAPILLARY
Glucose-Capillary: 159 mg/dL — ABNORMAL HIGH (ref 70–99)
Glucose-Capillary: 177 mg/dL — ABNORMAL HIGH (ref 70–99)
Glucose-Capillary: 188 mg/dL — ABNORMAL HIGH (ref 70–99)
Glucose-Capillary: 290 mg/dL — ABNORMAL HIGH (ref 70–99)
Glucose-Capillary: 317 mg/dL — ABNORMAL HIGH (ref 70–99)
Glucose-Capillary: 365 mg/dL — ABNORMAL HIGH (ref 70–99)

## 2018-01-18 LAB — LIPID PANEL
Cholesterol: 224 mg/dL — ABNORMAL HIGH (ref 0–200)
HDL: 65 mg/dL (ref >40)
LDL Cholesterol: 130 mg/dL — ABNORMAL HIGH (ref 0–99)
Total CHOL/HDL Ratio: 3.4 RATIO
Triglycerides: 145 mg/dL (ref <150)
VLDL: 29 mg/dL (ref 0–40)

## 2018-01-18 LAB — MAGNESIUM: Magnesium: 2 mg/dL (ref 1.7–2.4)

## 2018-01-18 MED ORDER — INSULIN ASPART 100 UNIT/ML ~~LOC~~ SOLN
9.0000 [IU] | Freq: Three times a day (TID) | SUBCUTANEOUS | Status: DC
  Administered 2018-01-18 – 2018-01-19 (×5): 9 [IU] via SUBCUTANEOUS

## 2018-01-18 MED ORDER — METOPROLOL TARTRATE 25 MG PO TABS
25.0000 mg | ORAL_TABLET | Freq: Two times a day (BID) | ORAL | Status: DC
  Administered 2018-01-18 – 2018-01-20 (×5): 25 mg via ORAL
  Filled 2018-01-18 (×5): qty 1

## 2018-01-18 MED ORDER — POTASSIUM CHLORIDE CRYS ER 20 MEQ PO TBCR
40.0000 meq | EXTENDED_RELEASE_TABLET | ORAL | Status: AC
  Administered 2018-01-18 (×2): 40 meq via ORAL
  Filled 2018-01-18 (×2): qty 2

## 2018-01-18 MED ORDER — INSULIN ASPART 100 UNIT/ML ~~LOC~~ SOLN
0.0000 [IU] | Freq: Three times a day (TID) | SUBCUTANEOUS | Status: DC
  Administered 2018-01-18: 11 [IU] via SUBCUTANEOUS
  Administered 2018-01-18: 20 [IU] via SUBCUTANEOUS
  Administered 2018-01-19: 15 [IU] via SUBCUTANEOUS
  Administered 2018-01-19 (×2): 7 [IU] via SUBCUTANEOUS
  Administered 2018-01-20: 12 [IU] via SUBCUTANEOUS
  Administered 2018-01-20: 11 [IU] via SUBCUTANEOUS

## 2018-01-18 MED ORDER — ATORVASTATIN CALCIUM 40 MG PO TABS
40.0000 mg | ORAL_TABLET | Freq: Every day | ORAL | Status: DC
Start: 2018-01-18 — End: 2018-01-20
  Administered 2018-01-18 – 2018-01-19 (×2): 40 mg via ORAL
  Filled 2018-01-18 (×2): qty 1

## 2018-01-18 MED ORDER — IBUPROFEN 200 MG PO TABS
400.0000 mg | ORAL_TABLET | Freq: Once | ORAL | Status: AC
  Administered 2018-01-18: 400 mg via ORAL
  Filled 2018-01-18: qty 2

## 2018-01-18 NOTE — Progress Notes (Signed)
PROGRESS NOTE    Erin [name]  [id number] DOB: 1965/12/21 DOA: 01/15/2018 PCP: Binnie Rail, MD    Brief Narrative:  Patient is a pleasant 52 year old female history of temporal arteritis, gastroesophageal reflux disease, diabetes, hypothyroidism following radioiodine therapy, morbid obesity presented to the ED with shortness of breath times several months worse over the past 3 weeks when prednisone taper had begun.  Shortness of breath worse on mild exertion with ambulation and even on standing.  Patient also noted to have lower extremity swelling, redness.  No cough.  Patient also did complain of some intermittent midsternal chest pain last episode 2 days prior to admission.  On admission noted to be tachycardic, systolic blood pressure of 116, glucose of 732, anion gap of 20, bicarb of 20.  Patient admitted for DKA and placed on the glucose stabilizer.   Assessment & Plan:   Principal Problem:   Hyperosmolar non-ketotic state in patient with type 2 diabetes mellitus ([place]) Active Problems:   DKA (diabetic ketoacidoses) ([place])   Hypothyroidism following radioiodine therapy   Morbid obesity ([facility])   Palpitations   Hypertension   Temporal arteritis ([place])   Gastroesophageal reflux disease   Oral thrush   Vaginal candidiasis   Diabetes mellitus type 2, uncomplicated ([place])  #1 hyperosmolar nonketotic state in type 2 diabetes mellitus/newly diagnosed diabetes mellitus Patient presenting with hyperosmolar nonketotic hyperglycemia and newly diagnosed diabetes mellitus recently in June 2019.  CBG on admission was 732 with a anion gap of 20 with a bicarb of 20.  Questionable etiology.  Cardiac enzymes negative x3.  Patient with some atypical chest pain.  No signs or symptoms of infection.  Urinalysis nitrite negative leukocytes negative.  Patient admitted placed on the glucose stabilizer anion gap is closed.  Hemoglobin A1c 12.7.  Patient initially transitioned to Lantus 20 units daily.   Patient was maintained on sliding scale insulin.  Patient was noted to have a CBG of 490 at 1634 hrs. on 01/16/2018 and given 20 units of NovoLog.  Lantus dose was adjusted to 25 units daily however patient did not receive Lantus the night 01/16/2018.  Continue resistant sliding scale insulin.  Lantus scheduled to be given in the mornings which was started on 01/17/2018.  CBG this morning was 159.  Continue current regimen of Lantus, sliding scale insulin.  Will reschedule patient to get Lantus this morning 01/17/2018.  Increase meal coverage insulin to 9 units 3 times daily with meals.  Diabetic coordinator following. Will need outpatient follow-up with her endocrinologist.  Patient will need insulin on discharge.  2.  Dehydration Saline lock IV fluids.  3.  Oral thrush/vaginal candidiasis Likely secondary to oral steroid treatment.  Improving clinically.  Continue current regimen of oral nystatin swish and swallow, Diflucan, clotrimazole vaginal cream.  Status post 1 dose IV fluconazole on 01/16/2018. Follow.  4.  Gastroesophageal reflux disease PPI.  5.  Atypical chest pain Patient with complaints of midsternal nonradiating sharp chest pain which she felt was likely secondary to gastroesophageal reflux disease.  Patient with oral thrush.  Patient on chronic prednisone.  Cardiac enzymes negative x3.  2D echo with a EF of 55 to 60%, no wall motion abnormalities, grade 1 diastolic dysfunction.   Concern for possible esophageal candidiasis versus reflux.  Continue oral nystatin swish and swallow, Diflucan, PPI.Follow.  6.  Loose stools Likely secondary to metformin.  Improving per patient.  Follow.    7.  Acute kidney injury Creatinine on admission was 1.3.  Improved  with hydration.  Monitor closely.  8.  Hypertension Systolic blood pressure noted to be in the 102/72 early this morning.  Will increase Lopressor to 25 mg twice daily.  Discontinue Cozaar.  Check orthostatics as patient was noted to have  some unsteadiness while working with physical therapy.    9.  Temporal arteritis Per admission H&P noted that the diagnosis was in question by rheumatologist.  Patient on chronic prednisone which we will continue and defer further taper to rheumatologist.  Follow.  10.  Elevated liver enzymes Likely secondary to hepatic steatosis. Right upper quadrant ultrasound status post cholecystectomy with hepatic steatosis.  Follow LFTs.  Fasting lipid panel with a LDL of 130.  Start statin.    11.  Hypothyroidism status post radioiodine therapy for Graves' disease Patient's endocrinologist recently reduced Synthroid dose from 1 37-1 25 MCG's.  TSH 0.2 improved from 0.1.  T4 on 11/27/2017 was within normal limits at 1.14.  Continue current dose of Synthroid.  Outpatient follow-up with endocrinologist.  12.  Palpitations Questionable etiology.  May be secondary to dehydration.  Patient status post radioiodine therapy for Graves' disease.  Patient hydrated with IV fluids.  Increase Lopressor to 25 mg twice daily.  Follow.    13.  Hypokalemia Replete.  Magnesium level 2.0.     DVT prophylaxis: Lovenox Code Status: Full Family Communication: Updated patient and family at bedside. Disposition Plan: Home with home health once medically stable with clinical improvement and improvement with blood glucose levels hopefully in the next 24 to 48 hours.     Consultants:   None  Procedures:   CXR 01/15/2018  Right upper quadrant ultrasound 01/15/2018  2D echo 01/16/2018  Antimicrobials:   None   Subjective: Patient sleeping however easily arousable.  Stated she had a headache this morning.  Denies any chest pain or shortness of breath.  Feels better.    Objective: Vitals:   01/18/18 0433 01/18/18 0700 01/18/18 0904 01/18/18 1135  BP: (!) 133/94  (!) 136/106 102/72  Pulse: 90  98 93  Resp: 18   20  Temp: 98.2 F (36.8 C)   98.5 F (36.9 C)  TempSrc:    Oral  SpO2: 100%   97%  Weight:  (!)  149.5 kg (329 lb 8 oz)    Height:        Intake/Output Summary (Last 24 hours) at 01/18/2018 1207 Last data filed at 01/18/2018 0600 Gross per 24 hour  Intake 2105 ml  Output -  Net 2105 ml   Filed Weights   01/16/18 0000 01/17/18 0741 01/18/18 0700  Weight: (!) 144.1 kg (317 lb 10.9 oz) (!) 148.4 kg (327 lb 2.6 oz) (!) 149.5 kg (329 lb 8 oz)    Examination:  General exam: NAD.  Oral thrush. Respiratory system: CTAB.  No wheezes, no crackles, no rhonchi.  Normal respiratory effort.   Cardiovascular system: Regular rate rhythm no murmurs rubs or gallops.  No JVD.  No lower extremity edema. Gastrointestinal system: Abdomen is obese, soft, nontender, nondistended, positive bowel sounds.  No rebound.  No guarding.  Central nervous system: Alert and oriented. No focal neurological deficits. Extremities: Symmetric 5 x 5 power. Skin: No rashes, lesions or ulcers Psychiatry: Judgement and insight appear normal. Mood & affect appropriate.     Data Reviewed: I have personally reviewed following labs and imaging studies  CBC: Recent Labs  Lab 01/15/18 1815 01/16/18 0400 01/17/18 0325 01/18/18 0458  WBC 11.6* 13.1* 11.0* 10.9*  NEUTROABS 9.7*  --  5.5 5.3  HGB 14.6 14.2 12.2 11.7*  HCT 45.4 44.0 38.1 36.3  MCV 85.2 84.6 85.0 83.8  PLT 199 176 157 564   Basic Metabolic Panel: Recent Labs  Lab 01/16/18 0400 01/16/18 1706 01/16/18 2208 01/17/18 0325 01/18/18 0458  NA 145 141 140 144 138  K 4.4 4.1 3.4* 3.1* 3.1*  CL 109 106 109 111 105  CO2 25 23 24 25 24   GLUCOSE 238* 542* 318* 138* 193*  BUN 21* 22* 18 17 10   CREATININE 0.99 0.91 0.73 0.73 0.78  CALCIUM 9.1 8.9 8.4* 8.5* 8.7*  MG  --   --   --  2.2 2.0   GFR: Estimated Creatinine Clearance: 123.5 mL/min (by C-G formula based on SCr of 0.78 mg/dL). Liver Function Tests: Recent Labs  Lab 01/15/18 1815 01/17/18 0325 01/18/18 0458  AST 48* 22 22  ALT 96* 61* 60*  ALKPHOS 199* 129* 137*  BILITOT 2.3* 1.2 1.4*    PROT 7.3 5.7* 5.8*  ALBUMIN 4.0 3.0* 3.1*   No results for input(s): LIPASE, AMYLASE in the last 168 hours. No results for input(s): AMMONIA in the last 168 hours. Coagulation Profile: No results for input(s): INR, PROTIME in the last 168 hours. Cardiac Enzymes: Recent Labs  Lab 01/15/18 1815 01/15/18 2155 01/16/18 0400 01/16/18 0950  CKTOTAL 61  --   --   --   TROPONINI  --  <0.03 <0.03 <0.03   BNP (last 3 results) No results for input(s): PROBNP in the last 8760 hours. HbA1C: Recent Labs    01/15/18 2155  HGBA1C 12.7*   CBG: Recent Labs  Lab 01/17/18 2008 01/18/18 0008 01/18/18 0431 01/18/18 0741 01/18/18 1131  GLUCAP 279* 188* 177* 159* 290*   Lipid Profile: Recent Labs    01/18/18 0458  CHOL 224*  HDL 65  LDLCALC 130*  TRIG 145  CHOLHDL 3.4   Thyroid Function Tests: Recent Labs    01/15/18 1815  TSH 0.288*   Anemia Panel: No results for input(s): VITAMINB12, FOLATE, FERRITIN, TIBC, IRON, RETICCTPCT in the last 72 hours. Sepsis Labs: No results for input(s): PROCALCITON, LATICACIDVEN in the last 168 hours.  Recent Results (from the past 240 hour(s))  MRSA PCR Screening     Status: None   Collection Time: 01/16/18  7:42 AM  Result Value Ref Range Status   MRSA by PCR NEGATIVE NEGATIVE Final    Comment:        The GeneXpert MRSA Assay (FDA approved for NASAL specimens only), is one component of a comprehensive MRSA colonization surveillance program. It is not intended to diagnose MRSA infection nor to guide or monitor treatment for MRSA infections. Performed at [facility], [place] [address] [place], [place] [address]          Radiology Studies: No results found.      Scheduled Meds: . atorvastatin  40 mg Oral q1800  . clotrimazole  1 Applicatorful Vaginal QHS  . enoxaparin (LOVENOX) injection  70 mg Subcutaneous Daily  . fluconazole  100 mg Oral Daily  . insulin aspart  0-20 Units Subcutaneous TID WC  .  insulin aspart  0-5 Units Subcutaneous QHS  . insulin aspart  9 Units Subcutaneous TID WC  . insulin glargine  25 Units Subcutaneous Daily  . levothyroxine  125 mcg Oral QAC breakfast  . losartan  100 mg Oral Daily  . metoprolol tartrate  25 mg Oral BID  . nystatin  5 mL Oral QID  .  pantoprazole  40 mg Oral Q breakfast  . predniSONE  50 mg Oral Q breakfast   Continuous Infusions:    LOS: 3 days    Time spent: 40 minutes    Irine Seal, MD [facility] Pager [phone]  If 7PM-7AM, please contact night-coverage [email or web address] Password [facility] 01/18/2018, 12:07 PM

## 2018-01-18 NOTE — Progress Notes (Signed)
Patient watched education channels [phone] this shift with daughter at bedside. Pt took notes in her notebook and stated it was "a lot of good information." Pt also administered her own insulin injections to abdomen and stated she knows how to check her CBG using lancet and glucometer.

## 2018-01-18 NOTE — Progress Notes (Signed)
OT Cancellation Note  Patient Details Name: JINAN BIGGINS MRN: [id number] DOB: 05-21-1966   Cancelled Treatment:    Reason Eval/Treat Not Completed: Other (comment)  Spoke with PT regarding pts progress this day.  OT will check on pt next day as pt medically ready and able to tolerate OT Kari Baars, [place]  Payton Mccallum D 01/18/2018, 1:35 PM

## 2018-01-18 NOTE — Evaluation (Signed)
Physical Therapy Evaluation Patient Details Name: Erin [name] MRN: [id number] DOB: 1966/01/22 Today's Date: 01/18/2018   History of Present Illness  52 yo admit with DKA, tachycardia, ad systolic BP of 315. H/O temporal arteritis, gastroesophageal reflux disease, diabetes, hypothyroidism following radioiodine therapy, morbid obesity presented to the ED with shortness of breath times several months worse over the past 3 weeks when prednisone taper had begun.  Shortness of breath worse on mild exertion with ambulation and even on standing.  Patient also noted to have lower extremity swelling, redness    Clinical Impression  Assessment of pt started today however limited due to patient feeling lightheaded, pressure across her forehead and face, flush and weak behind her knees after about 1-3 minutes upright . She has been ambulating back and forth to the bathroom, however upon returning to sit she is very "flush feeling" and unsteady on her feet. It subsides after 5 minutes or so, but returns quickly if she is up again for 1-2 minutes. Pt is very motivated and stated her chest pain has improved, however limited with upright mobility due to this "weak" feeling once she is up.PT has been getting BPS taken in lower L arm, so we switched to the upper L arm. Once sitting and we were able to obtain a reading, they were on the lower side. Nurse was in room and aware, and will begin to take BPS in upper arm throughout the day today. And pt and dtr educated that she MUST have nursing with her any time she is up.     Follow Up Recommendations Home health PT;Supervision for mobility/OOB    Equipment Recommendations  None recommended by PT    Recommendations for Other Services       Precautions / Restrictions Restrictions Weight Bearing Restrictions: No      Mobility  Bed Mobility Overal bed mobility: Independent                Transfers Overall transfer level: Needs assistance Equipment  used: 1 person hand held assist Transfers: Sit to/from Stand Sit to Stand: Min guard;Min assist         General transfer comment: only due to patient reporting flush and weak feeling in knees once stading and moving around.   Ambulation/Gait Ambulation/Gait assistance: Min guard;+2 safety/equipment Gait Distance (Feet): 7 Feet(3 times ) Assistive device: 1 person hand held assist;2 person hand held assist Gait Pattern/deviations: Step-through pattern     General Gait Details: only due to patient reporting flush and weak feeling in knees once standing and moving around, so second person needed for folllowing with chair, safety for potential to pass out, etc. After even a few steps to the bathroom and back, pt with a "catch step" due to feeling light headed, pressure across her forehead and weak behind both her knees.   Stairs            Wheelchair Mobility    Modified Rankin (Stroke Patients Only)       Balance                                             Pertinent Vitals/Pain Pain Assessment: No/denies pain    Home Living Family/patient expects to be discharged to:: Private residence Living Arrangements: Children;Spouse/significant other Available Help at Discharge: Family;Available PRN/intermittently(daughter can help some ) Type of Home: Apartment Home Access:  Elevator     Home Layout: One level Home Equipment: None      Prior Function Level of Independence: Independent         Comments: limited recently only due to how she felt when attempting any mobility. Recently she would be limited with what she could due due to feeling., weak, flush, and wiped out.  However, before this recent event, she had been mobile, active, etc with no difficulties.      Hand Dominance        Extremity/Trunk Assessment        Lower Extremity Assessment Lower Extremity Assessment: Overall WFL for tasks assessed(limited mobility is only due to the  "flush feelign she gets when she is up moving, not her strength. )       Communication   Communication: No difficulties  Cognition Arousal/Alertness: Awake/alert Behavior During Therapy: WFL for tasks assessed/performed Overall Cognitive Status: Within Functional Limits for tasks assessed                                        General Comments      Exercises     Assessment/Plan    PT Assessment Patient needs continued PT services  PT Problem List Decreased activity tolerance;Decreased mobility       PT Treatment Interventions Gait training;Functional mobility training;Therapeutic activities;Therapeutic exercise;Patient/family education    PT Goals (Current goals can be found in the Care Plan section)  Acute Rehab PT Goals Patient Stated Goal: I want to feel better and get moving.  PT Goal Formulation: With patient/family Time For Goal Achievement: 02/01/18 Potential to Achieve Goals: Good    Frequency Min 3X/week   Barriers to discharge        Co-evaluation               AM-PAC PT "6 Clicks" Daily Activity  Outcome Measure Difficulty turning over in bed (including adjusting bedclothes, sheets and blankets)?: None Difficulty moving from lying on back to sitting on the side of the bed? : None Difficulty sitting down on and standing up from a chair with arms (e.g., wheelchair, bedside commode, etc,.)?: A Little Help needed moving to and from a bed to chair (including a wheelchair)?: A Little Help needed walking in hospital room?: A Lot Help needed climbing 3-5 steps with a railing? : A Lot 6 Click Score: 18    End of Session   Activity Tolerance: Treatment limited secondary to medical complications (Comment) Patient left: in bed;with nursing/sitter in room(educated dtr and pateitn to NOT get up without nursing there due to this flush feeling and lightheadiness) Nurse Communication: Mobility status PT Visit Diagnosis: Unsteadiness on feet  (R26.81)    Time: 3734-2876 PT Time Calculation (min) (ACUTE ONLY): 48 min   Charges:   PT Evaluation $PT Eval Low Complexity: 1 Low PT Treatments $Gait Training: 8-22 mins $Therapeutic Exercise: 8-22 mins        Clide Dales, PT Pager: [phone] 01/18/2018   Johnnette Laux, Gatha Mayer 01/18/2018, 1:27 PM

## 2018-01-19 DIAGNOSIS — R748 Abnormal levels of other serum enzymes: Secondary | ICD-10-CM

## 2018-01-19 DIAGNOSIS — E0810 Diabetes mellitus due to underlying condition with ketoacidosis without coma: Secondary | ICD-10-CM

## 2018-01-19 LAB — BASIC METABOLIC PANEL
Anion gap: 8 (ref 5–15)
BUN: 14 mg/dL (ref 6–20)
CO2: 25 mmol/L (ref 22–32)
Calcium: 8.9 mg/dL (ref 8.9–10.3)
Chloride: 104 mmol/L (ref 98–111)
Creatinine, Ser: 0.7 mg/dL (ref 0.44–1.00)
GFR calc Af Amer: >60 mL/min (ref >60)
GFR calc non Af Amer: >60 mL/min (ref >60)
Glucose, Bld: 242 mg/dL — ABNORMAL HIGH (ref 70–99)
Potassium: 3.3 mmol/L — ABNORMAL LOW (ref 3.5–5.1)
Sodium: 137 mmol/L (ref 135–145)

## 2018-01-19 LAB — CBC
HCT: 34.9 % — ABNORMAL LOW (ref 36.0–46.0)
Hemoglobin: 11.3 g/dL — ABNORMAL LOW (ref 12.0–15.0)
MCH: 27 pg (ref 26.0–34.0)
MCHC: 32.4 g/dL (ref 30.0–36.0)
MCV: 83.5 fL (ref 78.0–100.0)
Platelets: 158 10*3/uL (ref 150–400)
RBC: 4.18 MIL/uL (ref 3.87–5.11)
RDW: 14.8 % (ref 11.5–15.5)
WBC: 10.3 10*3/uL (ref 4.0–10.5)

## 2018-01-19 LAB — GLUCOSE, CAPILLARY
Glucose-Capillary: 217 mg/dL — ABNORMAL HIGH (ref 70–99)
Glucose-Capillary: 242 mg/dL — ABNORMAL HIGH (ref 70–99)
Glucose-Capillary: 350 mg/dL — ABNORMAL HIGH (ref 70–99)
Glucose-Capillary: 273 mg/dL — ABNORMAL HIGH (ref 70–99)

## 2018-01-19 MED ORDER — ACETAMINOPHEN 650 MG RE SUPP: 650.0000 mg | Freq: Four times a day (QID) | RECTAL | Status: DC | PRN

## 2018-01-19 MED ORDER — METOCLOPRAMIDE HCL 5 MG/ML IJ SOLN
10.0000 mg | Freq: Once | INTRAMUSCULAR | Status: DC
  Filled 2018-01-19: qty 2

## 2018-01-19 MED ORDER — IBUPROFEN 200 MG PO TABS: 400.0000 mg | ORAL_TABLET | Freq: Four times a day (QID) | ORAL | Status: DC | PRN

## 2018-01-19 MED ORDER — LIVING WELL WITH DIABETES BOOK
Freq: Once | Status: AC
  Administered 2018-01-19: 1
  Filled 2018-01-19: qty 1

## 2018-01-19 MED ORDER — ACETAMINOPHEN 325 MG PO TABS: 650.0000 mg | ORAL_TABLET | Freq: Four times a day (QID) | ORAL | Status: DC | PRN

## 2018-01-19 MED ORDER — INSULIN GLARGINE 100 UNIT/ML ~~LOC~~ SOLN
30.0000 [IU] | Freq: Every day | SUBCUTANEOUS | Status: DC
  Administered 2018-01-19: 30 [IU] via SUBCUTANEOUS
  Filled 2018-01-19 (×2): qty 0.3

## 2018-01-19 MED ORDER — SODIUM CHLORIDE 0.9 % IV BOLUS
1000.0000 mL | Freq: Once | INTRAVENOUS | Status: AC
  Administered 2018-01-19: 1000 mL via INTRAVENOUS

## 2018-01-19 MED ORDER — POTASSIUM CHLORIDE CRYS ER 20 MEQ PO TBCR
40.0000 meq | EXTENDED_RELEASE_TABLET | Freq: Once | ORAL | Status: AC
  Administered 2018-01-19: 40 meq via ORAL
  Filled 2018-01-19: qty 2

## 2018-01-19 MED ORDER — TRAMADOL HCL 50 MG PO TABS
100.0000 mg | ORAL_TABLET | Freq: Once | ORAL | Status: DC
  Filled 2018-01-19: qty 2

## 2018-01-19 NOTE — Progress Notes (Signed)
Erin [name] is highly motivated to learn and apply information about diabetes.  She reports she has watched all DM videos on our patient education channel. She has been given the Living Well With DM booklet, discussed skills such as sick days, how to recognize and treat high and low blood sugars, demonstrates understanding of taking blood sugar, injections, skin care etc, Denies further questions at this time and verbalizes her anxiety of "doing this all alone when she is discharged.  She reports "not feeling confident".   She would like an order to attend our out patient diabetes program for support after discharge.  Will continue to reinforce information and and demonstrations

## 2018-01-19 NOTE — Progress Notes (Signed)
Patient ambulated around 360 feet in hallway with her daughter by her side. Pt also stood at window at end of hall for several minutes to look outside. Tolerated well.

## 2018-01-19 NOTE — Progress Notes (Signed)
OT Cancellation Note  Patient Details Name: Erin [name] MRN: [id number] DOB: [birth year]   Cancelled Treatment:    Pt reports she is I with ADL activity and kids will A as needed. Pt reports walking in the hall as well as going back and forth to bathroom. No OT needs per pt  Kari Baars, [place] Payton Mccallum D 01/19/2018, 9:25 AM

## 2018-01-19 NOTE — Progress Notes (Signed)
Awaiting IV Team placement of PIV after RN attempted unsuccessfully. Pt states her HA is "gone now" and she doesn't have the "heavniess" in the front of her head anymore. Pt refuses IV Reglan and Tramadol. States "only Xanax makes me feel normal again." PRN Xanax given per request.

## 2018-01-19 NOTE — Progress Notes (Signed)
PROGRESS NOTE    Erin [name]  [id number] DOB: [birth year] DOA: 01/15/2018 PCP: Binnie Rail, MD    Brief Narrative:  Patient is a pleasant 52 year old female history of temporal arteritis, gastroesophageal reflux disease, diabetes, hypothyroidism following radioiodine therapy, morbid obesity presented to the ED with shortness of breath times several months worse over the past 3 weeks when prednisone taper had begun.  Shortness of breath worse on mild exertion with ambulation and even on standing.  Patient also noted to have lower extremity swelling, redness.  No cough.  Patient also did complain of some intermittent midsternal chest pain last episode 2 days prior to admission.  On admission noted to be tachycardic, systolic blood pressure of 116, glucose of 732, anion gap of 20, bicarb of 20.  Patient admitted for DKA and placed on the glucose stabilizer.   Assessment & Plan:   Principal Problem:   Hyperosmolar non-ketotic state in patient with type 2 diabetes mellitus ([place]) Active Problems:   DKA (diabetic ketoacidoses) ([place])   Hypothyroidism following radioiodine therapy   Morbid obesity ([facility])   Palpitations   Hypertension   Temporal arteritis ([place])   Gastroesophageal reflux disease   Oral thrush   Vaginal candidiasis   Diabetes mellitus type 2, uncomplicated ([place])  #1 hyperosmolar nonketotic state in type 2 diabetes mellitus/newly diagnosed diabetes mellitus Patient presenting with hyperosmolar nonketotic hyperglycemia and newly diagnosed diabetes mellitus recently in June 2019.  CBG on admission was 732 with a anion gap of 20 with a bicarb of 20.  Questionable etiology.  Cardiac enzymes negative x3.  Patient with some atypical chest pain.  No signs or symptoms of infection.  Urinalysis nitrite negative leukocytes negative.  Patient admitted placed on the glucose stabilizer anion gap is closed.  Hemoglobin A1c 12.7.  Patient initially transitioned to Lantus 20 units daily.   Patient was maintained on sliding scale insulin.  Patient was noted to have a CBG of 490 at 1634 hrs. on 01/16/2018 and given 20 units of NovoLog.  Lantus dose was adjusted to 25 units daily however patient did not receive Lantus the night 01/16/2018.  Continue resistant sliding scale insulin.  Lantus scheduled to be given in the mornings which was started on 01/17/2018.  CBG this morning was 159.  Increase Lantus to 30 units daily.  Continue sliding scale insulin. Increased meal coverage insulin to 9 units 3 times daily with meals.  Diabetic coordinator following. Will need outpatient follow-up with her endocrinologist.  Patient will need insulin on discharge.  2.  Dehydration We will give a bolus of normal saline.  Follow.    3.  Oral thrush/vaginal candidiasis Likely secondary to oral steroid treatment.  Improving clinically.  Continue current regimen of oral nystatin swish and swallow, Diflucan, clotrimazole vaginal cream.  Status post 1 dose IV fluconazole on 01/16/2018. Follow.  4.  Gastroesophageal reflux disease Continue PPI.  5.  Atypical chest pain Patient with complaints of midsternal nonradiating sharp chest pain which she felt was likely secondary to gastroesophageal reflux disease.  Patient with oral thrush.  Patient on chronic prednisone.  Cardiac enzymes negative x3.  2D echo with a EF of 55 to 60%, no wall motion abnormalities, grade 1 diastolic dysfunction.   Concern for possible esophageal candidiasis versus reflux.  Continue oral nystatin swish and swallow, Diflucan, PPI.Follow.  6.  Loose stools Likely secondary to metformin.  Improving per patient.  Follow.    7.  Acute kidney injury Creatinine on admission was 1.3.  Improved with hydration.  Monitor closely.  8.  Hypertension Systolic blood pressure noted to be in the 98/60 early this morning. Continue Lopressor to 25 mg twice daily.  Discontinued Cozaar.    9.  Temporal arteritis Per admission H&P noted that the diagnosis  was in question by rheumatologist.  Patient on chronic prednisone which we will continue and defer further taper to rheumatologist.  Follow.  10.  Elevated liver enzymes Likely secondary to hepatic steatosis. Right upper quadrant ultrasound status post cholecystectomy with hepatic steatosis.  Fasting lipid panel with a LDL of 130.  Continue statin.    11.  Hypothyroidism status post radioiodine therapy for Graves' disease Patient's endocrinologist recently reduced Synthroid dose from 137-1 25 MCG's.  TSH 0.2 improved from 0.1.  T4 on 11/27/2017 was within normal limits at 1.14.  Continue current dose of Synthroid.  Outpatient follow-up with endocrinologist.  12.  Palpitations Questionable etiology.  May be secondary to dehydration.  Patient status post radioiodine therapy for Graves' disease.  Patient hydrated with IV fluids.  Increase Lopressor to 25 mg twice daily.  Follow.    13.  Hypokalemia Replete.  Magnesium level 2.0.     DVT prophylaxis: Lovenox Code Status: Full Family Communication: Updated patient and family at bedside. Disposition Plan: Home with home health once medically stable with clinical improvement and improvement with blood glucose levels hopefully in the next 24 to 48 hours.     Consultants:   None  Procedures:   CXR 01/15/2018  Right upper quadrant ultrasound 01/15/2018  2D echo 01/16/2018  Antimicrobials:   None   Subjective: Patient sleeping however arousable.  Complains of headache and some lightheadedness.  No chest pain.  No shortness of breath.    Objective: Vitals:   01/18/18 2100 01/19/18 0631 01/19/18 0952 01/19/18 0953  BP: 123/88 98/60 118/68   Pulse: (!) 101 81    Resp: (!) 22 14    Temp: 98.4 F (36.9 C) 97.9 F (36.6 C)    TempSrc: Oral Oral    SpO2: 95% 100%  99%  Weight:  (!) 151.6 kg (334 lb 3.2 oz)    Height:        Intake/Output Summary (Last 24 hours) at 01/19/2018 1114 Last data filed at 01/19/2018 0950 Gross per 24 hour   Intake 1840 ml  Output 1000 ml  Net 840 ml   Filed Weights   01/17/18 0741 01/18/18 0700 01/19/18 0631  Weight: (!) 148.4 kg (327 lb 2.6 oz) (!) 149.5 kg (329 lb 8 oz) (!) 151.6 kg (334 lb 3.2 oz)    Examination:  General exam: Oral thrush. Respiratory system: Lungs CTAB. No wheezes, no crackles, no rhonchi.  Normal respiratory effort.   Cardiovascular system: RRR. No m/r/g. No JVD. No LE edema.  Gastrointestinal system: Abdomen is soft/NT/ND/+BS. No rebound.  No guarding.  Central nervous system: Alert and oriented. No focal neurological deficits. Extremities: Symmetric 5 x 5 power. Skin: No rashes, lesions or ulcers Psychiatry: Judgement and insight appear normal. Mood & affect appropriate.     Data Reviewed: I have personally reviewed following labs and imaging studies  CBC: Recent Labs  Lab 01/15/18 1815 01/16/18 0400 01/17/18 0325 01/18/18 0458 01/19/18 0408  WBC 11.6* 13.1* 11.0* 10.9* 10.3  NEUTROABS 9.7*  --  5.5 5.3  --   HGB 14.6 14.2 12.2 11.7* 11.3*  HCT 45.4 44.0 38.1 36.3 34.9*  MCV 85.2 84.6 85.0 83.8 83.5  PLT 199 176 157 172 158  Basic Metabolic Panel: Recent Labs  Lab 01/16/18 1706 01/16/18 2208 01/17/18 0325 01/18/18 0458 01/19/18 0408  NA 141 140 144 138 137  K 4.1 3.4* 3.1* 3.1* 3.3*  CL 106 109 111 105 104  CO2 23 24 25 24 25   GLUCOSE 542* 318* 138* 193* 242*  BUN 22* 18 17 10 14   CREATININE 0.91 0.73 0.73 0.78 0.70  CALCIUM 8.9 8.4* 8.5* 8.7* 8.9  MG  --   --  2.2 2.0  --    GFR: Estimated Creatinine Clearance: 124.5 mL/min (by C-G formula based on SCr of 0.7 mg/dL). Liver Function Tests: Recent Labs  Lab 01/15/18 1815 01/17/18 0325 01/18/18 0458  AST 48* 22 22  ALT 96* 61* 60*  ALKPHOS 199* 129* 137*  BILITOT 2.3* 1.2 1.4*  PROT 7.3 5.7* 5.8*  ALBUMIN 4.0 3.0* 3.1*   No results for input(s): LIPASE, AMYLASE in the last 168 hours. No results for input(s): AMMONIA in the last 168 hours. Coagulation Profile: No results  for input(s): INR, PROTIME in the last 168 hours. Cardiac Enzymes: Recent Labs  Lab 01/15/18 1815 01/15/18 2155 01/16/18 0400 01/16/18 0950  CKTOTAL 61  --   --   --   TROPONINI  --  <0.03 <0.03 <0.03   BNP (last 3 results) No results for input(s): PROBNP in the last 8760 hours. HbA1C: No results for input(s): HGBA1C in the last 72 hours. CBG: Recent Labs  Lab 01/18/18 1131 01/18/18 1646 01/18/18 2058 01/19/18 0739 01/19/18 1107  GLUCAP 290* 365* 317* 217* 242*   Lipid Profile: Recent Labs    01/18/18 0458  CHOL 224*  HDL 65  LDLCALC 130*  TRIG 145  CHOLHDL 3.4   Thyroid Function Tests: No results for input(s): TSH, T4TOTAL, FREET4, T3FREE, THYROIDAB in the last 72 hours. Anemia Panel: No results for input(s): VITAMINB12, FOLATE, FERRITIN, TIBC, IRON, RETICCTPCT in the last 72 hours. Sepsis Labs: No results for input(s): PROCALCITON, LATICACIDVEN in the last 168 hours.  Recent Results (from the past 240 hour(s))  MRSA PCR Screening     Status: None   Collection Time: 01/16/18  7:42 AM  Result Value Ref Range Status   MRSA by PCR NEGATIVE NEGATIVE Final    Comment:        The GeneXpert MRSA Assay (FDA approved for NASAL specimens only), is one component of a comprehensive MRSA colonization surveillance program. It is not intended to diagnose MRSA infection nor to guide or monitor treatment for MRSA infections. Performed at [facility], [facility] [address], [place], [place] [address]          Radiology Studies: No results found.      Scheduled Meds: . atorvastatin  40 mg Oral q1800  . clotrimazole  1 Applicatorful Vaginal QHS  . enoxaparin (LOVENOX) injection  70 mg Subcutaneous Daily  . fluconazole  100 mg Oral Daily  . insulin aspart  0-20 Units Subcutaneous TID WC  . insulin aspart  0-5 Units Subcutaneous QHS  . insulin aspart  9 Units Subcutaneous TID WC  . insulin glargine  30 Units Subcutaneous Daily  . levothyroxine   125 mcg Oral QAC breakfast  . metoCLOPramide (REGLAN) injection  10 mg Intravenous Once  . metoprolol tartrate  25 mg Oral BID  . nystatin  5 mL Oral QID  . pantoprazole  40 mg Oral Q breakfast  . predniSONE  50 mg Oral Q breakfast  . traMADol  100 mg Oral Once   Continuous  Infusions: . sodium chloride       LOS: 4 days    Time spent: 40 minutes    Irine Seal, MD [facility] Pager [phone]  If 7PM-7AM, please contact night-coverage [email or web address] Password [email or web address] 01/19/2018, 11:14 AM

## 2018-01-19 NOTE — Progress Notes (Signed)
Pt ambulated around 360 feet in hallway with standby assist. Stated she feels "much better" and "that tunneling feeling in my head is gone."

## 2018-01-20 DIAGNOSIS — R748 Abnormal levels of other serum enzymes: Secondary | ICD-10-CM

## 2018-01-20 LAB — BASIC METABOLIC PANEL
Anion gap: 10 (ref 5–15)
BUN: 14 mg/dL (ref 6–20)
CO2: 23 mmol/L (ref 22–32)
Calcium: 8.6 mg/dL — ABNORMAL LOW (ref 8.9–10.3)
Chloride: 102 mmol/L (ref 98–111)
Creatinine, Ser: 1.05 mg/dL — ABNORMAL HIGH (ref 0.44–1.00)
GFR calc Af Amer: >60 mL/min (ref >60)
GFR calc non Af Amer: >60 mL/min (ref >60)
Glucose, Bld: 290 mg/dL — ABNORMAL HIGH (ref 70–99)
Potassium: 3.4 mmol/L — ABNORMAL LOW (ref 3.5–5.1)
Sodium: 135 mmol/L (ref 135–145)

## 2018-01-20 LAB — GLUCOSE, CAPILLARY
Glucose-Capillary: 286 mg/dL — ABNORMAL HIGH (ref 70–99)
Glucose-Capillary: 332 mg/dL — ABNORMAL HIGH (ref 70–99)

## 2018-01-20 MED ORDER — FLUCONAZOLE 100 MG PO TABS: 100.0000 mg | ORAL_TABLET | Freq: Every day | ORAL | 0 refills | Status: AC

## 2018-01-20 MED ORDER — IBUPROFEN 400 MG PO TABS: 400.0000 mg | ORAL_TABLET | Freq: Four times a day (QID) | ORAL | 0 refills | Status: DC | PRN

## 2018-01-20 MED ORDER — "PEN NEEDLES 3/16"" 31G X 5 MM MISC": 12.0000 [IU] | Freq: Three times a day (TID) | 0 refills | Status: DC

## 2018-01-20 MED ORDER — INSULIN ASPART 100 UNIT/ML FLEXPEN: 12.0000 [IU] | PEN_INJECTOR | Freq: Three times a day (TID) | SUBCUTANEOUS | 0 refills | Status: DC

## 2018-01-20 MED ORDER — INSULIN GLARGINE 100 UNIT/ML ~~LOC~~ SOLN
34.0000 [IU] | Freq: Every day | SUBCUTANEOUS | Status: DC
  Administered 2018-01-20: 34 [IU] via SUBCUTANEOUS
  Filled 2018-01-20: qty 0.34

## 2018-01-20 MED ORDER — CLOTRIMAZOLE 1 % VA CREA: 1.0000 | TOPICAL_CREAM | Freq: Every day | VAGINAL | 0 refills | Status: DC

## 2018-01-20 MED ORDER — POTASSIUM CHLORIDE CRYS ER 20 MEQ PO TBCR
40.0000 meq | EXTENDED_RELEASE_TABLET | Freq: Once | ORAL | Status: AC
  Administered 2018-01-20: 40 meq via ORAL
  Filled 2018-01-20: qty 2

## 2018-01-20 MED ORDER — METOPROLOL TARTRATE 25 MG PO TABS: 25.0000 mg | ORAL_TABLET | Freq: Two times a day (BID) | ORAL | 0 refills | Status: DC

## 2018-01-20 MED ORDER — INSULIN GLARGINE 100 UNIT/ML SOLOSTAR PEN: 34.0000 [IU] | PEN_INJECTOR | Freq: Every day | SUBCUTANEOUS | 0 refills | Status: DC

## 2018-01-20 MED ORDER — INSULIN ASPART 100 UNIT/ML ~~LOC~~ SOLN
12.0000 [IU] | Freq: Three times a day (TID) | SUBCUTANEOUS | Status: DC
  Administered 2018-01-20 (×2): 12 [IU] via SUBCUTANEOUS

## 2018-01-20 MED ORDER — ATORVASTATIN CALCIUM 40 MG PO TABS: 40.0000 mg | ORAL_TABLET | Freq: Every day | ORAL | 0 refills | Status: DC

## 2018-01-20 MED ORDER — LEVOTHYROXINE SODIUM 125 MCG PO TABS: 125.0000 ug | ORAL_TABLET | Freq: Every day | ORAL | 1 refills | Status: DC

## 2018-01-20 NOTE — Progress Notes (Signed)
Pt A&O, denies pain, NAD, all meds administered, daughter at bedside. Discharge teaching provided, discussed home care, upcoming appointments, apps needed to follow up.  Educated on all medications/prescriptions given. Diabetes and insulin education provided, pt verbalized understanding, daughter at bedside and verbalized understanding as well. All questions answered, IV removed no complications, pt gathered belongings, called for ride. Husband to pick wife and daughter up, pt stable and NAD when last seen.

## 2018-01-20 NOTE — Progress Notes (Signed)
PT Cancellation Note  Patient Details Name: Erin [name] MRN: [id number] DOB: 05/23/1966   Cancelled Treatment:    Reason Eval/Treat Not Completed: Other (comment)(pt reports she's been ambulating well without assistance and that she's [place] home today, she stated she doesn't need PT. Will sign off. )   Philomena Doheny 01/20/2018, 11:19 AM [phone]

## 2018-01-20 NOTE — Care Management Note (Signed)
Patient recc for HHC-provided patient w/HHC agency list for choice-chose [facility] rep Santiago Glad aware of d/c & HHC orders. No further CM needs.

## 2018-01-20 NOTE — Discharge Summary (Signed)
Physician Discharge Summary  Erin [name] [id number] DOB: 1966/01/26 DOA: 01/15/2018  PCP: Binnie Rail, MD  Admit date: 01/15/2018 Discharge date: 01/20/2018  Time spent: 60 minutes  Recommendations for Outpatient Follow-up:  1. Follow-up with Binnie Rail, MD in 2 weeks.  On follow-up patient's blood pressure need to be reassessed.  Patient's diabetes will need to be followed and reassess.  Patient started on insulin during this hospitalization.  Patient will need comprehensive metabolic profile done to follow-up on electrolytes, renal function, LFTs. 2. Follow-up with endocrinologist as previously scheduled for follow-up on recently diagnosed diabetes.   Discharge Diagnoses:  Principal Problem:   Hyperosmolar non-ketotic state in patient with type 2 diabetes mellitus ([place]) Active Problems:   DKA (diabetic ketoacidoses) ([place])   Hypothyroidism following radioiodine therapy   Morbid obesity ([facility])   Palpitations   Hypertension   Temporal arteritis ([place])   Gastroesophageal reflux disease   Oral thrush   Vaginal candidiasis   Diabetes mellitus type 2, uncomplicated ([facility])   Elevated liver enzymes   Discharge Condition: Stable and improved  Diet recommendation: Carb modified diet.  Filed Weights   01/18/18 0700 01/19/18 0631 01/20/18 0616  Weight: (!) 149.5 kg (329 lb 8 oz) (!) 151.6 kg (334 lb 3.2 oz) (!) 152.7 kg (336 lb 11.2 oz)    History of present illness:  Per Dr. Jola Baptist is a 52 y.o. female with medical history significant for temporal arteritis, GERD, DM, hypothyroidism following radioiodine therapy, morbid obesity, who presented to the ED with  C/o SOB of several months, but worse over the past 3 weeks when her prednisone taper began. SOB is worse with mild exertion, like ambulating few feet, even with standing. She denied lower extremity swelling, redness or pain, she denies cough. Patient also reported chest pain intermittent and palpitations  over several months, last episode of chest pain was 2 days ago, she was trying to get up from a lying position at 3am in the morning, lasted ~ 1-2 secs, central chest mostly, radiating down left arm. She has always attributed this pain to her GERD.   Never smoker, no alcohol, denies illicit drug use.  She denied premature family history of attacks. Patient was on chronic prednisone for temporal arteritis, the diagnoses of which is being questioned by her rheumatologist.   Patient was started on metformin a few weeks ago, but she has been having loose stools since then.  She also reported difficulty swallowing as a result of thrush for which she is on fluconazole.  Patient saw her rheumatologist today, and was sent to the ED. her rheumatologist recommended ESR CRP and CK check.  ED Course: Tachycardia 116- 124, respiratory rate 18, blood pressure systolic 672, O2 sats greater than 92% on room air.  Glucose elevated 732, anion gap - 20, bicarb - 20, creatinine elevated from baseline 1.3, BUN- 25,  Elevated liver enzymes AST ALT ALP total bili. WBC- 11.6,. UA-glucose ketones large hemoglobin. TSH low 0.28. Two-view chest x-ray negative for acute abnormality. ESR- 8, Ck- 61. EDP attempted contacting rheumatologist office but it was closed. Hospitalist was called to admit for DKA.    Hospital Course:  1 hyperosmolar nonketotic state in type 2 diabetes mellitus/newly diagnosed diabetes mellitus Patient presented with hyperosmolar nonketotic hyperglycemia and newly diagnosed diabetes mellitus recently in June 2019.  CBG on admission was 732 with a anion gap of 20 with a bicarb of 20.  Questionable etiology.  Cardiac enzymes negative x3.  Patient with some atypical chest pain.  No signs or symptoms of infection.  Urinalysis nitrite negative leukocytes negative.  Patient admitted placed on the glucose stabilizer anion gap is closed.  Hemoglobin A1c 12.7.  Patient initially transitioned to Lantus 20 units  daily.  Patient was maintained on sliding scale insulin.  Patient was noted to have a CBG of 490 at 1634 hrs. on 01/16/2018 and given 20 units of NovoLog.  Lantus dose was adjusted to 25 units daily however patient did not receive Lantus the night 01/16/2018.    Patient maintained on resistant sliding scale insulin.  Lantus scheduled to be given in the mornings which was started on 01/17/2018.    Lantus dose adjusted for better blood glucose control.  Patient was also maintained on meal coverage insulin at 9 units 3 times daily which was increased to 12 units 3 times daily on discharge.  Lantus was increased to 34 units on day of discharge.  Patient will follow-up with PCP and endocrinology in the outpatient setting.   2.  Dehydration Hydrated with IV fluids.  Was euvolemic by day of discharge.   3.  Oral thrush/vaginal candidiasis Likely secondary to oral steroid treatment.  Patient was placed on oral nystatin swish and swallow, Diflucan, clotrimazole vaginal cream with clinical improvement.  Outpatient follow-up.  4.  Gastroesophageal reflux disease Patient maintained on a PPI.    5.  Atypical chest pain Patient with complaints of midsternal nonradiating sharp chest pain which she felt was likely secondary to gastroesophageal reflux disease on admission.  Patient with oral thrush.  Patient on chronic prednisone.  Cardiac enzymes negative x3.  2D echo with a EF of 55 to 60%, no wall motion abnormalities, grade 1 diastolic dysfunction.   Concern for possible esophageal candidiasis versus reflux.  Patient was placed on oral nystatin swish and swallow as well as Diflucan and a PPI with clinical improvement.  Patient with no further chest pain throughout the hospitalization.  Outpatient follow-up.   6.  Loose stools Likely secondary to metformin.    Metformin was discontinued.  Resolution of loose stools.  7.  Acute kidney injury Creatinine on admission was 1.3.  Improved with hydration.   8.   Hypertension On admission patient noted to have borderline blood pressure.  As patient's hyperosmolar nonketotic hyperglycemic state improved blood pressure improved.  Patient was initially started back on her home dose Cozaar and Lopressor added for tachycardia.  Blood pressure decreased and because it was subsequently discontinued.  Patient was maintained on Lopressor and dose increased to 25 mg twice daily for better blood pressure control.  Outpatient follow-up.    9.  Temporal arteritis Per admission H&P noted that the diagnosis was in question by rheumatologist.  Patient on chronic prednisone which was continued during the hospitalization.  Outpatient follow-up for further taper to rheumatologist.  10.  Elevated liver enzymes likely secondary to hepatic steatosis Likely secondary to hepatic steatosis. Right upper quadrant ultrasound status post cholecystectomy with hepatic steatosis.  Fasting lipid panel with a LDL of 130.    Patient started on a statin.    11.  Hypothyroidism status post radioiodine therapy for Graves' disease Patient's endocrinologist recently reduced Synthroid dose from 137-125 MCG's.  TSH 0.2 improved from 0.1.  T4 on 11/27/2017 was within normal limits at 1.14.    Patient maintained on home dose of Synthroid. Outpatient follow-up with endocrinologist.  12.  Palpitations Questionable etiology.  May be secondary to dehydration.  Patient status post radioiodine  therapy for Graves' disease.  Patient hydrated with IV fluids.  Patient placed on Lopressor and dose adjusted for tachycardia.  Palpitations improved.  Outpatient follow-up.   13.  Hypokalemia Repleted.  Magnesium level 2.0.      Procedures:  CXR 01/15/2018  Right upper quadrant ultrasound 01/15/2018  2D echo 01/16/2018      Consultations:  None  Discharge Exam: Vitals:   01/20/18 0616 01/20/18 0901  BP: 137/89 (!) 145/107  Pulse: 90 98  Resp: 12   Temp: 98 F (36.7 C)   SpO2: 99%      General: NAD Cardiovascular: RRR Respiratory: CTAB  Discharge Instructions   Discharge Instructions    Diet Carb Modified   Complete by:  As directed    Increase activity slowly   Complete by:  As directed      Allergies as of 01/20/2018      Reactions   Prozac [fluoxetine Hcl] Nausea Only   Hydrocodone Hives, Itching, Rash   On thighs      Medication List    STOP taking these medications   clotrimazole 10 MG troche Commonly known as:  MYCELEX   losartan 100 MG tablet Commonly known as:  COZAAR   metFORMIN 500 MG 24 hr tablet Commonly known as:  GLUCOPHAGE XR     TAKE these medications   ALPRAZolam 0.5 MG tablet Commonly known as:  XANAX Take 1 tablet (0.5 mg total) by mouth 2 (two) times daily as needed for anxiety.   antiseptic oral rinse Liqd 15 mLs by Mouth Rinse route as needed for dry mouth or mouth pain.   atorvastatin 40 MG tablet Commonly known as:  LIPITOR Take 1 tablet (40 mg total) by mouth daily at 6 PM.   blood glucose meter kit and supplies Kit Dispense based on patient and insurance preference. Use up to twice daily.   cholecalciferol 1000 units tablet Commonly known as:  VITAMIN D Take 1,000 Units by mouth daily.   clotrimazole 1 % vaginal cream Commonly known as:  GYNE-LOTRIMIN Place 1 Applicatorful vaginally at bedtime.   CONTOUR TEST test strip Generic drug:  glucose blood TEST TWICE DAILY Notes to patient:  Can start tonight.   fluconazole 100 MG tablet Commonly known as:  DIFLUCAN Take 1 tablet (100 mg total) by mouth daily for 5 days.   ibuprofen 400 MG tablet Commonly known as:  ADVIL,MOTRIN Take 1 tablet (400 mg total) by mouth every 6 (six) hours as needed for fever, headache or mild pain (use first).   insulin aspart 100 UNIT/ML FlexPen Commonly known as:  NOVOLOG FLEXPEN Inject 12 Units into the skin 3 (three) times daily with meals.   Insulin Glargine 100 UNIT/ML Solostar Pen Commonly known as:  LANTUS  SOLOSTAR Inject 34 Units into the skin daily.   levothyroxine 125 MCG tablet Commonly known as:  SYNTHROID, LEVOTHROID Take 1 tablet (125 mcg total) by mouth daily before breakfast. Start taking on:  01/21/2018 What changed:    medication strength  See the new instructions.  Another medication with the same name was removed. Continue taking this medication, and follow the directions you see here.   metoprolol tartrate 25 MG tablet Commonly known as:  LOPRESSOR Take 1 tablet (25 mg total) by mouth 2 (two) times daily.   MIRENA (52 MG) 20 MCG/24HR IUD Generic drug:  levonorgestrel Mirena 20 mcg/24 hr (5 years) intrauterine device  Take 1 device by intrauterine route.   nystatin 100000 UNIT/ML suspension Commonly known as:  MYCOSTATIN Take 5 mLs (500,000 Units total) by mouth 4 (four) times daily.   ondansetron 4 MG disintegrating tablet Commonly known as:  ZOFRAN ODT Take 1 tablet (4 mg total) by mouth every 8 (eight) hours as needed for nausea or vomiting.   pantoprazole 40 MG tablet Commonly known as:  PROTONIX Take 1 tablet (40 mg total) by mouth daily. Take 30 minutes prior to a meal   Pen Needles 3/16" 31G X 5 MM Misc 12 Units by Does not apply route 3 (three) times daily before meals. Notes to patient:  Use with insulin administration.   predniSONE 10 MG tablet Commonly known as:  DELTASONE Take 50 mg by mouth daily with breakfast.      Allergies  Allergen Reactions  . Prozac [Fluoxetine Hcl] Nausea Only  . Hydrocodone Hives, Itching and Rash    On thighs    Follow-up Information    [facility] Follow up.   Specialty:  [place] Why:  [facility] nursing/physical therapy/occupational therapy/aide Contact information: [place] [address] [phone]        Binnie Rail, MD. Schedule an appointment as soon as possible for a visit in 2 week(s).   Specialty:  Internal Medicine Contact information: [place] [address] [phone]        endocrinologist Follow up.   Why:  f/u as scheduled.           The results of significant diagnostics from this hospitalization (including imaging, microbiology, ancillary and laboratory) are listed below for reference.    Significant Diagnostic Studies: Dg Chest 2 View  Result Date: 01/15/2018 CLINICAL DATA:  Weakness, dyspnea, fatigue for 2 weeks. EXAM: CHEST - 2 VIEW COMPARISON:  Chest x-ray dated 08/17/2016. FINDINGS: The heart size and mediastinal contours are within normal limits. Both lungs are clear. The visualized skeletal structures are unremarkable. IMPRESSION: No active cardiopulmonary disease. No evidence of pneumonia or pulmonary edema. Electronically Signed   By: Franki Cabot M.D.   On: 01/15/2018 17:33   US Abdomen Limited Ruq  Result Date: 01/15/2018 CLINICAL DATA:  Transaminitis EXAM: ULTRASOUND ABDOMEN LIMITED RIGHT UPPER QUADRANT COMPARISON:  None. FINDINGS: Gallbladder: Gallbladder is surgically absent. The gallbladder fossa was imaged and is unremarkable. Common bile duct: Diameter: 1.8 mm Liver: Increased hepatic echogenicity consistent with steatosis. Portal vein is patent on color Doppler imaging with normal direction of blood flow towards the liver. IMPRESSION: 1. Status post cholecystectomy. 2. Hepatic steatosis. Electronically Signed   By: Ulyses Jarred M.D.   On: 01/15/2018 23:59    Microbiology: Recent Results (from the past 240 hour(s))  MRSA PCR Screening     Status: None   Collection Time: 01/16/18  7:42 AM  Result Value Ref Range Status   MRSA by PCR NEGATIVE NEGATIVE Final    Comment:        The GeneXpert MRSA Assay (FDA approved for NASAL specimens only), is one component of a comprehensive MRSA colonization surveillance program. It is not intended to diagnose MRSA infection nor to guide or monitor treatment for MRSA infections. Performed at [facility], [place]  [address] [place], [place] [address]      Labs: Basic Metabolic Panel: Recent Labs  Lab 01/16/18 2208 01/17/18 0325 01/18/18 0458 01/19/18 0408 01/20/18 0457  NA 140 144 138 137 135  K 3.4* 3.1* 3.1* 3.3* 3.4*  CL 109 111 105 104 102  CO2 24 25 24 25  23  GLUCOSE 318* 138* 193* 242* 290*  BUN 18 17 10 14 14   CREATININE 0.73 0.73 0.78 0.70 1.05*  CALCIUM 8.4* 8.5* 8.7* 8.9 8.6*  MG  --  2.2 2.0  --   --    Liver Function Tests: Recent Labs  Lab 01/15/18 1815 01/17/18 0325 01/18/18 0458  AST 48* 22 22  ALT 96* 61* 60*  ALKPHOS 199* 129* 137*  BILITOT 2.3* 1.2 1.4*  PROT 7.3 5.7* 5.8*  ALBUMIN 4.0 3.0* 3.1*   No results for input(s): LIPASE, AMYLASE in the last 168 hours. No results for input(s): AMMONIA in the last 168 hours. CBC: Recent Labs  Lab 01/15/18 1815 01/16/18 0400 01/17/18 0325 01/18/18 0458 01/19/18 0408  WBC 11.6* 13.1* 11.0* 10.9* 10.3  NEUTROABS 9.7*  --  5.5 5.3  --   HGB 14.6 14.2 12.2 11.7* 11.3*  HCT 45.4 44.0 38.1 36.3 34.9*  MCV 85.2 84.6 85.0 83.8 83.5  PLT 199 176 157 172 158   Cardiac Enzymes: Recent Labs  Lab 01/15/18 1815 01/15/18 2155 01/16/18 0400 01/16/18 0950  CKTOTAL 61  --   --   --   TROPONINI  --  <0.03 <0.03 <0.03   BNP: BNP (last 3 results) No results for input(s): BNP in the last 8760 hours.  ProBNP (last 3 results) No results for input(s): PROBNP in the last 8760 hours.  CBG: Recent Labs  Lab 01/19/18 1107 01/19/18 1635 01/19/18 2203 01/20/18 0818 01/20/18 1227  GLUCAP 242* 350* 273* 286* 332*       Signed:  Irine Seal MD.  [facility] 01/20/2018, 4:42 PM

## 2018-01-20 NOTE — Discharge Instructions (Signed)

## 2018-01-22 ENCOUNTER — Telehealth: Payer: Self-pay | Admitting: Emergency Medicine

## 2018-01-22 NOTE — Telephone Encounter (Signed)
Stop for now and we will discuss at her next appt

## 2018-01-22 NOTE — Telephone Encounter (Signed)
Pt informed of below.  

## 2018-01-22 NOTE — Telephone Encounter (Signed)
Copied from [place] [id number]. Topic: Quick Communication - See Telephone Encounter >> Jan 22, 2018  2:19 PM Hewitt Shorts wrote: Pt is calling to let Dr. Quay Burow know that the lipitor maks her feel bad -makes her feel like she is going to have  an up set stomach and would like something else called in if possible   Best number [phone]

## 2018-01-23 DIAGNOSIS — E785 Hyperlipidemia, unspecified: Secondary | ICD-10-CM | POA: Insufficient documentation

## 2018-01-23 NOTE — Progress Notes (Signed)
Subjective:    Patient ID: Erin [name], female    DOB: 1965-09-24, 52 y.o.   MRN: [id number]  HPI      Medications and allergies reviewed with patient and updated if appropriate.  Patient Active Problem List   Diagnosis Date Noted  . Elevated liver enzymes   . Oral thrush 01/16/2018  . Vaginal candidiasis 01/16/2018  . Diabetes mellitus type 2, uncomplicated ([place]) 78/93/8101  . Hyperosmolar non-ketotic state in patient with type 2 diabetes mellitus ([place]) 01/16/2018  . DKA (diabetic ketoacidoses) ([place]) 01/15/2018  . Left facial pressure and pain 11/09/2017  . Thrush 11/09/2017  . Gastroesophageal reflux disease 10/05/2017  . Temporal arteritis ([place]) 10/03/2017  . Right foot pain 07/15/2017  . Grief at loss of child 11/02/2016  . H/O gestational diabetes mellitus, not currently pregnant 09/28/2015  . Hypertension 09/28/2015  . Prediabetes 09/28/2015  . Morbid obesity ([place]) 05/17/2015  . Umbilical hernia 75/03/2584  . Chronic lower back pain 05/17/2015  . Snoring 05/17/2015  . Palpitations 05/17/2015  . ANEMIA 09/03/2007  . Hypothyroidism following radioiodine therapy 09/02/2007  . INTERSTITIAL CYSTITIS 09/01/2007  . ALLERGIC RHINITIS 08/07/2007  . [place] DISEASE, LUMBAR 08/07/2007    Current Outpatient Medications on File Prior to Visit  Medication Sig Dispense Refill  . ALPRAZolam (XANAX) 0.5 MG tablet Take 1 tablet (0.5 mg total) by mouth 2 (two) times daily as needed for anxiety.  0  . antiseptic oral rinse (BIOTENE) LIQD 15 mLs by Mouth Rinse route as needed for dry mouth or mouth pain.    [name] atorvastatin (LIPITOR) 40 MG tablet Take 1 tablet (40 mg total) by mouth daily at 6 PM. 30 tablet 0  . blood glucose meter kit and supplies KIT Dispense based on patient and insurance preference. Use up to twice daily. 1 each 0  . cholecalciferol (VITAMIN D) 1000 units tablet Take 1,000 Units by mouth daily.    . clotrimazole (GYNE-LOTRIMIN) 1 % vaginal cream Place 1  Applicatorful vaginally at bedtime. 45 g 0  . CONTOUR TEST test strip TEST TWICE DAILY 100 each 1  . fluconazole (DIFLUCAN) 100 MG tablet Take 1 tablet (100 mg total) by mouth daily for 5 days. 5 tablet 0  . ibuprofen (ADVIL,MOTRIN) 400 MG tablet Take 1 tablet (400 mg total) by mouth every 6 (six) hours as needed for fever, headache or mild pain (use first). 30 tablet 0  . insulin aspart (NOVOLOG FLEXPEN) 100 UNIT/ML FlexPen Inject 12 Units into the skin 3 (three) times daily with meals. 15 mL 0  . Insulin Glargine (LANTUS SOLOSTAR) 100 UNIT/ML Solostar Pen Inject 34 Units into the skin daily. 15 mL 0  . Insulin Pen Needle (PEN NEEDLES 3/16") 31G X 5 MM MISC 12 Units by Does not apply route 3 (three) times daily before meals. 100 each 0  . levonorgestrel (MIRENA, 52 MG,) 20 MCG/24HR IUD Mirena 20 mcg/24 hr (5 years) intrauterine device  Take 1 device by intrauterine route.    [name] levothyroxine (SYNTHROID, LEVOTHROID) 125 MCG tablet Take 1 tablet (125 mcg total) by mouth daily before breakfast. 30 tablet 1  . metoprolol tartrate (LOPRESSOR) 25 MG tablet Take 1 tablet (25 mg total) by mouth 2 (two) times daily. 60 tablet 0  . nystatin (MYCOSTATIN) 100000 UNIT/ML suspension Take 5 mLs (500,000 Units total) by mouth 4 (four) times daily. 200 mL 0  . ondansetron (ZOFRAN ODT) 4 MG disintegrating tablet Take 1 tablet (4 mg total) by mouth every 8 (  eight) hours as needed for nausea or vomiting. 20 tablet 0  . pantoprazole (PROTONIX) 40 MG tablet Take 1 tablet (40 mg total) by mouth daily. Take 30 minutes prior to a meal 90 tablet 3  . predniSONE (DELTASONE) 10 MG tablet Take 50 mg by mouth daily with breakfast.      No current facility-administered medications on file prior to visit.     Past Medical History:  Diagnosis Date  . Anemia   . Depression   . Diabetes mellitus without complication (HCC)    gestational  . Elevated blood-pressure reading without diagnosis of hypertension   . Endometriosis     . Fibroids   . GERD (gastroesophageal reflux disease)   . Graves disease   . Grief at loss of child   . Hypertension   . Hypothyroidism   . Menorrhagia   . Premature ventricular contractions    a. rare PVC by monitor 12/2016.  [name] Thyroid disease   . Uterine leiomyoma     Past Surgical History:  Procedure Laterality Date  . APPENDECTOMY    . ARTERY BIOPSY Left 10/25/2017   Procedure: BIOPSY TEMPORAL ARTERY;  Surgeon: Aviva Signs, MD;  Location: [facility];  Service: General;  Laterality: Left;  . CESAREAN SECTION    . CHOLECYSTECTOMY    . HERNIA REPAIR     incisional  . TUMOR REMOVAL  fibroids    Social History   Socioeconomic History  . Marital status: Married    Spouse name: Not on file  . Number of children: Not on file  . Years of education: Not on file  . Highest education level: Not on file  Occupational History  . Not on file  Social Needs  . Financial resource strain: Not on file  . Food insecurity:    Worry: Not on file    Inability: Not on file  . Transportation needs:    Medical: Not on file    Non-medical: Not on file  Tobacco Use  . Smoking status: Never Smoker  . Smokeless tobacco: Never Used  Substance and Sexual Activity  . Alcohol use: No  . Drug use: No  . Sexual activity: Yes    Birth control/protection: IUD  Lifestyle  . Physical activity:    Days per week: Not on file    Minutes per session: Not on file  . Stress: Not on file  Relationships  . Social connections:    Talks on phone: Not on file    Gets together: Not on file    Attends religious service: Not on file    Active member of club or organization: Not on file    Attends meetings of clubs or organizations: Not on file    Relationship status: Not on file  Other Topics Concern  . Not on file  Social History Narrative  . Not on file    Family History  Problem Relation Age of Onset  . Hypertension Mother   . Cancer Mother   . Hypertension Father   . Diabetes Father   .  Cancer Father     Review of Systems     Objective:  There were no vitals filed for this visit. BP Readings from Last 3 Encounters:  01/20/18 (!) 145/107  12/13/17 (!) 146/96  12/04/17 132/80   Wt Readings from Last 3 Encounters:  01/20/18 (!) 336 lb 11.2 oz (152.7 kg)  12/13/17 (!) 346 lb (156.9 kg)  12/04/17 (!) 351 lb 9.6 oz (159.5 kg)  There is no height or weight on file to calculate BMI.   Physical Exam      ECHOCARDIOGRAM COMPLETE                            *[facility].                        [place], [place] [address]                            [phone]  ------------------------------------------------------------------- Transthoracic Echocardiography  Patient:    Hermine, Feria MR #:       [id number] Study Date: 01/16/2018 Gender:     F Age:        68 Height:     165.1 cm Weight:     144.1 kg BSA:        2.66 m^2 Pt. Status: Room:       1240   ATTENDING    Celedonio Miyamoto, Ejiroghene E  ORDERING     Emokpae, Ejiroghene E  REFERRING    Emokpae, Ejiroghene E  PERFORMING   [facility], Inpatient  SONOGRAPHER  Dance, Tiffany  cc:  ------------------------------------------------------------------- LV EF: 55% -   60%  ------------------------------------------------------------------- Indications:      Dyspnea 786.09.  ------------------------------------------------------------------- History:   PMH:  Leiomyoma. Graves&' Disease. Hypothyroidism.  Risk factors:  Hypertension. Diabetes mellitus.  ------------------------------------------------------------------- Study Conclusions  - Left ventricle: The cavity size was normal. There was mild   concentric hypertrophy. Systolic function was normal. The   estimated ejection fraction was in the range of 55% to 60%. Wall   motion was normal; there were no regional wall motion    abnormalities. Doppler parameters are consistent with abnormal   left ventricular relaxation (grade 1 diastolic dysfunction). - Aortic valve: Transvalvular velocity was within the normal range.   There was no stenosis. There was no regurgitation. - Mitral valve: Transvalvular velocity was within the normal range.   There was no evidence for stenosis. There was no regurgitation. - Right ventricle: The cavity size was normal. Wall thickness was   normal. Systolic function was normal. - Tricuspid valve: There was no regurgitation. - Pericardium, extracardiac: A small pericardial effusion was   identified. There was evidence for increased RV-LV interaction   demonstrated by respirophasic changes in tricuspid velocities.  Impressions:  - Endomyocardial border definition is poor. Systolic function   appears grossly normal. Repeat echo with Definity contrast if   clinically indicated.  ------------------------------------------------------------------- Study data:  Comparison was made to the study of 12/19/2016.  Study status:  Routine.  Procedure:  The patient reported no pain pre or post test. Transthoracic echocardiography. Image quality was adequate. The study was technically difficult, as a result of poor acoustic windows, poor sound wave transmission, and body habitus. Study completion:  There were no complications. Transthoracic echocardiography.  M-mode, complete 2D, spectral Doppler, and color Doppler.  Birthdate:  Patient birthdate: Sep 05, 1965.  Age:  Patient is 52 yr old.  Sex:  Gender: female. BMI: 52.9 kg/m^2.  Blood pressure:  158/117  Patient status: Inpatient.  Study date:  Study date: 01/16/2018. Study time: 02:28 PM.  Location:  ICU/CCU  -------------------------------------------------------------------  ------------------------------------------------------------------- Left ventricle:  The cavity size was normal. There was mild concentric hypertrophy. Systolic  function was normal. The estimated ejection fraction was in the range of 55% to 60%. Wall motion was normal; there were no regional wall motion abnormalities. Doppler parameters are consistent with abnormal left ventricular relaxation (grade 1 diastolic dysfunction).  ------------------------------------------------------------------- Aortic valve:   Trileaflet; normal thickness leaflets. Mobility was not restricted.  Doppler:  Transvalvular velocity was within the normal range. There was no stenosis. There was no regurgitation.   ------------------------------------------------------------------- Aorta:  Aortic root: The aortic root was normal in size.  ------------------------------------------------------------------- Mitral valve:   Structurally normal valve.   Mobility was not restricted.  Doppler:  Transvalvular velocity was within the normal range. There was no evidence for stenosis. There was no regurgitation.  ------------------------------------------------------------------- Left atrium:  The atrium was normal in size.  ------------------------------------------------------------------- Right ventricle:  The cavity size was normal. Wall thickness was normal. Systolic function was normal.  ------------------------------------------------------------------- Pulmonic valve:    Structurally normal valve.   Cusp separation was normal.  Doppler:  Transvalvular velocity was within the normal range. There was no evidence for stenosis. There was no regurgitation.  ------------------------------------------------------------------- Tricuspid valve:   Structurally normal valve.    Doppler: Transvalvular velocity was within the normal range. There was no regurgitation.  ------------------------------------------------------------------- Pulmonary artery:   The main pulmonary artery was normal-sized. Systolic pressure could not be accurately  estimated.  ------------------------------------------------------------------- Right atrium:  The atrium was normal in size.  ------------------------------------------------------------------- Pericardium:  A small pericardial effusion was identified. Doppler:  There was evidence for increased RV-LV interaction demonstrated by respirophasic changes in tricuspid velocities. However there was no right ventriular or right atrial diastolic collapse and the IVC was not dilated. Findings are not consistent with tamponade.  ------------------------------------------------------------------- Systemic veins: Inferior vena cava: Not well visualized.  ------------------------------------------------------------------- Measurements   Left ventricle                         Value        Reference  LV ID, ED, PLAX chordal        (L)     35    mm     43 - 52  LV ID, ES, PLAX chordal                25.2  mm     23 - 38  LV fx shortening, PLAX chordal (L)     28    %      >=29  LV PW thickness, ED                    13.5  mm     ---------  IVS/LV PW ratio, ED                    0.97         <=1.3    Ventricular septum                     Value        Reference  IVS thickness, ED                      13.1  mm     ---------    LVOT  Value        Reference  LVOT ID, S                             23    mm     ---------  LVOT area                              4.15  cm^2   ---------    Aorta                                  Value        Reference  Aortic root ID, ED                     30    mm     ---------  Ascending aorta ID, A-P, S             33    mm     ---------    Left atrium                            Value        Reference  LA ID, A-P, ES                         38    mm     ---------  LA ID/bsa, A-P                         1.43  cm/m^2 <=2.2    Mitral valve                           Value        Reference  Mitral E-wave peak velocity            52.4   cm/s   ---------  Mitral A-wave peak velocity            54.8  cm/s   ---------  Mitral deceleration time               229   ms     150 - 230  Mitral E/A ratio, peak                 1            ---------  Legend: (L)  and  (H)  mark values outside specified reference range.  ------------------------------------------------------------------- Prepared and Electronically Authenticated by  Skeet Latch, MD 2019-07-25T16:19:49 US Abdomen Limited RUQ CLINICAL DATA:  Transaminitis  EXAM: ULTRASOUND ABDOMEN LIMITED RIGHT UPPER QUADRANT  COMPARISON:  None.  FINDINGS: Gallbladder:  Gallbladder is surgically absent. The gallbladder fossa was imaged and is unremarkable.  Common bile duct:  Diameter: 1.8 mm  Liver:  Increased hepatic echogenicity consistent with steatosis. Portal vein is patent on color Doppler imaging with normal direction of blood flow towards the liver.  IMPRESSION: 1. Status post cholecystectomy. 2. Hepatic steatosis.  Electronically Signed   By: Ulyses Jarred M.D.   On: 01/15/2018 23:59   Lab Results  Component Value Date   WBC 10.3 01/19/2018   HGB 11.3 (L) 01/19/2018   HCT  34.9 (L) 01/19/2018   PLT 158 01/19/2018   GLUCOSE 290 (H) 01/20/2018   CHOL 224 (H) 01/18/2018   TRIG 145 01/18/2018   HDL 65 01/18/2018   LDLCALC 130 (H) 01/18/2018   ALT 60 (H) 01/18/2018   AST 22 01/18/2018   NA 135 01/20/2018   K 3.4 (L) 01/20/2018   CL 102 01/20/2018   CREATININE 1.05 (H) 01/20/2018   BUN 14 01/20/2018   CO2 23 01/20/2018   TSH 0.288 (L) 01/15/2018   HGBA1C 12.7 (H) 01/15/2018     Assessment & Plan:    See Problem List for Assessment and Plan of chronic medical problems.   This encounter was created in error - please disregard.

## 2018-01-24 ENCOUNTER — Ambulatory Visit (INDEPENDENT_AMBULATORY_CARE_PROVIDER_SITE_OTHER): Payer: BLUE CROSS/BLUE SHIELD | Admitting: Family Medicine

## 2018-01-24 ENCOUNTER — Encounter: Payer: BLUE CROSS/BLUE SHIELD | Admitting: Internal Medicine

## 2018-01-24 ENCOUNTER — Encounter: Payer: Self-pay | Admitting: Family Medicine

## 2018-01-24 VITALS — BP 126/84 | HR 86 | Ht 65.0 in | Wt 333.0 lb

## 2018-01-24 DIAGNOSIS — E119 Type 2 diabetes mellitus without complications: Secondary | ICD-10-CM | POA: Diagnosis not present

## 2018-01-24 DIAGNOSIS — I10 Essential (primary) hypertension: Secondary | ICD-10-CM | POA: Diagnosis not present

## 2018-01-24 DIAGNOSIS — M316 Other giant cell arteritis: Secondary | ICD-10-CM

## 2018-01-24 DIAGNOSIS — B370 Candidal stomatitis: Secondary | ICD-10-CM | POA: Diagnosis not present

## 2018-01-24 DIAGNOSIS — E890 Postprocedural hypothyroidism: Secondary | ICD-10-CM

## 2018-01-24 LAB — GLUCOSE, POCT (MANUAL RESULT ENTRY): POC Glucose: 462 mg/dl — AB (ref 70–99)

## 2018-01-24 NOTE — Progress Notes (Signed)
Erin [name] - 52 y.o. female MRN [id number]  Date of birth: March 23, 1966  SUBJECTIVE:  Including CC & ROS.  Chief Complaint  Patient presents with  . Hospitalization Follow-up    Erin [name] is a 52 y.o. female that is here today for a hospital follow up.   Admitted 01/15/18 - 01/20/18 for hyperosmolar non-ketotic state of diabetes, DKA.  She went to the ED with SOB of several months that was worse over the past 3 weeks when her prednisone taper started.  The SOB was worse with exertion.  She had mild intermittent chest pain and palpitations x several months.  She denied edema, cough.  She was tachycardic at 116-124, RR 18, O2 sat 92%, Glucose 732, anion gap 20, Cr 1.3, BUN 25, elevated LFTs, WBC 11.6, UA glucose, ketones, hgb, CK 61  Hyperosmolar nonketotic state in DM: a1c 12.7 Initially started on lantus 20 units daily, SS insulin lantus adjusted and also given 9 units regular insulin for meals, which was increased to 12 units lantus was increased to 34 units daily  Oral thrush, vaginal candidiasis: Secondary to oral steroids Oral nystatin S & S started, diflucan and clotrimazole vaginal cream There was some improvement  Hypertension: Losartan was discontinued due to low BP Metoprolol started earlier and then continued  Temporal arteritis: On prednisone taper per rheumatology  Hypothyroidism due to Graves dis: Management per endocrine - no adjustments made during the hospitalization   Review of Systems  Constitutional: Positive for activity change. Negative for fever.  HENT: Negative for congestion.   Respiratory: Negative for cough.   Cardiovascular: Negative for chest pain.  Gastrointestinal: Negative for abdominal pain.  Musculoskeletal: Negative for gait problem.  Skin: Negative for color change.  Neurological: Negative for weakness.  Hematological: Negative for adenopathy.    HISTORY: Past  Medical, Surgical, Social, and Family History Reviewed &  Updated per EMR.   Pertinent Historical Findings include:  Past Medical History:  Diagnosis Date  . Anemia   . Depression   . Diabetes mellitus without complication (HCC)    gestational  . Elevated blood-pressure reading without diagnosis of hypertension   . Endometriosis   . Fibroids   . GERD (gastroesophageal reflux disease)   . Graves disease   . Grief at loss of child   . Hypertension   . Hypothyroidism   . Menorrhagia   . Premature ventricular contractions    a. rare PVC by monitor 12/2016.  [name] Thyroid disease   . Uterine leiomyoma     Past Surgical History:  Procedure Laterality Date  . APPENDECTOMY    . ARTERY BIOPSY Left 10/25/2017   Procedure: BIOPSY TEMPORAL ARTERY;  Surgeon: Aviva Signs, MD;  Location: [facility];  Service: General;  Laterality: Left;  . CESAREAN SECTION    . CHOLECYSTECTOMY    . HERNIA REPAIR     incisional  . TUMOR REMOVAL  fibroids    Allergies  Allergen Reactions  . Prozac [Fluoxetine Hcl] Nausea Only  . Hydrocodone Hives, Itching and Rash    On thighs     Family History  Problem Relation Age of Onset  . Hypertension Mother   . Cancer Mother   . Hypertension Father   . Diabetes Father   . Cancer Father      Social History   Socioeconomic History  . Marital status: Married    Spouse name: Not on file  . Number of children: Not on file  . Years of education: Not on  file  . Highest education level: Not on file  Occupational History  . Not on file  Social Needs  . Financial resource strain: Not on file  . Food insecurity:    Worry: Not on file    Inability: Not on file  . Transportation needs:    Medical: Not on file    Non-medical: Not on file  Tobacco Use  . Smoking status: Never Smoker  . Smokeless tobacco: Never Used  Substance and Sexual Activity  . Alcohol use: No  . Drug use: No  . Sexual activity: Yes    Birth control/protection: IUD  Lifestyle  . Physical activity:    Days per week: Not on file     Minutes per session: Not on file  . Stress: Not on file  Relationships  . Social connections:    Talks on phone: Not on file    Gets together: Not on file    Attends religious service: Not on file    Active member of club or organization: Not on file    Attends meetings of clubs or organizations: Not on file    Relationship status: Not on file  . Intimate partner violence:    Fear of current or ex partner: Not on file    Emotionally abused: Not on file    Physically abused: Not on file    Forced sexual activity: Not on file  Other Topics Concern  . Not on file  Social History Narrative  . Not on file     PHYSICAL EXAM:  VS: BP 126/84 (BP Location: Left Arm, Patient Position: Sitting, Cuff Size: Normal)   Pulse 86   Ht 5\' 5"  (1.651 m)   Wt (!) 333 lb (151 kg)   SpO2 96%   BMI 55.41 kg/m  Physical Exam Gen: NAD, alert, cooperative with exam, well-appearing ENT: normal lips, normal nasal mucosa,  Eye: normal EOM, normal conjunctiva and lids CV:  no edema, +2 pedal pulses   Resp: no accessory muscle use, non-labored,  Skin: no rashes, no areas of induration  Neuro: normal tone, normal sensation to touch Psych:  normal insight, alert and oriented MSK: normal gait, normal strength      ASSESSMENT & PLAN:   Temporal arteritis (HCC) Has been getting tapered off prednisone. Reports she has the clinical symptoms of TA but lab work doesn't suggest that she does does.  - Rheum to manage.   Oral thrush Has improved. Occurs every time she has the prednisone adjusted.  - diflucan   Diabetes mellitus type 2, uncomplicated (HCC) Uncontrolled. Is adjusting to taking medications.  - will increase insulin 1 U per day to try to achieve morning blood sugar of 130. Advised that she may need to go down on her insulin as she continues to taper off prednisone  - continue current novolog dose. Advised she may need to take an extra 10 U of novolog outside of the three times per day if  her blood sugar is greater than 250 - counseled on diet  - advised to follow up in 1 week  Hypertension Currently controlled  - continue current medications.   Hypothyroidism following radioiodine therapy Her follow up with endocrine has been pushed back.  - will continue to monitor

## 2018-01-24 NOTE — Patient Instructions (Addendum)
Nice to meet you  Please increase your insulin one unit per day until your morning blood sugar is close to 130. You may need to decrease this as you taper off of the prednisone  You can give yourself an extra dose of 10 units of novolog if your blood sugar is elevated greater than 250 outside of the regularly schedule novolog doses.  Please follow up with me or Dr. Quay Burow in 1 week.

## 2018-01-26 NOTE — Assessment & Plan Note (Signed)
Her follow up with endocrine has been pushed back.  - will continue to monitor

## 2018-01-26 NOTE — Assessment & Plan Note (Signed)
Uncontrolled. Is adjusting to taking medications.  - will increase insulin 1 U per day to try to achieve morning blood sugar of 130. Advised that she may need to go down on her insulin as she continues to taper off prednisone  - continue current novolog dose. Advised she may need to take an extra 10 U of novolog outside of the three times per day if her blood sugar is greater than 250 - counseled on diet  - advised to follow up in 1 week

## 2018-01-26 NOTE — Assessment & Plan Note (Signed)
Currently controlled  - continue current medications.

## 2018-01-26 NOTE — Assessment & Plan Note (Signed)
Has been getting tapered off prednisone. Reports she has the clinical symptoms of TA but lab work doesn't suggest that she does does.  - Rheum to manage.

## 2018-01-26 NOTE — Assessment & Plan Note (Signed)
Has improved. Occurs every time she has the prednisone adjusted.  - diflucan

## 2018-01-27 ENCOUNTER — Other Ambulatory Visit: Payer: Self-pay | Admitting: Internal Medicine

## 2018-01-27 NOTE — Telephone Encounter (Signed)
Called and left a message for patient to call back to schedule a new patient doctor referral appointment with our office to see any provider for an AEX.

## 2018-01-28 NOTE — Progress Notes (Signed)
Subjective:    Patient ID: Erin [name], female    DOB: May 30, 1966, 52 y.o.   MRN: [id number]  HPI The patient is here for follow up from last week.  Admitted 01/15/18 - 01/20/18 for hyperosmolar non-ketotic state of diabetes, DKA.  Diabetes:  She is taking lantus once a day 40 units.  She just increased the dose today.  She is taking novolog 12 units with each meal and 10 units at bedtime if her sugar is very high.  She feels she is doing well with following a low sugar/carb diet.  She is not exercising right now - has no energy.    Temporal arteritis:  On prednisone taper which is being managed by rheumatology.  She will start tapering the steroids tomorrow - down to 40 mg for one week and then down to 30 mg.  She sees the rheumatologist later this month.   Oral thrush, vaginal candidiasis:  She has taken a lot of medication for this and still feels like she has it down her throat and in her mouth.  She is still on the steroids.    GERD:  She is taking her medication daily as prescribed.  She denies any GERD symptoms and feels her GERD is well controlled.   Neuropathy:  She has neuropathy in her feet, and both hands - R hand > left.    She also feels it in her right hip.  She knows once her sugars get better this will improve.  Hypertension, palpitations:  losartan was discontinued due to low BP, but  Metoprolol started for palpitations.  She does not like the metoprolol and does not feel it is working as well.  She has always had palpitations and they never bothered her.  She would like to go back on the Losartan.       Medications and allergies reviewed with patient and updated if appropriate.  Patient Active Problem List   Diagnosis Date Noted  . Hyperlipidemia 01/23/2018  . Elevated liver enzymes   . Oral thrush 01/16/2018  . Vaginal candidiasis 01/16/2018  . Diabetes mellitus type 2, uncomplicated ([place]) 34/19/6222  . Hyperosmolar non-ketotic state in patient with type 2  diabetes mellitus ([place]) 01/16/2018  . DKA (diabetic ketoacidoses) ([place]) 01/15/2018  . Left facial pressure and pain 11/09/2017  . Thrush 11/09/2017  . Gastroesophageal reflux disease 10/05/2017  . Temporal arteritis ([place]) 10/03/2017  . Right foot pain 07/15/2017  . Grief at loss of child 11/02/2016  . H/O gestational diabetes mellitus, not currently pregnant 09/28/2015  . Hypertension 09/28/2015  . Morbid obesity ([place]) 05/17/2015  . Umbilical hernia 97/98/9211  . Chronic lower back pain 05/17/2015  . Snoring 05/17/2015  . Palpitations 05/17/2015  . ANEMIA 09/03/2007  . Hypothyroidism following radioiodine therapy 09/02/2007  . INTERSTITIAL CYSTITIS 09/01/2007  . ALLERGIC RHINITIS 08/07/2007  . [place] DISEASE, LUMBAR 08/07/2007    Current Outpatient Medications on File Prior to Visit  Medication Sig Dispense Refill  . antiseptic oral rinse (BIOTENE) LIQD 15 mLs by Mouth Rinse route as needed for dry mouth or mouth pain.    [name] atorvastatin (LIPITOR) 40 MG tablet Take 1 tablet (40 mg total) by mouth daily at 6 PM. 30 tablet 0  . blood glucose meter kit and supplies KIT Dispense based on patient and insurance preference. Use up to twice daily. 1 each 0  . cholecalciferol (VITAMIN D) 1000 units tablet Take 1,000 Units by mouth daily.    . clotrimazole (GYNE-LOTRIMIN)  1 % vaginal cream Place 1 Applicatorful vaginally at bedtime. 45 g 0  . CONTOUR TEST test strip TEST TWICE DAILY 100 each 1  . ibuprofen (ADVIL,MOTRIN) 400 MG tablet Take 1 tablet (400 mg total) by mouth every 6 (six) hours as needed for fever, headache or mild pain (use first). 30 tablet 0  . insulin aspart (NOVOLOG FLEXPEN) 100 UNIT/ML FlexPen Inject 12 Units into the skin 3 (three) times daily with meals. 15 mL 0  . Insulin Glargine (LANTUS SOLOSTAR) 100 UNIT/ML Solostar Pen Inject 34 Units into the skin daily. 15 mL 0  . Insulin Pen Needle (PEN NEEDLES 3/16") 31G X 5 MM MISC 12 Units by Does not apply route 3 (three) times  daily before meals. 100 each 0  . levonorgestrel (MIRENA, 52 MG,) 20 MCG/24HR IUD Mirena 20 mcg/24 hr (5 years) intrauterine device  Take 1 device by intrauterine route.    [name] levothyroxine (SYNTHROID, LEVOTHROID) 125 MCG tablet Take 1 tablet (125 mcg total) by mouth daily before breakfast. 30 tablet 1  . ondansetron (ZOFRAN ODT) 4 MG disintegrating tablet Take 1 tablet (4 mg total) by mouth every 8 (eight) hours as needed for nausea or vomiting. 20 tablet 0  . pantoprazole (PROTONIX) 40 MG tablet Take 1 tablet (40 mg total) by mouth daily. Take 30 minutes prior to a meal 90 tablet 3  . predniSONE (DELTASONE) 10 MG tablet Take 50 mg by mouth daily with breakfast.      No current facility-administered medications on file prior to visit.     Past Medical History:  Diagnosis Date  . Anemia   . Depression   . Diabetes mellitus without complication (HCC)    gestational  . Elevated blood-pressure reading without diagnosis of hypertension   . Endometriosis   . Fibroids   . GERD (gastroesophageal reflux disease)   . Graves disease   . Grief at loss of child   . Hypertension   . Hypothyroidism   . Menorrhagia   . Premature ventricular contractions    a. rare PVC by monitor 12/2016.  [name] Thyroid disease   . Uterine leiomyoma     Past Surgical History:  Procedure Laterality Date  . APPENDECTOMY    . ARTERY BIOPSY Left 10/25/2017   Procedure: BIOPSY TEMPORAL ARTERY;  Surgeon: Aviva Signs, MD;  Location: [facility];  Service: General;  Laterality: Left;  . CESAREAN SECTION    . CHOLECYSTECTOMY    . HERNIA REPAIR     incisional  . TUMOR REMOVAL  fibroids    Social History   Socioeconomic History  . Marital status: Married    Spouse name: Not on file  . Number of children: Not on file  . Years of education: Not on file  . Highest education level: Not on file  Occupational History  . Not on file  Social Needs  . Financial resource strain: Not on file  . Food insecurity:    Worry: Not  on file    Inability: Not on file  . Transportation needs:    Medical: Not on file    Non-medical: Not on file  Tobacco Use  . Smoking status: Never Smoker  . Smokeless tobacco: Never Used  Substance and Sexual Activity  . Alcohol use: No  . Drug use: No  . Sexual activity: Yes    Birth control/protection: IUD  Lifestyle  . Physical activity:    Days per week: Not on file    Minutes per session: Not  on file  . Stress: Not on file  Relationships  . Social connections:    Talks on phone: Not on file    Gets together: Not on file    Attends religious service: Not on file    Active member of club or organization: Not on file    Attends meetings of clubs or organizations: Not on file    Relationship status: Not on file  Other Topics Concern  . Not on file  Social History Narrative  . Not on file    Family History  Problem Relation Age of Onset  . Hypertension Mother   . Cancer Mother   . Hypertension Father   . Diabetes Father   . Cancer Father     Review of Systems  Constitutional: Negative for appetite change, chills and fever.  Respiratory: Negative for cough, shortness of breath and wheezing.   Cardiovascular: Positive for palpitations. Negative for chest pain and leg swelling.  Endocrine: Positive for polyuria.  Neurological: Positive for headaches. Negative for light-headedness.       Objective:   Vitals:   01/29/18 1302  BP: (!) 132/94  Pulse: (!) 119  Resp: 16  Temp: 97.9 F (36.6 C)  SpO2: 97%   BP Readings from Last 3 Encounters:  01/29/18 (!) 132/94  01/24/18 126/84  01/20/18 (!) 145/107   Wt Readings from Last 3 Encounters:  01/29/18 (!) 334 lb (151.5 kg)  01/24/18 (!) 333 lb (151 kg)  01/20/18 (!) 336 lb 11.2 oz (152.7 kg)   Body mass index is 55.58 kg/m.   Physical Exam    Constitutional: Appears well-developed and well-nourished. No distress.  HENT:  Head: Normocephalic and atraumatic.  Neck: Neck supple. No tracheal deviation  present. No thyromegaly present.  No cervical lymphadenopathy. Mild tongue thrush Cardiovascular: Normal rate, regular rhythm and normal heart sounds.   No murmur heard. No carotid bruit .  No edema Pulmonary/Chest: Effort normal and breath sounds normal. No respiratory distress. No has no wheezes. No rales.  Skin: Skin is warm and dry. Not diaphoretic.  Psychiatric: Normal mood and affect. Behavior is normal.   ECHOCARDIOGRAM COMPLETE                            *[facility] [employer].                        [place], [place] [address]                            [phone]  ------------------------------------------------------------------- Transthoracic Echocardiography  Patient:    Darnise, Montag MR #:       [id number] Study Date: 01/16/2018 Gender:     F Age:        89 Height:     165.1 cm Weight:     144.1 kg BSA:        2.66 m^2 Pt. Status: Room:       Kief, Florence     Emokpae, Ejiroghene E  REFERRING    Emokpae, Ejiroghene E  PERFORMING   [facility], Inpatient  SONOGRAPHER  Dance, Tiffany  cc:  ------------------------------------------------------------------- LV EF: 55% -   60%  ------------------------------------------------------------------- Indications:      Dyspnea 786.09.  ------------------------------------------------------------------- History:   PMH:  Leiomyoma. Graves&' Disease. Hypothyroidism.  Risk factors:  Hypertension. Diabetes mellitus.  ------------------------------------------------------------------- Study Conclusions  - Left ventricle: The cavity size was normal. There was mild   concentric hypertrophy. Systolic function was normal. The   estimated ejection fraction was in the range of 55% to 60%. Wall   motion was normal; there were no regional wall motion   abnormalities. Doppler  parameters are consistent with abnormal   left ventricular relaxation (grade 1 diastolic dysfunction). - Aortic valve: Transvalvular velocity was within the normal range.   There was no stenosis. There was no regurgitation. - Mitral valve: Transvalvular velocity was within the normal range.   There was no evidence for stenosis. There was no regurgitation. - Right ventricle: The cavity size was normal. Wall thickness was   normal. Systolic function was normal. - Tricuspid valve: There was no regurgitation. - Pericardium, extracardiac: A small pericardial effusion was   identified. There was evidence for increased RV-LV interaction   demonstrated by respirophasic changes in tricuspid velocities.  Impressions:  - Endomyocardial border definition is poor. Systolic function   appears grossly normal. Repeat echo with Definity contrast if   clinically indicated.  ------------------------------------------------------------------- Study data:  Comparison was made to the study of 12/19/2016.  Study status:  Routine.  Procedure:  The patient reported no pain pre or post test. Transthoracic echocardiography. Image quality was adequate. The study was technically difficult, as a result of poor acoustic windows, poor sound wave transmission, and body habitus. Study completion:  There were no complications. Transthoracic echocardiography.  M-mode, complete 2D, spectral Doppler, and color Doppler.  Birthdate:  Patient birthdate: September 08, 1965.  Age:  Patient is 52 yr old.  Sex:  Gender: female. BMI: 52.9 kg/m^2.  Blood pressure:     158/117  Patient status: Inpatient.  Study date:  Study date: 01/16/2018. Study time: 02:28 PM.  Location:  ICU/CCU  -------------------------------------------------------------------  ------------------------------------------------------------------- Left ventricle:  The cavity size was normal. There was mild concentric hypertrophy. Systolic function was normal. The  estimated ejection fraction was in the range of 55% to 60%. Wall motion was normal; there were no regional wall motion abnormalities. Doppler parameters are consistent with abnormal left ventricular relaxation (grade 1 diastolic dysfunction).  ------------------------------------------------------------------- Aortic valve:   Trileaflet; normal thickness leaflets. Mobility was not restricted.  Doppler:  Transvalvular velocity was within the normal range. There was no stenosis. There was no regurgitation.   ------------------------------------------------------------------- Aorta:  Aortic root: The aortic root was normal in size.  ------------------------------------------------------------------- Mitral valve:   Structurally normal valve.   Mobility was not restricted.  Doppler:  Transvalvular velocity was within the normal range. There was no evidence for stenosis. There was no regurgitation.  ------------------------------------------------------------------- Left atrium:  The atrium was normal in size.  ------------------------------------------------------------------- Right ventricle:  The cavity size was normal. Wall thickness was normal. Systolic function was normal.  ------------------------------------------------------------------- Pulmonic valve:    Structurally normal valve.   Cusp separation was normal.  Doppler:  Transvalvular velocity was within the normal range. There was no evidence for stenosis. There was no regurgitation.  ------------------------------------------------------------------- Tricuspid valve:   Structurally normal valve.    Doppler: Transvalvular velocity was within the normal range. There was no regurgitation.  ------------------------------------------------------------------- Pulmonary artery:  The main pulmonary artery was normal-sized. Systolic pressure could not be accurately  estimated.  ------------------------------------------------------------------- Right atrium:  The atrium was normal in size.  ------------------------------------------------------------------- Pericardium:  A small pericardial effusion was identified. Doppler:  There was evidence for increased RV-LV interaction demonstrated by respirophasic changes in tricuspid velocities. However there was no right ventriular or right atrial diastolic collapse and the IVC was not dilated. Findings are not consistent with tamponade.  ------------------------------------------------------------------- Systemic veins: Inferior vena cava: Not well visualized.  ------------------------------------------------------------------- Measurements   Left ventricle                         Value        Reference  LV ID, ED, PLAX chordal        (L)     35    mm     43 - 52  LV ID, ES, PLAX chordal                25.2  mm     23 - 38  LV fx shortening, PLAX chordal (L)     28    %      >=29  LV PW thickness, ED                    13.5  mm     ---------  IVS/LV PW ratio, ED                    0.97         <=1.3    Ventricular septum                     Value        Reference  IVS thickness, ED                      13.1  mm     ---------    LVOT                                   Value        Reference  LVOT ID, S                             23    mm     ---------  LVOT area                              4.15  cm^2   ---------    Aorta                                  Value        Reference  Aortic root ID, ED                     30    mm     ---------  Ascending aorta ID, A-P, S             33    mm     ---------    Left atrium  Value        Reference  LA ID, A-P, ES                         38    mm     ---------  LA ID/bsa, A-P                         1.43  cm/m^2 <=2.2    Mitral valve                           Value        Reference  Mitral E-wave peak velocity            52.4   cm/s   ---------  Mitral A-wave peak velocity            54.8  cm/s   ---------  Mitral deceleration time               229   ms     150 - 230  Mitral E/A ratio, peak                 1            ---------  Legend: (L)  and  (H)  mark values outside specified reference range.  ------------------------------------------------------------------- Prepared and Electronically Authenticated by  Skeet Latch, MD 2019-07-25T16:19:49 US Abdomen Limited RUQ CLINICAL DATA:  Transaminitis  EXAM: ULTRASOUND ABDOMEN LIMITED RIGHT UPPER QUADRANT  COMPARISON:  None.  FINDINGS: Gallbladder:  Gallbladder is surgically absent. The gallbladder fossa was imaged and is unremarkable.  Common bile duct:  Diameter: 1.8 mm  Liver:  Increased hepatic echogenicity consistent with steatosis. Portal vein is patent on color Doppler imaging with normal direction of blood flow towards the liver.  IMPRESSION: 1. Status post cholecystectomy. 2. Hepatic steatosis.  Electronically Signed   By: Ulyses Jarred M.D.   On: 01/15/2018 23:59   Lab Results  Component Value Date   WBC 10.3 01/19/2018   HGB 11.3 (L) 01/19/2018   HCT 34.9 (L) 01/19/2018   PLT 158 01/19/2018   GLUCOSE 290 (H) 01/20/2018   CHOL 224 (H) 01/18/2018   TRIG 145 01/18/2018   HDL 65 01/18/2018   LDLCALC 130 (H) 01/18/2018   ALT 60 (H) 01/18/2018   AST 22 01/18/2018   NA 135 01/20/2018   K 3.4 (L) 01/20/2018   CL 102 01/20/2018   CREATININE 1.05 (H) 01/20/2018   BUN 14 01/20/2018   CO2 23 01/20/2018   TSH 0.288 (L) 01/15/2018   HGBA1C 12.7 (H) 01/15/2018     Assessment & Plan:    See Problem List for Assessment and Plan of chronic medical problems.

## 2018-01-28 NOTE — Telephone Encounter (Signed)
Called and spoke with patient in attempt to schedule a new patient appointment. Patient stated she is just getting out of the hospital and would like to defer the referral for one month. Referral deferred to 02/28/18.

## 2018-01-29 ENCOUNTER — Other Ambulatory Visit (INDEPENDENT_AMBULATORY_CARE_PROVIDER_SITE_OTHER): Payer: BLUE CROSS/BLUE SHIELD

## 2018-01-29 ENCOUNTER — Ambulatory Visit: Payer: BLUE CROSS/BLUE SHIELD | Admitting: Internal Medicine

## 2018-01-29 ENCOUNTER — Encounter: Payer: Self-pay | Admitting: Internal Medicine

## 2018-01-29 VITALS — BP 132/94 | HR 119 | Temp 97.9°F | Resp 16 | Wt 334.0 lb

## 2018-01-29 DIAGNOSIS — R002 Palpitations: Secondary | ICD-10-CM

## 2018-01-29 DIAGNOSIS — E890 Postprocedural hypothyroidism: Secondary | ICD-10-CM | POA: Diagnosis not present

## 2018-01-29 DIAGNOSIS — B370 Candidal stomatitis: Secondary | ICD-10-CM

## 2018-01-29 DIAGNOSIS — I10 Essential (primary) hypertension: Secondary | ICD-10-CM

## 2018-01-29 DIAGNOSIS — E119 Type 2 diabetes mellitus without complications: Secondary | ICD-10-CM

## 2018-01-29 DIAGNOSIS — K219 Gastro-esophageal reflux disease without esophagitis: Secondary | ICD-10-CM

## 2018-01-29 DIAGNOSIS — M316 Other giant cell arteritis: Secondary | ICD-10-CM

## 2018-01-29 LAB — COMPREHENSIVE METABOLIC PANEL
ALT: 54 U/L — ABNORMAL HIGH (ref 0–35)
AST: 24 U/L (ref 0–37)
Albumin: 3.7 g/dL (ref 3.5–5.2)
Alkaline Phosphatase: 229 U/L — ABNORMAL HIGH (ref 39–117)
BUN: 23 mg/dL (ref 6–23)
CO2: 32 mEq/L (ref 19–32)
Calcium: 9.7 mg/dL (ref 8.4–10.5)
Chloride: 99 mEq/L (ref 96–112)
Creatinine, Ser: 0.98 mg/dL (ref 0.40–1.20)
GFR: 76.79 mL/min (ref >60.00)
Glucose, Bld: 329 mg/dL — ABNORMAL HIGH (ref 70–99)
Potassium: 4 mEq/L (ref 3.5–5.1)
Sodium: 140 mEq/L (ref 135–145)
Total Bilirubin: 0.6 mg/dL (ref 0.2–1.2)
Total Protein: 6.4 g/dL (ref 6.0–8.3)

## 2018-01-29 LAB — TSH: TSH: 1.06 u[IU]/mL (ref 0.35–4.50)

## 2018-01-29 MED ORDER — LOSARTAN POTASSIUM 100 MG PO TABS: 100.0000 mg | ORAL_TABLET | Freq: Every day | ORAL | 3 refills | Status: DC

## 2018-01-29 MED ORDER — FLUCONAZOLE 200 MG PO TABS: ORAL_TABLET | ORAL | 0 refills | Status: DC

## 2018-01-29 MED ORDER — ALPRAZOLAM 0.5 MG PO TABS: 0.5000 mg | ORAL_TABLET | Freq: Two times a day (BID) | ORAL | 0 refills | Status: DC | PRN

## 2018-01-29 NOTE — Assessment & Plan Note (Signed)
GERD controlled Continue daily medication  

## 2018-01-29 NOTE — Assessment & Plan Note (Signed)
Not completely treated Start diflucan 200 mg daily for 3 weeks then weekly for prevention

## 2018-01-29 NOTE — Assessment & Plan Note (Signed)
Sugars still high Taking lantus 40 mg units novolog 12 units with meals and 10 units at bedtime if sugars elevated Will continue to adjust dose as needed Prednisone dose being decreased tomorrow and again after one week - stressed very close monitoring of sugars so insulin can be adjusted Will start exercising when able cnotinue strict diabetic diet

## 2018-01-29 NOTE — Assessment & Plan Note (Signed)
She is concerned about her thyroid Will check tsh and forward to endocrine if adjustment of medication needed

## 2018-01-29 NOTE — Assessment & Plan Note (Signed)
Not ideally controlled and wants to go back on losartan Stop metoprolol - this may be contributing to her fatigue Restart losartan at 100 mg daily cmp

## 2018-01-29 NOTE — Assessment & Plan Note (Signed)
Chronic, intermittent Not bothersome to her Will d/c metoprolol - she would rather not be on this  Will monitor HR

## 2018-01-29 NOTE — Patient Instructions (Addendum)
  Test(s) ordered today. Your results will be released to [facility] (or called to you) after review, usually within 72hours after test completion. If any changes need to be made, you will be notified at that same time.   Medications reviewed and updated.  Changes include  Stop the metoprolol Start losartan 100 mg daily Start fluconazole 200 mg daily for 3 weeks and then 1 pill weekly   Your prescription(s) have been submitted to your pharmacy. Please take as directed and contact our office if you believe you are having problem(s) with the medication(s).    Please followup in 1 month

## 2018-01-29 NOTE — Assessment & Plan Note (Signed)
Starts 40 mg of prednisone 8/8 for one week than then decreased to 30 mg and then follows up with rheumatology

## 2018-01-30 ENCOUNTER — Encounter: Payer: Self-pay | Admitting: Internal Medicine

## 2018-01-30 DIAGNOSIS — Z7952 Long term (current) use of systemic steroids: Secondary | ICD-10-CM | POA: Diagnosis not present

## 2018-01-30 DIAGNOSIS — K219 Gastro-esophageal reflux disease without esophagitis: Secondary | ICD-10-CM | POA: Diagnosis not present

## 2018-01-30 DIAGNOSIS — I10 Essential (primary) hypertension: Secondary | ICD-10-CM | POA: Diagnosis not present

## 2018-01-30 DIAGNOSIS — D649 Anemia, unspecified: Secondary | ICD-10-CM | POA: Diagnosis not present

## 2018-01-30 DIAGNOSIS — E1100 Type 2 diabetes mellitus with hyperosmolarity without nonketotic hyperglycemic-hyperosmolar coma (NKHHC): Secondary | ICD-10-CM | POA: Diagnosis not present

## 2018-01-30 DIAGNOSIS — R002 Palpitations: Secondary | ICD-10-CM | POA: Diagnosis not present

## 2018-01-30 DIAGNOSIS — M316 Other giant cell arteritis: Secondary | ICD-10-CM | POA: Diagnosis not present

## 2018-01-30 DIAGNOSIS — R0789 Other chest pain: Secondary | ICD-10-CM | POA: Diagnosis not present

## 2018-01-30 DIAGNOSIS — F329 Major depressive disorder, single episode, unspecified: Secondary | ICD-10-CM | POA: Diagnosis not present

## 2018-01-30 DIAGNOSIS — Z794 Long term (current) use of insulin: Secondary | ICD-10-CM | POA: Diagnosis not present

## 2018-01-30 DIAGNOSIS — E1110 Type 2 diabetes mellitus with ketoacidosis without coma: Secondary | ICD-10-CM | POA: Diagnosis not present

## 2018-01-30 DIAGNOSIS — E890 Postprocedural hypothyroidism: Secondary | ICD-10-CM | POA: Diagnosis not present

## 2018-01-31 ENCOUNTER — Telehealth: Payer: Self-pay | Admitting: Internal Medicine

## 2018-01-31 NOTE — Telephone Encounter (Signed)
Copied from [place] [phone]. Topic: Quick Communication - See Telephone Encounter >> Jan 31, 2018 11:26 AM Neva Seat wrote: Charlynn Grimes w/ [facility] [phone]  Verbal orders: Nursing visits for diabetic management. 1 visit this week of Aug 4th 1 visit the week of  Aug 11th

## 2018-01-31 NOTE — Telephone Encounter (Signed)
Spoke with pt to inform witth Donita to give verbal orders per MD.

## 2018-02-03 ENCOUNTER — Telehealth: Payer: Self-pay | Admitting: Internal Medicine

## 2018-02-03 NOTE — Telephone Encounter (Signed)
Copied from [place] [phone]. Topic: Quick Communication - Rx Refill/Question >> Feb 03, 2018 10:07 AM Burchel, Abbi R wrote: Medication: predniSONE (DELTASONE) 10 MG tablet  Preferred Pharmacy:[id number] - [place], [place] - [place] [address] [place] [address] Phone: [phone] Fax: [phone]  Pt: [phone]

## 2018-02-03 NOTE — Telephone Encounter (Signed)
Rheumatology is managing and since dose is being decreased they should refill so the dose is accurate

## 2018-02-03 NOTE — Telephone Encounter (Signed)
Spoke with pt to inform.  

## 2018-02-03 NOTE — Telephone Encounter (Signed)
Okay to refill? 

## 2018-02-05 ENCOUNTER — Ambulatory Visit: Payer: BLUE CROSS/BLUE SHIELD | Admitting: Family

## 2018-02-05 ENCOUNTER — Encounter: Payer: Self-pay | Admitting: Family

## 2018-02-05 VITALS — BP 132/88 | HR 113 | Temp 98.7°F | Ht 65.0 in | Wt 334.0 lb

## 2018-02-05 DIAGNOSIS — K0889 Other specified disorders of teeth and supporting structures: Secondary | ICD-10-CM

## 2018-02-05 MED ORDER — AMOXICILLIN-POT CLAVULANATE 875-125 MG PO TABS: 1.0000 | ORAL_TABLET | Freq: Two times a day (BID) | ORAL | 0 refills | Status: DC

## 2018-02-05 NOTE — Progress Notes (Signed)
Erin [name] is a 52 y.o. female with the following history as recorded in EpicCare:  Patient Active Problem List   Diagnosis Date Noted  . Hyperlipidemia 01/23/2018  . Elevated liver enzymes   . Oropharyngeal candidiasis 01/16/2018  . Vaginal candidiasis 01/16/2018  . Diabetes mellitus type 2, uncomplicated ([place]) 38/18/2993  . Hyperosmolar non-ketotic state in patient with type 2 diabetes mellitus ([place]) 01/16/2018  . DKA (diabetic ketoacidoses) ([place]) 01/15/2018  . Left facial pressure and pain 11/09/2017  . Thrush 11/09/2017  . Gastroesophageal reflux disease 10/05/2017  . Temporal arteritis ([place]) 10/03/2017  . Right foot pain 07/15/2017  . Grief at loss of child 11/02/2016  . H/O gestational diabetes mellitus, not currently pregnant 09/28/2015  . Hypertension 09/28/2015  . Morbid obesity ([place]) 05/17/2015  . Umbilical hernia 71/69/6789  . Chronic lower back pain 05/17/2015  . Snoring 05/17/2015  . Palpitations 05/17/2015  . ANEMIA 09/03/2007  . Hypothyroidism following radioiodine therapy 09/02/2007  . INTERSTITIAL CYSTITIS 09/01/2007  . ALLERGIC RHINITIS 08/07/2007  . [place] DISEASE, LUMBAR 08/07/2007    Current Outpatient Medications  Medication Sig Dispense Refill  . ALPRAZolam (XANAX) 0.5 MG tablet Take 1 tablet (0.5 mg total) by mouth 2 (two) times daily as needed for anxiety. 30 tablet 0  . amoxicillin-clavulanate (AUGMENTIN) 875-125 MG tablet Take 1 tablet by mouth 2 (two) times daily. 20 tablet 0  . antiseptic oral rinse (BIOTENE) LIQD 15 mLs by Mouth Rinse route as needed for dry mouth or mouth pain.    . blood glucose meter kit and supplies KIT Dispense based on patient and insurance preference. Use up to twice daily. 1 each 0  . cholecalciferol (VITAMIN D) 1000 units tablet Take 1,000 Units by mouth daily.    . clotrimazole (GYNE-LOTRIMIN) 1 % vaginal cream Place 1 Applicatorful vaginally at bedtime. 45 g 0  . CONTOUR TEST test strip TEST TWICE DAILY 100 each 1  .  fluconazole (DIFLUCAN) 200 MG tablet Take 1 tab daily for 3 weeks then one tab weekly 40 tablet 0  . ibuprofen (ADVIL,MOTRIN) 400 MG tablet Take 1 tablet (400 mg total) by mouth every 6 (six) hours as needed for fever, headache or mild pain (use first). 30 tablet 0  . insulin aspart (NOVOLOG FLEXPEN) 100 UNIT/ML FlexPen Inject 12 Units into the skin 3 (three) times daily with meals. 15 mL 0  . Insulin Glargine (LANTUS SOLOSTAR) 100 UNIT/ML Solostar Pen Inject 34 Units into the skin daily. 15 mL 0  . Insulin Pen Needle (PEN NEEDLES 3/16") 31G X 5 MM MISC 12 Units by Does not apply route 3 (three) times daily before meals. 100 each 0  . levonorgestrel (MIRENA, 52 MG,) 20 MCG/24HR IUD Mirena 20 mcg/24 hr (5 years) intrauterine device  Take 1 device by intrauterine route.    [name] levothyroxine (SYNTHROID, LEVOTHROID) 125 MCG tablet Take 1 tablet (125 mcg total) by mouth daily before breakfast. 30 tablet 1  . losartan (COZAAR) 100 MG tablet Take 1 tablet (100 mg total) by mouth daily. 90 tablet 3  . Olopatadine HCl (PATADAY) 0.2 % SOLN Pataday 0.2 % eye drops  INSTILL 1 DROP INTO AFFECTED EYE(S) BY OPHTHALMIC ROUTE ONCE DAILY PRN    . ondansetron (ZOFRAN ODT) 4 MG disintegrating tablet Take 1 tablet (4 mg total) by mouth every 8 (eight) hours as needed for nausea or vomiting. 20 tablet 0  . pantoprazole (PROTONIX) 40 MG tablet Take 1 tablet (40 mg total) by mouth daily. Take 30 minutes  prior to a meal 90 tablet 3  . predniSONE (DELTASONE) 10 MG tablet Take 50 mg by mouth daily with breakfast.      No current facility-administered medications for this visit.     Allergies: Prozac [fluoxetine hcl] and Hydrocodone  Past Medical History:  Diagnosis Date  . Anemia   . Depression   . Diabetes mellitus without complication (HCC)    gestational  . Elevated blood-pressure reading without diagnosis of hypertension   . Endometriosis   . Fibroids   . GERD (gastroesophageal reflux disease)   . Graves disease    . Grief at loss of child   . Hypertension   . Hypothyroidism   . Menorrhagia   . Premature ventricular contractions    a. rare PVC by monitor 12/2016.  [name] Thyroid disease   . Uterine leiomyoma     Past Surgical History:  Procedure Laterality Date  . APPENDECTOMY    . ARTERY BIOPSY Left 10/25/2017   Procedure: BIOPSY TEMPORAL ARTERY;  Surgeon: Aviva Signs, MD;  Location: [facility];  Service: General;  Laterality: Left;  . CESAREAN SECTION    . CHOLECYSTECTOMY    . HERNIA REPAIR     incisional  . TUMOR REMOVAL  fibroids    Family History  Problem Relation Age of Onset  . Hypertension Mother   . Cancer Mother   . Hypertension Father   . Diabetes Father   . Cancer Father     Social History   Tobacco Use  . Smoking status: Never Smoker  . Smokeless tobacco: Never Used  Substance Use Topics  . Alcohol use: No    Subjective:   Patient presents with left sided mouth pain; had a crown fall out/ tooth break on the left side of her mouth/ lower jaw this past weekend; now having pain and swelling; has not been able to see her dentist; no fever; no difficulty swallowing;    Objective:  Vitals:   02/05/18 1534  BP: 132/88  Pulse: (!) 113  Temp: 98.7 F (37.1 C)  TempSrc: Oral  SpO2: 96%  Weight: (!) 334 lb (151.5 kg)  Height: _0  (1.651 m)    General: Well developed, well nourished, in no acute distress  Skin : Warm and dry.  Head: Normocephalic and atraumatic  Eyes: Sclera and conjunctiva clear; pupils round and reactive to light; extraocular movements intact  Ears: External normal; canals clear; tympanic membranes normal  Oropharynx: Pink, supple. No suspicious lesions; broken tooth noted lower left jaw Neck: Supple without thyromegaly, adenopathy  Lungs: Respirations unlabored;  Neurologic: Alert and oriented; speech intact; face symmetrical; moves all extremities well; CNII-XII intact without focal deficit   Assessment:  1. Tooth pain     Plan:  Rx for Augmentin  875 mg bid x 10 days; encouraged to try and see her dentist in the next 24 hours.   No follow-ups on file.  No orders of the defined types were placed in this encounter.   Requested Prescriptions   Signed Prescriptions Disp Refills  . amoxicillin-clavulanate (AUGMENTIN) 875-125 MG tablet 20 tablet 0    Sig: Take 1 tablet by mouth 2 (two) times daily.

## 2018-02-13 ENCOUNTER — Other Ambulatory Visit: Payer: Self-pay | Admitting: Internal Medicine

## 2018-02-13 ENCOUNTER — Telehealth: Payer: Self-pay | Admitting: Internal Medicine

## 2018-02-13 NOTE — Telephone Encounter (Signed)
Insulin Pen Needles by Historic provider  LRF 01/20/18 100  0 refills  LOV 01/29/18 Dr. Quay Burow  [facility]

## 2018-02-13 NOTE — Telephone Encounter (Signed)
Copied from [place] [id number]. Topic: Quick Communication - See Telephone Encounter >> Feb 13, 2018  4:25 PM Ivar Drape wrote: CRM for notification. See Telephone encounter for: 02/13/18. Patient would like a refill on her Insulin Pen Needle (PEN NEEDLES 3/16") 31G X 5 MM MISC and have it [facility] [address]

## 2018-02-14 ENCOUNTER — Telehealth: Payer: Self-pay | Admitting: Internal Medicine

## 2018-02-14 DIAGNOSIS — R0789 Other chest pain: Secondary | ICD-10-CM | POA: Diagnosis not present

## 2018-02-14 DIAGNOSIS — F329 Major depressive disorder, single episode, unspecified: Secondary | ICD-10-CM | POA: Diagnosis not present

## 2018-02-14 DIAGNOSIS — R002 Palpitations: Secondary | ICD-10-CM | POA: Diagnosis not present

## 2018-02-14 DIAGNOSIS — Z7952 Long term (current) use of systemic steroids: Secondary | ICD-10-CM | POA: Diagnosis not present

## 2018-02-14 DIAGNOSIS — E890 Postprocedural hypothyroidism: Secondary | ICD-10-CM | POA: Diagnosis not present

## 2018-02-14 DIAGNOSIS — D649 Anemia, unspecified: Secondary | ICD-10-CM | POA: Diagnosis not present

## 2018-02-14 DIAGNOSIS — E1100 Type 2 diabetes mellitus with hyperosmolarity without nonketotic hyperglycemic-hyperosmolar coma (NKHHC): Secondary | ICD-10-CM | POA: Diagnosis not present

## 2018-02-14 DIAGNOSIS — Z794 Long term (current) use of insulin: Secondary | ICD-10-CM | POA: Diagnosis not present

## 2018-02-14 DIAGNOSIS — M316 Other giant cell arteritis: Secondary | ICD-10-CM | POA: Diagnosis not present

## 2018-02-14 DIAGNOSIS — E1110 Type 2 diabetes mellitus with ketoacidosis without coma: Secondary | ICD-10-CM | POA: Diagnosis not present

## 2018-02-14 DIAGNOSIS — K219 Gastro-esophageal reflux disease without esophagitis: Secondary | ICD-10-CM | POA: Diagnosis not present

## 2018-02-14 DIAGNOSIS — I10 Essential (primary) hypertension: Secondary | ICD-10-CM | POA: Diagnosis not present

## 2018-02-14 MED ORDER — "PEN NEEDLES 3/16"" 31G X 5 MM MISC": 12.0000 [IU] | Freq: Three times a day (TID) | 5 refills | Status: DC

## 2018-02-14 NOTE — Telephone Encounter (Signed)
Copied from [place]. Topic: Quick Communication - See Telephone Encounter >> Feb 14, 2018  3:54 PM Berneta Levins wrote: CRM for notification. See Telephone encounter for: 02/14/18.  Donita from [place] calling.  States pt is being discharged from [facility] today due to copay. Blood pressure 146/96 Heart rate 100 Still having headaches

## 2018-02-14 NOTE — Telephone Encounter (Signed)
Rx has been sent to pharmacy

## 2018-02-17 DIAGNOSIS — R700 Elevated erythrocyte sedimentation rate: Secondary | ICD-10-CM | POA: Diagnosis not present

## 2018-02-17 DIAGNOSIS — B370 Candidal stomatitis: Secondary | ICD-10-CM | POA: Diagnosis not present

## 2018-02-17 DIAGNOSIS — R51 Headache: Secondary | ICD-10-CM | POA: Diagnosis not present

## 2018-02-17 MED ORDER — NEBIVOLOL HCL 5 MG PO TABS: 5.0000 mg | ORAL_TABLET | Freq: Every day | ORAL | 5 refills | Status: DC

## 2018-02-17 NOTE — Telephone Encounter (Signed)
Call her - we should add something for her BP -- we can try bystolic - this should reduce her BP and HR but not have the side effects of the metoprolol.  It should be covered by her insurance - if not she should let us know.  I hope reducing her BP may also improve the headaches.   Pending if she is willing to try it.

## 2018-02-17 NOTE — Telephone Encounter (Signed)
Bystolic sent to pharmacy. Pt is aware of response below.

## 2018-02-19 ENCOUNTER — Telehealth: Payer: Self-pay | Admitting: Emergency Medicine

## 2018-02-19 NOTE — Telephone Encounter (Signed)
PA completed. Key [id number]

## 2018-02-21 MED ORDER — CARVEDILOL 3.125 MG PO TABS: 3.1250 mg | ORAL_TABLET | Freq: Two times a day (BID) | ORAL | 3 refills | Status: DC

## 2018-02-21 NOTE — Telephone Encounter (Signed)
LVM for pt to call back in regards.  

## 2018-02-21 NOTE — Telephone Encounter (Signed)
Lets try coreg - sent to pof

## 2018-02-21 NOTE — Telephone Encounter (Signed)
PA for Bystolic was denied. Insurance wants alternative medications tried first. Atenolol, bisoprolol, or carvedilol. Please advise.

## 2018-02-25 MED ORDER — INSULIN ASPART 100 UNIT/ML FLEXPEN: 12.0000 [IU] | PEN_INJECTOR | Freq: Three times a day (TID) | SUBCUTANEOUS | 1 refills | Status: DC

## 2018-02-25 MED ORDER — INSULIN GLARGINE 100 UNIT/ML SOLOSTAR PEN: 34.0000 [IU] | PEN_INJECTOR | Freq: Every day | SUBCUTANEOUS | 1 refills | Status: DC

## 2018-02-25 NOTE — Telephone Encounter (Signed)
Pt aware of new BP medication.

## 2018-03-03 ENCOUNTER — Telehealth: Payer: Self-pay | Admitting: Internal Medicine

## 2018-03-03 NOTE — Telephone Encounter (Signed)
Copied from [place] [phone]. Topic: General - Other >> Mar 03, 2018  4:17 PM Rutherford Nail, NT wrote: Reason for CRM: Patient calling and states that [employer] said that they have faxed over some paperwork regarding the patient's hospital stay. Patient calling to check the status of this. Please advise. CB#: [phone]

## 2018-03-04 ENCOUNTER — Telehealth: Payer: Self-pay | Admitting: Obstetrics and Gynecology

## 2018-03-04 NOTE — Telephone Encounter (Signed)
Called and left a message for patient to call back to schedule a new patient doctor referral appointment with our office to see any provider to establish care. She requested a call back next week.

## 2018-03-04 NOTE — Telephone Encounter (Signed)
Spoke with patient and informed her we have not got these forms yet. She is going to follow up with [employer] - I gave her the main fax # & side B to make sure they had the right information.

## 2018-03-04 NOTE — Telephone Encounter (Signed)
I have not seen this paperwork. Have you seen this by chance?

## 2018-03-04 NOTE — Progress Notes (Signed)
Subjective:    Patient ID: Erin [name], female    DOB: January 12, 1966, 52 y.o.   MRN: [id number]  HPI The patient is here for follow up.  Palpitations, Hypertension: She is taking her medication daily. She is compliant with a low sodium diet.  She does have palpitations, but this is not new.  Her HR has been consistently elevated at home.  She has daily headaches.  She has SOB with exertion, which is not new.  She denies chest pain, edema. She is not exercising regularly.  She does not monitor her blood pressure at home.    Diabetes: She is taking her medication daily as prescribed. She is compliant with a diabetic diet. She is not exercising regularly. She monitors her sugars and they have been running 120 - 170.   Oropharyngeal candidiasis:  She took dilfucan 200 mg dialy for 3 weeks and then has been taking it weekly for prevention. She still hs a feeling of thrush in her esophagus when she swallows. She is still on prednisone - her dose was recently decreased to 10 mg daily.    When she stands for a period of time she has right hip pain and goes down the leg.  She states it is a tingling pain.  She is taking prednisone daily. She is unsure what she can take for the pain.    Medications and allergies reviewed with patient and updated if appropriate.  Patient Active Problem List   Diagnosis Date Noted  . Anxiety 03/05/2018  . Hyperlipidemia 01/23/2018  . Elevated liver enzymes   . Oropharyngeal candidiasis 01/16/2018  . Vaginal candidiasis 01/16/2018  . Diabetes mellitus type 2, uncomplicated ([place]) 17/71/1657  . Hyperosmolar non-ketotic state in patient with type 2 diabetes mellitus ([place]) 01/16/2018  . DKA (diabetic ketoacidoses) ([place]) 01/15/2018  . Left facial pressure and pain 11/09/2017  . Thrush 11/09/2017  . Gastroesophageal reflux disease 10/05/2017  . Temporal arteritis ([place]) 10/03/2017  . Right foot pain 07/15/2017  . Grief at loss of child 11/02/2016  . H/O gestational  diabetes mellitus, not currently pregnant 09/28/2015  . Hypertension 09/28/2015  . Morbid obesity ([place]) 05/17/2015  . Umbilical hernia 90/38/3338  . Chronic lower back pain 05/17/2015  . Snoring 05/17/2015  . Palpitations 05/17/2015  . ANEMIA 09/03/2007  . Hypothyroidism following radioiodine therapy 09/02/2007  . INTERSTITIAL CYSTITIS 09/01/2007  . ALLERGIC RHINITIS 08/07/2007  . [place] DISEASE, LUMBAR 08/07/2007    Current Outpatient Medications on File Prior to Visit  Medication Sig Dispense Refill  . ALPRAZolam (XANAX) 0.5 MG tablet Take 1 tablet (0.5 mg total) by mouth 2 (two) times daily as needed for anxiety. 30 tablet 0  . antiseptic oral rinse (BIOTENE) LIQD 15 mLs by Mouth Rinse route as needed for dry mouth or mouth pain.    [name] BAYER MICROLET LANCETS lancets TEST TWICE DAILY AS DIRECTED 100 each 0  . blood glucose meter kit and supplies KIT Dispense based on patient and insurance preference. Use up to twice daily. 1 each 0  . cholecalciferol (VITAMIN D) 1000 units tablet Take 1,000 Units by mouth daily.    . clotrimazole (GYNE-LOTRIMIN) 1 % vaginal cream Place 1 Applicatorful vaginally at bedtime. 45 g 0  . CONTOUR TEST test strip TEST TWICE DAILY 100 each 1  . ibuprofen (ADVIL,MOTRIN) 400 MG tablet Take 1 tablet (400 mg total) by mouth every 6 (six) hours as needed for fever, headache or mild pain (use first). 30 tablet 0  .  insulin aspart (NOVOLOG FLEXPEN) 100 UNIT/ML FlexPen Inject 12 Units into the skin 3 (three) times daily with meals. 15 mL 1  . Insulin Pen Needle (PEN NEEDLES 3/16") 31G X 5 MM MISC 12 Units by Does not apply route 3 (three) times daily before meals. 100 each 5  . levonorgestrel (MIRENA, 52 MG,) 20 MCG/24HR IUD Mirena 20 mcg/24 hr (5 years) intrauterine device  Take 1 device by intrauterine route.    [name] levothyroxine (SYNTHROID, LEVOTHROID) 125 MCG tablet Take 1 tablet (125 mcg total) by mouth daily before breakfast. 30 tablet 1  . Olopatadine HCl (PATADAY)  0.2 % SOLN Pataday 0.2 % eye drops  INSTILL 1 DROP INTO AFFECTED EYE(S) BY OPHTHALMIC ROUTE ONCE DAILY PRN    . ondansetron (ZOFRAN ODT) 4 MG disintegrating tablet Take 1 tablet (4 mg total) by mouth every 8 (eight) hours as needed for nausea or vomiting. 20 tablet 0  . pantoprazole (PROTONIX) 40 MG tablet Take 1 tablet (40 mg total) by mouth daily. Take 30 minutes prior to a meal 90 tablet 3  . predniSONE (DELTASONE) 10 MG tablet Take 10 mg by mouth daily with breakfast.      No current facility-administered medications on file prior to visit.     Past Medical History:  Diagnosis Date  . Anemia   . Depression   . Diabetes mellitus without complication (HCC)    gestational  . Elevated blood-pressure reading without diagnosis of hypertension   . Endometriosis   . Fibroids   . GERD (gastroesophageal reflux disease)   . Graves disease   . Grief at loss of child   . Hypertension   . Hypothyroidism   . Menorrhagia   . Oropharyngeal candidiasis 01/16/2018  . Premature ventricular contractions    a. rare PVC by monitor 12/2016.  [name] Thyroid disease   . Uterine leiomyoma     Past Surgical History:  Procedure Laterality Date  . APPENDECTOMY    . ARTERY BIOPSY Left 10/25/2017   Procedure: BIOPSY TEMPORAL ARTERY;  Surgeon: Aviva Signs, MD;  Location: [facility];  Service: General;  Laterality: Left;  . CESAREAN SECTION    . CHOLECYSTECTOMY    . HERNIA REPAIR     incisional  . TUMOR REMOVAL  fibroids    Social History   Socioeconomic History  . Marital status: Married    Spouse name: Not on file  . Number of children: Not on file  . Years of education: Not on file  . Highest education level: Not on file  Occupational History  . Not on file  Social Needs  . Financial resource strain: Not on file  . Food insecurity:    Worry: Not on file    Inability: Not on file  . Transportation needs:    Medical: Not on file    Non-medical: Not on file  Tobacco Use  . Smoking status: Never  Smoker  . Smokeless tobacco: Never Used  Substance and Sexual Activity  . Alcohol use: No  . Drug use: No  . Sexual activity: Yes    Birth control/protection: IUD  Lifestyle  . Physical activity:    Days per week: Not on file    Minutes per session: Not on file  . Stress: Not on file  Relationships  . Social connections:    Talks on phone: Not on file    Gets together: Not on file    Attends religious service: Not on file    Active member of  club or organization: Not on file    Attends meetings of clubs or organizations: Not on file    Relationship status: Not on file  Other Topics Concern  . Not on file  Social History Narrative  . Not on file    Family History  Problem Relation Age of Onset  . Hypertension Mother   . Cancer Mother   . Hypertension Father   . Diabetes Father   . Cancer Father     Review of Systems  Constitutional: Negative for fever.  HENT: Negative for trouble swallowing.   Respiratory: Positive for shortness of breath. Negative for cough and wheezing.   Cardiovascular: Positive for palpitations. Negative for chest pain and leg swelling.  Gastrointestinal:       No gerd  Neurological: Positive for headaches (dull pain, constant).       Objective:   Vitals:   03/05/18 1405  BP: 122/74  Pulse: (!) 105  Resp: 18  Temp: 98.9 F (37.2 C)  SpO2: 96%   BP Readings from Last 3 Encounters:  03/05/18 122/74  02/05/18 132/88  01/29/18 (!) 132/94   Wt Readings from Last 3 Encounters:  03/05/18 (!) 346 lb (156.9 kg)  02/05/18 (!) 334 lb (151.5 kg)  01/29/18 (!) 334 lb (151.5 kg)   Body mass index is 57.58 kg/m.   Physical Exam    Constitutional: Appears well-developed and well-nourished. No distress.  HENT:  Head: Normocephalic and atraumatic.  Neck: Neck supple. No tracheal deviation present. No thyromegaly present.  No cervical lymphadenopathy Cardiovascular: mildly elevated rate, regular rhythm and normal heart sounds.   No murmur  heard. No carotid bruit .  No edema Pulmonary/Chest: Effort normal and breath sounds normal. No respiratory distress. No has no wheezes. No rales.  Skin: Skin is warm and dry. Not diaphoretic.  Psychiatric: Normal mood and affect. Behavior is normal.      Assessment & Plan:    See Problem List for Assessment and Plan of chronic medical problems.

## 2018-03-05 ENCOUNTER — Encounter: Payer: Self-pay | Admitting: Internal Medicine

## 2018-03-05 ENCOUNTER — Telehealth: Payer: Self-pay | Admitting: Internal Medicine

## 2018-03-05 ENCOUNTER — Ambulatory Visit (INDEPENDENT_AMBULATORY_CARE_PROVIDER_SITE_OTHER): Payer: BLUE CROSS/BLUE SHIELD | Admitting: Internal Medicine

## 2018-03-05 VITALS — BP 122/74 | HR 105 | Temp 98.9°F | Resp 18 | Ht 65.0 in | Wt 346.0 lb

## 2018-03-05 DIAGNOSIS — F419 Anxiety disorder, unspecified: Secondary | ICD-10-CM

## 2018-03-05 DIAGNOSIS — E890 Postprocedural hypothyroidism: Secondary | ICD-10-CM

## 2018-03-05 DIAGNOSIS — R002 Palpitations: Secondary | ICD-10-CM | POA: Diagnosis not present

## 2018-03-05 DIAGNOSIS — E782 Mixed hyperlipidemia: Secondary | ICD-10-CM

## 2018-03-05 DIAGNOSIS — R519 Headache, unspecified: Secondary | ICD-10-CM | POA: Insufficient documentation

## 2018-03-05 DIAGNOSIS — B370 Candidal stomatitis: Secondary | ICD-10-CM

## 2018-03-05 DIAGNOSIS — R51 Headache: Secondary | ICD-10-CM | POA: Diagnosis not present

## 2018-03-05 DIAGNOSIS — E119 Type 2 diabetes mellitus without complications: Secondary | ICD-10-CM | POA: Diagnosis not present

## 2018-03-05 DIAGNOSIS — I10 Essential (primary) hypertension: Secondary | ICD-10-CM

## 2018-03-05 DIAGNOSIS — G8929 Other chronic pain: Secondary | ICD-10-CM

## 2018-03-05 MED ORDER — FLUCONAZOLE 200 MG PO TABS: 400.0000 mg | ORAL_TABLET | Freq: Every day | ORAL | 0 refills | Status: DC

## 2018-03-05 MED ORDER — INSULIN GLARGINE 100 UNIT/ML SOLOSTAR PEN: 30.0000 [IU] | PEN_INJECTOR | Freq: Every day | SUBCUTANEOUS | 1 refills | Status: DC

## 2018-03-05 MED ORDER — LOSARTAN POTASSIUM 100 MG PO TABS: 50.0000 mg | ORAL_TABLET | Freq: Every day | ORAL | 3 refills | Status: DC

## 2018-03-05 MED ORDER — GABAPENTIN 100 MG PO CAPS: 200.0000 mg | ORAL_CAPSULE | Freq: Every day | ORAL | 3 refills | Status: DC

## 2018-03-05 MED ORDER — DILTIAZEM HCL ER COATED BEADS 120 MG PO TB24: 120.0000 mg | ORAL_TABLET | Freq: Every day | ORAL | 5 refills | Status: DC

## 2018-03-05 NOTE — Assessment & Plan Note (Addendum)
Still with symptoms Prednisone recently decreased to 10 mg daily start diflucan 400 mg x 21 days

## 2018-03-05 NOTE — Telephone Encounter (Signed)
We can try gabapentin at night - this is a nerve pain medication - sent to pharmacy -- start at a low dose - this can be increaesed but can cause drowsiness in the morning so we will increase slowly.  If there is no improvement can refer to sports medicine.

## 2018-03-05 NOTE — Assessment & Plan Note (Addendum)
Taking xanax daily am Will continue for now

## 2018-03-05 NOTE — Telephone Encounter (Signed)
Copied from [place] [id number]. Topic: Quick Communication - Rx Refill/Question >> Mar 05, 2018  3:47 PM Margot Ables wrote: Medication: pt called to see if medication was going to be sent in for the sciatic pain that was discussed with Dr. Quay Burow today. Please call to notify pt.  Preferred Pharmacy (with phone number or street name): [id number] [place], [place] [phone] (Phone) [phone] (Fax)

## 2018-03-05 NOTE — Telephone Encounter (Signed)
Called patient for another reason and she asked about this.  I have informed her of the notes below and she understood.

## 2018-03-05 NOTE — Assessment & Plan Note (Signed)
Sugars controlled at home lantus dose decreased when she decreased her prednisone Do not need sugars to be less than 120  -- if they are will need to dec insulin Will contact me with questions regarding changing insulin Will decrease prednisone soon - will likely need to decrease insulin

## 2018-03-05 NOTE — Patient Instructions (Addendum)
Have blood work done next week or the week after.   Test(s) ordered today. Your results will be released to [place] (or called to you) after review, usually within 72hours after test completion. If any changes need to be made, you will be notified at that same time.   Medications reviewed and updated.  Changes include diflucan 400 mg for 3 weeks.  Decrease losartan 50 mg and start Cardizem.    Your prescription(s) have been submitted to your pharmacy. Please take as directed and contact our office if you believe you are having problem(s) with the medication(s).    Please followup in 1-2 months

## 2018-03-05 NOTE — Assessment & Plan Note (Signed)
Following with endocrine Concerned about thyroid - does not see endo for a while Will check tsh next week since we are getting blood work

## 2018-03-05 NOTE — Assessment & Plan Note (Signed)
Daily headache - likely from OSA I have referred her for evaluation in the past but she did not make an appointment -- stressed evaluation for OSA and treatment if needed Will refer to [place]

## 2018-03-05 NOTE — Assessment & Plan Note (Signed)
BP good today but has been high at home Given mild tachycardia and palpitations will start cardizem LA 120 mg daily - has not tolerated metoprolol and needs a once daily medication Decrease losartan to 50 mg daily Will adjust medication as needed Monitor BP

## 2018-03-05 NOTE — Assessment & Plan Note (Signed)
Not on cholesterol lower medication but should be given diabetes Given so many changes and active issues will hold off on medication at this time

## 2018-03-05 NOTE — Assessment & Plan Note (Signed)
Has had chronic palpitations - no change, but HR has been persistently elevated cardizem will be started

## 2018-03-07 ENCOUNTER — Other Ambulatory Visit: Payer: BLUE CROSS/BLUE SHIELD

## 2018-03-08 ENCOUNTER — Other Ambulatory Visit: Payer: Self-pay | Admitting: Endocrinology

## 2018-03-09 ENCOUNTER — Other Ambulatory Visit: Payer: Self-pay | Admitting: Internal Medicine

## 2018-03-10 ENCOUNTER — Other Ambulatory Visit: Payer: Self-pay | Admitting: Endocrinology

## 2018-03-10 MED ORDER — UNITHROID 125 MCG PO TABS: 125.0000 ug | ORAL_TABLET | Freq: Every day | ORAL | 0 refills | Status: DC

## 2018-03-10 NOTE — Telephone Encounter (Signed)
Please advise 

## 2018-03-10 NOTE — Telephone Encounter (Signed)
Yes - can take just one cap at bedtime only -- she can take one in the evening and one at bedtime.   She can take one in the morning and one at bedtime -- whatever she tolerates.

## 2018-03-10 NOTE — Telephone Encounter (Signed)
Pt called stating that gabapentin is making her too drowsy but helps tremendously with the pain. Pt would like to know if she could space out the two tablets rather than taking them at once. Please advise.

## 2018-03-11 ENCOUNTER — Other Ambulatory Visit: Payer: Self-pay | Admitting: Endocrinology

## 2018-03-11 NOTE — Telephone Encounter (Signed)
Pt aware of response below.  

## 2018-03-11 NOTE — Telephone Encounter (Signed)
Pt has OV on 9.18.19 will refill at that time if appropriate/states that this was Erin [name]/C'ed on 7.29.19 for pt to stop taking at [facility]/C/thx dmf

## 2018-03-12 ENCOUNTER — Ambulatory Visit: Payer: BLUE CROSS/BLUE SHIELD | Admitting: Endocrinology

## 2018-03-12 ENCOUNTER — Telehealth: Payer: Self-pay | Admitting: Certified Nurse Midwife

## 2018-03-12 NOTE — Telephone Encounter (Signed)
Called and left a message for patient to call back to schedule a new patient doctor referral appointment with our office to see any provider to establish care. She requested a call back next week.

## 2018-03-13 ENCOUNTER — Other Ambulatory Visit (INDEPENDENT_AMBULATORY_CARE_PROVIDER_SITE_OTHER): Payer: BLUE CROSS/BLUE SHIELD

## 2018-03-13 ENCOUNTER — Encounter: Payer: Self-pay | Admitting: Internal Medicine

## 2018-03-13 DIAGNOSIS — R51 Headache: Secondary | ICD-10-CM | POA: Diagnosis not present

## 2018-03-13 DIAGNOSIS — I10 Essential (primary) hypertension: Secondary | ICD-10-CM

## 2018-03-13 DIAGNOSIS — R519 Headache, unspecified: Secondary | ICD-10-CM

## 2018-03-13 DIAGNOSIS — E119 Type 2 diabetes mellitus without complications: Secondary | ICD-10-CM

## 2018-03-13 DIAGNOSIS — E890 Postprocedural hypothyroidism: Secondary | ICD-10-CM

## 2018-03-13 DIAGNOSIS — E782 Mixed hyperlipidemia: Secondary | ICD-10-CM | POA: Diagnosis not present

## 2018-03-13 LAB — COMPREHENSIVE METABOLIC PANEL
ALT: 38 U/L — ABNORMAL HIGH (ref 0–35)
AST: 17 U/L (ref 0–37)
Albumin: 3.9 g/dL (ref 3.5–5.2)
Alkaline Phosphatase: 189 U/L — ABNORMAL HIGH (ref 39–117)
BUN: 19 mg/dL (ref 6–23)
CO2: 27 mEq/L (ref 19–32)
Calcium: 9.5 mg/dL (ref 8.4–10.5)
Chloride: 105 mEq/L (ref 96–112)
Creatinine, Ser: 0.71 mg/dL (ref 0.40–1.20)
GFR: 111.33 mL/min (ref >60.00)
Glucose, Bld: 164 mg/dL — ABNORMAL HIGH (ref 70–99)
Potassium: 3.8 mEq/L (ref 3.5–5.1)
Sodium: 141 mEq/L (ref 135–145)
Total Bilirubin: 0.4 mg/dL (ref 0.2–1.2)
Total Protein: 6.9 g/dL (ref 6.0–8.3)

## 2018-03-13 LAB — HEMOGLOBIN A1C: Hgb A1c MFr Bld: 10.3 % — ABNORMAL HIGH (ref 4.6–6.5)

## 2018-03-13 LAB — CBC WITH DIFFERENTIAL/PLATELET
Basophils Absolute: 0.2 10*3/uL — ABNORMAL HIGH (ref 0.0–0.1)
Basophils Relative: 1.1 % (ref 0.0–3.0)
Eosinophils Absolute: 0 10*3/uL (ref 0.0–0.7)
Eosinophils Relative: 0.2 % (ref 0.0–5.0)
HCT: 33.4 % — ABNORMAL LOW (ref 36.0–46.0)
Hemoglobin: 10.9 g/dL — ABNORMAL LOW (ref 12.0–15.0)
Lymphocytes Relative: 20.5 % (ref 12.0–46.0)
Lymphs Abs: 3 10*3/uL (ref 0.7–4.0)
MCHC: 32.7 g/dL (ref 30.0–36.0)
MCV: 83.6 fl (ref 78.0–100.0)
Monocytes Absolute: 0.5 10*3/uL (ref 0.1–1.0)
Monocytes Relative: 3.6 % (ref 3.0–12.0)
Neutro Abs: 10.7 10*3/uL — ABNORMAL HIGH (ref 1.4–7.7)
Neutrophils Relative %: 74.6 % (ref 43.0–77.0)
Platelets: 314 10*3/uL (ref 150.0–400.0)
RBC: 3.99 Mil/uL (ref 3.87–5.11)
RDW: 15.2 % (ref 11.5–15.5)
WBC: 14.4 10*3/uL — ABNORMAL HIGH (ref 4.0–10.5)

## 2018-03-13 LAB — TSH: TSH: 0.43 u[IU]/mL (ref 0.35–4.50)

## 2018-03-14 ENCOUNTER — Ambulatory Visit: Payer: BLUE CROSS/BLUE SHIELD | Admitting: Internal Medicine

## 2018-03-23 ENCOUNTER — Other Ambulatory Visit: Payer: Self-pay | Admitting: Internal Medicine

## 2018-03-24 NOTE — Telephone Encounter (Signed)
[place] Controlled Substance Database checked. Last filled on 01/29/18  Last OV 03/05/18

## 2018-03-25 ENCOUNTER — Other Ambulatory Visit: Payer: Self-pay | Admitting: Internal Medicine

## 2018-03-25 ENCOUNTER — Encounter: Payer: Self-pay | Admitting: Obstetrics and Gynecology

## 2018-03-26 ENCOUNTER — Telehealth: Payer: Self-pay

## 2018-03-26 MED ORDER — FLUCONAZOLE 200 MG PO TABS: 400.0000 mg | ORAL_TABLET | Freq: Every day | ORAL | 0 refills | Status: DC

## 2018-03-26 NOTE — Telephone Encounter (Signed)
rx resent  

## 2018-03-26 NOTE — Telephone Encounter (Signed)
Copied from [place] [id number]. Topic: General - Other >> Mar 25, 2018  3:36 PM Janace Aris A wrote: Reason for CRM: Patient called in saying that her pharmacy needs clarification on medication usage for the patient. Says they faxed something over to the office a week ago but have not gotten a response yet.    Please advise   Medication : fluconazole (DIFLUCAN) 200 MG tablet

## 2018-03-26 NOTE — Telephone Encounter (Signed)
Pt aware.

## 2018-03-27 ENCOUNTER — Other Ambulatory Visit: Payer: Self-pay | Admitting: Internal Medicine

## 2018-03-27 DIAGNOSIS — R51 Headache: Secondary | ICD-10-CM | POA: Diagnosis not present

## 2018-03-27 DIAGNOSIS — I10 Essential (primary) hypertension: Secondary | ICD-10-CM | POA: Diagnosis not present

## 2018-03-27 DIAGNOSIS — B370 Candidal stomatitis: Secondary | ICD-10-CM | POA: Diagnosis not present

## 2018-03-27 DIAGNOSIS — R700 Elevated erythrocyte sedimentation rate: Secondary | ICD-10-CM | POA: Diagnosis not present

## 2018-04-04 ENCOUNTER — Ambulatory Visit: Payer: BLUE CROSS/BLUE SHIELD | Admitting: Obstetrics and Gynecology

## 2018-04-10 ENCOUNTER — Ambulatory Visit (INDEPENDENT_AMBULATORY_CARE_PROVIDER_SITE_OTHER): Payer: BLUE CROSS/BLUE SHIELD | Admitting: Internal Medicine

## 2018-04-10 ENCOUNTER — Encounter: Payer: Self-pay | Admitting: Internal Medicine

## 2018-04-10 VITALS — BP 126/82 | HR 105 | Ht 65.0 in | Wt 350.0 lb

## 2018-04-10 DIAGNOSIS — E890 Postprocedural hypothyroidism: Secondary | ICD-10-CM

## 2018-04-10 MED ORDER — UNITHROID 100 MCG PO TABS: 100.0000 ug | ORAL_TABLET | Freq: Every day | ORAL | 2 refills | Status: DC

## 2018-04-10 NOTE — Progress Notes (Signed)
Patient ID: Erin [name], female   DOB: 05/17/1966, 52 y.o.   MRN: [id number]    HPI  Erin [name] is a 52 y.o.-year-old female, referred by her PCP, Dr. Quay Burow, for management of postablative hypothyroidism.  She previously saw Dr. Dwyane Dee who would like to switch to seeing me.  Pt. has been dx with hypothyroidism after RAI treatment for Graves' disease in 08/2008 >> on Unithroid d.a.w. 125 mcg. She had a lot of variability in the TFTs on generic LT4.  She takes the thyroid hormone: - fasting - with water - separated by >30 min from b'fast  - On Protonix 30 min before Unithroid!! - no calcium, iron - + multivitamins later in the pm - no Biotin  I reviewed pt's thyroid tests and they normalized recently: Lab Results  Component Value Date   TSH 0.43 03/13/2018   TSH 1.06 01/29/2018   TSH 0.288 (L) 01/15/2018   TSH 0.14 (L) 11/27/2017   TSH 0.09 (L) 08/27/2017   TSH 18.55 (H) 06/27/2017   TSH 0.16 (L) 10/29/2016   TSH 10.45 (H) 02/14/2016   TSH 0.22 (L) 07/25/2015   TSH 4.48 11/17/2014   FREET4 1.14 11/27/2017   FREET4 1.11 08/27/2017   FREET4 0.67 06/27/2017   FREET4 1.17 10/29/2016   FREET4 0.72 02/14/2016   FREET4 1.07 07/25/2015   FREET4 0.81 11/17/2014   FREET4 0.75 08/03/2014   FREET4 0.68 12/01/2013   FREET4 0.80 05/05/2013   T3FREE 6.7 (H) 09/04/2007   Antithyroid antibodies: No results found for: THGAB No components found for: TPOAB  Pt describes: - + weight gain and loss - + fatigue - + Both heat and cold intolerance - no  constipation or diarrhea - + no dry skin, itching - hair loss  Pt denies feeling nodules in neck, hoarseness, dysphagia (had this during her Graves ds. Dx)/odynophagia, SOB with lying down.  She has + FH of thyroid disorders in: mother, M aunts. No FH of thyroid cancer.  No h/o radiation tx to head or neck other than RAI tx. No recent use of iodine supplements.  Pt. also has a history of uncontrolled diabetes, with latest HbA1c  improved at 10.3%.  She has a history of DKA in 12/2017.  This is currently managed by PCP.  She was on high doses on Prednisone (60 mg) >> 2.5 mg now. HbA1c coming down.  ROS: + see HPI Constitutional: + see HPI Eyes: + Blurry vision, no xerophthalmia ENT: + Sore throat, + see HPI, + tinnitus Cardiovascular: no CP/+ SOB/+ palpitations/no leg swelling Respiratory: no cough/+ SOB Gastrointestinal: no N/V/D/C Musculoskeletal: no muscle/joint aches Skin: no rashes Neurological: no tremors/numbness/tingling/dizziness, + headaches  Past Medical History:  Diagnosis Date  . Anemia   . Depression   . Diabetes mellitus without complication (HCC)    gestational  . Elevated blood-pressure reading without diagnosis of hypertension   . Endometriosis   . Fibroids   . GERD (gastroesophageal reflux disease)   . Graves disease   . Grief at loss of child   . Hypertension   . Hypothyroidism   . Menorrhagia   . Oropharyngeal candidiasis 01/16/2018  . Premature ventricular contractions    a. rare PVC by monitor 12/2016.  [name] Thyroid disease   . Uterine leiomyoma    Past Surgical History:  Procedure Laterality Date  . APPENDECTOMY    . ARTERY BIOPSY Left 10/25/2017   Procedure: BIOPSY TEMPORAL ARTERY;  Surgeon: Aviva Signs, MD;  Location: [facility];  Service: General;  Laterality: Left;  . CESAREAN SECTION    . CHOLECYSTECTOMY    . HERNIA REPAIR     incisional  . TUMOR REMOVAL  fibroids   Social History   Socioeconomic History  . Marital status: Married    Spouse name: Not on file  . Number of children: Not on file  . Years of education: Not on file  . Highest education level: Not on file  Occupational History  . Not on file  Social Needs  . Financial resource strain: Not on file  . Food insecurity:    Worry: Not on file    Inability: Not on file  . Transportation needs:    Medical: Not on file    Non-medical: Not on file  Tobacco Use  . Smoking status: Never Smoker  . Smokeless  tobacco: Never Used  Substance and Sexual Activity  . Alcohol use: No  . Drug use: No  . Sexual activity: Yes    Birth control/protection: IUD  Lifestyle  . Physical activity:    Days per week: Not on file    Minutes per session: Not on file  . Stress: Not on file  Relationships  . Social connections:    Talks on phone: Not on file    Gets together: Not on file    Attends religious service: Not on file    Active member of club or organization: Not on file    Attends meetings of clubs or organizations: Not on file    Relationship status: Not on file  . Intimate partner violence:    Fear of current or ex partner: Not on file    Emotionally abused: Not on file    Physically abused: Not on file    Forced sexual activity: Not on file  Other Topics Concern  . Not on file  Social History Narrative  . Not on file   Current Outpatient Medications on File Prior to Visit  Medication Sig Dispense Refill  . ALPRAZolam (XANAX) 0.5 MG tablet TAKE 1 TABLET (0.5 MG TOTAL) BY MOUTH 2 (TWO) TIMES DAILY AS NEEDED FOR ANXIETY. 30 tablet 0  . antiseptic oral rinse (BIOTENE) LIQD 15 mLs by Mouth Rinse route as needed for dry mouth or mouth pain.    . blood glucose meter kit and supplies KIT Dispense based on patient and insurance preference. Use up to twice daily. 1 each 0  . cholecalciferol (VITAMIN D) 1000 units tablet Take 1,000 Units by mouth daily.    . clotrimazole (GYNE-LOTRIMIN) 1 % vaginal cream Place 1 Applicatorful vaginally at bedtime. 45 g 0  . CONTOUR NEXT TEST test strip TEST TWICE DAILY 200 each 1  . diltiazem (CARDIZEM LA) 120 MG 24 hr tablet Take 1 tablet (120 mg total) by mouth daily. 30 tablet 5  . fluconazole (DIFLUCAN) 200 MG tablet Take 2 tablets (400 mg total) by mouth daily. 42 tablet 0  . gabapentin (NEURONTIN) 100 MG capsule TAKE 2 CAPSULES (200 MG TOTAL) BY MOUTH AT BEDTIME. 180 capsule 2  . ibuprofen (ADVIL,MOTRIN) 400 MG tablet Take 1 tablet (400 mg total) by mouth  every 6 (six) hours as needed for fever, headache or mild pain (use first). 30 tablet 0  . Insulin Glargine (LANTUS SOLOSTAR) 100 UNIT/ML Solostar Pen Inject 30 Units into the skin daily. 15 mL 1  . Insulin Pen Needle (PEN NEEDLES 3/16") 31G X 5 MM MISC 12 Units by Does not apply route 3 (three) times daily before  meals. 100 each 5  . levonorgestrel (MIRENA, 52 MG,) 20 MCG/24HR IUD Mirena 20 mcg/24 hr (5 years) intrauterine device  Take 1 device by intrauterine route.    [name] losartan (COZAAR) 100 MG tablet Take 0.5 tablets (50 mg total) by mouth daily. 45 tablet 3  . MICROLET LANCETS MISC USE TO TEST TWICE DAILY AS DIRECTED 100 each 2  . Olopatadine HCl (PATADAY) 0.2 % SOLN Pataday 0.2 % eye drops  INSTILL 1 DROP INTO AFFECTED EYE(S) BY OPHTHALMIC ROUTE ONCE DAILY PRN    . pantoprazole (PROTONIX) 40 MG tablet Take 1 tablet (40 mg total) by mouth daily. Take 30 minutes prior to a meal 90 tablet 3  . predniSONE (DELTASONE) 10 MG tablet Take 10 mg by mouth daily with breakfast.     . UNITHROID 125 MCG tablet Take 1 tablet (125 mcg total) by mouth daily before breakfast. 90 tablet 0  . insulin aspart (NOVOLOG FLEXPEN) 100 UNIT/ML FlexPen Inject 12 Units into the skin 3 (three) times daily with meals. (Patient not taking: Reported on 04/10/2018) 15 mL 1  . ondansetron (ZOFRAN ODT) 4 MG disintegrating tablet Take 1 tablet (4 mg total) by mouth every 8 (eight) hours as needed for nausea or vomiting. (Patient not taking: Reported on 04/10/2018) 20 tablet 0   No current facility-administered medications on file prior to visit.    Allergies  Allergen Reactions  . Prozac [Fluoxetine Hcl] Nausea Only  . Hydrocodone Hives, Itching and Rash    On thighs    Family History  Problem Relation Age of Onset  . Hypertension Mother   . Cancer Mother   . Hypertension Father   . Diabetes Father   . Cancer Father    PE: BP 126/82   Pulse (!) 105   Ht 5' 5"  (1.651 m) Comment: measured  Wt (!) 350 lb (158.8  kg)   SpO2 97%   BMI 58.24 kg/m  Wt Readings from Last 3 Encounters:  04/10/18 (!) 350 lb (158.8 kg)  03/05/18 (!) 346 lb (156.9 kg)  02/05/18 (!) 334 lb (151.5 kg)   Constitutional: obese, in NAD Eyes: PERRLA, EOMI, no exophthalmos ENT: moist mucous membranes, no thyromegaly, no cervical lymphadenopathy Cardiovascular: tachycardia, RR, No MRG Respiratory: CTA B Gastrointestinal: abdomen soft, NT, ND, BS+ Musculoskeletal: no deformities, strength intact in all 4 Skin: moist, warm, no rashes Neurological: no tremor with outstretched hands, DTR normal in all 4  ASSESSMENT: 1. Hypothyroidism - After RAI treatment for Graves' disease  2. 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 Patient with long-standing uncontrolled hypothyroidism, on levothyroxine DAW therapy.  At this visit, we reviewed how she is taking the Unithroid and it appears that she is taking her PPI 30 minutes before the thyroid hormone.  I explained that in this case she is not absorbing the majority of her LT4.  We discussed about moving levothyroxine first thing in the morning and waiting 30 minutes until eating breakfast or drinking coffee with creamer.  She now takes her blood pressure medicine along with Unithroid and I advised her to move this with breakfast.  We will move the PPI 30 minutes before lunch.  As I am afraid that she will not stop absorbing the entire levothyroxine dose, I advised her to decrease the dose by 25 mcg >> 100 mcg daily. - She has a multitude of symptoms, which sound can be related to hypothyroidism: Weight gain, fatigue, dry skin - she does not appear to have a goiter,  thyroid nodules, or neck compression symptoms - I advised her how to take levothyroxine correctly, fasting, with water, separated by at least 30 minutes from breakfast, and separated by more than 4 hours from calcium, iron, multivitamins, acid reflux medications (PPIs).  - will check thyroid tests in 5 weeks after she starts to take  levothyroxine correctly: TSH, free T4.  At that time, we may need to adjust the dose - Otherwise, I will see her back in 4 months  2. DM 2/2 steroid use - Reviewing her chart, she has uncontrolled diabetes, and I inquired whether she would want me to also manage this.  She tells me that this is managed by PCP for now and this  was related to her steroid dosage.  She did not have a history of diabetes before starting the high-dose steroids for temporal arteritis.  She is currently on 2.5 mg prednisone daily and will stop soon.  Patient Instructions  Please continue Unithroid but decrease the dose to 100 mcg daily.  Move Protonix to 30 min before lunch.  Take Unithroid by itself, first thing in am.  Drink coffee + creamer at least 30 min after the Unithroid.  Take the blood pressure med with b'fast.  Take the thyroid hormone every day, with water, at least 30 minutes before breakfast, separated by at least 4 hours from: - acid reflux medications - calcium - iron - multivitamins  Please come back for labs in 5-6 weeks and for an appointment in 4 months.  Orders Placed This Encounter  Procedures  . TSH  . T4, free   - time spent with the patient and her daughter: 40 minutes, of which >50% was spent in obtaining information about her symptoms, reviewing her previous labs, evaluations, and treatments, counseling her about her condition (please see the discussed topics above), and developing a plan to further investigate and treat it; she had a number of questions which I addressed.  Philemon Kingdom, MD PhD [facility]

## 2018-04-10 NOTE — Patient Instructions (Addendum)
Please continue Unithroid but decrease the dose to 100 mcg daily.  Move Protonix to 30 min before lunch.  Take Unithroid by itself, first thing in am.  Drink coffee + creamer at least 30 min after the Unithroid.  Take the blood pressure med with b'fast.  Take the thyroid hormone every day, with water, at least 30 minutes before breakfast, separated by at least 4 hours from: - acid reflux medications - calcium - iron - multivitamins  Please come back for labs in 5-6 weeks and for an appointment in 4 months.

## 2018-04-17 ENCOUNTER — Ambulatory Visit: Payer: BLUE CROSS/BLUE SHIELD | Admitting: Obstetrics and Gynecology

## 2018-04-21 ENCOUNTER — Other Ambulatory Visit: Payer: Self-pay | Admitting: Internal Medicine

## 2018-04-22 NOTE — Telephone Encounter (Signed)
Last refill was 03/24/18 Last OV was 03/05/18 Next OV is 06/04/2018

## 2018-04-23 ENCOUNTER — Telehealth: Payer: Self-pay | Admitting: Internal Medicine

## 2018-04-23 MED ORDER — LOSARTAN POTASSIUM 50 MG PO TABS: 50.0000 mg | ORAL_TABLET | Freq: Every day | ORAL | 3 refills | Status: DC

## 2018-04-23 NOTE — Telephone Encounter (Signed)
Copied from [place] [id number]. Topic: General - Other >> Apr 23, 2018 10:02 AM Carolyn Stare wrote:      Pt said she was taken off the 100 mg of losartan (COZAAR) 100 MG tablet   and was put on 50mg  Losartan. She said the  pharmacy does not have the new   RX for 50 mg of the Losartan pt said she is out the medication    Pharmacy [facility]

## 2018-04-23 NOTE — Telephone Encounter (Signed)
Resent rx for 50 mg tab.Marland KitchenJohny Chess

## 2018-04-23 NOTE — Addendum Note (Signed)
Addended by: Earnstine Regal on: 04/23/2018 10:19 AM   Modules accepted: Orders

## 2018-04-23 NOTE — Telephone Encounter (Signed)
I called the pharmacy and they verified they never received this in September, please resend.

## 2018-05-01 ENCOUNTER — Encounter: Payer: Self-pay | Admitting: Neurology

## 2018-05-01 ENCOUNTER — Ambulatory Visit (INDEPENDENT_AMBULATORY_CARE_PROVIDER_SITE_OTHER): Payer: BLUE CROSS/BLUE SHIELD | Admitting: Neurology

## 2018-05-01 VITALS — BP 122/84 | HR 100 | Ht 65.0 in | Wt 356.0 lb

## 2018-05-01 DIAGNOSIS — Z9189 Other specified personal risk factors, not elsewhere classified: Secondary | ICD-10-CM | POA: Diagnosis not present

## 2018-05-01 DIAGNOSIS — Z6841 Body Mass Index (BMI) 40.0 and over, adult: Secondary | ICD-10-CM

## 2018-05-01 DIAGNOSIS — R0683 Snoring: Secondary | ICD-10-CM | POA: Diagnosis not present

## 2018-05-01 DIAGNOSIS — R0689 Other abnormalities of breathing: Secondary | ICD-10-CM

## 2018-05-01 DIAGNOSIS — G4719 Other hypersomnia: Secondary | ICD-10-CM

## 2018-05-01 DIAGNOSIS — R519 Headache, unspecified: Secondary | ICD-10-CM

## 2018-05-01 DIAGNOSIS — R0681 Apnea, not elsewhere classified: Secondary | ICD-10-CM

## 2018-05-01 DIAGNOSIS — R002 Palpitations: Secondary | ICD-10-CM

## 2018-05-01 DIAGNOSIS — R51 Headache: Secondary | ICD-10-CM

## 2018-05-01 NOTE — Progress Notes (Signed)
Subjective:    Patient ID: Erin [name] is a 52 y.o. female.  HPI     Star Age, MD, PhD [facility] [address], [address] [place], [place] [address]  Dear Dr. Quay Burow,   I saw your patient, Erin [name], upon your kind request, in my sleep clinic today for initial consultation of her sleep disorder, in particular, concern for underlying obstructive sleep apnea. The patient is accompanied by her daughter today. As you know, Erin [name] is a 52 year old right-handed woman with an underlying complex medical history of hypothyroidism secondary to radioactive iodine treatment for history of Graves' disease, temporal arteritis, hypertension, reflux disease, diabetes, recurrent headaches, low back pain, palpitation, allergic rhinitis, interstitial cystitis and morbid obesity with a BMI of over 55, who reports snoring and excessive daytime somnolence. I reviewed your office note from 03/05/2018. She has woken up with a sense of gasping for air. She has had witnessed apneas per family. She has no known family history of OSA. She has had occasional morning headaches but denies night to night nocturia. She has had weight gain. Bedtime is between 11 and 11:30 and rise time around 5:30. She typically takes a nap in the afternoons. She is a nonsmoker and does not drink caffeine or utilize alcohol. Her Epworth sleepiness score is 8 out of 24 today, fatigue score is 37 out of 63. She lives at home with her husband and 7 children. She facilitates homeschooling for fourth graders. She has had palpitations especially since she was diagnosed with thyroid disease some 10 years ago. She recently tapered off of prednisone.  Her Past Medical History Is Significant For: Past Medical History:  Diagnosis Date  . Anemia   . Depression   . Diabetes mellitus without complication (HCC)    gestational  . Elevated blood-pressure reading without diagnosis of hypertension   .  Endometriosis   . Fibroids   . GERD (gastroesophageal reflux disease)   . Graves disease   . Grief at loss of child   . Hypertension   . Hypothyroidism   . Menorrhagia   . Oropharyngeal candidiasis 01/16/2018  . Premature ventricular contractions    a. rare PVC by monitor 12/2016.  [name] Thyroid disease   . Uterine leiomyoma     Her Past Surgical History Is Significant For: Past Surgical History:  Procedure Laterality Date  . APPENDECTOMY    . ARTERY BIOPSY Left 10/25/2017   Procedure: BIOPSY TEMPORAL ARTERY;  Surgeon: Aviva Signs, MD;  Location: [facility];  Service: General;  Laterality: Left;  . CESAREAN SECTION    . CHOLECYSTECTOMY    . HERNIA REPAIR     incisional  . TUMOR REMOVAL  fibroids    Her Family History Is Significant For: Family History  Problem Relation Age of Onset  . Hypertension Mother   . Cancer Mother   . Hypertension Father   . Diabetes Father   . Cancer Father     Her Social History Is Significant For: Social History   Socioeconomic History  . Marital status: Married    Spouse name: Not on file  . Number of children: Not on file  . Years of education: Not on file  . Highest education level: Not on file  Occupational History  . Not on file  Social Needs  . Financial resource strain: Not on file  . Food insecurity:    Worry: Not on file    Inability: Not on file  . Transportation  needs:    Medical: Not on file    Non-medical: Not on file  Tobacco Use  . Smoking status: Never Smoker  . Smokeless tobacco: Never Used  Substance and Sexual Activity  . Alcohol use: No  . Drug use: No  . Sexual activity: Yes    Birth control/protection: IUD  Lifestyle  . Physical activity:    Days per week: Not on file    Minutes per session: Not on file  . Stress: Not on file  Relationships  . Social connections:    Talks on phone: Not on file    Gets together: Not on file    Attends religious service: Not on file    Active member of club or  organization: Not on file    Attends meetings of clubs or organizations: Not on file    Relationship status: Not on file  Other Topics Concern  . Not on file  Social History Narrative  . Not on file    Her Allergies Are:  Allergies  Allergen Reactions  . Prozac [Fluoxetine Hcl] Nausea Only  . Hydrocodone Hives, Itching and Rash    On thighs   :   Her Current Medications Are:  Outpatient Encounter Medications as of 05/01/2018  Medication Sig  . ALPRAZolam (XANAX) 0.5 MG tablet TAKE 1 TABLET (0.5 MG TOTAL) BY MOUTH 2 (TWO) TIMES DAILY AS NEEDED FOR ANXIETY.  . blood glucose meter kit and supplies KIT Dispense based on patient and insurance preference. Use up to twice daily.  . cholecalciferol (VITAMIN D) 1000 units tablet Take 1,000 Units by mouth daily.  . clotrimazole (GYNE-LOTRIMIN) 1 % vaginal cream Place 1 Applicatorful vaginally at bedtime.  . CONTOUR NEXT TEST test strip TEST TWICE DAILY  . diltiazem (CARDIZEM LA) 120 MG 24 hr tablet Take 1 tablet (120 mg total) by mouth daily.  [name] gabapentin (NEURONTIN) 100 MG capsule TAKE 2 CAPSULES (200 MG TOTAL) BY MOUTH AT BEDTIME.  [name] ibuprofen (ADVIL,MOTRIN) 400 MG tablet Take 1 tablet (400 mg total) by mouth every 6 (six) hours as needed for fever, headache or mild pain (use first).  . Insulin Glargine (LANTUS SOLOSTAR) 100 UNIT/ML Solostar Pen Inject 30 Units into the skin daily.  . Insulin Pen Needle (PEN NEEDLES 3/16") 31G X 5 MM MISC 12 Units by Does not apply route 3 (three) times daily before meals.  [name] levonorgestrel (MIRENA, 52 MG,) 20 MCG/24HR IUD Mirena 20 mcg/24 hr (5 years) intrauterine device  Take 1 device by intrauterine route.  [name] losartan (COZAAR) 50 MG tablet Take 1 tablet (50 mg total) by mouth daily.  [name] MICROLET LANCETS MISC USE TO TEST TWICE DAILY AS DIRECTED  . Olopatadine HCl (PATADAY) 0.2 % SOLN Pataday 0.2 % eye drops  INSTILL 1 DROP INTO AFFECTED EYE(S) BY OPHTHALMIC ROUTE ONCE DAILY PRN  . pantoprazole  (PROTONIX) 40 MG tablet Take 1 tablet (40 mg total) by mouth daily. Take 30 minutes prior to a meal  . UNITHROID 100 MCG tablet Take 1 tablet (100 mcg total) by mouth daily before breakfast.  . [DISCONTINUED] antiseptic oral rinse (BIOTENE) LIQD 15 mLs by Mouth Rinse route as needed for dry mouth or mouth pain.  . [DISCONTINUED] fluconazole (DIFLUCAN) 200 MG tablet Take 2 tablets (400 mg total) by mouth daily.  . [DISCONTINUED] insulin aspart (NOVOLOG FLEXPEN) 100 UNIT/ML FlexPen Inject 12 Units into the skin 3 (three) times daily with meals. (Patient not taking: Reported on 04/10/2018)  . [DISCONTINUED] ondansetron (ZOFRAN ODT)  4 MG disintegrating tablet Take 1 tablet (4 mg total) by mouth every 8 (eight) hours as needed for nausea or vomiting. (Patient not taking: Reported on 04/10/2018)  . [DISCONTINUED] predniSONE (DELTASONE) 10 MG tablet Take 10 mg by mouth daily with breakfast.    No facility-administered encounter medications on file as of 05/01/2018.   :  Review of Systems:  Out of a complete 14 point review of systems, all are reviewed and negative with the exception of these symptoms as listed below: Review of Systems  Neurological:       Pt presents today to discuss her sleep. Pt has never had a sleep study but does endorse snoring.  Epworth Sleepiness Scale 0= would never doze 1= slight chance of dozing 2= moderate chance of dozing 3= high chance of dozing  Sitting and reading: 2 Watching TV: 1 Sitting inactive in a public place (ex. Theater or meeting): 0 As a passenger in a car for an hour without a break: 1 Lying down to rest in the afternoon: 3 Sitting and talking to someone: 0 Sitting quietly after lunch (no alcohol): 1 In a car, while stopped in traffic: 0 Total: 8     Objective:  Neurological Exam  Physical Exam Physical Examination:   Vitals:   05/01/18 1554  BP: 122/84  Pulse: 100    General Examination: The patient is a very pleasant 52 y.o. female  in no acute distress. She appears well-developed and well-nourished and well groomed.   HEENT: Normocephalic, atraumatic, pupils are equal, round and reactive to light and accommodation. Corrective eye glasses in place. Extraocular tracking is good without limitation to gaze excursion or nystagmus noted. Normal smooth pursuit is noted. Hearing is grossly intact. Face is symmetric with normal facial animation and normal facial sensation. Speech is clear with no dysarthria noted. There is no hypophonia. There is no lip, neck/head, jaw or voice tremor. Neck is supple with full range of passive and active motion. There are no carotid bruits on auscultation. Oropharynx exam reveals: mild mouth dryness, adequate dental hygiene and marked airway crowding, due to larger tongue, smaller airway entry, thicker soft palate, redundant soft palate, tonsils are not fully visualized. Uvula is not fully visualized. Mallampati is class III. Neck circumference is 20-1/2 inches. She has a mild overbite. Tongue protrudes centrally and palate elevates symmetrically.   Chest: Clear to auscultation without wheezing, rhonchi or crackles noted.  Heart: S1+S2+0, regular and normal without murmurs, rubs or gallops noted.   Abdomen: Soft, non-tender and non-distended with normal bowel sounds appreciated on auscultation.  Extremities: There is no pitting edema in the distal lower extremities bilaterally.  Skin: Warm and dry without trophic changes noted.  Musculoskeletal: exam reveals no obvious joint deformities, tenderness or joint swelling or erythema.   Neurologically:  Mental status: The patient is awake, alert and oriented in all 4 spheres. Her immediate and remote memory, attention, language skills and fund of knowledge are appropriate. There is no evidence of aphasia, agnosia, apraxia or anomia. Speech is clear with normal prosody and enunciation. Thought process is linear. Mood is normal and affect is normal.  Cranial  nerves II - XII are as described above under HEENT exam. In addition: shoulder shrug is normal with equal shoulder height noted. Motor exam: Normal bulk, strength and tone is noted. There is no drift, tremor or rebound. Fine motor skills and coordination: grossly intact.  Cerebellar testing: No dysmetria or intention tremor on finger to nose testing. Heel to shin  is unremarkable bilaterally. There is no truncal or gait ataxia.  Sensory exam: intact to light touch.  Gait, station and balance: She stands easily. No veering to one side is noted. No leaning to one side is noted. Posture is age-appropriate and stance is narrow based. Gait shows normal stride length and normal pace. No problems turning are noted.   Assessment and Plan:  In summary, Tyffani A Coscia is a very pleasant 52 y.o.-year old female with an underlying complex medical history of hypothyroidism secondary to radioactive iodine treatment for history of Graves' disease, temporal arteritis, hypertension, reflux disease, diabetes, recurrent headaches, low back pain, palpitation, allergic rhinitis, interstitial cystitis and morbid obesity with a BMI of over 55, whose history and physical exam are highly concerning for obstructive sleep apnea (OSA). I had a long chat with the patient about my findings and the diagnosis of OSA, its prognosis and treatment options. We talked about medical treatments, surgical interventions and non-pharmacological approaches. I explained in particular the risks and ramifications of untreated moderate to severe OSA, especially with respect to developing cardiovascular disease down the Road, including congestive heart failure, difficult to treat hypertension, cardiac arrhythmias, or stroke. Even type 2 diabetes has, in part, been linked to untreated OSA. Symptoms of untreated OSA include daytime sleepiness, memory problems, mood irritability and mood disorder such as depression and anxiety, lack of energy, as well as  recurrent headaches, especially morning headaches. We talked about trying to maintain a healthy lifestyle in general, as well as the importance of weight control. I encouraged the patient to eat healthy, exercise daily and keep well hydrated, to keep a scheduled bedtime and wake time routine, to not skip any meals and eat healthy snacks in between meals. I advised the patient not to drive when feeling sleepy.  I recommended the following at this time: sleep study with potential positive airway pressure titration. (We will score hypopneas at 3%).   I explained the sleep test procedure to the patient and also outlined possible surgical and non-surgical treatment options of OSA, including the use of a custom-made dental device (which would require a referral to a specialist dentist or oral surgeon), upper airway surgical options, such as pillar implants, radiofrequency surgery, tongue base surgery, and UPPP (which would involve a referral to an ENT surgeon). Rarely, jaw surgery such as mandibular advancement may be considered.  I also explained the CPAP treatment option to the patient, who indicated that she would be willing to try CPAP if the need arises. I explained the importance of being compliant with PAP treatment, not only for insurance purposes but primarily to improve Her symptoms, and for the patient's long term health benefit, including to reduce Her cardiovascular risks. I answered all her questions today and the patient was in agreement. I would like to see her back after the sleep study is completed and encouraged her to call with any interim questions, concerns, problems or updates.   Thank you very much for allowing me to participate in the care of this nice patient. If I can be of any further assistance to you please do not hesitate to call me at [phone].  Sincerely,   Star Age, MD, PhD

## 2018-05-01 NOTE — Patient Instructions (Signed)

## 2018-05-02 ENCOUNTER — Ambulatory Visit (INDEPENDENT_AMBULATORY_CARE_PROVIDER_SITE_OTHER): Payer: BLUE CROSS/BLUE SHIELD | Admitting: Obstetrics and Gynecology

## 2018-05-02 ENCOUNTER — Other Ambulatory Visit (HOSPITAL_COMMUNITY)
Admission: RE | Admit: 2018-05-02 | Discharge: 2018-05-02 | Disposition: A | Discharge status: Home / Self Care | Payer: BLUE CROSS/BLUE SHIELD | Source: Ambulatory Visit | Attending: Obstetrics and Gynecology | Admitting: Obstetrics and Gynecology

## 2018-05-02 ENCOUNTER — Encounter: Payer: Self-pay | Admitting: Obstetrics and Gynecology

## 2018-05-02 VITALS — BP 134/82 | HR 80 | Resp 18 | Ht 65.0 in | Wt 355.4 lb

## 2018-05-02 DIAGNOSIS — D219 Benign neoplasm of connective and other soft tissue, unspecified: Secondary | ICD-10-CM | POA: Diagnosis not present

## 2018-05-02 DIAGNOSIS — Z01419 Encounter for gynecological examination (general) (routine) without abnormal findings: Secondary | ICD-10-CM | POA: Insufficient documentation

## 2018-05-02 DIAGNOSIS — N939 Abnormal uterine and vaginal bleeding, unspecified: Secondary | ICD-10-CM | POA: Diagnosis not present

## 2018-05-02 DIAGNOSIS — Z1211 Encounter for screening for malignant neoplasm of colon: Secondary | ICD-10-CM

## 2018-05-02 DIAGNOSIS — R6889 Other general symptoms and signs: Secondary | ICD-10-CM | POA: Diagnosis not present

## 2018-05-02 DIAGNOSIS — E288 Other ovarian dysfunction: Secondary | ICD-10-CM | POA: Diagnosis not present

## 2018-05-02 NOTE — Progress Notes (Signed)
52 y.o. G89P0 Married [place] American female here for annual exam.    Using Mirena IUD to treat heavy bleeding.  Hx fibroids and endometriosis.  Almost had a hysterectomy in past due to heavy bleeding.   On prednisone since April and then her period stopped. Tapered off on 10/23 so her period came on full force.  Now is bleeding from 04/22/18 and had pad and tampon change every 30 minutes at worst.  Now is not needing a pad but there is blood with wiping.   Hot flashes but was also just on prednisone.   PCP: Celso Amy, MD  Dr. Harrel Carina at [facility].  Dr. Benjiman Core endocrinologist.  Dr. Rexene Alberts neurologist.   Patient's last menstrual period was 04/22/2018 (exact date).     Period Cycle (Days): (Mirena IUD) Period Pattern: (!) Irregular     Sexually active: Yes.    The current method of family planning is BTL/IUD--Mirena 2015/2016 (for heavy cycles)    Exercising: No.   Smoker:  no  Health Maintenance: Pap: 2017 normal per patient History of abnormal Pap:  no MMG: 2016 [facility]--normal Colonoscopy: NEVER BMD:   n/a  Result  n/a TDaP:  ??2006 Gardasil:   no HIV:?with pregnancy Hep C:?with pregnancy Screening Labs:   ----     reports that she has never smoked. She has never used smokeless tobacco. She reports that she does not drink alcohol or use drugs.  Past Medical History:  Diagnosis Date  . Abnormal uterine bleeding   . Anemia   . Depression   . Diabetes mellitus without complication (HCC)    gestational  . Elevated blood-pressure reading without diagnosis of hypertension   . Endometriosis   . Fibroids   . GERD (gastroesophageal reflux disease)   . Graves disease   . Grief at loss of child   . Hypertension   . Hypothyroidism   . Interstitial cystitis   . Menorrhagia   . Oropharyngeal candidiasis 01/16/2018  . Premature ventricular contractions    a. rare PVC by monitor 12/2016.  [name] Thyroid disease   . Uterine leiomyoma      Past Surgical History:  Procedure Laterality Date  . APPENDECTOMY    . ARTERY BIOPSY Left 10/25/2017   Procedure: BIOPSY TEMPORAL ARTERY;  Surgeon: Aviva Signs, MD;  Location: [facility];  Service: General;  Laterality: Left;  . CESAREAN SECTION    . CHOLECYSTECTOMY    . HERNIA REPAIR     incisional  . TUMOR REMOVAL  fibroids    Current Outpatient Medications  Medication Sig Dispense Refill  . ALPRAZolam (XANAX) 0.5 MG tablet TAKE 1 TABLET (0.5 MG TOTAL) BY MOUTH 2 (TWO) TIMES DAILY AS NEEDED FOR ANXIETY. 30 tablet 0  . blood glucose meter kit and supplies KIT Dispense based on patient and insurance preference. Use up to twice daily. 1 each 0  . cholecalciferol (VITAMIN D) 1000 units tablet Take 1,000 Units by mouth daily.    . clotrimazole (GYNE-LOTRIMIN) 1 % vaginal cream Place 1 Applicatorful vaginally at bedtime. 45 g 0  . CONTOUR NEXT TEST test strip TEST TWICE DAILY 200 each 1  . diltiazem (CARDIZEM LA) 120 MG 24 hr tablet Take 1 tablet (120 mg total) by mouth daily. 30 tablet 5  . gabapentin (NEURONTIN) 100 MG capsule TAKE 2 CAPSULES (200 MG TOTAL) BY MOUTH AT BEDTIME. 180 capsule 2  . ibuprofen (ADVIL,MOTRIN) 400 MG tablet Take 1 tablet (400 mg total) by mouth every 6 (  six) hours as needed for fever, headache or mild pain (use first). 30 tablet 0  . Insulin Glargine (LANTUS SOLOSTAR) 100 UNIT/ML Solostar Pen Inject 30 Units into the skin daily. 15 mL 1  . Insulin Pen Needle (PEN NEEDLES 3/16") 31G X 5 MM MISC 12 Units by Does not apply route 3 (three) times daily before meals. 100 each 5  . levonorgestrel (MIRENA, 52 MG,) 20 MCG/24HR IUD Mirena 20 mcg/24 hr (5 years) intrauterine device  Take 1 device by intrauterine route.    [name] losartan (COZAAR) 50 MG tablet Take 1 tablet (50 mg total) by mouth daily. 90 tablet 3  . MICROLET LANCETS MISC USE TO TEST TWICE DAILY AS DIRECTED 100 each 2  . Olopatadine HCl (PATADAY) 0.2 % SOLN Pataday 0.2 % eye drops  INSTILL 1 DROP INTO AFFECTED  EYE(S) BY OPHTHALMIC ROUTE ONCE DAILY PRN    . pantoprazole (PROTONIX) 40 MG tablet Take 1 tablet (40 mg total) by mouth daily. Take 30 minutes prior to a meal 90 tablet 3  . UNITHROID 100 MCG tablet Take 1 tablet (100 mcg total) by mouth daily before breakfast. 45 tablet 2   No current facility-administered medications for this visit.     Family History  Problem Relation Age of Onset  . Hypertension Mother   . Cancer Mother   . Breast cancer Mother 35  . Hypertension Father   . Diabetes Father   . Cancer Father   . Ovarian cancer Maternal Aunt   . Thyroid disease Maternal Aunt        hypothyroid    Review of Systems  All other systems reviewed and are negative.   Exam:   BP 134/82 (BP Location: Right Arm, Patient Position: Sitting, Cuff Size: Large)   Pulse 80   Resp 18   Ht _0  (1.651 m)   Wt (!) 355 lb 6.4 oz (161.2 kg)   LMP 04/22/2018 (Exact Date)   BMI 59.14 kg/m     General appearance: alert, cooperative and appears stated age Head: Normocephalic, without obvious abnormality, atraumatic Neck: no adenopathy, supple, symmetrical, trachea midline and thyroid normal to inspection and palpation Lungs: clear to auscultation bilaterally Breasts: normal appearance, no masses or tenderness, No nipple retraction or dimpling, No nipple discharge or bleeding, No axillary or supraclavicular adenopathy Heart: regular rate and rhythm Abdomen: soft, non-tender; no masses, no organomegaly Extremities: extremities normal, atraumatic, no cyanosis or edema Skin: Skin color, texture, turgor normal. No rashes or lesions Lymph nodes: Cervical, supraclavicular, and axillary nodes normal. No abnormal inguinal nodes palpated Neurologic: Grossly normal  Pelvic: External genitalia:  no lesions              Urethra:  normal appearing urethra with no masses, tenderness or lesions              Bartholins and Skenes: normal                 Vagina: normal appearing vagina with normal color  and discharge, no lesions              Cervix: no lesions.  IUD strings noted.               Pap taken: Yes.   Bimanual Exam:  Uterus:  normal size, contour, position, consistency, mobility, non-tender              Adnexa: no mass, fullness, tenderness  Rectal exam: Yes.  .  Confirms.              Anus:  normal sphincter tone, no lesions  Chaperone was present for exam.  Assessment:   Well woman visit with normal exam. Status post BTL.  Loss of  1 twin at 6 months and a daughter at 59 yo.  Mirena IUD.  Hx endometriosis and fibroids.  Status post myomectomy.  Interstitial cystitis.  No prior cystoscopy.  HTN.  DM.  FH premenopausal breast cancer of mother. Status post herniorrhaphy with mesh placement.   Plan: Mammogram screening.  She will schedule at [facility]. Recommended self breast awareness. Pap and HR HPV as above. Guidelines for Calcium, Vitamin D, regular exercise program including cardiovascular and weight bearing exercise. CBC, FSH, estradiol. Return for pelvic US. IFOB. TDap recommended and declined.  Flu vaccine recommended.  Follow up annually and prn.   After visit summary provided.

## 2018-05-02 NOTE — Patient Instructions (Signed)

## 2018-05-03 LAB — CBC
Hematocrit: 34.1 % (ref 34.0–46.6)
Hemoglobin: 10.7 g/dL — ABNORMAL LOW (ref 11.1–15.9)
MCH: 26.4 pg — ABNORMAL LOW (ref 26.6–33.0)
MCHC: 31.4 g/dL — ABNORMAL LOW (ref 31.5–35.7)
MCV: 84 fL (ref 79–97)
Platelets: 419 10*3/uL (ref 150–450)
RBC: 4.05 x10E6/uL (ref 3.77–5.28)
RDW: 13.5 % (ref 12.3–15.4)
WBC: 11.5 10*3/uL — ABNORMAL HIGH (ref 3.4–10.8)

## 2018-05-03 LAB — ESTRADIOL: Estradiol: 12.4 pg/mL

## 2018-05-03 LAB — FOLLICLE STIMULATING HORMONE: FSH: 25.7 m[IU]/mL

## 2018-05-05 ENCOUNTER — Telehealth: Payer: Self-pay | Admitting: Obstetrics and Gynecology

## 2018-05-05 NOTE — Telephone Encounter (Signed)
Call placed to mobile number, the primary number on file. Message stated "the person you are calling cannot accept calls at this time" and then the call disconnected with no option to leave a voice mail message. I call then call the home number and left a voice mail message requesting a return call. Upon return call will need to convey benefits and schedule recommended ultrasound.

## 2018-05-06 LAB — CYTOLOGY - PAP
Diagnosis: NEGATIVE
HPV: NOT DETECTED

## 2018-05-06 NOTE — Telephone Encounter (Signed)
Second call placed and second message left on home number requesting a return call to scheduled an ultrasound, as the cell number continues to state, "the person you are calling cannot accept calls at this time" and then the call is immediately disconnected

## 2018-05-06 NOTE — Telephone Encounter (Addendum)
Notes recorded by Burnice Logan, RN on 05/06/2018 at 8:43 AM EST Left message to call Sharee Pimple, RN at [facility].   ------  Notes recorded by Burnice Logan, RN on 05/06/2018 at 8:41 AM EST Confirmed with lab iron and ferritin labs added to labs previously drawn. ------  Notes recorded by Burnice Logan, RN on 05/05/2018 at 3:45 PM EST Message to lab requesting to add iron and ferritin labs.      ----- Message from Nunzio Cobbs, MD sent at 05/04/2018  5:31 PM EST ----- Please inform patient of her results.  She is perimenopausal by her [place] and estradiol.  Her hemoglobin is low at 10.7, and this is essentially the same as it was one month ago.  Please ask our lab to add iron and ferritin to her labs already drawn.

## 2018-05-07 LAB — IRON: Iron: 38 ug/dL (ref 27–159)

## 2018-05-07 LAB — SPECIMEN STATUS REPORT

## 2018-05-07 LAB — FERRITIN: Ferritin: 29 ng/mL (ref 15–150)

## 2018-05-11 ENCOUNTER — Other Ambulatory Visit: Payer: Self-pay | Admitting: Internal Medicine

## 2018-05-11 LAB — SPECIMEN STATUS REPORT

## 2018-05-11 LAB — FECAL OCCULT BLOOD, IMMUNOCHEMICAL: Fecal Occult Bld: NEGATIVE

## 2018-05-13 NOTE — Telephone Encounter (Signed)
Spoke with patient, advised as seen below per Dr. Quincy Simmonds. Patient notified of all results as seen below. Patient is scheduled for PUS on 05/15/18. Patient verbalizes understanding and is agreeable.   Confirmed with [facility] [place] benefits reviewed with patient.    Encounter closed.

## 2018-05-15 ENCOUNTER — Ambulatory Visit (INDEPENDENT_AMBULATORY_CARE_PROVIDER_SITE_OTHER): Payer: BLUE CROSS/BLUE SHIELD

## 2018-05-15 ENCOUNTER — Ambulatory Visit (INDEPENDENT_AMBULATORY_CARE_PROVIDER_SITE_OTHER): Payer: BLUE CROSS/BLUE SHIELD | Admitting: Obstetrics and Gynecology

## 2018-05-15 ENCOUNTER — Other Ambulatory Visit: Payer: BLUE CROSS/BLUE SHIELD

## 2018-05-15 ENCOUNTER — Other Ambulatory Visit: Payer: Self-pay

## 2018-05-15 ENCOUNTER — Encounter: Payer: Self-pay | Admitting: Obstetrics and Gynecology

## 2018-05-15 VITALS — BP 160/84 | HR 84 | Ht 65.0 in | Wt 357.0 lb

## 2018-05-15 DIAGNOSIS — D219 Benign neoplasm of connective and other soft tissue, unspecified: Secondary | ICD-10-CM | POA: Diagnosis not present

## 2018-05-15 DIAGNOSIS — D649 Anemia, unspecified: Secondary | ICD-10-CM

## 2018-05-15 DIAGNOSIS — N83201 Unspecified ovarian cyst, right side: Secondary | ICD-10-CM

## 2018-05-15 DIAGNOSIS — Z30431 Encounter for routine checking of intrauterine contraceptive device: Secondary | ICD-10-CM

## 2018-05-15 NOTE — Progress Notes (Signed)
GYNECOLOGY  VISIT   HPI: 52 y.o.   Married  [place] American  female   G7P0 with Patient's last menstrual period was 04/22/2018 (exact date).   here for pelvic ultrasound.  Has Mirena IUD. Her bleeding is more like a constant spotting.  Not clotting or filling the toilet. Feels a pinch every now and then that makes her gasp for air.   Hgb 10.7 on 05/02/18.  Iron normal. Has been anemic for years.   EMB proliferative endometrium in 2017, Dr.Vanessa Haygood.  Urinary incontinence.   GYNECOLOGIC HISTORY: Patient's last menstrual period was 04/22/2018 (exact date). Contraception:  Tubal/Mirena Menopausal hormone therapy: no Last mammogram:  2016 [facility]--normal Last pap smear: 05-02-18 Neg:Neg HR HPV, 2017 Neg per patient        OB History    Gravida  7   Para      Term      Preterm      AB      Living  7     SAB      TAB      Ectopic      Multiple      Live Births                 Patient Active Problem List   Diagnosis Date Noted  . Anxiety 03/05/2018  . Chronic nonintractable headache 03/05/2018  . Hyperlipidemia 01/23/2018  . Elevated liver enzymes   . Oropharyngeal candidiasis 01/16/2018  . Vaginal candidiasis 01/16/2018  . Diabetes mellitus type 2, uncomplicated ([place]) 66/29/4765  . Hyperosmolar non-ketotic state in patient with type 2 diabetes mellitus ([place]) 01/16/2018  . DKA (diabetic ketoacidoses) ([place]) 01/15/2018  . Left facial pressure and pain 11/09/2017  . Thrush 11/09/2017  . Gastroesophageal reflux disease 10/05/2017  . Temporal arteritis ([place]) 10/03/2017  . Right foot pain 07/15/2017  . Grief at loss of child 11/02/2016  . H/O gestational diabetes mellitus, not currently pregnant 09/28/2015  . Hypertension 09/28/2015  . Morbid obesity ([place]) 05/17/2015  . Umbilical hernia 46/50/3546  . Chronic lower back pain 05/17/2015  . Snoring 05/17/2015  . Palpitations 05/17/2015  . ANEMIA 09/03/2007  . Hypothyroidism  following radioiodine therapy 09/02/2007  . INTERSTITIAL CYSTITIS 09/01/2007  . ALLERGIC RHINITIS 08/07/2007  . [place] DISEASE, LUMBAR 08/07/2007    Past Medical History:  Diagnosis Date  . Abnormal uterine bleeding   . Anemia   . Depression   . Diabetes mellitus without complication (HCC)    gestational  . Elevated blood-pressure reading without diagnosis of hypertension   . Endometriosis   . Fibroids   . GERD (gastroesophageal reflux disease)   . Graves disease   . Grief at loss of child   . Hypertension   . Hypothyroidism   . Interstitial cystitis   . Menorrhagia   . Oropharyngeal candidiasis 01/16/2018  . Premature ventricular contractions    a. rare PVC by monitor 12/2016.  [name] Thyroid disease   . Uterine leiomyoma     Past Surgical History:  Procedure Laterality Date  . APPENDECTOMY    . ARTERY BIOPSY Left 10/25/2017   Procedure: BIOPSY TEMPORAL ARTERY;  Surgeon: Aviva Signs, MD;  Location: [facility];  Service: General;  Laterality: Left;  . CESAREAN SECTION    . CHOLECYSTECTOMY    . HERNIA REPAIR     incisional  . TUMOR REMOVAL  fibroids    Current Outpatient Medications  Medication Sig Dispense Refill  . ALPRAZolam (XANAX) 0.5 MG tablet TAKE 1  TABLET (0.5 MG TOTAL) BY MOUTH 2 (TWO) TIMES DAILY AS NEEDED FOR ANXIETY. 30 tablet 0  . blood glucose meter kit and supplies KIT Dispense based on patient and insurance preference. Use up to twice daily. 1 each 0  . cholecalciferol (VITAMIN D) 1000 units tablet Take 1,000 Units by mouth daily.    . clotrimazole (GYNE-LOTRIMIN) 1 % vaginal cream Place 1 Applicatorful vaginally at bedtime. 45 g 0  . CONTOUR NEXT TEST test strip TEST TWICE DAILY 200 each 1  . diltiazem (CARDIZEM LA) 120 MG 24 hr tablet Take 1 tablet (120 mg total) by mouth daily. 30 tablet 5  . gabapentin (NEURONTIN) 100 MG capsule TAKE 2 CAPSULES (200 MG TOTAL) BY MOUTH AT BEDTIME. 180 capsule 2  . ibuprofen (ADVIL,MOTRIN) 400 MG tablet Take 1 tablet (400 mg  total) by mouth every 6 (six) hours as needed for fever, headache or mild pain (use first). 30 tablet 0  . Insulin Glargine (LANTUS SOLOSTAR) 100 UNIT/ML Solostar Pen Inject 30 Units into the skin daily. 15 mL 1  . Insulin Pen Needle (PEN NEEDLES 3/16") 31G X 5 MM MISC 12 Units by Does not apply route 3 (three) times daily before meals. 100 each 5  . levonorgestrel (MIRENA, 52 MG,) 20 MCG/24HR IUD Mirena 20 mcg/24 hr (5 years) intrauterine device  Take 1 device by intrauterine route.    [name] losartan (COZAAR) 50 MG tablet Take 1 tablet (50 mg total) by mouth daily. 90 tablet 3  . MICROLET LANCETS MISC USE TO TEST TWICE DAILY AS DIRECTED 100 each 2  . Olopatadine HCl (PATADAY) 0.2 % SOLN Pataday 0.2 % eye drops  INSTILL 1 DROP INTO AFFECTED EYE(S) BY OPHTHALMIC ROUTE ONCE DAILY PRN    . pantoprazole (PROTONIX) 40 MG tablet Take 1 tablet (40 mg total) by mouth daily. Take 30 minutes prior to a meal 90 tablet 3  . UNITHROID 100 MCG tablet Take 1 tablet (100 mcg total) by mouth daily before breakfast. 45 tablet 2   No current facility-administered medications for this visit.      ALLERGIES: Prozac [fluoxetine hcl] and Hydrocodone  Family History  Problem Relation Age of Onset  . Hypertension Mother   . Cancer Mother   . Breast cancer Mother 3  . Hypertension Father   . Diabetes Father   . Cancer Father   . Ovarian cancer Maternal Aunt   . Thyroid disease Maternal Aunt        hypothyroid    Social History   Socioeconomic History  . Marital status: Married    Spouse name: Not on file  . Number of children: Not on file  . Years of education: Not on file  . Highest education level: Not on file  Occupational History  . Not on file  Social Needs  . Financial resource strain: Not on file  . Food insecurity:    Worry: Not on file    Inability: Not on file  . Transportation needs:    Medical: Not on file    Non-medical: Not on file  Tobacco Use  . Smoking status: Never Smoker  .  Smokeless tobacco: Never Used  Substance and Sexual Activity  . Alcohol use: No  . Drug use: No  . Sexual activity: Yes    Birth control/protection: IUD    Comment: Mirena inserted 2015/2016  Lifestyle  . Physical activity:    Days per week: Not on file    Minutes per session: Not on file  .  Stress: Not on file  Relationships  . Social connections:    Talks on phone: Not on file    Gets together: Not on file    Attends religious service: Not on file    Active member of club or organization: Not on file    Attends meetings of clubs or organizations: Not on file    Relationship status: Not on file  . Intimate partner violence:    Fear of current or ex partner: Not on file    Emotionally abused: Not on file    Physically abused: Not on file    Forced sexual activity: Not on file  Other Topics Concern  . Not on file  Social History Narrative  . Not on file    Review of Systems  All other systems reviewed and are negative.   PHYSICAL EXAMINATION:    BP (!) 160/84   Pulse 84   Ht _0  (1.651 m)   Wt (!) 357 lb (161.9 kg)   LMP 04/22/2018 (Exact Date)   BMI 59.41 kg/m     General appearance: alert, cooperative and appears stated age  Pelvic US: 13 mm anterior fibroid.  IUD in correct position in endometrial canal. EMS 5.53 mm. Right ovary with 30 mm cyst with scattered echoes. Left ovary normal.  No free fluid.  ASSESSMENT  Chronic anemia.  Mirena IUD patient.  Right ovarian cyst.  Urinary incontinence.  PLAN  Will refer to Heme Onc for further evaluation and treatment of anemia.  Continue Mirena IUD.  I am not recommending surgical intervention at this time.  FU [place] in 6 weeks. Kegels.   An After Visit Summary was printed and given to the patient.  __15____ minutes face to face time of which over 50% was spent in counseling.

## 2018-05-15 NOTE — Patient Instructions (Signed)

## 2018-05-16 ENCOUNTER — Other Ambulatory Visit (INDEPENDENT_AMBULATORY_CARE_PROVIDER_SITE_OTHER): Payer: BLUE CROSS/BLUE SHIELD

## 2018-05-16 DIAGNOSIS — E890 Postprocedural hypothyroidism: Secondary | ICD-10-CM

## 2018-05-16 LAB — TSH: TSH: 1.34 u[IU]/mL (ref 0.35–4.50)

## 2018-05-16 LAB — T4, FREE: Free T4: 0.83 ng/dL (ref 0.60–1.60)

## 2018-05-27 ENCOUNTER — Telehealth: Payer: Self-pay | Admitting: Obstetrics and Gynecology

## 2018-05-27 NOTE — Telephone Encounter (Signed)
Call placed to patient to obtain new insurance information, and to schedule six week (last ultrasound was on 05/15/18) follow up ultrasound. Patient advises, she will get new insurance information and call back to schedule recommended follow up ultrasound.

## 2018-05-28 ENCOUNTER — Telehealth: Payer: Self-pay | Admitting: Hematology

## 2018-05-28 ENCOUNTER — Encounter: Payer: Self-pay | Admitting: Hematology

## 2018-05-28 NOTE — Telephone Encounter (Signed)
New referral received from Dr. Quincy Simmonds for chronic anemia. Pt has been cld and scheduled to see Dr. Irene Limbo on 1/2 at 1pm. Letter mailed.

## 2018-05-29 ENCOUNTER — Other Ambulatory Visit: Payer: Self-pay | Admitting: Internal Medicine

## 2018-05-30 NOTE — Telephone Encounter (Signed)
[place] Controlled Substance Database checked. Last filled on 04/22/18  Last OV 03/05/18 Next appt 06/04/18

## 2018-06-02 ENCOUNTER — Other Ambulatory Visit: Payer: Self-pay | Admitting: Internal Medicine

## 2018-06-02 NOTE — Telephone Encounter (Signed)
Copied from [place] [id number]. Topic: Quick Communication - See Telephone Encounter >> Jun 02, 2018 10:48 AM Blase Mess A wrote: CRM for notification. See Telephone encounter for: 06/02/18.Patient states that the ALPRAZolam Duanne Moron) 0.5 MG tablet [[id number]] is on back order. Patient is wanting to know what to do? Please advise

## 2018-06-02 NOTE — Telephone Encounter (Signed)
Copied from [place] [id number]. Topic: Quick Communication - Rx Refill/Question >> Jun 02, 2018 10:49 AM Blase Mess A wrote: Medication: CONTOUR NEXT TEST test strip [[id number]]  (may need a new prescription. Michela Pitcher will not be ready until the 1st of the year)   Has the patient contacted their pharmacy? Yes    (Agent: If no, request that the patient contact the pharmacy for the refill.) (Agent: If yes, when and what did the pharmacy advise?)  Preferred Pharmacy (with phone number or street name):[id number] - [place], [place] [address] [place] [address] Phone: [phone] Fax: [phone]  Agent: Please be advised that RX refills may take up to 3 business days. We ask that you follow-up with your pharmacy.

## 2018-06-03 ENCOUNTER — Other Ambulatory Visit: Payer: Self-pay | Admitting: Endocrinology

## 2018-06-03 ENCOUNTER — Telehealth: Payer: Self-pay | Admitting: Obstetrics and Gynecology

## 2018-06-03 NOTE — Progress Notes (Deleted)
Subjective:    Patient ID: Erin [name], female    DOB: 12-04-1965, 52 y.o.   MRN: [id number]  HPI The patient is here for follow up.  Hypertension: She is taking her medication daily. She is compliant with a low sodium diet.  She denies chest pain, palpitations, edema, shortness of breath and regular headaches. She is exercising regularly.  She does not monitor her blood pressure at home.    Diabetes: She is taking her medication daily as prescribed. She is compliant with a diabetic diet. She is exercising regularly. She monitors her sugars and they have been running XXX. She checks her feet daily and denies foot lesions. She is up-to-date with an ophthalmology examination.   Oropharyngeal candidiasis:  Diflucan  Chronic headaches:  She did see neurology for evaluation of OSA.    Anxiety  Hypothyroidism:  She is following with Dr Cruzita Lederer.   Medications and allergies reviewed with patient and updated if appropriate.  Patient Active Problem List   Diagnosis Date Noted  . Anxiety 03/05/2018  . Chronic nonintractable headache 03/05/2018  . Hyperlipidemia 01/23/2018  . Elevated liver enzymes   . Oropharyngeal candidiasis 01/16/2018  . Vaginal candidiasis 01/16/2018  . Diabetes mellitus type 2, uncomplicated ([place]) 25/63/8937  . Hyperosmolar non-ketotic state in patient with type 2 diabetes mellitus ([place]) 01/16/2018  . DKA (diabetic ketoacidoses) ([place]) 01/15/2018  . Left facial pressure and pain 11/09/2017  . Thrush 11/09/2017  . Gastroesophageal reflux disease 10/05/2017  . Temporal arteritis ([place]) 10/03/2017  . Right foot pain 07/15/2017  . Grief at loss of child 11/02/2016  . H/O gestational diabetes mellitus, not currently pregnant 09/28/2015  . Hypertension 09/28/2015  . Morbid obesity ([place]) 05/17/2015  . Umbilical hernia 34/28/7681  . Chronic lower back pain 05/17/2015  . Snoring 05/17/2015  . Palpitations 05/17/2015  . ANEMIA 09/03/2007  . Hypothyroidism  following radioiodine therapy 09/02/2007  . INTERSTITIAL CYSTITIS 09/01/2007  . ALLERGIC RHINITIS 08/07/2007  . [place] DISEASE, LUMBAR 08/07/2007    Current Outpatient Medications on File Prior to Visit  Medication Sig Dispense Refill  . ALPRAZolam (XANAX) 0.5 MG tablet TAKE 1 TABLET BY MOUTH TWICE A DAY AS NEEDED FOR ANXIETY 30 tablet 0  . blood glucose meter kit and supplies KIT Dispense based on patient and insurance preference. Use up to twice daily. 1 each 0  . cholecalciferol (VITAMIN D) 1000 units tablet Take 1,000 Units by mouth daily.    . clotrimazole (GYNE-LOTRIMIN) 1 % vaginal cream Place 1 Applicatorful vaginally at bedtime. 45 g 0  . CONTOUR NEXT TEST test strip TEST TWICE DAILY 200 each 1  . diltiazem (CARDIZEM LA) 120 MG 24 hr tablet Take 1 tablet (120 mg total) by mouth daily. 30 tablet 5  . gabapentin (NEURONTIN) 100 MG capsule TAKE 2 CAPSULES (200 MG TOTAL) BY MOUTH AT BEDTIME. 180 capsule 2  . ibuprofen (ADVIL,MOTRIN) 400 MG tablet Take 1 tablet (400 mg total) by mouth every 6 (six) hours as needed for fever, headache or mild pain (use first). 30 tablet 0  . Insulin Glargine (LANTUS SOLOSTAR) 100 UNIT/ML Solostar Pen Inject 30 Units into the skin daily. 15 mL 1  . Insulin Pen Needle (PEN NEEDLES 3/16") 31G X 5 MM MISC 12 Units by Does not apply route 3 (three) times daily before meals. 100 each 5  . levonorgestrel (MIRENA, 52 MG,) 20 MCG/24HR IUD Mirena 20 mcg/24 hr (5 years) intrauterine device  Take 1 device by intrauterine route.    [name]  losartan (COZAAR) 50 MG tablet Take 1 tablet (50 mg total) by mouth daily. 90 tablet 3  . MICROLET LANCETS MISC USE TO TEST TWICE DAILY AS DIRECTED 100 each 2  . Olopatadine HCl (PATADAY) 0.2 % SOLN Pataday 0.2 % eye drops  INSTILL 1 DROP INTO AFFECTED EYE(S) BY OPHTHALMIC ROUTE ONCE DAILY PRN    . pantoprazole (PROTONIX) 40 MG tablet Take 1 tablet (40 mg total) by mouth daily. Take 30 minutes prior to a meal 90 tablet 3  . SYNTHROID 125  MCG tablet TAKE 1 TABLET (125 MCG TOTAL) BY MOUTH DAILY BEFORE BREAKFAST. 90 tablet 0  . UNITHROID 100 MCG tablet Take 1 tablet (100 mcg total) by mouth daily before breakfast. 45 tablet 2   No current facility-administered medications on file prior to visit.     Past Medical History:  Diagnosis Date  . Abnormal uterine bleeding   . Anemia   . Depression   . Diabetes mellitus without complication (HCC)    gestational  . Elevated blood-pressure reading without diagnosis of hypertension   . Endometriosis   . Fibroids   . GERD (gastroesophageal reflux disease)   . Graves disease   . Grief at loss of child   . Hypertension   . Hypothyroidism   . Interstitial cystitis   . Menorrhagia   . Oropharyngeal candidiasis 01/16/2018  . Premature ventricular contractions    a. rare PVC by monitor 12/2016.  [name] Thyroid disease   . Uterine leiomyoma     Past Surgical History:  Procedure Laterality Date  . APPENDECTOMY    . ARTERY BIOPSY Left 10/25/2017   Procedure: BIOPSY TEMPORAL ARTERY;  Surgeon: Aviva Signs, MD;  Location: [facility];  Service: General;  Laterality: Left;  . CESAREAN SECTION    . CHOLECYSTECTOMY    . HERNIA REPAIR     incisional  . TUMOR REMOVAL  fibroids    Social History   Socioeconomic History  . Marital status: Married    Spouse name: Not on file  . Number of children: Not on file  . Years of education: Not on file  . Highest education level: Not on file  Occupational History  . Not on file  Social Needs  . Financial resource strain: Not on file  . Food insecurity:    Worry: Not on file    Inability: Not on file  . Transportation needs:    Medical: Not on file    Non-medical: Not on file  Tobacco Use  . Smoking status: Never Smoker  . Smokeless tobacco: Never Used  Substance and Sexual Activity  . Alcohol use: No  . Drug use: No  . Sexual activity: Yes    Birth control/protection: IUD    Comment: Mirena inserted 2015/2016  Lifestyle  . Physical  activity:    Days per week: Not on file    Minutes per session: Not on file  . Stress: Not on file  Relationships  . Social connections:    Talks on phone: Not on file    Gets together: Not on file    Attends religious service: Not on file    Active member of club or organization: Not on file    Attends meetings of clubs or organizations: Not on file    Relationship status: Not on file  Other Topics Concern  . Not on file  Social History Narrative  . Not on file    Family History  Problem Relation Age of Onset  .  Hypertension Mother   . Cancer Mother   . Breast cancer Mother 83  . Hypertension Father   . Diabetes Father   . Cancer Father   . Ovarian cancer Maternal Aunt   . Thyroid disease Maternal Aunt        hypothyroid    Review of Systems     Objective:  There were no vitals filed for this visit. BP Readings from Last 3 Encounters:  05/15/18 (!) 160/84  05/02/18 134/82  05/01/18 122/84   Wt Readings from Last 3 Encounters:  05/15/18 (!) 357 lb (161.9 kg)  05/02/18 (!) 355 lb 6.4 oz (161.2 kg)  05/01/18 (!) 356 lb (161.5 kg)   There is no height or weight on file to calculate BMI.   Physical Exam    Constitutional: Appears well-developed and well-nourished. No distress.  HENT:  Head: Normocephalic and atraumatic.  Neck: Neck supple. No tracheal deviation present. No thyromegaly present.  No cervical lymphadenopathy Cardiovascular: Normal rate, regular rhythm and normal heart sounds.   No murmur heard. No carotid bruit .  No edema Pulmonary/Chest: Effort normal and breath sounds normal. No respiratory distress. No has no wheezes. No rales.  Skin: Skin is warm and dry. Not diaphoretic.  Psychiatric: Normal mood and affect. Behavior is normal.      Assessment & Plan:    See Problem List for Assessment and Plan of chronic medical problems.

## 2018-06-03 NOTE — Telephone Encounter (Signed)
Call placed to patient to review information in regards to insurance coverage after 05/24/18. See account notes for details. Left a voicemail message requesting a return call

## 2018-06-03 NOTE — Telephone Encounter (Signed)
Spoke with pharmacy and they stated that they had 1 mg tablets. Can we send in that rx for pt to cut in half? Rx pended.

## 2018-06-04 ENCOUNTER — Ambulatory Visit: Payer: BLUE CROSS/BLUE SHIELD | Admitting: Internal Medicine

## 2018-06-04 MED ORDER — GLUCOSE BLOOD VI STRP: ORAL_STRIP | 1 refills | Status: DC

## 2018-06-04 MED ORDER — ALPRAZOLAM 1 MG PO TABS: ORAL_TABLET | ORAL | 0 refills | Status: DC

## 2018-06-04 NOTE — Telephone Encounter (Signed)
Requested medication (s) are due for refill today: Yes  Requested medication (s) are on the active medication list: Yes  Last refill:  03/10/18  Future visit scheduled: Today  Notes to clinic:  Needs new prescription.    Requested Prescriptions  Pending Prescriptions Disp Refills   glucose blood (CONTOUR NEXT TEST) test strip 200 each 1    Sig: 2 (two) times daily. use for testing     Endocrinology: Diabetes - Testing Supplies Passed - 06/03/2018 12:07 PM      Passed - Valid encounter within last 12 months    Recent Outpatient Visits          3 months ago Essential hypertension   [place], Claudina Lick, MD   3 months ago Tooth pain   [place], Marvis Repress, [place]   4 months ago Essential hypertension   [place], Claudina Lick, MD   4 months ago Type 2 diabetes mellitus without complication, without long-term current use of insulin [facility])   [place], Enid Baas, MD   5 months ago Temporal arteritis [facility])   [place], Claudina Lick, MD      Future Appointments            Today [place], Claudina Lick, MD [place], [facility]

## 2018-06-09 LAB — SPECIMEN STATUS REPORT

## 2018-06-09 LAB — FECAL OCCULT BLOOD, IMMUNOCHEMICAL

## 2018-06-11 ENCOUNTER — Ambulatory Visit (INDEPENDENT_AMBULATORY_CARE_PROVIDER_SITE_OTHER): Payer: BLUE CROSS/BLUE SHIELD | Admitting: Neurology

## 2018-06-11 DIAGNOSIS — R0689 Other abnormalities of breathing: Secondary | ICD-10-CM

## 2018-06-11 DIAGNOSIS — R519 Headache, unspecified: Secondary | ICD-10-CM

## 2018-06-11 DIAGNOSIS — G4733 Obstructive sleep apnea (adult) (pediatric): Secondary | ICD-10-CM

## 2018-06-11 DIAGNOSIS — Z6841 Body Mass Index (BMI) 40.0 and over, adult: Secondary | ICD-10-CM

## 2018-06-11 DIAGNOSIS — R002 Palpitations: Secondary | ICD-10-CM

## 2018-06-11 DIAGNOSIS — Z9189 Other specified personal risk factors, not elsewhere classified: Secondary | ICD-10-CM

## 2018-06-11 DIAGNOSIS — R0683 Snoring: Secondary | ICD-10-CM

## 2018-06-11 DIAGNOSIS — R51 Headache: Secondary | ICD-10-CM

## 2018-06-11 DIAGNOSIS — G4719 Other hypersomnia: Secondary | ICD-10-CM

## 2018-06-11 DIAGNOSIS — R0681 Apnea, not elsewhere classified: Secondary | ICD-10-CM

## 2018-06-19 ENCOUNTER — Telehealth: Payer: Self-pay

## 2018-06-19 NOTE — Telephone Encounter (Signed)
I called pt. I advised pt that Dr. Rexene Alberts reviewed their sleep study results and found that pt has severe osa. Dr. Rexene Alberts recommends that pt start an auto pap at home and complete and ONO on auto pap about 1 week after starting auto pap. I reviewed PAP compliance expectations with the pt. Pt is agreeable to starting an auto-PAP. I advised pt that an order will be sent to a DME, [facility], and [facility] will call the pt within about one week after they file with the pt's insurance. [facility] will show the pt how to use the machine, fit for masks, and troubleshoot the auto-PAP if needed. A follow up appt was made for insurance purposes with Dr. Rexene Alberts on 09/24/18 at 2:00pm. Pt verbalized understanding to arrive 15 minutes early and bring their auto-PAP. A letter with all of this information in it will be mailed to the pt as a reminder. I verified with the pt that the address we have on file is correct. Pt verbalized understanding of results. Pt had no questions at this time but was encouraged to call back if questions arise. I have sent the orders to [facility] and have received confirmation that they have received the order.

## 2018-06-19 NOTE — Progress Notes (Signed)
Patient referred by Dr. Quay Burow, seen by me on 05/01/18, HST on 06/11/18.    Please call and notify the patient that the recent home sleep test showed obstructive sleep apnea in the severe range. While I recommend treatment for this in the form CPAP, her insurance will not approve a sleep study for this. They will likely only approve a trial of autoPAP, which means, that we don't have to bring her in for a sleep study with CPAP, but will let her try an autoPAP machine at home, through a DME company (of her choice, or as per insurance requirement). The DME representative will educate her on how to use the machine, how to put the mask on, etc. I have placed an order in the chart. Please send referral, talk to patient, send report to referring MD. We will need a FU in sleep clinic for 10 weeks post-PAP set up, please arrange that with me or one of our NPs.  I will also ask for an ONO about 1 week post-autoPAP set up. Please remind pt of this as well.   Star Age, MD, PhD [facility])

## 2018-06-19 NOTE — Procedures (Signed)
[facility] @[facility] [address], [place] [address] NAME:  Erin [name]                                                                 DOB: 1965-08-25 MEDICAL RECORD no: [id number]                                              DOS: 06/11/2018  REFERRING PHYSICIAN: Billey Gosling, MD STUDY PERFORMED: Home Sleep Test on [facility] HISTORY: 52 year old woman with a history of hypothyroidism secondary to radioactive iodine treatment for history of Graves' disease, temporal arteritis, hypertension, reflux disease, diabetes, recurrent headaches, low back pain, palpitation, allergic rhinitis, interstitial cystitis and morbid obesity, who reports snoring and excessive daytime somnolence. Her Epworth sleepiness score is 8 out of 24, BMI of 59.5.  STUDY RESULTS:   Total Recording Time: 8 hrs, 40 mins; Total Sleep Time:  7 hrs, 11 mins Total Apnea/Hypopnea Index (AHI): 57.3/h; RDI: 57.3/h; REM AHI: 72.5/h Average Oxygen Saturation:  95 %         Lowest Oxygen Desaturation: 73%  Total Time Oxygen Saturation Below or at 88 %:  7.0 minutes  Average Heart Rate:   82 bpm (between 47 and 105 bpm) IMPRESSION: OSA RECOMMENDATION: This home sleep test demonstrates severe obstructive sleep apnea with a total AHI of 57.3/hour and O2 nadir of 73%. Given the patient's medical history and sleep related complaints, treatment with positive airway pressure (in the form of CPAP) is recommended. This will require a full night CPAP titration study for proper treatment settings, O2 monitoring and mask fitting. Based on the severity of the sleep disordered breathing an attended titration study is indicated. However, patient's insurance has denied an attended sleep study; therefore, the patient will be advised to proceed with an autoPAP titration/trial at home for now. I will also request an overnight pulse oximetry test to review O2 saturations, once patient is on autoPAP at home. Please note, that  untreated obstructive sleep apnea may carry additional perioperative morbidity. Patients with significant obstructive sleep apnea should receive perioperative PAP therapy and the surgeons and particularly the anesthesiologist should be informed of the diagnosis and the severity of the sleep disordered breathing. The patient should be cautioned not to drive, work at heights, or operate dangerous or heavy equipment when tired or sleepy. Review and reiteration of good sleep hygiene measures should be pursued with any patient. Other causes of the patient's symptoms, including circadian rhythm disturbances, an underlying mood disorder, medication effect and/or an underlying medical problem cannot be ruled out based on this test. Clinical correlation is recommended. The patient and her referring provider will be notified of the test results. The patient will be seen in follow up in sleep clinic at [facility]. I certify that I have reviewed the raw data recording prior to the issuance of this report in accordance with the standards of the American Academy of Sleep Medicine (AASM).  Star Age, MD, PhD [facility]) Diplomat, ABPN (Neurology and Sleep)

## 2018-06-19 NOTE — Addendum Note (Signed)
Addended by: Star Age on: 06/19/2018 01:46 PM   Modules accepted: Orders

## 2018-06-19 NOTE — Telephone Encounter (Signed)
-----   Message from Star Age, MD sent at 06/19/2018  1:45 PM EST ----- Patient referred by Dr. Quay Burow, seen by me on 05/01/18, HST on 06/11/18.    Please call and notify the patient that the recent home sleep test showed obstructive sleep apnea in the severe range. While I recommend treatment for this in the form CPAP, her insurance will not approve a sleep study for this. They will likely only approve a trial of autoPAP, which means, that we don't have to bring her in for a sleep study with CPAP, but will let her try an autoPAP machine at home, through a DME company (of her choice, or as per insurance requirement). The DME representative will educate her on how to use the machine, how to put the mask on, etc. I have placed an order in the chart. Please send referral, talk to patient, send report to referring MD. We will need a FU in sleep clinic for 10 weeks post-PAP set up, please arrange that with me or one of our NPs.  I will also ask for an ONO about 1 week post-autoPAP set up. Please remind pt of this as well.   Star Age, MD, PhD [facility])

## 2018-06-23 ENCOUNTER — Encounter: Payer: Self-pay | Admitting: Internal Medicine

## 2018-06-23 DIAGNOSIS — G4733 Obstructive sleep apnea (adult) (pediatric): Secondary | ICD-10-CM

## 2018-06-23 HISTORY — DX: Obstructive sleep apnea (adult) (pediatric): G47.33

## 2018-06-23 NOTE — Telephone Encounter (Signed)
Call placed to follow up in regards to benefits with new insurance and to scheduled follow up ultrasound. Left voicemail message requesting a return call

## 2018-06-26 ENCOUNTER — Encounter: Payer: BLUE CROSS/BLUE SHIELD | Admitting: Hematology

## 2018-06-27 ENCOUNTER — Telehealth: Payer: Self-pay | Admitting: Hematology

## 2018-06-27 NOTE — Telephone Encounter (Signed)
Pt will cb to reschedule appt

## 2018-07-03 ENCOUNTER — Telehealth: Payer: Self-pay | Admitting: Obstetrics and Gynecology

## 2018-07-03 NOTE — Telephone Encounter (Signed)
New phone note opened in error

## 2018-07-03 NOTE — Telephone Encounter (Signed)
See previous phone note. Spoke with patient regarding benefits for follow up ultrasound.  Patient understood information presented, but declined to schedule. Patient was not aware of benefits with new coverage, adds she will need to discuss with her spouse.   Forwarding to Dr Quincy Simmonds  cc: Lamont Snowball, RN  cc: Thayer Ohm

## 2018-07-22 NOTE — Telephone Encounter (Signed)
Follow-up call to patient regarding pelvic ultrasound.  Patient declines to schedule due to [facility] cost. Payment options and alternative locations offered. Patient declines all options. States she does not want to proceed at this time bu will call back as soon as she is ready to schedule.  Stressed importance of follow-up exam.    States it " is at the top of her list."    Order deferred 2 weeks. Routing to Dr Quincy Simmonds for review.

## 2018-07-22 NOTE — Telephone Encounter (Signed)
Patient may have pelvic US at [place] or [facility] and then follow up here.

## 2018-07-23 ENCOUNTER — Encounter: Payer: Self-pay | Admitting: Obstetrics and Gynecology

## 2018-07-23 DIAGNOSIS — N83201 Unspecified ovarian cyst, right side: Secondary | ICD-10-CM | POA: Insufficient documentation

## 2018-07-23 NOTE — Telephone Encounter (Signed)
I added her right ovarian cyst to her problem list on her chart.

## 2018-08-11 ENCOUNTER — Telehealth: Payer: Self-pay | Admitting: Neurology

## 2018-08-11 NOTE — Telephone Encounter (Signed)
Pt called to inform us that she was not able to get her CPAP due to Insurance reasons.

## 2018-08-11 NOTE — Telephone Encounter (Signed)
I have reached out to [facility] for further clarification on this.

## 2018-08-11 NOTE — Telephone Encounter (Signed)
Received this notice from [facility]: "It looks like it's more of a monetary issue. Insurance does cover CPAP but the pt hasn't met her deductible yet so she would be responsible for the full amount."

## 2018-08-11 NOTE — Telephone Encounter (Signed)
I called pt to discuss, no answer, left a message asking her to call me back. 

## 2018-08-13 ENCOUNTER — Telehealth: Payer: Self-pay | Admitting: Obstetrics and Gynecology

## 2018-08-13 NOTE — Telephone Encounter (Signed)
Call placed to patient to follow up and scheduled recommended ultrasound.  I first called the cell number on file and recevied the message "cannot accept calls at this time."  I also called the home number on file and left a voicemail message requesting patient to return call to [place] or [facility]   cc: Lamont Snowball, RN

## 2018-08-13 NOTE — Telephone Encounter (Signed)
I called pt again to discuss. Number is not available. Will try again later.

## 2018-08-14 ENCOUNTER — Other Ambulatory Visit: Payer: Self-pay | Admitting: Internal Medicine

## 2018-08-15 ENCOUNTER — Ambulatory Visit: Payer: BLUE CROSS/BLUE SHIELD | Admitting: Internal Medicine

## 2018-08-16 ENCOUNTER — Other Ambulatory Visit: Payer: Self-pay

## 2018-08-16 ENCOUNTER — Encounter (HOSPITAL_COMMUNITY): Payer: Self-pay | Admitting: Emergency Medicine

## 2018-08-16 ENCOUNTER — Emergency Department (HOSPITAL_COMMUNITY)
Admission: EM | Admit: 2018-08-16 | Discharge: 2018-08-16 | Disposition: A | Discharge status: Home / Self Care | Payer: BLUE CROSS/BLUE SHIELD | Attending: Emergency Medicine | Admitting: Emergency Medicine

## 2018-08-16 ENCOUNTER — Emergency Department (HOSPITAL_COMMUNITY): Payer: BLUE CROSS/BLUE SHIELD

## 2018-08-16 DIAGNOSIS — F419 Anxiety disorder, unspecified: Secondary | ICD-10-CM | POA: Insufficient documentation

## 2018-08-16 DIAGNOSIS — I10 Essential (primary) hypertension: Secondary | ICD-10-CM | POA: Insufficient documentation

## 2018-08-16 DIAGNOSIS — Z79899 Other long term (current) drug therapy: Secondary | ICD-10-CM | POA: Insufficient documentation

## 2018-08-16 DIAGNOSIS — J111 Influenza due to unidentified influenza virus with other respiratory manifestations: Secondary | ICD-10-CM

## 2018-08-16 DIAGNOSIS — R05 Cough: Secondary | ICD-10-CM | POA: Diagnosis not present

## 2018-08-16 DIAGNOSIS — F329 Major depressive disorder, single episode, unspecified: Secondary | ICD-10-CM | POA: Diagnosis not present

## 2018-08-16 DIAGNOSIS — E860 Dehydration: Secondary | ICD-10-CM | POA: Diagnosis not present

## 2018-08-16 DIAGNOSIS — E119 Type 2 diabetes mellitus without complications: Secondary | ICD-10-CM | POA: Diagnosis not present

## 2018-08-16 DIAGNOSIS — Z9049 Acquired absence of other specified parts of digestive tract: Secondary | ICD-10-CM | POA: Insufficient documentation

## 2018-08-16 DIAGNOSIS — E039 Hypothyroidism, unspecified: Secondary | ICD-10-CM | POA: Diagnosis not present

## 2018-08-16 DIAGNOSIS — R079 Chest pain, unspecified: Secondary | ICD-10-CM | POA: Diagnosis not present

## 2018-08-16 DIAGNOSIS — R69 Illness, unspecified: Secondary | ICD-10-CM

## 2018-08-16 LAB — CBC
HCT: 35.8 % — ABNORMAL LOW (ref 36.0–46.0)
Hemoglobin: 11 g/dL — ABNORMAL LOW (ref 12.0–15.0)
MCH: 25.1 pg — ABNORMAL LOW (ref 26.0–34.0)
MCHC: 30.7 g/dL (ref 30.0–36.0)
MCV: 81.5 fL (ref 80.0–100.0)
Platelets: 314 10*3/uL (ref 150–400)
RBC: 4.39 MIL/uL (ref 3.87–5.11)
RDW: 16.1 % — ABNORMAL HIGH (ref 11.5–15.5)
WBC: 10.2 10*3/uL (ref 4.0–10.5)
nRBC: 0 % (ref 0.0–0.2)

## 2018-08-16 LAB — COMPREHENSIVE METABOLIC PANEL
ALT: 51 U/L — ABNORMAL HIGH (ref 0–44)
AST: 43 U/L — ABNORMAL HIGH (ref 15–41)
Albumin: 4 g/dL (ref 3.5–5.0)
Alkaline Phosphatase: 172 U/L — ABNORMAL HIGH (ref 38–126)
Anion gap: 8 (ref 5–15)
BUN: 12 mg/dL (ref 6–20)
CO2: 25 mmol/L (ref 22–32)
Calcium: 8.9 mg/dL (ref 8.9–10.3)
Chloride: 106 mmol/L (ref 98–111)
Creatinine, Ser: 0.72 mg/dL (ref 0.44–1.00)
GFR calc Af Amer: >60 mL/min (ref >60)
GFR calc non Af Amer: >60 mL/min (ref >60)
Glucose, Bld: 108 mg/dL — ABNORMAL HIGH (ref 70–99)
Potassium: 3.4 mmol/L — ABNORMAL LOW (ref 3.5–5.1)
Sodium: 139 mmol/L (ref 135–145)
Total Bilirubin: 0.8 mg/dL (ref 0.3–1.2)
Total Protein: 7.8 g/dL (ref 6.5–8.1)

## 2018-08-16 LAB — TROPONIN I: Troponin I: <0.03 ng/mL (ref <0.03)

## 2018-08-16 MED ORDER — SODIUM CHLORIDE 0.9 % IV BOLUS
1000.0000 mL | Freq: Once | INTRAVENOUS | Status: AC
  Administered 2018-08-16: 1000 mL via INTRAVENOUS

## 2018-08-16 MED ORDER — ONDANSETRON HCL 4 MG/2ML IJ SOLN
4.0000 mg | Freq: Once | INTRAMUSCULAR | Status: AC
  Administered 2018-08-16: 4 mg via INTRAVENOUS
  Filled 2018-08-16: qty 2

## 2018-08-16 MED ORDER — OSELTAMIVIR PHOSPHATE 75 MG PO CAPS: 75.0000 mg | ORAL_CAPSULE | Freq: Two times a day (BID) | ORAL | 0 refills | Status: DC

## 2018-08-16 MED ORDER — ACETAMINOPHEN 325 MG PO TABS
650.0000 mg | ORAL_TABLET | Freq: Once | ORAL | Status: AC
  Administered 2018-08-16: 650 mg via ORAL
  Filled 2018-08-16: qty 2

## 2018-08-16 MED ORDER — ONDANSETRON 8 MG PO TBDP: 8.0000 mg | ORAL_TABLET | Freq: Three times a day (TID) | ORAL | 0 refills | Status: DC | PRN

## 2018-08-16 MED ORDER — OSELTAMIVIR PHOSPHATE 75 MG PO CAPS
75.0000 mg | ORAL_CAPSULE | Freq: Once | ORAL | Status: AC
  Administered 2018-08-16: 75 mg via ORAL
  Filled 2018-08-16: qty 1

## 2018-08-16 MED ORDER — MORPHINE SULFATE (PF) 4 MG/ML IV SOLN
4.0000 mg | Freq: Once | INTRAVENOUS | Status: AC
  Administered 2018-08-16: 4 mg via INTRAVENOUS
  Filled 2018-08-16: qty 1

## 2018-08-16 NOTE — ED Notes (Signed)
Pt temp was 102.6. Nurse aware.

## 2018-08-16 NOTE — ED Provider Notes (Signed)
[facility] DEPT Provider Note   CSN: [id number] Arrival date & time: 08/16/18  1322    History   Chief Complaint Chief Complaint  Patient presents with  . Emesis  . Fatigue    HPI Erin [name] is a 53 y.o. female.     HPI Patient is a 53 year old female presents to the emergency department with complaints of cough congestion myalgias nausea vomiting and arrived to the emergency department with a fever of 102.1.  Her husband was diagnosed with flu positive yesterday.  Her symptoms have been present for 48 hours.  She feels weak.  No diarrhea.  No blood in her vomit.  Mild transient chest discomfort today which has now resolved.  No other complaints.   Past Medical History:  Diagnosis Date  . Abnormal uterine bleeding   . Anemia   . Depression   . Diabetes mellitus without complication (HCC)    gestational  . Elevated blood-pressure reading without diagnosis of hypertension   . Endometriosis   . Fibroids   . GERD (gastroesophageal reflux disease)   . Graves disease   . Grief at loss of child   . Hypertension   . Hypothyroidism   . Interstitial cystitis   . Menorrhagia   . Oropharyngeal candidiasis 01/16/2018  . OSA (obstructive sleep apnea), severe 06/23/2018  . Premature ventricular contractions    a. rare PVC by monitor 12/2016.  . Right ovarian cyst   . Thyroid disease   . Uterine leiomyoma     Patient Active Problem List   Diagnosis Date Noted  . Right ovarian cyst   . OSA (obstructive sleep apnea), severe 06/23/2018  . Anxiety 03/05/2018  . Chronic nonintractable headache 03/05/2018  . Hyperlipidemia 01/23/2018  . Elevated liver enzymes   . Oropharyngeal candidiasis 01/16/2018  . Vaginal candidiasis 01/16/2018  . Diabetes mellitus type 2, uncomplicated ([place]) 50/56/9794  . Hyperosmolar non-ketotic state in patient with type 2 diabetes mellitus ([place]) 01/16/2018  . DKA (diabetic ketoacidoses) ([place]) 01/15/2018  . Left  facial pressure and pain 11/09/2017  . Thrush 11/09/2017  . Gastroesophageal reflux disease 10/05/2017  . Temporal arteritis ([place]) 10/03/2017  . Right foot pain 07/15/2017  . Grief at loss of child 11/02/2016  . H/O gestational diabetes mellitus, not currently pregnant 09/28/2015  . Hypertension 09/28/2015  . Morbid obesity ([place]) 05/17/2015  . Umbilical hernia 80/16/5537  . Chronic lower back pain 05/17/2015  . Snoring 05/17/2015  . Palpitations 05/17/2015  . ANEMIA 09/03/2007  . Hypothyroidism following radioiodine therapy 09/02/2007  . INTERSTITIAL CYSTITIS 09/01/2007  . ALLERGIC RHINITIS 08/07/2007  . [place] DISEASE, LUMBAR 08/07/2007    Past Surgical History:  Procedure Laterality Date  . APPENDECTOMY    . ARTERY BIOPSY Left 10/25/2017   Procedure: BIOPSY TEMPORAL ARTERY;  Surgeon: Aviva Signs, MD;  Location: [facility] ORS;  Service: General;  Laterality: Left;  . CESAREAN SECTION    . CHOLECYSTECTOMY    . HERNIA REPAIR     incisional  . TUMOR REMOVAL  fibroids     OB History    Gravida  7   Para      Term      Preterm      AB      Living  7     SAB      TAB      Ectopic      Multiple      Live Births  Home Medications    Prior to Admission medications   Medication Sig Start Date End Date Taking? Authorizing Provider  cholecalciferol (VITAMIN D) 1000 units tablet Take 1,000 Units by mouth daily.   Yes [provider]  diltiazem (CARDIZEM LA) 120 MG 24 hr tablet Take 1 tablet (120 mg total) by mouth daily. Overdue for appt must see provider for future refills 08/14/18  Yes Burns, Claudina Lick, MD  levonorgestrel (MIRENA, 52 MG,) 20 MCG/24HR IUD 1 each by Intrauterine route once.  06/26/15  Yes [provider]  losartan (COZAAR) 50 MG tablet Take 1 tablet (50 mg total) by mouth daily. 04/23/18  Yes Burns, Claudina Lick, MD  Multiple Vitamin (MULTIVITAMIN) tablet Take 1 tablet by mouth daily.   Yes [provider]    Pseudoephedrine-Ibuprofen 30-200 MG TABS Take 2 tablets by mouth every 6 (six) hours as needed (cough, headache, congestion and fever).   Yes [provider]  UNITHROID 100 MCG tablet Take 1 tablet (100 mcg total) by mouth daily before breakfast. 04/10/18  Yes Philemon Kingdom, MD  White Petrolatum-Mineral Oil (ARTIFICIAL TEARS) ointment Place 1 drop into both eyes as needed (dry eye).   Yes [provider]  ALPRAZolam (XANAX) 0.5 MG tablet TAKE 1 TABLET BY MOUTH TWICE A DAY AS NEEDED FOR ANXIETY Patient not taking: Reported on 08/16/2018 05/31/18   Binnie Rail, MD  ALPRAZolam Duanne Moron) 1 MG tablet Take 1/2 tablet (0.5 mg total) 2 times daily. Patient not taking: Reported on 08/16/2018 06/04/18   Binnie Rail, MD  blood glucose meter kit and supplies KIT Dispense based on patient and insurance preference. Use up to twice daily. 12/18/17   Binnie Rail, MD  clotrimazole (GYNE-LOTRIMIN) 1 % vaginal cream Place 1 Applicatorful vaginally at bedtime. Patient not taking: Reported on 08/16/2018 01/20/18   Eugenie Filler, MD  gabapentin (NEURONTIN) 100 MG capsule TAKE 2 CAPSULES (200 MG TOTAL) BY MOUTH AT BEDTIME. Patient not taking: Reported on 08/16/2018 03/27/18   Binnie Rail, MD  glucose blood (CONTOUR NEXT TEST) test strip TEST TWICE DAILY 06/04/18   Binnie Rail, MD  ibuprofen (ADVIL,MOTRIN) 400 MG tablet Take 1 tablet (400 mg total) by mouth every 6 (six) hours as needed for fever, headache or mild pain (use first). Patient not taking: Reported on 08/16/2018 01/20/18   Eugenie Filler, MD  Insulin Glargine (LANTUS SOLOSTAR) 100 UNIT/ML Solostar Pen Inject 30 Units into the skin daily. Patient not taking: Reported on 08/16/2018 03/05/18   Binnie Rail, MD  Insulin Pen Needle (PEN NEEDLES 3/16") 31G X 5 MM MISC 12 Units by Does not apply route 3 (three) times daily before meals. 02/14/18   Binnie Rail, MD  MICROLET LANCETS MISC USE TO TEST TWICE DAILY AS DIRECTED 03/25/18    Binnie Rail, MD  Olopatadine HCl (PATADAY) 0.2 % SOLN Place 1 drop into both eyes daily as needed (itching).     [provider]  pantoprazole (PROTONIX) 40 MG tablet Take 1 tablet (40 mg total) by mouth daily. Take 30 minutes prior to a meal Patient not taking: Reported on 08/16/2018 10/04/17   Binnie Rail, MD  SYNTHROID 125 MCG tablet TAKE 1 TABLET (125 MCG TOTAL) BY MOUTH DAILY BEFORE BREAKFAST. Patient not taking: Reported on 08/16/2018 06/03/18   Philemon Kingdom, MD    Family History Family History  Problem Relation Age of Onset  . Hypertension Mother   . Cancer Mother   . Breast  cancer Mother 16  . Hypertension Father   . Diabetes Father   . Cancer Father   . Ovarian cancer Maternal Aunt   . Thyroid disease Maternal Aunt        hypothyroid    Social History Social History   Tobacco Use  . Smoking status: Never Smoker  . Smokeless tobacco: Never Used  Substance Use Topics  . Alcohol use: No  . Drug use: No     Allergies   Prednisone; Prozac [fluoxetine hcl]; and Hydrocodone   Review of Systems Review of Systems  All other systems reviewed and are negative.    Physical Exam Updated Vital Signs BP 133/79   Pulse (!) 113   Temp (!) 100.6 F (38.1 C) (Oral)   Resp (!) 22   LMP 08/02/2018   SpO2 95%   Physical Exam Vitals signs and nursing note reviewed.  Constitutional:      General: She is not in acute distress.    Appearance: She is well-developed.  HENT:     Head: Normocephalic and atraumatic.  Neck:     Musculoskeletal: Normal range of motion.  Cardiovascular:     Rate and Rhythm: Normal rate and regular rhythm.     Heart sounds: Normal heart sounds.  Pulmonary:     Effort: Pulmonary effort is normal.     Breath sounds: Normal breath sounds.  Abdominal:     General: There is no distension.     Palpations: Abdomen is soft.     Tenderness: There is no abdominal tenderness.  Musculoskeletal: Normal range of motion.  Skin:     General: Skin is warm and dry.  Neurological:     Mental Status: She is alert and oriented to person, place, and time.  Psychiatric:        Judgment: Judgment normal.      ED Treatments / Results  Labs (all labs ordered are listed, but only abnormal results are displayed) Labs Reviewed  CBC - Abnormal; Notable for the following components:      Result Value   Hemoglobin 11.0 (*)    HCT 35.8 (*)    MCH 25.1 (*)    RDW 16.1 (*)    All other components within normal limits  COMPREHENSIVE METABOLIC PANEL  TROPONIN I    EKG EKG Interpretation  Date/Time:  Saturday August 16 2018 14:57:16 EST Ventricular Rate:  116 PR Interval:    QRS Duration: 86 QT Interval:  339 QTC Calculation: 469 R Axis:   -18 Text Interpretation:  Sinus tachycardia Multiple ventricular premature complexes Borderline left axis deviation Low voltage, precordial leads Consider anterior infarct No significant change was found Confirmed by Jola Schmidt [phone]) on 08/16/2018 4:24:42 PM   Radiology Dg Chest 2 View  Result Date: 08/16/2018 CLINICAL DATA:  Cough and chest pain. EXAM: CHEST - 2 VIEW COMPARISON:  Chest x-ray dated January 15, 2018. FINDINGS: Mild cardiomegaly. Normal pulmonary vascularity. No focal consolidation, pleural effusion, or pneumothorax. No acute osseous abnormality. IMPRESSION: No active cardiopulmonary disease. Electronically Signed   By: Titus Dubin M.D.   On: 08/16/2018 15:36    Procedures Procedures (including critical care time)  Medications Ordered in ED Medications  sodium chloride 0.9 % bolus 1,000 mL (1,000 mLs Intravenous New Bag/Given 08/16/18 1539)  oseltamivir (TAMIFLU) capsule 75 mg (75 mg Oral Given 08/16/18 1539)  ondansetron (ZOFRAN) injection 4 mg (4 mg Intravenous Given 08/16/18 1539)     Initial Impression / Assessment and Plan / ED Course  I have reviewed the triage vital signs and the nursing notes.  Pertinent labs & imaging results that were available  during my care of the patient were reviewed by me and considered in my medical decision making (see chart for details).        Suspect influenza.  Doubt ACS.  Doubt PE.  EKG without ischemic changes.  Tamiflu given given her co morbidities.  Additional fluids given.  Fever will be treated.  Plan for discharge home after symptomatic management here in the emergency department.  Final Clinical Impressions(s) / ED Diagnoses   Final diagnoses:  None    ED Discharge Orders    None       Jola Schmidt, MD 08/16/18 [phone]

## 2018-08-16 NOTE — ED Triage Notes (Signed)
Pt reports husband was positive for the flu yesterday. Reports he coughed in her face 2 days ago. Pt reports congestion, body aches, fevers, vomiting.

## 2018-08-16 NOTE — ED Notes (Signed)
Patient using restroom.

## 2018-08-16 NOTE — ED Provider Notes (Signed)
Pt signed out by Dr. Venora Maples pending labs.  She has a mild elevation of her LFTs, but this appears chronic.  HR and temp are coming down.  Pt is getting a 2nd L of NS and is feeling better.  Pt will be d/c after the 2nd L is in.  Return if worse.   Isla Pence, MD 08/16/18 [phone]

## 2018-08-19 ENCOUNTER — Other Ambulatory Visit: Payer: Self-pay | Admitting: Internal Medicine

## 2018-08-24 ENCOUNTER — Encounter (HOSPITAL_COMMUNITY): Payer: Self-pay | Admitting: Emergency Medicine

## 2018-08-24 ENCOUNTER — Emergency Department (HOSPITAL_COMMUNITY)
Admission: EM | Admit: 2018-08-24 | Discharge: 2018-08-24 | Disposition: A | Discharge status: Home / Self Care | Payer: BLUE CROSS/BLUE SHIELD | Attending: Emergency Medicine | Admitting: Emergency Medicine

## 2018-08-24 ENCOUNTER — Other Ambulatory Visit: Payer: Self-pay

## 2018-08-24 DIAGNOSIS — E039 Hypothyroidism, unspecified: Secondary | ICD-10-CM | POA: Insufficient documentation

## 2018-08-24 DIAGNOSIS — G933 Postviral fatigue syndrome: Secondary | ICD-10-CM | POA: Diagnosis not present

## 2018-08-24 DIAGNOSIS — Z79899 Other long term (current) drug therapy: Secondary | ICD-10-CM | POA: Insufficient documentation

## 2018-08-24 DIAGNOSIS — E860 Dehydration: Secondary | ICD-10-CM | POA: Diagnosis not present

## 2018-08-24 DIAGNOSIS — Z794 Long term (current) use of insulin: Secondary | ICD-10-CM | POA: Diagnosis not present

## 2018-08-24 DIAGNOSIS — I10 Essential (primary) hypertension: Secondary | ICD-10-CM | POA: Diagnosis not present

## 2018-08-24 DIAGNOSIS — E119 Type 2 diabetes mellitus without complications: Secondary | ICD-10-CM | POA: Diagnosis not present

## 2018-08-24 DIAGNOSIS — R42 Dizziness and giddiness: Secondary | ICD-10-CM | POA: Diagnosis not present

## 2018-08-24 DIAGNOSIS — G9331 Postviral fatigue syndrome: Secondary | ICD-10-CM

## 2018-08-24 LAB — COMPREHENSIVE METABOLIC PANEL
ALT: 21 U/L (ref 0–44)
AST: 18 U/L (ref 15–41)
Albumin: 3.7 g/dL (ref 3.5–5.0)
Alkaline Phosphatase: 130 U/L — ABNORMAL HIGH (ref 38–126)
Anion gap: 8 (ref 5–15)
BUN: 22 mg/dL — ABNORMAL HIGH (ref 6–20)
CO2: 25 mmol/L (ref 22–32)
Calcium: 9.1 mg/dL (ref 8.9–10.3)
Chloride: 108 mmol/L (ref 98–111)
Creatinine, Ser: 0.81 mg/dL (ref 0.44–1.00)
GFR calc Af Amer: >60 mL/min (ref >60)
GFR calc non Af Amer: >60 mL/min (ref >60)
Glucose, Bld: 130 mg/dL — ABNORMAL HIGH (ref 70–99)
Potassium: 3.3 mmol/L — ABNORMAL LOW (ref 3.5–5.1)
Sodium: 141 mmol/L (ref 135–145)
Total Bilirubin: 0.5 mg/dL (ref 0.3–1.2)
Total Protein: 7.3 g/dL (ref 6.5–8.1)

## 2018-08-24 LAB — CBC
HCT: 35.9 % — ABNORMAL LOW (ref 36.0–46.0)
Hemoglobin: 10.8 g/dL — ABNORMAL LOW (ref 12.0–15.0)
MCH: 24.7 pg — ABNORMAL LOW (ref 26.0–34.0)
MCHC: 30.1 g/dL (ref 30.0–36.0)
MCV: 82.2 fL (ref 80.0–100.0)
Platelets: 415 10*3/uL — ABNORMAL HIGH (ref 150–400)
RBC: 4.37 MIL/uL (ref 3.87–5.11)
RDW: 15.9 % — ABNORMAL HIGH (ref 11.5–15.5)
WBC: 12.1 10*3/uL — ABNORMAL HIGH (ref 4.0–10.5)
nRBC: 0 % (ref 0.0–0.2)

## 2018-08-24 LAB — URINALYSIS, ROUTINE W REFLEX MICROSCOPIC
Bacteria, UA: NONE SEEN
Bilirubin Urine: NEGATIVE
Glucose, UA: NEGATIVE mg/dL
Ketones, ur: NEGATIVE mg/dL
Nitrite: NEGATIVE
Protein, ur: NEGATIVE mg/dL
RBC / HPF: >50 RBC/hpf — ABNORMAL HIGH (ref 0–5)
Specific Gravity, Urine: 1.024 (ref 1.005–1.030)
pH: 6 (ref 5.0–8.0)

## 2018-08-24 LAB — LIPASE, BLOOD: Lipase: 44 U/L (ref 11–51)

## 2018-08-24 LAB — I-STAT BETA HCG BLOOD, ED (MC, WL, AP ONLY): I-stat hCG, quantitative: <5 m[IU]/mL (ref <5)

## 2018-08-24 MED ORDER — SODIUM CHLORIDE 0.9 % IV BOLUS
1000.0000 mL | Freq: Once | INTRAVENOUS | Status: AC
  Administered 2018-08-24: 1000 mL via INTRAVENOUS

## 2018-08-24 MED ORDER — SODIUM CHLORIDE 0.9% FLUSH: 3.0000 mL | Freq: Once | INTRAVENOUS | Status: DC

## 2018-08-24 MED ORDER — KETOROLAC TROMETHAMINE 30 MG/ML IJ SOLN
30.0000 mg | Freq: Once | INTRAMUSCULAR | Status: AC
  Administered 2018-08-24: 30 mg via INTRAVENOUS
  Filled 2018-08-24: qty 1

## 2018-08-24 MED ORDER — ONDANSETRON 4 MG PO TBDP: 4.0000 mg | ORAL_TABLET | Freq: Once | ORAL | Status: DC | PRN

## 2018-08-24 MED ORDER — ONDANSETRON HCL 4 MG/2ML IJ SOLN
4.0000 mg | Freq: Once | INTRAMUSCULAR | Status: AC
  Administered 2018-08-24: 4 mg via INTRAVENOUS
  Filled 2018-08-24: qty 2

## 2018-08-24 NOTE — ED Notes (Signed)
Called lab regarding delayed results on CMP, machine complications, now in process.

## 2018-08-24 NOTE — ED Provider Notes (Signed)
[facility] DEPT Provider Note   CSN: [id number] Arrival date & time: 08/24/18  9381    History   Chief Complaint Chief Complaint  Patient presents with  . Dizziness  . Fever  . Nausea    HPI Erin [name] is a 53 y.o. female.     Pt presents to the ED today with fever, dizziness, and nausea.  She was seen in the ED on 2/22 and was thought to have the flu.  She had a high fever and was tachycardic.  She was given IVFs, tamiflu, and zofran.  She has finished the tamiflu.  She said she feels completely drained.       Past Medical History:  Diagnosis Date  . Abnormal uterine bleeding   . Anemia   . Depression   . Diabetes mellitus without complication (HCC)    gestational  . Elevated blood-pressure reading without diagnosis of hypertension   . Endometriosis   . Fibroids   . GERD (gastroesophageal reflux disease)   . Graves disease   . Grief at loss of child   . Hypertension   . Hypothyroidism   . Interstitial cystitis   . Menorrhagia   . Oropharyngeal candidiasis 01/16/2018  . OSA (obstructive sleep apnea), severe 06/23/2018  . Premature ventricular contractions    a. rare PVC by monitor 12/2016.  . Right ovarian cyst   . Thyroid disease   . Uterine leiomyoma     Patient Active Problem List   Diagnosis Date Noted  . Right ovarian cyst   . OSA (obstructive sleep apnea), severe 06/23/2018  . Anxiety 03/05/2018  . Chronic nonintractable headache 03/05/2018  . Hyperlipidemia 01/23/2018  . Elevated liver enzymes   . Oropharyngeal candidiasis 01/16/2018  . Vaginal candidiasis 01/16/2018  . Diabetes mellitus type 2, uncomplicated ([place]) 01/75/1025  . Hyperosmolar non-ketotic state in patient with type 2 diabetes mellitus ([place]) 01/16/2018  . DKA (diabetic ketoacidoses) ([place]) 01/15/2018  . Left facial pressure and pain 11/09/2017  . Thrush 11/09/2017  . Gastroesophageal reflux disease 10/05/2017  . Temporal arteritis ([place])  10/03/2017  . Right foot pain 07/15/2017  . Grief at loss of child 11/02/2016  . H/O gestational diabetes mellitus, not currently pregnant 09/28/2015  . Hypertension 09/28/2015  . Morbid obesity ([place]) 05/17/2015  . Umbilical hernia 85/27/7824  . Chronic lower back pain 05/17/2015  . Snoring 05/17/2015  . Palpitations 05/17/2015  . ANEMIA 09/03/2007  . Hypothyroidism following radioiodine therapy 09/02/2007  . INTERSTITIAL CYSTITIS 09/01/2007  . ALLERGIC RHINITIS 08/07/2007  . [place] DISEASE, LUMBAR 08/07/2007    Past Surgical History:  Procedure Laterality Date  . APPENDECTOMY    . ARTERY BIOPSY Left 10/25/2017   Procedure: BIOPSY TEMPORAL ARTERY;  Surgeon: Aviva Signs, MD;  Location: [facility];  Service: General;  Laterality: Left;  . CESAREAN SECTION    . CHOLECYSTECTOMY    . HERNIA REPAIR     incisional  . TUMOR REMOVAL  fibroids     OB History    Gravida  7   Para      Term      Preterm      AB      Living  7     SAB      TAB      Ectopic      Multiple      Live Births               Home Medications    Prior  to Admission medications   Medication Sig Start Date End Date Taking? Authorizing Provider  blood glucose meter kit and supplies KIT Dispense based on patient and insurance preference. Use up to twice daily. 12/18/17  Yes Burns, Claudina Lick, MD  cholecalciferol (VITAMIN D) 1000 units tablet Take 1,000 Units by mouth daily.   Yes [provider]  diltiazem (CARDIZEM LA) 120 MG 24 hr tablet Take 1 tablet (120 mg total) by mouth daily. Overdue for appt must see provider for future refills 08/14/18  Yes Burns, Claudina Lick, MD  glucose blood (CONTOUR NEXT TEST) test strip TEST TWICE DAILY 06/04/18  Yes Burns, Claudina Lick, MD  levonorgestrel (MIRENA, 52 MG,) 20 MCG/24HR IUD 1 each by Intrauterine route once.  06/26/15  Yes [provider]  losartan (COZAAR) 50 MG tablet Take 1 tablet (50 mg total) by mouth daily. 04/23/18  Yes Burns, Claudina Lick, MD    MICROLET LANCETS MISC USE TO TEST TWICE DAILY AS DIRECTED 03/25/18  Yes Burns, Claudina Lick, MD  Multiple Vitamin (MULTIVITAMIN) tablet Take 1 tablet by mouth daily.   Yes [provider]  Olopatadine HCl (PATADAY) 0.2 % SOLN Place 1 drop into both eyes daily as needed (itching).    Yes [provider]  ondansetron (ZOFRAN ODT) 8 MG disintegrating tablet Take 1 tablet (8 mg total) by mouth every 8 (eight) hours as needed for nausea or vomiting. 08/16/18  Yes Jola Schmidt, MD  Pseudoephedrine-Ibuprofen 30-200 MG TABS Take 2 tablets by mouth every 6 (six) hours as needed (cough, headache, congestion and fever).   Yes [provider]  UNITHROID 100 MCG tablet TAKE 1 TABLET (100 MCG TOTAL) BY MOUTH DAILY BEFORE BREAKFAST. 08/19/18  Yes Philemon Kingdom, MD  White Petrolatum-Mineral Oil (ARTIFICIAL TEARS) ointment Place 1 drop into both eyes as needed (dry eye).   Yes [provider]  ALPRAZolam (XANAX) 0.5 MG tablet TAKE 1 TABLET BY MOUTH TWICE A DAY AS NEEDED FOR ANXIETY Patient not taking: Reported on 08/16/2018 05/31/18   Binnie Rail, MD  ALPRAZolam Duanne Moron) 1 MG tablet Take 1/2 tablet (0.5 mg total) 2 times daily. Patient not taking: Reported on 08/16/2018 06/04/18   Binnie Rail, MD  clotrimazole (GYNE-LOTRIMIN) 1 % vaginal cream Place 1 Applicatorful vaginally at bedtime. Patient not taking: Reported on 08/16/2018 01/20/18   Eugenie Filler, MD  gabapentin (NEURONTIN) 100 MG capsule TAKE 2 CAPSULES (200 MG TOTAL) BY MOUTH AT BEDTIME. Patient not taking: Reported on 08/24/2018 03/27/18   Binnie Rail, MD  ibuprofen (ADVIL,MOTRIN) 400 MG tablet Take 1 tablet (400 mg total) by mouth every 6 (six) hours as needed for fever, headache or mild pain (use first). Patient not taking: Reported on 08/16/2018 01/20/18   Eugenie Filler, MD  Insulin Glargine (LANTUS SOLOSTAR) 100 UNIT/ML Solostar Pen Inject 30 Units into the skin daily. Patient not taking: Reported on  08/16/2018 03/05/18   Binnie Rail, MD  Insulin Pen Needle (PEN NEEDLES 3/16") 31G X 5 MM MISC 12 Units by Does not apply route 3 (three) times daily before meals. Patient not taking: Reported on 08/24/2018 02/14/18   Binnie Rail, MD  oseltamivir (TAMIFLU) 75 MG capsule Take 1 capsule (75 mg total) by mouth every 12 (twelve) hours. Patient not taking: Reported on 08/24/2018 08/16/18   Jola Schmidt, MD  pantoprazole (PROTONIX) 40 MG tablet Take 1 tablet (40 mg total) by mouth daily. Take 30 minutes prior to a meal Patient not taking: Reported on  08/16/2018 10/04/17   Binnie Rail, MD  SYNTHROID 125 MCG tablet TAKE 1 TABLET (125 MCG TOTAL) BY MOUTH DAILY BEFORE BREAKFAST. Patient not taking: Reported on 08/16/2018 06/03/18   Philemon Kingdom, MD    Family History Family History  Problem Relation Age of Onset  . Hypertension Mother   . Cancer Mother   . Breast cancer Mother 39  . Hypertension Father   . Diabetes Father   . Cancer Father   . Ovarian cancer Maternal Aunt   . Thyroid disease Maternal Aunt        hypothyroid    Social History Social History   Tobacco Use  . Smoking status: Never Smoker  . Smokeless tobacco: Never Used  Substance Use Topics  . Alcohol use: No  . Drug use: No     Allergies   Prednisone; Prozac [fluoxetine hcl]; and Hydrocodone   Review of Systems Review of Systems  Constitutional: Positive for fatigue and fever.  Neurological: Positive for weakness.  All other systems reviewed and are negative.    Physical Exam Updated Vital Signs BP (!) 162/99 Comment: resp 16 Simultaneous filing. User may not have seen previous data.  Pulse 71 Comment: resp 16 Simultaneous filing. User may not have seen previous data.  Temp 98.9 F (37.2 C) (Oral)   Resp 18   Ht _0  (1.651 m)   Wt (!) 150.6 kg   LMP 08/02/2018   SpO2 98% Comment: resp 16 Simultaneous filing. User may not have seen previous data.  BMI 55.25 kg/m   Physical Exam Vitals signs and  nursing note reviewed.  Constitutional:      Appearance: Normal appearance.  HENT:     Head: Normocephalic and atraumatic.     Right Ear: External ear normal.     Left Ear: External ear normal.     Nose: Nose normal.     Mouth/Throat:     Mouth: Mucous membranes are dry.     Pharynx: Oropharynx is clear.  Eyes:     Extraocular Movements: Extraocular movements intact.     Conjunctiva/sclera: Conjunctivae normal.     Pupils: Pupils are equal, round, and reactive to light.  Neck:     Musculoskeletal: Normal range of motion and neck supple.  Cardiovascular:     Rate and Rhythm: Normal rate and regular rhythm.     Pulses: Normal pulses.     Heart sounds: Normal heart sounds.  Pulmonary:     Effort: Pulmonary effort is normal.     Breath sounds: Normal breath sounds.  Abdominal:     General: Abdomen is flat.  Musculoskeletal: Normal range of motion.  Skin:    General: Skin is warm.     Capillary Refill: Capillary refill takes less than 2 seconds.  Neurological:     General: No focal deficit present.     Mental Status: She is alert and oriented to person, place, and time.  Psychiatric:        Mood and Affect: Mood normal.        Behavior: Behavior normal.      ED Treatments / Results  Labs (all labs ordered are listed, but only abnormal results are displayed) Labs Reviewed  COMPREHENSIVE METABOLIC PANEL - Abnormal; Notable for the following components:      Result Value   Potassium 3.3 (*)    Glucose, Bld 130 (*)    BUN 22 (*)    Alkaline Phosphatase 130 (*)    All other components  within normal limits  CBC - Abnormal; Notable for the following components:   WBC 12.1 (*)    Hemoglobin 10.8 (*)    HCT 35.9 (*)    MCH 24.7 (*)    RDW 15.9 (*)    Platelets 415 (*)    All other components within normal limits  URINALYSIS, ROUTINE W REFLEX MICROSCOPIC - Abnormal; Notable for the following components:   APPearance HAZY (*)    Hgb urine dipstick LARGE (*)     Leukocytes,Ua TRACE (*)    RBC / HPF >50 (*)    All other components within normal limits  LIPASE, BLOOD  I-STAT BETA HCG BLOOD, ED ([facility], [facility], [facility] ONLY)    EKG None  Radiology No results found.  Procedures Procedures (including critical care time)  Medications Ordered in ED Medications  sodium chloride flush (NS) 0.9 % injection 3 mL (3 mLs Intravenous Not Given 08/24/18 0742)  sodium chloride 0.9 % bolus 1,000 mL (0 mLs Intravenous Stopped 08/24/18 0836)  ondansetron (ZOFRAN) injection 4 mg (4 mg Intravenous Given 08/24/18 0731)  ketorolac (TORADOL) 30 MG/ML injection 30 mg (30 mg Intravenous Given 08/24/18 0731)     Initial Impression / Assessment and Plan / ED Course  I have reviewed the triage vital signs and the nursing notes.  Pertinent labs & imaging results that were available during my care of the patient were reviewed by me and considered in my medical decision making (see chart for details).       Pt is feeling much better after IVFs.  She has hematuria which she's had for several years.  She said it's from her uterus.  She and her OBgyn are in the process of deciding if she needs a hysterectomy.  Pt is likely dehydrated from her high fevers with the flu.  Pt encouraged to drink lots of fluid and to return if worse.  Final Clinical Impressions(s) / ED Diagnoses   Final diagnoses:  Dehydration  Post-influenza syndrome    ED Discharge Orders    None       Isla Pence, MD 08/24/18 [phone]

## 2018-08-24 NOTE — ED Triage Notes (Signed)
Patient is complaining of fever, dizziness, and nauseated. Patient states that she had the flu a week ago.

## 2018-08-28 NOTE — Progress Notes (Signed)
Subjective:    Patient ID: Erin [name], female    DOB: 1965/06/27, 53 y.o.   MRN: [id number]  HPI The patient is here for follow up from the ED.  [place] illness: ED 08/16/18:   She went to the ED for 48 hrs of cough, congestion, myalgias, weakness, nausea, vomiting and fever.  Mild, transient chest pain that resolved.  Fever to 102.1 in ED.  Her husband was flu positive.  Flu was suspected.  She was given tamiflu zofran and IVF.  EKG was stable.  She was discharged home.    ED 08/24/18:  She had fever, dizziness and nausea.  She had a high fever and was tachycardic.  She completed the tamiflu.  She felt completely drained.  She received IVF, zofran and toradol.  She was thought to be dehydrated.    She is getting better, but she is not 100%.  She still has a little bit of a cough, but it is mostly dry.  Occasionally she may bring up some sputum.  She has some mild headaches and lightheadedness.  She denies any fever over the past 2 days.  She still has some fatigue and generalized weakness.  She is eating well and drinking plenty of fluids.   Diabetes: She is not on any insulin or medications for her sugars.  She does check them at home regularly and they have been consistently less than 130.  She is exercising some, but not regularly.  She is fairly compliant with a diabetic diet.  GERD: She is experiencing GERD on a daily basis.  She is currently not taking anything for her GERD symptoms except for Tums as needed.  Hypertension: She is taking her medication daily. She is compliant with a low sodium diet.  She denies chest pain, edema, shortness of breath and regular headaches. She is exercising some.       Medications and allergies reviewed with patient and updated if appropriate.  Patient Active Problem List   Diagnosis Date Noted  . Right ovarian cyst   . OSA (obstructive sleep apnea), severe 06/23/2018  . Anxiety 03/05/2018  . Chronic nonintractable headache 03/05/2018  .  Hyperlipidemia 01/23/2018  . Elevated liver enzymes   . Oropharyngeal candidiasis 01/16/2018  . Vaginal candidiasis 01/16/2018  . Diabetes mellitus type 2, uncomplicated ([place]) 77/93/9030  . Hyperosmolar non-ketotic state in patient with type 2 diabetes mellitus ([place]) 01/16/2018  . DKA (diabetic ketoacidoses) ([place]) 01/15/2018  . Left facial pressure and pain 11/09/2017  . Thrush 11/09/2017  . Gastroesophageal reflux disease 10/05/2017  . Temporal arteritis ([place]) 10/03/2017  . Right foot pain 07/15/2017  . Grief at loss of child 11/02/2016  . H/O gestational diabetes mellitus, not currently pregnant 09/28/2015  . Hypertension 09/28/2015  . Morbid obesity ([place]) 05/17/2015  . Umbilical hernia 03/17/3006  . Chronic lower back pain 05/17/2015  . Snoring 05/17/2015  . Palpitations 05/17/2015  . ANEMIA 09/03/2007  . Hypothyroidism following radioiodine therapy 09/02/2007  . INTERSTITIAL CYSTITIS 09/01/2007  . ALLERGIC RHINITIS 08/07/2007  . [place] DISEASE, LUMBAR 08/07/2007    Current Outpatient Medications on File Prior to Visit  Medication Sig Dispense Refill  . cholecalciferol (VITAMIN D) 1000 units tablet Take 1,000 Units by mouth daily.    . clotrimazole (GYNE-LOTRIMIN) 1 % vaginal cream Place 1 Applicatorful vaginally at bedtime. 45 g 0  . diltiazem (CARDIZEM LA) 120 MG 24 hr tablet Take 1 tablet (120 mg total) by mouth daily. Overdue for appt must see  provider for future refills 30 tablet 0  . gabapentin (NEURONTIN) 100 MG capsule TAKE 2 CAPSULES (200 MG TOTAL) BY MOUTH AT BEDTIME. 180 capsule 2  . ibuprofen (ADVIL,MOTRIN) 400 MG tablet Take 1 tablet (400 mg total) by mouth every 6 (six) hours as needed for fever, headache or mild pain (use first). 30 tablet 0  . levonorgestrel (MIRENA, 52 MG,) 20 MCG/24HR IUD 1 each by Intrauterine route once.     [name] losartan (COZAAR) 50 MG tablet Take 1 tablet (50 mg total) by mouth daily. 90 tablet 3  . Multiple Vitamin (MULTIVITAMIN) tablet Take 1  tablet by mouth daily.    . Olopatadine HCl (PATADAY) 0.2 % SOLN Place 1 drop into both eyes daily as needed (itching).     . Pseudoephedrine-Ibuprofen 30-200 MG TABS Take 2 tablets by mouth every 6 (six) hours as needed (cough, headache, congestion and fever).    [name] UNITHROID 100 MCG tablet TAKE 1 TABLET (100 MCG TOTAL) BY MOUTH DAILY BEFORE BREAKFAST. 45 tablet 2   No current facility-administered medications on file prior to visit.     Past Medical History:  Diagnosis Date  . Abnormal uterine bleeding   . Anemia   . Depression   . Diabetes mellitus without complication (HCC)    gestational  . Elevated blood-pressure reading without diagnosis of hypertension   . Endometriosis   . Fibroids   . GERD (gastroesophageal reflux disease)   . Graves disease   . Grief at loss of child   . Hypertension   . Hypothyroidism   . Interstitial cystitis   . Menorrhagia   . Oropharyngeal candidiasis 01/16/2018  . OSA (obstructive sleep apnea), severe 06/23/2018  . Premature ventricular contractions    a. rare PVC by monitor 12/2016.  . Right ovarian cyst   . Thyroid disease   . Uterine leiomyoma     Past Surgical History:  Procedure Laterality Date  . APPENDECTOMY    . ARTERY BIOPSY Left 10/25/2017   Procedure: BIOPSY TEMPORAL ARTERY;  Surgeon: Aviva Signs, MD;  Location: [facility];  Service: General;  Laterality: Left;  . CESAREAN SECTION    . CHOLECYSTECTOMY    . HERNIA REPAIR     incisional  . TUMOR REMOVAL  fibroids    Social History   Socioeconomic History  . Marital status: Married    Spouse name: Not on file  . Number of children: Not on file  . Years of education: Not on file  . Highest education level: Not on file  Occupational History  . Not on file  Social Needs  . Financial resource strain: Not on file  . Food insecurity:    Worry: Not on file    Inability: Not on file  . Transportation needs:    Medical: Not on file    Non-medical: Not on file  Tobacco Use  .  Smoking status: Never Smoker  . Smokeless tobacco: Never Used  Substance and Sexual Activity  . Alcohol use: No  . Drug use: No  . Sexual activity: Yes    Birth control/protection: I.U.D.    Comment: Mirena inserted 2015/2016  Lifestyle  . Physical activity:    Days per week: Not on file    Minutes per session: Not on file  . Stress: Not on file  Relationships  . Social connections:    Talks on phone: Not on file    Gets together: Not on file    Attends religious service: Not on file  Active member of club or organization: Not on file    Attends meetings of clubs or organizations: Not on file    Relationship status: Not on file  Other Topics Concern  . Not on file  Social History Narrative  . Not on file    Family History  Problem Relation Age of Onset  . Hypertension Mother   . Cancer Mother   . Breast cancer Mother 13  . Hypertension Father   . Diabetes Father   . Cancer Father   . Ovarian cancer Maternal Aunt   . Thyroid disease Maternal Aunt        hypothyroid    Review of Systems  Constitutional: Negative for chills, diaphoresis and fever.  HENT: Negative for congestion, ear pain, sinus pain and sore throat.   Respiratory: Positive for cough (dry, occ mucus improving). Negative for shortness of breath and wheezing.   Cardiovascular: Positive for palpitations (occ). Negative for chest pain.  Gastrointestinal: Negative for diarrhea and nausea.  Neurological: Positive for light-headedness (mild) and headaches (mild).       Objective:   Vitals:   08/29/18 1312  BP: 118/70  Pulse: 95  Resp: 16  Temp: 98.8 F (37.1 C)  SpO2: 96%   BP Readings from Last 3 Encounters:  08/29/18 118/70  08/24/18 (!) 148/84  08/16/18 140/88   Wt Readings from Last 3 Encounters:  08/29/18 (!) 344 lb (156 kg)  08/24/18 (!) 332 lb (150.6 kg)  05/15/18 (!) 357 lb (161.9 kg)   Body mass index is 57.24 kg/m.   Physical Exam    Constitutional: Appears well-developed  and well-nourished. No distress.  HENT:  Head: Normocephalic and atraumatic.  Neck: Neck supple. No tracheal deviation present. No thyromegaly present.  No cervical lymphadenopathy Cardiovascular: Normal rate, regular rhythm and normal heart sounds.   No murmur heard. No carotid bruit .  No edema Pulmonary/Chest: Effort normal and breath sounds normal. No respiratory distress. No has no wheezes. No rales.  Skin: Skin is warm and dry. Not diaphoretic.  Psychiatric: Normal mood and affect. Behavior is normal.      Assessment & Plan:    See Problem List for Assessment and Plan of chronic medical problems.

## 2018-08-29 ENCOUNTER — Ambulatory Visit (INDEPENDENT_AMBULATORY_CARE_PROVIDER_SITE_OTHER): Payer: BLUE CROSS/BLUE SHIELD | Admitting: Internal Medicine

## 2018-08-29 ENCOUNTER — Encounter: Payer: Self-pay | Admitting: Internal Medicine

## 2018-08-29 VITALS — BP 118/70 | HR 95 | Temp 98.8°F | Resp 16 | Ht 65.0 in | Wt 344.0 lb

## 2018-08-29 DIAGNOSIS — R6889 Other general symptoms and signs: Secondary | ICD-10-CM | POA: Diagnosis not present

## 2018-08-29 DIAGNOSIS — E119 Type 2 diabetes mellitus without complications: Secondary | ICD-10-CM | POA: Diagnosis not present

## 2018-08-29 DIAGNOSIS — K219 Gastro-esophageal reflux disease without esophagitis: Secondary | ICD-10-CM

## 2018-08-29 DIAGNOSIS — I10 Essential (primary) hypertension: Secondary | ICD-10-CM

## 2018-08-29 DIAGNOSIS — Z23 Encounter for immunization: Secondary | ICD-10-CM

## 2018-08-29 LAB — POCT GLYCOSYLATED HEMOGLOBIN (HGB A1C): Hemoglobin A1C: 6.2 % — AB (ref 4.0–5.6)

## 2018-08-29 NOTE — Addendum Note (Signed)
Addended by: Delice Bison E on: 08/29/2018 01:48 PM   Modules accepted: Orders

## 2018-08-29 NOTE — Patient Instructions (Signed)
Your a1c was checked today.   Flu immunization administered today.    Medications reviewed and updated.  Changes include :   none  Please followup in 6 months     Gastroesophageal Reflux Disease, Adult Gastroesophageal reflux (GER) happens when acid from the stomach flows up into the tube that connects the mouth and the stomach (esophagus). Normally, food travels down the esophagus and stays in the stomach to be digested. However, when a person has GER, food and stomach acid sometimes move back up into the esophagus. If this becomes a more serious problem, the person may be diagnosed with a disease called gastroesophageal reflux disease (GERD). GERD occurs when the reflux:  Happens often.  Causes frequent or severe symptoms.  Causes problems such as damage to the esophagus. When stomach acid comes in contact with the esophagus, the acid may cause soreness (inflammation) in the esophagus. Over time, GERD may create small holes (ulcers) in the lining of the esophagus. What are the causes? This condition is caused by a problem with the muscle between the esophagus and the stomach (lower esophageal sphincter, or LES). Normally, the LES muscle closes after food passes through the esophagus to the stomach. When the LES is weakened or abnormal, it does not close properly, and that allows food and stomach acid to go back up into the esophagus. The LES can be weakened by certain dietary substances, medicines, and medical conditions, including:  Tobacco use.  Pregnancy.  Having a hiatal hernia.  Alcohol use.  Certain foods and beverages, such as coffee, chocolate, onions, and peppermint. What increases the risk? You are more likely to develop this condition if you:  Have an increased body weight.  Have a connective tissue disorder.  Use NSAID medicines. What are the signs or symptoms? Symptoms of this condition include:  Heartburn.  Difficult or painful swallowing.  The  feeling of having a lump in the throat.  Abitter taste in the mouth.  Bad breath.  Having a large amount of saliva.  Having an upset or bloated stomach.  Belching.  Chest pain. Different conditions can cause chest pain. Make sure you see your health care provider if you experience chest pain.  Shortness of breath or wheezing.  Ongoing (chronic) cough or a night-time cough.  Wearing away of tooth enamel.  Weight loss. How is this diagnosed? Your health care provider will take a medical history and perform a physical exam. To determine if you have mild or severe GERD, your health care provider may also monitor how you respond to treatment. You may also have tests, including:  A test to examine your stomach and esophagus with a small camera (endoscopy).  A test thatmeasures the acidity level in your esophagus.  A test thatmeasures how much pressure is on your esophagus.  A barium swallow or modified barium swallow test to show the shape, size, and functioning of your esophagus. How is this treated? The goal of treatment is to help relieve your symptoms and to prevent complications. Treatment for this condition may vary depending on how severe your symptoms are. Your health care provider may recommend:  Changes to your diet.  Medicine.  Surgery. Follow these instructions at home: Eating and drinking   Follow a diet as recommended by your health care provider. This may involve avoiding foods and drinks such as: ? Coffee and tea (with or without caffeine). ? Drinks that containalcohol. ? Energy drinks and sports drinks. ? Carbonated drinks or sodas. ? Chocolate and  cocoa. ? Peppermint and mint flavorings. ? Garlic and onions. ? Horseradish. ? Spicy and acidic foods, including peppers, chili powder, curry powder, vinegar, hot sauces, and barbecue sauce. ? Citrus fruit juices and citrus fruits, such as oranges, lemons, and limes. ? Tomato-based foods, such as red  sauce, chili, salsa, and pizza with red sauce. ? Fried and fatty foods, such as donuts, french fries, potato chips, and high-fat dressings. ? High-fat meats, such as hot dogs and fatty cuts of red and white meats, such as rib eye steak, sausage, ham, and bacon. ? High-fat dairy items, such as whole milk, butter, and cream cheese.  Eat small, frequent meals instead of large meals.  Avoid drinking large amounts of liquid with your meals.  Avoid eating meals during the 2-3 hours before bedtime.  Avoid lying down right after you eat.  Do not exercise right after you eat. Lifestyle   Do not use any products that contain nicotine or tobacco, such as cigarettes, e-cigarettes, and chewing tobacco. If you need help quitting, ask your health care provider.  Try to reduce your stress by using methods such as yoga or meditation. If you need help reducing stress, ask your health care provider.  If you are overweight, reduce your weight to an amount that is healthy for you. Ask your health care provider for guidance about a safe weight loss goal. General instructions  Pay attention to any changes in your symptoms.  Take over-the-counter and prescription medicines only as told by your health care provider. Do not take aspirin, ibuprofen, or other NSAIDs unless your health care provider told you to do so.  Wear loose-fitting clothing. Do not wear anything tight around your waist that causes pressure on your abdomen.  Raise (elevate) the head of your bed about 6 inches (15 cm).  Avoid bending over if this makes your symptoms worse.  Keep all follow-up visits as told by your health care provider. This is important. Contact a health care provider if:  You have: ? New symptoms. ? Unexplained weight loss. ? Difficulty swallowing or it hurts to swallow. ? Wheezing or a persistent cough. ? A hoarse voice.  Your symptoms do not improve with treatment. Get help right away if you:  Have pain in  your arms, neck, jaw, teeth, or back.  Feel sweaty, dizzy, or light-headed.  Have chest pain or shortness of breath.  Vomit and your vomit looks like blood or coffee grounds.  Faint.  Have stool that is bloody or black.  Cannot swallow, drink, or eat. Summary  Gastroesophageal reflux happens when acid from the stomach flows up into the esophagus. GERD is a disease in which the reflux happens often, causes frequent or severe symptoms, or causes problems such as damage to the esophagus.  Treatment for this condition may vary depending on how severe your symptoms are. Your health care provider may recommend diet and lifestyle changes, medicine, or surgery.  Contact a health care provider if you have new or worsening symptoms.  Take over-the-counter and prescription medicines only as told by your health care provider. Do not take aspirin, ibuprofen, or other NSAIDs unless your health care provider told you to do so.  Keep all follow-up visits as told by your health care provider. This is important. This information is not intended to replace advice given to you by your health care provider. Make sure you discuss any questions you have with your health care provider. Document Released: 03/21/2005 Document Revised: 12/18/2017 Document Reviewed:  12/18/2017 Elsevier Interactive Patient Education  [employer].

## 2018-08-29 NOTE — Assessment & Plan Note (Signed)
BP well controlled Current regimen effective and well tolerated Continue current medications at current doses  

## 2018-08-29 NOTE — Assessment & Plan Note (Signed)
Having daily GERD symptoms Recommended taking something like pantoprazole daily, but it does not have to be pantoprazole since she tried that and did not tolerate it Discussed also taking Pepcid twice daily She would like to stick with Tums and revise her diet Stressed that if her GERD does not get better she needs to be on a daily medication

## 2018-08-29 NOTE — Assessment & Plan Note (Signed)
Off steroids and no longer on medications for her sugars Sugars at home have been consistently less than 130 We will check A1c Stressed regular exercise New diabetic diet Follow-up in 6 months

## 2018-08-29 NOTE — Assessment & Plan Note (Signed)
She went to the emergency room twice for flulike symptoms-she was not tested for flu, but diagnosed with flu Completed Tamiflu and symptomatic medications She is much better, but not 100% Continue supportive measures Continue increase rest and fluids Expect symptoms to continually improve and resolve

## 2018-08-29 NOTE — Addendum Note (Signed)
Addended by: Delice Bison E on: 08/29/2018 02:13 PM   Modules accepted: Orders

## 2018-09-05 ENCOUNTER — Encounter: Payer: Self-pay | Admitting: Emergency Medicine

## 2018-09-05 ENCOUNTER — Encounter: Payer: Self-pay | Admitting: Obstetrics and Gynecology

## 2018-09-05 NOTE — Telephone Encounter (Signed)
Multiple calls have been made to patient regarding recommended ultrasound. Has declined to scheduled due to other medical issues and concerns. Declined alternative scheduling options. Please advise if standard procedure letter can be mailed or another recommendation?

## 2018-09-05 NOTE — Telephone Encounter (Signed)
Please sent standard letter.  Nothing further needed at this time.

## 2018-09-05 NOTE — Telephone Encounter (Signed)
Standard letter mailed to home address of record.  Encounter closed.

## 2018-09-11 ENCOUNTER — Ambulatory Visit: Payer: BLUE CROSS/BLUE SHIELD | Admitting: Internal Medicine

## 2018-09-11 NOTE — Progress Notes (Signed)
Subjective:    Patient ID: Erin [name], female    DOB: 01-01-1966, 53 y.o.   MRN: [id number]  HPI She is here for an acute visit for cold symptoms.  Her symptoms started when she got the flu shot 3/6 and have been persistent.   She is experiencing fever, chills, aches and diarrhea that started after the flu shot and it has waxed and waned since then.  Her fever has been low grade in the past three days.  She has had good days and will do more those days and then feels worse.  She is sweating.  She has occasional headaches and lightheadedness.  She has nasal congestion, PND, cough that sound wet and comes in spasms, nausea and diarrhea.  She will have diarrhea a few times a day and then go a day without diarrhea and it will start up again.    She has taken coricidin, advil   Medications and allergies reviewed with patient and updated if appropriate.  Patient Active Problem List   Diagnosis Date Noted  . Flu-like symptoms 08/29/2018  . Right ovarian cyst   . OSA (obstructive sleep apnea), severe 06/23/2018  . Anxiety 03/05/2018  . Chronic nonintractable headache 03/05/2018  . Hyperlipidemia 01/23/2018  . Elevated liver enzymes   . Vaginal candidiasis 01/16/2018  . Diabetes mellitus type 2, uncomplicated ([place]) 60/63/0160  . DKA (diabetic ketoacidoses) ([place]) 01/15/2018  . Left facial pressure and pain 11/09/2017  . Gastroesophageal reflux disease 10/05/2017  . Temporal arteritis ([place]) 10/03/2017  . Right foot pain 07/15/2017  . Grief at loss of child 11/02/2016  . H/O gestational diabetes mellitus, not currently pregnant 09/28/2015  . Hypertension 09/28/2015  . Morbid obesity ([place]) 05/17/2015  . Umbilical hernia 10/93/2355  . Chronic lower back pain 05/17/2015  . Snoring 05/17/2015  . Palpitations 05/17/2015  . ANEMIA 09/03/2007  . Hypothyroidism following radioiodine therapy 09/02/2007  . INTERSTITIAL CYSTITIS 09/01/2007  . ALLERGIC RHINITIS 08/07/2007  . [place]  DISEASE, LUMBAR 08/07/2007    Current Outpatient Medications on File Prior to Visit  Medication Sig Dispense Refill  . cholecalciferol (VITAMIN D) 1000 units tablet Take 1,000 Units by mouth daily.    [name] diltiazem (CARDIZEM LA) 120 MG 24 hr tablet Take 1 tablet (120 mg total) by mouth daily. Overdue for appt must see provider for future refills 30 tablet 0  . gabapentin (NEURONTIN) 100 MG capsule TAKE 2 CAPSULES (200 MG TOTAL) BY MOUTH AT BEDTIME. 180 capsule 2  . ibuprofen (ADVIL,MOTRIN) 400 MG tablet Take 1 tablet (400 mg total) by mouth every 6 (six) hours as needed for fever, headache or mild pain (use first). 30 tablet 0  . levonorgestrel (MIRENA, 52 MG,) 20 MCG/24HR IUD 1 each by Intrauterine route once.     [name] losartan (COZAAR) 50 MG tablet Take 1 tablet (50 mg total) by mouth daily. 90 tablet 3  . Multiple Vitamin (MULTIVITAMIN) tablet Take 1 tablet by mouth daily.    . Olopatadine HCl (PATADAY) 0.2 % SOLN Place 1 drop into both eyes daily as needed (itching).     . Pseudoephedrine-Ibuprofen 30-200 MG TABS Take 2 tablets by mouth every 6 (six) hours as needed (cough, headache, congestion and fever).    [name] UNITHROID 100 MCG tablet TAKE 1 TABLET (100 MCG TOTAL) BY MOUTH DAILY BEFORE BREAKFAST. 45 tablet 2   No current facility-administered medications on file prior to visit.     Past Medical History:  Diagnosis Date  . Abnormal  uterine bleeding   . Anemia   . Depression   . Diabetes mellitus without complication (HCC)    gestational  . Elevated blood-pressure reading without diagnosis of hypertension   . Endometriosis   . Fibroids   . GERD (gastroesophageal reflux disease)   . Graves disease   . Grief at loss of child   . Hypertension   . Hypothyroidism   . Interstitial cystitis   . Menorrhagia   . Oropharyngeal candidiasis 01/16/2018  . OSA (obstructive sleep apnea), severe 06/23/2018  . Premature ventricular contractions    a. rare PVC by monitor 12/2016.  . Right ovarian  cyst    3 cm.  . Thyroid disease   . Uterine leiomyoma     Past Surgical History:  Procedure Laterality Date  . APPENDECTOMY    . ARTERY BIOPSY Left 10/25/2017   Procedure: BIOPSY TEMPORAL ARTERY;  Surgeon: Aviva Signs, MD;  Location: [facility];  Service: General;  Laterality: Left;  . CESAREAN SECTION    . CHOLECYSTECTOMY    . HERNIA REPAIR     incisional  . TUMOR REMOVAL  fibroids    Social History   Socioeconomic History  . Marital status: Married    Spouse name: Not on file  . Number of children: Not on file  . Years of education: Not on file  . Highest education level: Not on file  Occupational History  . Not on file  Social Needs  . Financial resource strain: Not on file  . Food insecurity:    Worry: Not on file    Inability: Not on file  . Transportation needs:    Medical: Not on file    Non-medical: Not on file  Tobacco Use  . Smoking status: Never Smoker  . Smokeless tobacco: Never Used  Substance and Sexual Activity  . Alcohol use: No  . Drug use: No  . Sexual activity: Yes    Birth control/protection: I.U.D.    Comment: Mirena inserted 2015/2016  Lifestyle  . Physical activity:    Days per week: Not on file    Minutes per session: Not on file  . Stress: Not on file  Relationships  . Social connections:    Talks on phone: Not on file    Gets together: Not on file    Attends religious service: Not on file    Active member of club or organization: Not on file    Attends meetings of clubs or organizations: Not on file    Relationship status: Not on file  Other Topics Concern  . Not on file  Social History Narrative  . Not on file    Family History  Problem Relation Age of Onset  . Hypertension Mother   . Cancer Mother   . Breast cancer Mother 26  . Hypertension Father   . Diabetes Father   . Cancer Father   . Ovarian cancer Maternal Aunt   . Thyroid disease Maternal Aunt        hypothyroid    Review of Systems  Constitutional: Positive  for chills and fever.  HENT: Positive for congestion and postnasal drip. Negative for ear pain, sinus pain and sore throat.   Respiratory: Positive for cough (wet  - productive). Negative for shortness of breath and wheezing.   Cardiovascular: Negative for chest pain.  Gastrointestinal: Positive for diarrhea (intermittent) and nausea. Negative for vomiting.  Musculoskeletal: Positive for myalgias.  Neurological: Positive for light-headedness and headaches.  Objective:   Vitals:   09/12/18 1302  BP: 134/82  Pulse: 91  Resp: 18  Temp: 99.2 F (37.3 C)  SpO2: 98%   Filed Weights   Body mass index is 57.24 kg/m.  Wt Readings from Last 3 Encounters:  08/29/18 (!) 344 lb (156 kg)  08/24/18 (!) 332 lb (150.6 kg)  05/15/18 (!) 357 lb (161.9 kg)     Physical Exam GENERAL APPEARANCE: Appears stated age, well appearing, NAD EYES: conjunctiva clear, no icterus HEENT: bilateral ear canals with moderate cerumen, partially visualized TM's b/l normal, oropharynx with mild erythema, no thyromegaly, trachea midline, no cervical or supraclavicular lymphadenopathy LUNGS: Clear to auscultation without wheeze or crackles, unlabored breathing, good air entry bilaterally CARDIOVASCULAR: Normal S1,S2 without murmurs, no edema SKIN: warm, dry        Assessment & Plan:   See Problem List for Assessment and Plan of chronic medical problems.

## 2018-09-12 ENCOUNTER — Ambulatory Visit (INDEPENDENT_AMBULATORY_CARE_PROVIDER_SITE_OTHER): Payer: BLUE CROSS/BLUE SHIELD | Admitting: Internal Medicine

## 2018-09-12 ENCOUNTER — Telehealth: Payer: Self-pay | Admitting: *Deleted

## 2018-09-12 ENCOUNTER — Encounter: Payer: Self-pay | Admitting: Internal Medicine

## 2018-09-12 ENCOUNTER — Other Ambulatory Visit: Payer: Self-pay

## 2018-09-12 VITALS — BP 134/82 | HR 91 | Temp 99.2°F | Resp 18 | Ht 65.0 in

## 2018-09-12 DIAGNOSIS — B349 Viral infection, unspecified: Secondary | ICD-10-CM | POA: Diagnosis not present

## 2018-09-12 MED ORDER — ONDANSETRON 8 MG PO TBDP: 8.0000 mg | ORAL_TABLET | Freq: Three times a day (TID) | ORAL | 0 refills | Status: DC | PRN

## 2018-09-12 MED ORDER — DILTIAZEM HCL ER COATED BEADS 120 MG PO TB24: 120.0000 mg | ORAL_TABLET | Freq: Every day | ORAL | 1 refills | Status: DC

## 2018-09-12 NOTE — Assessment & Plan Note (Addendum)
Symptoms consistent with a viral illness Has been ongoing for 2 weeks but she has good days Diarrhea is not consistent - it is intermittent Fevers are low grade and may be getting better Will continue to treat symptomatically Tylenol, coricidin, zofran prn, fluids, rest She will keep me updated via mychart

## 2018-09-12 NOTE — Patient Instructions (Signed)
  Medications reviewed and updated.  Changes include :   None  Take the anti-nausea medication as needed.  Continue coricidin.  Increase tylenol use.    Your prescription(s) have been submitted to your pharmacy. Please take as directed and contact our office if you believe you are having problem(s) with the medication(s).    Call if no improvement / symptoms change

## 2018-09-12 NOTE — Telephone Encounter (Signed)
Called to screen patient for covid-19 before PCP visit today. Patient shared that she has had flu-like symptoms for about 4 weeks. She was seen in the ED and took a course of Tamiflu.  Currently she does not have fever and the last fever was 3 days ago, low grade. She does have a cough with clear sputum and denies any SOB. Patient states that her symptom have not grown in severity and have improved a little from the start of her sickness. Patient was instructed to wash her hands and to get a mask when she arrives for her visit.

## 2018-09-22 ENCOUNTER — Telehealth: Payer: Self-pay | Admitting: Neurology

## 2018-09-22 NOTE — Telephone Encounter (Signed)
Due to current COVID 19 pandemic, our office is severely reducing in office visits for at least the next 2 weeks, in order to minimize the risk to our patients and healthcare providers. Pt understands that although there may be some limitations with this type of visit, we will take all precautions to reduce any security or privacy concerns.  Pt understands that this will be treated like an in office visit and we will file with pt's insurance, and there may be a patient responsible charge related to this service. Pt's number to call for visit [phone]. Pt did not wish to have a virtual visit.

## 2018-09-23 NOTE — Telephone Encounter (Signed)
I called pt and updated meds, allergies, and PMH. I reminded pt of her telephone visit with Dr. Rexene Alberts tomorrow. Pt verbalized understanding.

## 2018-09-23 NOTE — Telephone Encounter (Signed)
I called pt. She has not started her cpap due to insurance coverage. She wants to start a cpap. I recommended that we delay pt's appt tomorrow for another 3 months until pt can start cpap. Pt's appt was moved to 12/24/2018 at 3:00pm.  I will reach out to [facility] and ask them to call pt again. Pt verbalized understanding of the above information.

## 2018-09-24 ENCOUNTER — Ambulatory Visit: Payer: Self-pay | Admitting: Neurology

## 2018-09-24 ENCOUNTER — Encounter: Payer: Self-pay | Admitting: Internal Medicine

## 2018-09-26 MED ORDER — DILTIAZEM HCL ER 120 MG PO CP24: 120.0000 mg | ORAL_CAPSULE | Freq: Every day | ORAL | 1 refills | Status: DC

## 2018-09-26 NOTE — Addendum Note (Signed)
Addended by: Binnie Rail on: 09/26/2018 12:51 PM   Modules accepted: Orders

## 2018-10-06 ENCOUNTER — Encounter: Payer: Self-pay | Admitting: Internal Medicine

## 2018-10-07 ENCOUNTER — Ambulatory Visit (INDEPENDENT_AMBULATORY_CARE_PROVIDER_SITE_OTHER): Payer: BLUE CROSS/BLUE SHIELD | Admitting: Internal Medicine

## 2018-10-07 ENCOUNTER — Encounter: Payer: Self-pay | Admitting: Internal Medicine

## 2018-10-07 DIAGNOSIS — M5416 Radiculopathy, lumbar region: Secondary | ICD-10-CM | POA: Diagnosis not present

## 2018-10-07 MED ORDER — METHOCARBAMOL 500 MG PO TABS: 500.0000 mg | ORAL_TABLET | Freq: Four times a day (QID) | ORAL | 0 refills | Status: DC | PRN

## 2018-10-07 MED ORDER — OXYCODONE-ACETAMINOPHEN 5-325 MG PO TABS: 1.0000 | ORAL_TABLET | ORAL | 0 refills | Status: DC | PRN

## 2018-10-07 NOTE — Assessment & Plan Note (Signed)
2 days of left lower back pain with pain/numbness/tingling in left leg Tightness in lower back-probable muscle spasms Taking gabapentin, Advil/Aleve without significant improvement at home Pain is severe Continue gabapentin, but take a low dose in the morning, 200 mg and take a higher dose at bedtime, 600 mg since it makes her drowsy.  Discussed that this dose can be adjusted Start methocarbamol, which she has taken in the past for probable muscle spasms Percocet as needed for severe [place] controlled substance database reviewed.  She has taken this before and it is tolerated.  She is aware of possible side effects. Can ice/heat Can take Advil/Aleve Can consider prednisone in the near future, but she was on a great deal of prednisone last year we will try to avoid this Advised her to try the exercises once her pain gets a little bit better  Call with any questions or concerns or if her symptoms or not getting better.

## 2018-10-07 NOTE — Progress Notes (Signed)
Virtual Visit via Video Note  I connected with Erin [name] on 10/07/18 at  1:30 PM EDT by a video enabled telemedicine application and verified that I am speaking with the correct person using two identifiers.   I discussed the limitations of evaluation and management by telemedicine and the availability of in person appointments. The patient expressed understanding and agreed to proceed.  The patient is currently at home and I am in the office.    No referring provider.    History of Present Illness: This is an acute visit for lower back pain.  She has had lower back pain in the past and this episode of back pain started Sunday, 2 days ago, and is similar to her previous episodes.  She thinks she may have pulled something in her back when sleeping or in the shower.  The pain is in the left lower back and she has some mild pain in the left leg.  She has had some numbness/tingling in the leg.  She does feel tightness in the lower back like muscle spasms.  She denies any fevers, bladder or bowel incontinence.  She rates the pain as severe-more than 10/10.  She feels like she just needs relief so she can avoid going to the emergency room.  She has pain with every position.  Walking increases the pain.  She has been taking gabapentin, and it helps only a little bit.  She is taking 200 mg 4 times a day.  It does make her a little drowsy.  She also took advil, aleve and they helped and they have not helped.  She did try the exercises she is done in the past, but it was too painful.  In the past she has taken a muscle relaxer and wanted to get a prescription for that.  She is looking for anything this can provide her some relief.  Otherwise she feels well and has no concerns.   Social History   Socioeconomic History  . Marital status: Married    Spouse name: Not on file  . Number of children: Not on file  . Years of education: Not on file  . Highest education level: Not on file   Occupational History  . Not on file  Social Needs  . Financial resource strain: Not on file  . Food insecurity:    Worry: Not on file    Inability: Not on file  . Transportation needs:    Medical: Not on file    Non-medical: Not on file  Tobacco Use  . Smoking status: Never Smoker  . Smokeless tobacco: Never Used  Substance and Sexual Activity  . Alcohol use: No  . Drug use: No  . Sexual activity: Yes    Birth control/protection: I.U.D.    Comment: Mirena inserted 2015/2016  Lifestyle  . Physical activity:    Days per week: Not on file    Minutes per session: Not on file  . Stress: Not on file  Relationships  . Social connections:    Talks on phone: Not on file    Gets together: Not on file    Attends religious service: Not on file    Active member of club or organization: Not on file    Attends meetings of clubs or organizations: Not on file    Relationship status: Not on file  Other Topics Concern  . Not on file  Social History Narrative  . Not on file     Observations/Objective: Appears  well.  In moderate discomfort, especially with certain movements, but in no acute distress   Assessment and Plan:  See Problem List for Assessment and Plan of chronic medical problems.   Follow Up Instructions:    I discussed the assessment and treatment plan with the patient. The patient was provided an opportunity to ask questions and all were answered. The patient agreed with the plan and demonstrated an understanding of the instructions.   The patient was advised to call back or seek an in-person evaluation if the symptoms worsen or if the condition fails to improve as anticipated.    Binnie Rail, MD

## 2018-10-08 ENCOUNTER — Encounter: Payer: Self-pay | Admitting: Internal Medicine

## 2018-10-10 DIAGNOSIS — G4731 Primary central sleep apnea: Secondary | ICD-10-CM | POA: Diagnosis not present

## 2018-10-18 ENCOUNTER — Encounter: Payer: Self-pay | Admitting: Internal Medicine

## 2018-10-18 ENCOUNTER — Ambulatory Visit (INDEPENDENT_AMBULATORY_CARE_PROVIDER_SITE_OTHER): Payer: BLUE CROSS/BLUE SHIELD | Admitting: Family Medicine

## 2018-10-18 ENCOUNTER — Encounter: Payer: Self-pay | Admitting: Family Medicine

## 2018-10-18 ENCOUNTER — Other Ambulatory Visit: Payer: Self-pay

## 2018-10-18 VITALS — BP 150/91 | HR 89 | Resp 12

## 2018-10-18 DIAGNOSIS — I10 Essential (primary) hypertension: Secondary | ICD-10-CM

## 2018-10-18 DIAGNOSIS — E876 Hypokalemia: Secondary | ICD-10-CM | POA: Diagnosis not present

## 2018-10-18 DIAGNOSIS — R42 Dizziness and giddiness: Secondary | ICD-10-CM

## 2018-10-18 DIAGNOSIS — R110 Nausea: Secondary | ICD-10-CM

## 2018-10-18 NOTE — Progress Notes (Signed)
Virtual Visit via Video Note   I connected with Erin [name] on 10/18/18 at 11:00 AM EDT by a video enabled telemedicine application and verified that I am speaking with the correct person using two identifiers.  Location patient: home Location provider:work or home office Persons participating in the virtual visit: patient, provider  I discussed the limitations of evaluation and management by telemedicine and the availability of in person appointments. She expressed understanding and agreed to proceed.   HPI: She has had similar episodes of lightheadedness intermittently for years, and this morning it was "horrible.".  Associated nausea, she already has Zofran at home prescribed by PCP. Mild chills. Episode of lightheadedness happened today when she first got up from bed, lasted a few seconds "less than a min." She is not sure about exacerbating or alleviating factors.  She denies associated hearing loss, tinnitus, or syncope. Negative for unusual fatigue, decreased appetite, or body aches.  This morning she had a loose stool. She denies fever, nasal congestion, runny nose, vomiting, blood in the stool, or urinary symptoms. She has not tried medications today.  Lightheadedness have resolved but still having "little" nausea.  Negative for sick contacts or travel.  A few days ago she started methocarbamol but no other new medication. History of OSA, she has been wearing CPAP for a couple weeks.  BP elevated today. HTN,Currently she is on losartan 50 mg daily and diltiazem XR 120 mg daily. Denies headache, visual changes, chest pain, dyspnea, palpitation,, focal weakness, or edema.  Reviewing labs I noted mild hypokalemia. She has not noted muscle cramps.  Lab Results  Component Value Date   CREATININE 0.81 08/24/2018   BUN 22 (H) 08/24/2018   NA 141 08/24/2018   K 3.3 (L) 08/24/2018   CL 108 08/24/2018   CO2 25 08/24/2018    ROS: See pertinent positives and negatives per  HPI. COVID-19 screening questions: Denies new cough,sore throat,or possible exposure to COVID-19. Negative for loss in the sense of smell or taste.   Past Medical History:  Diagnosis Date  . Abnormal uterine bleeding   . Anemia   . Depression   . Diabetes mellitus without complication (HCC)    gestational  . Elevated blood-pressure reading without diagnosis of hypertension   . Endometriosis   . Fibroids   . GERD (gastroesophageal reflux disease)   . Graves disease   . Grief at loss of child   . H/O gestational diabetes mellitus, not currently pregnant 09/28/2015  . Hypertension   . Hypothyroidism   . Interstitial cystitis   . Menorrhagia   . Oropharyngeal candidiasis 01/16/2018  . OSA (obstructive sleep apnea), severe 06/23/2018  . Premature ventricular contractions    a. rare PVC by monitor 12/2016.  . Right ovarian cyst    3 cm.  . Thyroid disease   . Uterine leiomyoma     Past Surgical History:  Procedure Laterality Date  . APPENDECTOMY    . ARTERY BIOPSY Left 10/25/2017   Procedure: BIOPSY TEMPORAL ARTERY;  Surgeon: Aviva Signs, MD;  Location: [facility];  Service: General;  Laterality: Left;  . CESAREAN SECTION    . CHOLECYSTECTOMY    . HERNIA REPAIR     incisional  . TUMOR REMOVAL  fibroids    Family History  Problem Relation Age of Onset  . Hypertension Mother   . Cancer Mother   . Breast cancer Mother 21  . Hypertension Father   . Diabetes Father   . Cancer Father   .  Ovarian cancer Maternal Aunt   . Thyroid disease Maternal Aunt        hypothyroid    Social History   Socioeconomic History  . Marital status: Married    Spouse name: Not on file  . Number of children: Not on file  . Years of education: Not on file  . Highest education level: Not on file  Occupational History  . Not on file  Social Needs  . Financial resource strain: Not on file  . Food insecurity:    Worry: Not on file    Inability: Not on file  . Transportation needs:     Medical: Not on file    Non-medical: Not on file  Tobacco Use  . Smoking status: Never Smoker  . Smokeless tobacco: Never Used  Substance and Sexual Activity  . Alcohol use: No  . Drug use: No  . Sexual activity: Yes    Birth control/protection: I.U.D.    Comment: Mirena inserted 2015/2016  Lifestyle  . Physical activity:    Days per week: Not on file    Minutes per session: Not on file  . Stress: Not on file  Relationships  . Social connections:    Talks on phone: Not on file    Gets together: Not on file    Attends religious service: Not on file    Active member of club or organization: Not on file    Attends meetings of clubs or organizations: Not on file    Relationship status: Not on file  . Intimate partner violence:    Fear of current or ex partner: Not on file    Emotionally abused: Not on file    Physically abused: Not on file    Forced sexual activity: Not on file  Other Topics Concern  . Not on file  Social History Narrative  . Not on file      Current Outpatient Medications:  .  cholecalciferol (VITAMIN D) 1000 units tablet, Take 1,000 Units by mouth daily., Disp: , Rfl:  .  diltiazem (DILACOR XR) 120 MG 24 hr capsule, Take 1 capsule (120 mg total) by mouth daily., Disp: 90 capsule, Rfl: 1 .  gabapentin (NEURONTIN) 100 MG capsule, TAKE 2 CAPSULES (200 MG TOTAL) BY MOUTH AT BEDTIME., Disp: 180 capsule, Rfl: 2 .  ibuprofen (ADVIL,MOTRIN) 400 MG tablet, Take 1 tablet (400 mg total) by mouth every 6 (six) hours as needed for fever, headache or mild pain (use first)., Disp: 30 tablet, Rfl: 0 .  levonorgestrel (MIRENA, 52 MG,) 20 MCG/24HR IUD, 1 each by Intrauterine route once. , Disp: , Rfl:  .  losartan (COZAAR) 50 MG tablet, Take 1 tablet (50 mg total) by mouth daily., Disp: 90 tablet, Rfl: 3 .  methocarbamol (ROBAXIN) 500 MG tablet, Take 1 tablet (500 mg total) by mouth every 6 (six) hours as needed for muscle spasms., Disp: 90 tablet, Rfl: 0 .  Multiple Vitamin  (MULTIVITAMIN) tablet, Take 1 tablet by mouth daily., Disp: , Rfl:  .  Olopatadine HCl (PATADAY) 0.2 % SOLN, Place 1 drop into both eyes daily as needed (itching). , Disp: , Rfl:  .  oxyCODONE-acetaminophen (PERCOCET) 5-325 MG tablet, Take 1-2 tablets by mouth every 4 (four) hours as needed for severe pain., Disp: 40 tablet, Rfl: 0 .  Pseudoephedrine-Ibuprofen 30-200 MG TABS, Take 2 tablets by mouth every 6 (six) hours as needed (cough, headache, congestion and fever)., Disp: , Rfl:  .  UNITHROID 100 MCG tablet, TAKE 1  TABLET (100 MCG TOTAL) BY MOUTH DAILY BEFORE BREAKFAST., Disp: 45 tablet, Rfl: 2  EXAM:  VITALS per patient if applicable:BP (!) 081/44   Pulse 89   Resp 12   GENERAL: alert, oriented, appears well and in no acute distress  HEENT: atraumatic, conjunctiva clear, no obvious facial abnormalities on inspection.  NECK: normal movements of the head and neck  LUNGS: on inspection no signs of respiratory distress, breathing rate appears normal, no obvious gross SOB, gasping or wheezing  CV: no obvious cyanosis  Erin: moves all visible extremities without noticeable abnormality  PSYCH/NEURO: pleasant and cooperative, no obvious depression or anxiety, speech and thought processing grossly intact. No focal deficit and walking around her house with no assistance or difficulty.  ASSESSMENT AND PLAN:  Discussed the following assessment and plan:  Lightheadedness We discussed possible etiologies, explained that this is a very nonspecific symptom that could be related with either benign and serious process. History provided today does not suggest a serious illness. Adequate hydration and rest. Fall precautions. Clearly instructed about warning signs.  Essential hypertension BP mildly elevated today, she is reporting lower readings as her normal. For now no changes in losartan 50 mg daily. Continue monitoring BP daily. Continue low-salt diet. F/U with PCP in 3-4 weeks.  Nausea  without vomiting She already has Zofran at home, recommend taking it twice daily as needed. She had a loose stool this morning, recommend monitoring for new symptoms.?  Beginning of acute gastroenteritis. ?  GERD related. Recommend monitoring temperature daily. Bland diet, small portions at the time, and small/frequent sips of clear fluids.  Hypokalemia Mild. I do not think she needs blood work today, this can be followed by PCP next visit. Potassium rich diet to continue.    I discussed the assessment and treatment plan with the patient. She was provided an opportunity to ask questions and all were answered. The patient agreed with the plan and demonstrated an understanding of the instructions.   She was advised to call back or seek an in-person evaluation if the symptoms worsen or if the condition fails to improve as anticipated.  Return in about 4 weeks (around 11/15/2018) for Lightheadedness and hypertension..    Sugey Trevathan Martinique, MD

## 2018-10-20 ENCOUNTER — Telehealth: Payer: Self-pay | Admitting: Internal Medicine

## 2018-10-20 NOTE — Telephone Encounter (Signed)
Patient called 10/18/18 at 10:27am to [facility].  States she she has chills and night sweats.  She feels light headed as if she is going to pass out. Looks like patient had a doxy with on Saturday.

## 2018-10-20 NOTE — Telephone Encounter (Signed)
Patient scheduled.

## 2018-10-20 NOTE — Telephone Encounter (Signed)
Routing to tammy/sched--patient needs 4 week follow up with dr burns (around 11/17/18)--if we are still doing video/virtual, but patient is feeling better, patient may want to wait until she can be seen in office for follow up labs, as well

## 2018-10-23 ENCOUNTER — Ambulatory Visit: Payer: Self-pay | Admitting: Internal Medicine

## 2018-10-23 NOTE — Telephone Encounter (Signed)
Pt. Reports she had a virtual visit with Dr. Martinique 10/18/18 for dizziness. Pt. Reports she is still having dizziness, mainly in the morning.Feels better as the day goes on. Has some nausea and chills as well. Denies any vertigo. States she has felt bad since her medication was changed 09/26/18. BP 151/82  Pulse 86. Warm transfer to [place] in the practice for a virtual visit.  Answer Assessment - Initial Assessment Questions 1. DESCRIPTION: "Describe your dizziness."     Lightheaded 2. LIGHTHEADED: "Do you feel lightheaded?" (e.g., somewhat faint, woozy, weak upon standing)     Worse in the morning 3. VERTIGO: "Do you feel like either you or the room is spinning or tilting?" (i.e. vertigo)     Lightheaded 4. SEVERITY: "How bad is it?"  "Do you feel like you are going to faint?" "Can you stand and walk?"   - MILD - walking normally   - MODERATE - interferes with normal activities (e.g., work, school)    - SEVERE - unable to stand, requires support to walk, feels like passing out now.      Mild -moderate 5. ONSET:  "When did the dizziness begin?"     April 3 6. AGGRAVATING FACTORS: "Does anything make it worse?" (e.g., standing, change in head position)     Laying down is worse-feels better as the day goes on. 7. HEART RATE: "Can you tell me your heart rate?" "How many beats in 15 seconds?"  (Note: not all patients can do this)       Pulse 86   151/83 8. CAUSE: "What do you think is causing the dizziness?"     New medicine started April 3 9. RECURRENT SYMPTOM: "Have you had dizziness before?" If so, ask: "When was the last time?" "What happened that time?"     Visit with Dr. Martinique 10/18/18 10. OTHER SYMPTOMS: "Do you have any other symptoms?" (e.g., fever, chest pain, vomiting, diarrhea, bleeding)       Nausea, chills, weak 11. PREGNANCY: "Is there any chance you are pregnant?" "When was your last menstrual period?"       No  Protocols used: DIZZINESS [facility]

## 2018-10-23 NOTE — Telephone Encounter (Signed)
Appointment has been made for tomorrow 5/1 with Dr.Burns.

## 2018-10-23 NOTE — Progress Notes (Signed)
Virtual Visit via Video Note  I connected with Erin [name] on 10/24/18 at  9:00 AM EDT by a video enabled telemedicine application and verified that I am speaking with the correct person using two identifiers.   I discussed the limitations of evaluation and management by telemedicine and the availability of in person appointments. The patient expressed understanding and agreed to proceed.  The patient is currently at home and I am in the office.    No referring provider.    History of Present Illness: This is an acute visit for lightheadedness.  She has a virtual visit on 4/25 for the same reason.  Besides the lightheadedness and nausea she was not having any other symptoms at the time of her last virtual visit.  No obvious cause for her symptoms was identified.  She is still having episodic lightheadedness associated with nausea.  She has night sweats associated with chills and often feels cold.  This is unusual for her because she is usually hot.  She has felt like her bowel movements on a couple of occasions would not make her feel like she passed out.  This was not the case today.  She still feels lightheaded this morning, but less faint and not as bad as yesterday.  She did not use her CPAP machine last night and she does not feel as horrible this morning as she did feel like yesterday.  She is unsure if that is the cause of some of her symptoms or not.  The CPAP machine is relatively new.  About the time she started on the CPAP machine she also was changed from twice a day Cardizem to once a day Cardizem.  She is also following with GYN for persistent bleeding.  She is having a fairly normal menses monthly, but she will continue to bleed throughout the month.  She states now that most times when she goes to the bathroom there is blood in the urine or when she wipes there is blood.  She thought initially this was dehydration.  She has been drinking Pedialyte with juice and feels like  she is drinking a good amount.  She has been taking the muscle relaxer as needed-she last took it yesterday morning.  She does not feel this is the cause.  He has been taking the antinausea medication as needed.  Her sugars at home have been good.  She does have a history of diabetes, but this was really centered around when she was on steroids.  Her mouth is dry, but she denies excessive thirst.  She denies urinary symptoms including polyuria, increased urinary frequency and dysuria.   Review of Systems  Constitutional: Positive for chills, diaphoresis (night only) and malaise/fatigue. Negative for fever.       Night sweats  Respiratory: Negative for cough, shortness of breath and wheezing.   Cardiovascular: Positive for palpitations. Negative for chest pain.  Gastrointestinal: Positive for nausea. Negative for abdominal pain, constipation and diarrhea.  Genitourinary: Negative for dysuria and frequency.  Musculoskeletal: Positive for back pain (much better).  Neurological: Positive for headaches.       Lightheadedness  Endo/Heme/Allergies:       No polyuria, Mouth is dry - no excessive thrist Feels cold       Social History   Socioeconomic History  . Marital status: Married    Spouse name: Not on file  . Number of children: Not on file  . Years of education: Not on file  . Highest  education level: Not on file  Occupational History  . Not on file  Social Needs  . Financial resource strain: Not on file  . Food insecurity:    Worry: Not on file    Inability: Not on file  . Transportation needs:    Medical: Not on file    Non-medical: Not on file  Tobacco Use  . Smoking status: Never Smoker  . Smokeless tobacco: Never Used  Substance and Sexual Activity  . Alcohol use: No  . Drug use: No  . Sexual activity: Yes    Birth control/protection: I.U.D.    Comment: Mirena inserted 2015/2016  Lifestyle  . Physical activity:    Days per week: Not on file    Minutes per  session: Not on file  . Stress: Not on file  Relationships  . Social connections:    Talks on phone: Not on file    Gets together: Not on file    Attends religious service: Not on file    Active member of club or organization: Not on file    Attends meetings of clubs or organizations: Not on file    Relationship status: Not on file  Other Topics Concern  . Not on file  Social History Narrative  . Not on file     Observations/Objective: Appears well in NAD Breathing normally  Assessment and Plan:  See Problem List for Assessment and Plan of chronic medical problems.   Follow Up Instructions:    I discussed the assessment and treatment plan with the patient. The patient was provided an opportunity to ask questions and all were answered. The patient agreed with the plan and demonstrated an understanding of the instructions.   The patient was advised to call back or seek an in-person evaluation if the symptoms worsen or if the condition fails to improve as anticipated.    Binnie Rail, MD

## 2018-10-24 ENCOUNTER — Encounter: Payer: Self-pay | Admitting: Internal Medicine

## 2018-10-24 ENCOUNTER — Other Ambulatory Visit (INDEPENDENT_AMBULATORY_CARE_PROVIDER_SITE_OTHER): Payer: BLUE CROSS/BLUE SHIELD

## 2018-10-24 ENCOUNTER — Ambulatory Visit (INDEPENDENT_AMBULATORY_CARE_PROVIDER_SITE_OTHER): Payer: BLUE CROSS/BLUE SHIELD | Admitting: Internal Medicine

## 2018-10-24 DIAGNOSIS — R42 Dizziness and giddiness: Secondary | ICD-10-CM | POA: Diagnosis not present

## 2018-10-24 DIAGNOSIS — D509 Iron deficiency anemia, unspecified: Secondary | ICD-10-CM

## 2018-10-24 DIAGNOSIS — E119 Type 2 diabetes mellitus without complications: Secondary | ICD-10-CM

## 2018-10-24 DIAGNOSIS — N921 Excessive and frequent menstruation with irregular cycle: Secondary | ICD-10-CM | POA: Insufficient documentation

## 2018-10-24 DIAGNOSIS — E890 Postprocedural hypothyroidism: Secondary | ICD-10-CM

## 2018-10-24 LAB — COMPREHENSIVE METABOLIC PANEL
ALT: 17 U/L (ref 0–35)
AST: 14 U/L (ref 0–37)
Albumin: 3.9 g/dL (ref 3.5–5.2)
Alkaline Phosphatase: 137 U/L — ABNORMAL HIGH (ref 39–117)
BUN: 17 mg/dL (ref 6–23)
CO2: 29 mEq/L (ref 19–32)
Calcium: 9.1 mg/dL (ref 8.4–10.5)
Chloride: 105 mEq/L (ref 96–112)
Creatinine, Ser: 0.84 mg/dL (ref 0.40–1.20)
GFR: 86.06 mL/min (ref >60.00)
Glucose, Bld: 137 mg/dL — ABNORMAL HIGH (ref 70–99)
Potassium: 3.5 mEq/L (ref 3.5–5.1)
Sodium: 142 mEq/L (ref 135–145)
Total Bilirubin: 0.5 mg/dL (ref 0.2–1.2)
Total Protein: 7.1 g/dL (ref 6.0–8.3)

## 2018-10-24 LAB — CBC WITH DIFFERENTIAL/PLATELET
Basophils Absolute: 0.1 10*3/uL (ref 0.0–0.1)
Basophils Relative: 0.7 % (ref 0.0–3.0)
Eosinophils Absolute: 0.1 10*3/uL (ref 0.0–0.7)
Eosinophils Relative: 1 % (ref 0.0–5.0)
HCT: 32.7 % — ABNORMAL LOW (ref 36.0–46.0)
Hemoglobin: 10.6 g/dL — ABNORMAL LOW (ref 12.0–15.0)
Lymphocytes Relative: 42.3 % (ref 12.0–46.0)
Lymphs Abs: 5 10*3/uL — ABNORMAL HIGH (ref 0.7–4.0)
MCHC: 32.5 g/dL (ref 30.0–36.0)
MCV: 78.5 fl (ref 78.0–100.0)
Monocytes Absolute: 0.5 10*3/uL (ref 0.1–1.0)
Monocytes Relative: 4.4 % (ref 3.0–12.0)
Neutro Abs: 6.1 10*3/uL (ref 1.4–7.7)
Neutrophils Relative %: 51.6 % (ref 43.0–77.0)
Platelets: 358 10*3/uL (ref 150.0–400.0)
RBC: 4.17 Mil/uL (ref 3.87–5.11)
RDW: 16.9 % — ABNORMAL HIGH (ref 11.5–15.5)
WBC: 11.8 10*3/uL — ABNORMAL HIGH (ref 4.0–10.5)

## 2018-10-24 LAB — TSH: TSH: 8.72 u[IU]/mL — ABNORMAL HIGH (ref 0.35–4.50)

## 2018-10-24 LAB — HEMOGLOBIN A1C: Hgb A1c MFr Bld: 6.9 % — ABNORMAL HIGH (ref 4.6–6.5)

## 2018-10-24 NOTE — Assessment & Plan Note (Signed)
Diabetes when she was on steroids Last A1c was in the prediabetic range She checks her sugars at home intermittently and they have been good Symptoms not likely related to sugars Will check A1c

## 2018-10-24 NOTE — Assessment & Plan Note (Signed)
Following with GYN Has IUD and has regular periods, but is for the most part constantly bleeding May be contributing to some of her symptoms CBC, CMP, iron panel

## 2018-10-24 NOTE — Assessment & Plan Note (Signed)
Following with endocrine, but given recent symptoms will check TSH

## 2018-10-24 NOTE — Assessment & Plan Note (Signed)
Having episodic lightheadedness associated with nausea and several other symptoms ?  Cause Initially thought to be dehydration, but she is drinking plenty and that does not seem to be the case Possibly related to anemia which is secondary to abnormal uterine bleeding May also be related to her CPAP machine or change in Cardizem-recently went from every 12 hours to daily dosing Anemia seems like the most likely Check labs and will go from there If anemia does not seem like it is the cause she will discuss her CPAP with neurology

## 2018-10-25 ENCOUNTER — Encounter: Payer: Self-pay | Admitting: Internal Medicine

## 2018-10-25 LAB — IRON,TIBC AND FERRITIN PANEL
%SAT: 10 % (calc) — ABNORMAL LOW (ref 16–45)
Ferritin: 17 ng/mL (ref 16–232)
Iron: 35 ug/dL — ABNORMAL LOW (ref 45–160)
TIBC: 360 mcg/dL (calc) (ref 250–450)

## 2018-10-26 ENCOUNTER — Encounter: Payer: Self-pay | Admitting: Internal Medicine

## 2018-10-26 MED ORDER — INTEGRA 62.5-62.5-40-3 MG PO CAPS: 1.0000 | ORAL_CAPSULE | Freq: Every day | ORAL | 3 refills | Status: DC

## 2018-10-28 ENCOUNTER — Encounter: Payer: Self-pay | Admitting: Neurology

## 2018-10-28 ENCOUNTER — Encounter: Payer: Self-pay | Admitting: Internal Medicine

## 2018-10-29 ENCOUNTER — Encounter: Payer: Self-pay | Admitting: Obstetrics and Gynecology

## 2018-10-29 ENCOUNTER — Telehealth: Payer: Self-pay

## 2018-10-29 NOTE — Telephone Encounter (Signed)
I called pt, offer her a virtual visit, per [employer].  Pt understands that although there may be some limitations with this type of visit, we will take all precautions to reduce any security or privacy concerns.  Pt understands that this will be treated like an in office visit and we will file with pt's insurance, and there may be a patient responsible charge related to this service.  Pt will use [email or web address], wants the link texted to her.  Pt's meds, allergies, and PMH were updated.  Pt reports that she does not like her auto pap.

## 2018-10-30 ENCOUNTER — Telehealth: Payer: Self-pay | Admitting: Internal Medicine

## 2018-10-30 ENCOUNTER — Ambulatory Visit: Payer: BLUE CROSS/BLUE SHIELD | Admitting: Internal Medicine

## 2018-10-30 ENCOUNTER — Telehealth: Payer: Self-pay | Admitting: Obstetrics and Gynecology

## 2018-10-30 DIAGNOSIS — D649 Anemia, unspecified: Secondary | ICD-10-CM

## 2018-10-30 DIAGNOSIS — R002 Palpitations: Secondary | ICD-10-CM

## 2018-10-30 NOTE — Telephone Encounter (Signed)
Patient return call requesting to talk with nurse, call transferred from front office. Patient reports nausea, feeling like she is going to "pass out", lightheadedness and experiencing night sweats and chills. Denies pain or fever/ Has been taking zofran with no relief. Reports spotting, not enough to wear a pad, only noticing when wiping. Patient is waiting for PCP to return call. Recommended patient f/u with PCP or ER for further evaluation. If GYN still needed, return call to office. Advised I will review with Dr. Quincy Simmonds and return call, patient agreeable.

## 2018-10-30 NOTE — Telephone Encounter (Signed)
Patient advised of below. Today is Dr. Quay Burow afternoon off. I offered her OV with PCP tomorrow 10/31/18. She states she can't wait that long or she will have to go to ED. Pt scheduled with Dr. Jenny Reichmann today at 2:20 pm.

## 2018-10-30 NOTE — Telephone Encounter (Signed)
Yes ok to come in

## 2018-10-30 NOTE — Telephone Encounter (Signed)
Patient is still dizzy, please advise if patient can come into the office for an in person appt

## 2018-10-30 NOTE — Telephone Encounter (Signed)
Message   Good afternoon Dr. Quincy Simmonds, my primary Dr. Quay Burow wanted me to make sure I followed up with you concerning my recent health issues. I have had several virtual visits with Dr. Quay Burow concerning night sweats, chills, nausea, lightheartedness and feeling faint. I did blood work and Dr. Quay Burow says that my blood is low so she prescribed an Iron pill which has helped, though there's still some type of discharge in the toilet when I urinate and I am still bleeding lightly a couple days every other week, though last month it got a little heavy a couple of days. So, I am feeling better, though I'm still nauseous every morning and lightheaded, no night sweats or chills the last couple of days. I wanted to know you thoughts?

## 2018-10-30 NOTE — Progress Notes (Deleted)
Subjective:    Patient ID: Erin [name], female    DOB: 1966/04/11, 53 y.o.   MRN: [id number]  HPI The patient is here for an acute visit.   Dizziness:      Medications and allergies reviewed with patient and updated if appropriate.  Patient Active Problem List   Diagnosis Date Noted  . Lightheadedness 10/24/2018  . Metrorrhagia 10/24/2018  . Lumbar back pain with radiculopathy affecting left lower extremity 10/07/2018  . Right ovarian cyst   . OSA (obstructive sleep apnea), severe 06/23/2018  . Anxiety 03/05/2018  . Chronic nonintractable headache 03/05/2018  . Hyperlipidemia 01/23/2018  . Elevated liver enzymes   . Diabetes mellitus type 2, uncomplicated ([place]) 73/71/0626  . DKA (diabetic ketoacidoses) ([place]) 01/15/2018  . Gastroesophageal reflux disease 10/05/2017  . Temporal arteritis ([place]) 10/03/2017  . Right foot pain 07/15/2017  . Grief at loss of child 11/02/2016  . Hypertension 09/28/2015  . Morbid obesity ([place]) 05/17/2015  . Umbilical hernia 94/85/4627  . Chronic lower back pain 05/17/2015  . Snoring 05/17/2015  . Palpitations 05/17/2015  . ANEMIA 09/03/2007  . Hypothyroidism following radioiodine therapy 09/02/2007  . INTERSTITIAL CYSTITIS 09/01/2007  . ALLERGIC RHINITIS 08/07/2007  . [place] DISEASE, LUMBAR 08/07/2007    Current Outpatient Medications on File Prior to Visit  Medication Sig Dispense Refill  . cholecalciferol (VITAMIN D) 1000 units tablet Take 1,000 Units by mouth daily.    [name] diltiazem (DILACOR XR) 120 MG 24 hr capsule Take 1 capsule (120 mg total) by mouth daily. 90 capsule 1  . Fe Fum-FePoly-Vit C-Vit B3 (INTEGRA) 62.5-62.5-40-3 MG CAPS Take 1 capsule by mouth daily. 90 capsule 3  . gabapentin (NEURONTIN) 100 MG capsule TAKE 2 CAPSULES (200 MG TOTAL) BY MOUTH AT BEDTIME. 180 capsule 2  . ibuprofen (ADVIL,MOTRIN) 400 MG tablet Take 1 tablet (400 mg total) by mouth every 6 (six) hours as needed for fever, headache or mild pain (use  first). 30 tablet 0  . levonorgestrel (MIRENA, 52 MG,) 20 MCG/24HR IUD 1 each by Intrauterine route once.     [name] losartan (COZAAR) 50 MG tablet Take 1 tablet (50 mg total) by mouth daily. 90 tablet 3  . methocarbamol (ROBAXIN) 500 MG tablet Take 1 tablet (500 mg total) by mouth every 6 (six) hours as needed for muscle spasms. 90 tablet 0  . Multiple Vitamin (MULTIVITAMIN) tablet Take 1 tablet by mouth daily.    . Olopatadine HCl (PATADAY) 0.2 % SOLN Place 1 drop into both eyes daily as needed (itching).     [name] oxyCODONE-acetaminophen (PERCOCET) 5-325 MG tablet Take 1-2 tablets by mouth every 4 (four) hours as needed for severe pain. 40 tablet 0  . Pseudoephedrine-Ibuprofen 30-200 MG TABS Take 2 tablets by mouth every 6 (six) hours as needed (cough, headache, congestion and fever).    [name] UNITHROID 100 MCG tablet TAKE 1 TABLET (100 MCG TOTAL) BY MOUTH DAILY BEFORE BREAKFAST. 45 tablet 2   No current facility-administered medications on file prior to visit.     Past Medical History:  Diagnosis Date  . Abnormal uterine bleeding   . Anemia   . Depression   . Diabetes mellitus without complication (HCC)    gestational  . Elevated blood-pressure reading without diagnosis of hypertension   . Endometriosis   . Fibroids   . GERD (gastroesophageal reflux disease)   . Graves disease   . Grief at loss of child   . H/O gestational diabetes mellitus, not currently pregnant  09/28/2015  . Hypertension   . Hypothyroidism   . Interstitial cystitis   . Menorrhagia   . Oropharyngeal candidiasis 01/16/2018  . OSA (obstructive sleep apnea), severe 06/23/2018  . Premature ventricular contractions    a. rare PVC by monitor 12/2016.  . Right ovarian cyst    3 cm.  . Thyroid disease   . Uterine leiomyoma     Past Surgical History:  Procedure Laterality Date  . APPENDECTOMY    . ARTERY BIOPSY Left 10/25/2017   Procedure: BIOPSY TEMPORAL ARTERY;  Surgeon: Aviva Signs, MD;  Location: [facility];  Service:  General;  Laterality: Left;  . CESAREAN SECTION    . CHOLECYSTECTOMY    . HERNIA REPAIR     incisional  . TUMOR REMOVAL  fibroids    Social History   Socioeconomic History  . Marital status: Married    Spouse name: Not on file  . Number of children: Not on file  . Years of education: Not on file  . Highest education level: Not on file  Occupational History  . Not on file  Social Needs  . Financial resource strain: Not on file  . Food insecurity:    Worry: Not on file    Inability: Not on file  . Transportation needs:    Medical: Not on file    Non-medical: Not on file  Tobacco Use  . Smoking status: Never Smoker  . Smokeless tobacco: Never Used  Substance and Sexual Activity  . Alcohol use: No  . Drug use: No  . Sexual activity: Yes    Birth control/protection: I.U.D.    Comment: Mirena inserted 2015/2016  Lifestyle  . Physical activity:    Days per week: Not on file    Minutes per session: Not on file  . Stress: Not on file  Relationships  . Social connections:    Talks on phone: Not on file    Gets together: Not on file    Attends religious service: Not on file    Active member of club or organization: Not on file    Attends meetings of clubs or organizations: Not on file    Relationship status: Not on file  Other Topics Concern  . Not on file  Social History Narrative  . Not on file    Family History  Problem Relation Age of Onset  . Hypertension Mother   . Cancer Mother   . Breast cancer Mother 21  . Hypertension Father   . Diabetes Father   . Cancer Father   . Ovarian cancer Maternal Aunt   . Thyroid disease Maternal Aunt        hypothyroid    Review of Systems     Objective:  There were no vitals filed for this visit. BP Readings from Last 3 Encounters:  10/18/18 (!) 150/91  09/12/18 134/82  08/29/18 118/70   Wt Readings from Last 3 Encounters:  08/29/18 (!) 344 lb (156 kg)  08/24/18 (!) 332 lb (150.6 kg)  05/15/18 (!) 357 lb  (161.9 kg)   There is no height or weight on file to calculate BMI.   Physical Exam         Assessment & Plan:    See Problem List for Assessment and Plan of chronic medical problems.

## 2018-10-30 NOTE — Telephone Encounter (Signed)
Routing to Dr. Quincy Simmonds to review. Per review of OV notes dated 05/15/18 need f/u PUS for right ovarian cyst.

## 2018-10-30 NOTE — Telephone Encounter (Signed)
Call reviewed with Dr. Quincy Simmonds, call returned to patient. Recommended patient f/u with PCP for further evaluation. If unable to see PCP, ER/Urgent care for further evaluation. Patient verbalizes understanding.   Routing to provider for final review. Patient is agreeable to disposition. Will close encounter.

## 2018-10-30 NOTE — Telephone Encounter (Signed)
I did a chart review for the patient.  I had recommended a hematology oncology consultation last year.  She had an appointment for the beginning of January, 2020 with heme/onc, and she did not keep the appointment.  She has chronic anemia and an elevated WBC.  She can benefit from rescheduling this appointment.   She also needs a pelvic ultrasound recheck of her right ovarian cyst.

## 2018-10-31 ENCOUNTER — Encounter: Payer: Self-pay | Admitting: Internal Medicine

## 2018-10-31 ENCOUNTER — Ambulatory Visit: Payer: BLUE CROSS/BLUE SHIELD | Admitting: Internal Medicine

## 2018-10-31 ENCOUNTER — Other Ambulatory Visit: Payer: Self-pay

## 2018-10-31 ENCOUNTER — Ambulatory Visit (INDEPENDENT_AMBULATORY_CARE_PROVIDER_SITE_OTHER): Payer: BLUE CROSS/BLUE SHIELD | Admitting: Internal Medicine

## 2018-10-31 VITALS — BP 142/84 | HR 93 | Temp 98.9°F | Resp 16 | Ht 65.0 in | Wt 357.0 lb

## 2018-10-31 DIAGNOSIS — R42 Dizziness and giddiness: Secondary | ICD-10-CM | POA: Diagnosis not present

## 2018-10-31 DIAGNOSIS — I10 Essential (primary) hypertension: Secondary | ICD-10-CM

## 2018-10-31 DIAGNOSIS — E890 Postprocedural hypothyroidism: Secondary | ICD-10-CM

## 2018-10-31 DIAGNOSIS — E119 Type 2 diabetes mellitus without complications: Secondary | ICD-10-CM

## 2018-10-31 MED ORDER — METOPROLOL SUCCINATE ER 50 MG PO TB24: 50.0000 mg | ORAL_TABLET | Freq: Every day | ORAL | 1 refills | Status: DC

## 2018-10-31 MED ORDER — ONDANSETRON 4 MG PO TBDP: 4.0000 mg | ORAL_TABLET | Freq: Three times a day (TID) | ORAL | 1 refills | Status: DC | PRN

## 2018-10-31 NOTE — Assessment & Plan Note (Signed)
Blood pressure elevated-not ideally controlled Currently taking losartan 50 mg daily and diltiazem 120 mg daily Her symptoms that she comes in with today do correspond to changing the diltiazem that she is taking and we will discontinue that and start 50 mg daily She will monitor her blood pressure at home over the weekend and let me know in a few days so that we can adjust medication

## 2018-10-31 NOTE — Telephone Encounter (Signed)
Spoke with patient, advised as seen below per Dr. Quincy Simmonds. Patient states she was seen by PCP today for evaluation, agreeable to proceed with f/u with  hematology oncology. Advised this referral was closed, new referral placed to Dr. Irene Limbo in [facility], you will be contacted with appointment details once scheduled. Patient declines to schedule PUS at this time, would like review of benefits prior to scheduling. Advised I will forward to business office for return call. Patient verbalizes understanding and is agreeable.   Routing to provider for final review. Patient is agreeable to disposition. Will close encounter.  Cc: Lerry Liner

## 2018-10-31 NOTE — Assessment & Plan Note (Addendum)
TSH was slightly elevated-she did contact Dr. Cruzita Lederer and she will adjust medication

## 2018-10-31 NOTE — Addendum Note (Signed)
Addended by: Burnice Logan on: 10/31/2018 12:05 PM   Modules accepted: Orders

## 2018-10-31 NOTE — Assessment & Plan Note (Addendum)
Continues to have lightheadedness associated with nausea and possible palpitations intermittently throughout the day.  Occurring daily Blood work that she had done recently does not reveal cause-she still has anemia, but that not much different She did restart the iron pill and that may help a little Lightheadedness unlikely related to blood pressure, sugar It does correspond to changing her diltiazem - will change and monitor her BP and see if her symptoms improve HR normal and regular, but if symptoms persist may consider Holter/cardiology eval

## 2018-10-31 NOTE — Progress Notes (Signed)
Subjective:    Patient ID: Erin [name], female    DOB: 1966/04/14, 53 y.o.   MRN: [id number]  HPI The patient is here for an acute visit.   She is still having lightheadedness and nausea throughout the day - it is intermittent and occurs daily.  There does not seem to be any pattern to when she has the symptoms.  She has had a pulling sensation or palpitations with the nausea.  She denies any chest pain.  Her symptoms tend to be worse at night.  There is no relation to eating and she can eat with the nausea.   She has taken Zofran as needed for the nausea, which has helped.  She has started taking the iron pill.  Yesterday she had forgotten it and she did feel much worse morning she took it and does feel little bit better.  She is taking the muscle relaxer only as needed and has not been taking the gabapentin, but will take it if needed for her back.  She has been taking her blood pressure medications.  Around the time that the symptoms started we did change her diltiazem to a different brand because of cost.  There have not been any other medication changes.    Medications and allergies reviewed with patient and updated if appropriate.  Patient Active Problem List   Diagnosis Date Noted  . Lightheadedness 10/24/2018  . Metrorrhagia 10/24/2018  . Lumbar back pain with radiculopathy affecting left lower extremity 10/07/2018  . Right ovarian cyst   . OSA (obstructive sleep apnea), severe 06/23/2018  . Anxiety 03/05/2018  . Chronic nonintractable headache 03/05/2018  . Hyperlipidemia 01/23/2018  . Elevated liver enzymes   . Diabetes mellitus type 2, uncomplicated ([place]) 79/07/4095  . DKA (diabetic ketoacidoses) ([place]) 01/15/2018  . Gastroesophageal reflux disease 10/05/2017  . Temporal arteritis ([place]) 10/03/2017  . Right foot pain 07/15/2017  . Grief at loss of child 11/02/2016  . Hypertension 09/28/2015  . Morbid obesity ([place]) 05/17/2015  . Umbilical hernia 35/32/9924  .  Chronic lower back pain 05/17/2015  . Snoring 05/17/2015  . Palpitations 05/17/2015  . ANEMIA 09/03/2007  . Hypothyroidism following radioiodine therapy 09/02/2007  . INTERSTITIAL CYSTITIS 09/01/2007  . ALLERGIC RHINITIS 08/07/2007  . [place] DISEASE, LUMBAR 08/07/2007    Current Outpatient Medications on File Prior to Visit  Medication Sig Dispense Refill  . cholecalciferol (VITAMIN D) 1000 units tablet Take 1,000 Units by mouth daily.    [name] diltiazem (DILACOR XR) 120 MG 24 hr capsule Take 1 capsule (120 mg total) by mouth daily. 90 capsule 1  . Fe Fum-FePoly-Vit C-Vit B3 (INTEGRA) 62.5-62.5-40-3 MG CAPS Take 1 capsule by mouth daily. 90 capsule 3  . gabapentin (NEURONTIN) 100 MG capsule TAKE 2 CAPSULES (200 MG TOTAL) BY MOUTH AT BEDTIME. 180 capsule 2  . ibuprofen (ADVIL,MOTRIN) 400 MG tablet Take 1 tablet (400 mg total) by mouth every 6 (six) hours as needed for fever, headache or mild pain (use first). 30 tablet 0  . levonorgestrel (MIRENA, 52 MG,) 20 MCG/24HR IUD 1 each by Intrauterine route once.     [name] losartan (COZAAR) 50 MG tablet Take 1 tablet (50 mg total) by mouth daily. 90 tablet 3  . methocarbamol (ROBAXIN) 500 MG tablet Take 1 tablet (500 mg total) by mouth every 6 (six) hours as needed for muscle spasms. 90 tablet 0  . Multiple Vitamin (MULTIVITAMIN) tablet Take 1 tablet by mouth daily.    . Olopatadine HCl (  PATADAY) 0.2 % SOLN Place 1 drop into both eyes daily as needed (itching).     [name] oxyCODONE-acetaminophen (PERCOCET) 5-325 MG tablet Take 1-2 tablets by mouth every 4 (four) hours as needed for severe pain. 40 tablet 0  . Pseudoephedrine-Ibuprofen 30-200 MG TABS Take 2 tablets by mouth every 6 (six) hours as needed (cough, headache, congestion and fever).    [name] UNITHROID 100 MCG tablet TAKE 1 TABLET (100 MCG TOTAL) BY MOUTH DAILY BEFORE BREAKFAST. 45 tablet 2   No current facility-administered medications on file prior to visit.     Past Medical History:  Diagnosis Date  .  Abnormal uterine bleeding   . Anemia   . Depression   . Diabetes mellitus without complication (HCC)    gestational  . Elevated blood-pressure reading without diagnosis of hypertension   . Endometriosis   . Fibroids   . GERD (gastroesophageal reflux disease)   . Graves disease   . Grief at loss of child   . H/O gestational diabetes mellitus, not currently pregnant 09/28/2015  . Hypertension   . Hypothyroidism   . Interstitial cystitis   . Menorrhagia   . Oropharyngeal candidiasis 01/16/2018  . OSA (obstructive sleep apnea), severe 06/23/2018  . Premature ventricular contractions    a. rare PVC by monitor 12/2016.  . Right ovarian cyst    3 cm.  . Thyroid disease   . Uterine leiomyoma     Past Surgical History:  Procedure Laterality Date  . APPENDECTOMY    . ARTERY BIOPSY Left 10/25/2017   Procedure: BIOPSY TEMPORAL ARTERY;  Surgeon: Aviva Signs, MD;  Location: [facility];  Service: General;  Laterality: Left;  . CESAREAN SECTION    . CHOLECYSTECTOMY    . HERNIA REPAIR     incisional  . TUMOR REMOVAL  fibroids    Social History   Socioeconomic History  . Marital status: Married    Spouse name: Not on file  . Number of children: Not on file  . Years of education: Not on file  . Highest education level: Not on file  Occupational History  . Not on file  Social Needs  . Financial resource strain: Not on file  . Food insecurity:    Worry: Not on file    Inability: Not on file  . Transportation needs:    Medical: Not on file    Non-medical: Not on file  Tobacco Use  . Smoking status: Never Smoker  . Smokeless tobacco: Never Used  Substance and Sexual Activity  . Alcohol use: No  . Drug use: No  . Sexual activity: Yes    Birth control/protection: I.U.D.    Comment: Mirena inserted 2015/2016  Lifestyle  . Physical activity:    Days per week: Not on file    Minutes per session: Not on file  . Stress: Not on file  Relationships  . Social connections:    Talks on  phone: Not on file    Gets together: Not on file    Attends religious service: Not on file    Active member of club or organization: Not on file    Attends meetings of clubs or organizations: Not on file    Relationship status: Not on file  Other Topics Concern  . Not on file  Social History Narrative  . Not on file    Family History  Problem Relation Age of Onset  . Hypertension Mother   . Cancer Mother   . Breast cancer Mother  69  . Hypertension Father   . Diabetes Father   . Cancer Father   . Ovarian cancer Maternal Aunt   . Thyroid disease Maternal Aunt        hypothyroid    Review of Systems  Constitutional: Negative for chills and fever.  Respiratory: Negative for cough, shortness of breath and wheezing.   Cardiovascular: Positive for palpitations (with nausea). Negative for chest pain and leg swelling.  Gastrointestinal: Positive for nausea.       GERD controlled  Neurological: Positive for light-headedness and headaches (pressure comes and goes).       Objective:   Vitals:   10/31/18 1036  BP: (!) 142/84  Pulse: 93  Resp: 16  Temp: 98.9 F (37.2 C)  SpO2: 98%   BP Readings from Last 3 Encounters:  10/31/18 (!) 142/84  10/18/18 (!) 150/91  09/12/18 134/82   Wt Readings from Last 3 Encounters:  10/31/18 (!) 357 lb (161.9 kg)  08/29/18 (!) 344 lb (156 kg)  08/24/18 (!) 332 lb (150.6 kg)   Body mass index is 59.41 kg/m.  Lab Results  Component Value Date   WBC 11.8 (H) 10/24/2018   HGB 10.6 (L) 10/24/2018   HCT 32.7 (L) 10/24/2018   PLT 358.0 10/24/2018   GLUCOSE 137 (H) 10/24/2018   CHOL 224 (H) 01/18/2018   TRIG 145 01/18/2018   HDL 65 01/18/2018   LDLCALC 130 (H) 01/18/2018   ALT 17 10/24/2018   AST 14 10/24/2018   NA 142 10/24/2018   K 3.5 10/24/2018   CL 105 10/24/2018   CREATININE 0.84 10/24/2018   BUN 17 10/24/2018   CO2 29 10/24/2018   TSH 8.72 (H) 10/24/2018   HGBA1C 6.9 (H) 10/24/2018     Physical Exam     Constitutional: Appears well-developed and well-nourished. No distress.  HENT:  Head: Normocephalic and atraumatic.  Neck: Neck supple. No tracheal deviation present. No thyromegaly present.  No cervical lymphadenopathy Cardiovascular: Normal rate, regular rhythm and normal heart sounds.   No murmur heard. No carotid bruit .  No edema Pulmonary/Chest: Effort normal and breath sounds normal. No respiratory distress. No has no wheezes. No rales.  Skin: Skin is warm and dry. Not diaphoretic.  Psychiatric: Normal mood and affect. Behavior is normal.       Assessment & Plan:    See Problem List for Assessment and Plan of chronic medical problems.

## 2018-10-31 NOTE — Patient Instructions (Signed)
   Medications reviewed and updated.  Changes include :   Stop the Diltiazem and replace it with Metoprolol.   Continue the zofran as needed.   Your prescription(s) have been submitted to your pharmacy. Please take as directed and contact our office if you believe you are having problem(s) with the medication(s).  Let me know next week how your blood pressure is and how you are feeling.

## 2018-10-31 NOTE — Assessment & Plan Note (Signed)
Lab Results  Component Value Date   HGBA1C 6.9 (H) 10/24/2018    Recent A1c 6.9%.  This is higher than it was last time we checked it Encouraged her to start exercising regularly when she is feeling better She has gained weight-stressed that she needs to get the weight down Stressed low sugar/carbohydrate diet

## 2018-11-02 ENCOUNTER — Encounter: Payer: Self-pay | Admitting: Internal Medicine

## 2018-11-03 ENCOUNTER — Ambulatory Visit (INDEPENDENT_AMBULATORY_CARE_PROVIDER_SITE_OTHER): Payer: BLUE CROSS/BLUE SHIELD | Admitting: Internal Medicine

## 2018-11-03 ENCOUNTER — Encounter: Payer: Self-pay | Admitting: Internal Medicine

## 2018-11-03 DIAGNOSIS — E890 Postprocedural hypothyroidism: Secondary | ICD-10-CM | POA: Diagnosis not present

## 2018-11-03 MED ORDER — UNITHROID 112 MCG PO TABS: 112.0000 ug | ORAL_TABLET | Freq: Every day | ORAL | 5 refills | Status: DC

## 2018-11-03 NOTE — Progress Notes (Signed)
Patient ID: Erin [name], female   DOB: May 11, 1966, 53 y.o.   MRN: [id number]   Patient location: Home My location: Office  Referring Provider: Binnie Rail, MD  I connected with the patient on 11/03/18 at  10:30 AM EDT by a video enabled telemedicine application and verified that I am speaking with the correct person.   I discussed the limitations of evaluation and management by telemedicine and the availability of in person appointments. The patient expressed understanding and agreed to proceed.   Details of the encounter are shown below.  HPI  Erin [name] is a 53 y.o.-year-old female, initially referred by her PCP, Dr. Quay Burow, presenting for follow-up for postablative hypothyroidism.  She previously saw Dr. Dwyane Dee.  Last visit with me 7 months ago.  She started to feel dizzy and have nausea after changing one of her BP meds.  She contacted me through my chart to see if the symptoms could be due to her thyroid medication.  I advised her that this is unlikely.  She had a visit with PCP 10 days ago and her blood pressure medication was changed at that time.  She was found to be anemic and she was started on iron.  She started to feel much better afterwards.  However, she still had a little nausea and dizziness and schedule the appointment today.  Reviewed and addended history: Pt. has been dx with hypothyroidism after RAI treatment for Graves' disease in 08/2008 >> on Unithroid d.a.w.. She had a lot of variability in the TFTs on generic LT4.  At last visit, she was not taking the medication correctly and I advised her how to take it correctly.  Since he made significant changes in how she was taking the medication, I also decreased her dose of levothyroxine from 125 to 100 mcg daily.  Pt is now on levothyroxine 100 mcg daily, taken: - Daily: No missed doses - in am - fasting - at least 30 min from b'fast or coffee and also BP meds - no Ca, PPIs (stopped) - + Fe, MVI 4h after LT4 - +  on Biotin (not on 10/24/2018)  Patient's TFTs were abnormal recently: Lab Results  Component Value Date   TSH 8.72 (H) 10/24/2018   TSH 1.34 05/16/2018   TSH 0.43 03/13/2018   TSH 1.06 01/29/2018   TSH 0.288 (L) 01/15/2018   TSH 0.14 (L) 11/27/2017   TSH 0.09 (L) 08/27/2017   TSH 18.55 (H) 06/27/2017   TSH 0.16 (L) 10/29/2016   TSH 10.45 (H) 02/14/2016   FREET4 0.83 05/16/2018   FREET4 1.14 11/27/2017   FREET4 1.11 08/27/2017   FREET4 0.67 06/27/2017   FREET4 1.17 10/29/2016   FREET4 0.72 02/14/2016   FREET4 1.07 07/25/2015   FREET4 0.81 11/17/2014   FREET4 0.75 08/03/2014   FREET4 0.68 12/01/2013   T3FREE 6.7 (H) 09/04/2007   Antithyroid antibodies: No results found for: THGAB No components found for: TPOAB  Pt denies: - feeling nodules in neck - hoarseness - dysphagia - choking - SOB with lying down  She has + FH of thyroid disorders in: mother, M aunts. No FH of thyroid cancer. No h/o radiation tx to head or neck other than RAI treatment.  No herbal supplements. No recent steroids use.   Pt. also has a history of uncontrolled diabetes, with latest HbA1c improved at 10.3%.  She has a history of DKA in 12/2017.  This is currently managed by PCP.  She was on high doses  on Prednisone (60 mg) >> 2.5 mg now. HbA1c coming down.  ROS: Constitutional: ++ weight gain/no weight loss, no fatigue, no subjective hyperthermia, no subjective hypothermia Eyes: no blurry vision, no xerophthalmia ENT: no sore throat, + see HPI Cardiovascular: no CP/no SOB/no palpitations/no leg swelling Respiratory: no cough/no SOB/no wheezing Gastrointestinal: + N/no V/no D/no C/no acid reflux Musculoskeletal: no muscle aches/no joint aches Skin: no rashes, no hair loss Neurological: no tremors/no numbness/no tingling/+ dizziness  I reviewed pt's medications, allergies, PMH, social hx, family hx, and changes were documented in the history of present illness. Otherwise, unchanged from my  initial visit note.  Past Medical History:  Diagnosis Date  . Abnormal uterine bleeding   . Anemia   . Depression   . Diabetes mellitus without complication (HCC)    gestational  . Elevated blood-pressure reading without diagnosis of hypertension   . Endometriosis   . Fibroids   . GERD (gastroesophageal reflux disease)   . Graves disease   . Grief at loss of child   . H/O gestational diabetes mellitus, not currently pregnant 09/28/2015  . Hypertension   . Hypothyroidism   . Interstitial cystitis   . Menorrhagia   . Oropharyngeal candidiasis 01/16/2018  . OSA (obstructive sleep apnea), severe 06/23/2018  . Premature ventricular contractions    a. rare PVC by monitor 12/2016.  . Right ovarian cyst    3 cm.  . Thyroid disease   . Uterine leiomyoma    Past Surgical History:  Procedure Laterality Date  . APPENDECTOMY    . ARTERY BIOPSY Left 10/25/2017   Procedure: BIOPSY TEMPORAL ARTERY;  Surgeon: Aviva Signs, MD;  Location: [facility];  Service: General;  Laterality: Left;  . CESAREAN SECTION    . CHOLECYSTECTOMY    . HERNIA REPAIR     incisional  . TUMOR REMOVAL  fibroids   Social History   Socioeconomic History  . Marital status: Married    Spouse name: Not on file  . Number of children: Not on file  . Years of education: Not on file  . Highest education level: Not on file  Occupational History  . Not on file  Social Needs  . Financial resource strain: Not on file  . Food insecurity:    Worry: Not on file    Inability: Not on file  . Transportation needs:    Medical: Not on file    Non-medical: Not on file  Tobacco Use  . Smoking status: Never Smoker  . Smokeless tobacco: Never Used  Substance and Sexual Activity  . Alcohol use: No  . Drug use: No  . Sexual activity: Yes    Birth control/protection: I.U.D.    Comment: Mirena inserted 2015/2016  Lifestyle  . Physical activity:    Days per week: Not on file    Minutes per session: Not on file  . Stress: Not  on file  Relationships  . Social connections:    Talks on phone: Not on file    Gets together: Not on file    Attends religious service: Not on file    Active member of club or organization: Not on file    Attends meetings of clubs or organizations: Not on file    Relationship status: Not on file  . Intimate partner violence:    Fear of current or ex partner: Not on file    Emotionally abused: Not on file    Physically abused: Not on file    Forced sexual activity:  Not on file  Other Topics Concern  . Not on file  Social History Narrative  . Not on file   Current Outpatient Medications on File Prior to Visit  Medication Sig Dispense Refill  . cholecalciferol (VITAMIN D) 1000 units tablet Take 1,000 Units by mouth daily.    . Fe Fum-FePoly-Vit C-Vit B3 (INTEGRA) 62.5-62.5-40-3 MG CAPS Take 1 capsule by mouth daily. 90 capsule 3  . gabapentin (NEURONTIN) 100 MG capsule TAKE 2 CAPSULES (200 MG TOTAL) BY MOUTH AT BEDTIME. 180 capsule 2  . ibuprofen (ADVIL,MOTRIN) 400 MG tablet Take 1 tablet (400 mg total) by mouth every 6 (six) hours as needed for fever, headache or mild pain (use first). 30 tablet 0  . levonorgestrel (MIRENA, 52 MG,) 20 MCG/24HR IUD 1 each by Intrauterine route once.     [name] losartan (COZAAR) 50 MG tablet Take 1 tablet (50 mg total) by mouth daily. 90 tablet 3  . methocarbamol (ROBAXIN) 500 MG tablet Take 1 tablet (500 mg total) by mouth every 6 (six) hours as needed for muscle spasms. 90 tablet 0  . metoprolol succinate (TOPROL-XL) 50 MG 24 hr tablet Take 1 tablet (50 mg total) by mouth daily. Take with or immediately following a meal. 90 tablet 1  . Multiple Vitamin (MULTIVITAMIN) tablet Take 1 tablet by mouth daily.    . Olopatadine HCl (PATADAY) 0.2 % SOLN Place 1 drop into both eyes daily as needed (itching).     . ondansetron (ZOFRAN ODT) 4 MG disintegrating tablet Take 1 tablet (4 mg total) by mouth every 8 (eight) hours as needed for nausea or vomiting. 40 tablet 1   . Pseudoephedrine-Ibuprofen 30-200 MG TABS Take 2 tablets by mouth every 6 (six) hours as needed (cough, headache, congestion and fever).    [name] UNITHROID 100 MCG tablet TAKE 1 TABLET (100 MCG TOTAL) BY MOUTH DAILY BEFORE BREAKFAST. 45 tablet 2   No current facility-administered medications on file prior to visit.    Allergies  Allergen Reactions  . Prednisone Other (See Comments)    Raises blood sugar so high that she receives intensive care  . Prozac [Fluoxetine Hcl] Nausea Only  . Hydrocodone Hives, Itching and Rash    On thighs    Family History  Problem Relation Age of Onset  . Hypertension Mother   . Cancer Mother   . Breast cancer Mother 50  . Hypertension Father   . Diabetes Father   . Cancer Father   . Ovarian cancer Maternal Aunt   . Thyroid disease Maternal Aunt        hypothyroid   PE: There were no vitals taken for this visit. Wt Readings from Last 3 Encounters:  10/31/18 (!) 357 lb (161.9 kg)  08/29/18 (!) 344 lb (156 kg)  08/24/18 (!) 332 lb (150.6 kg)   Constitutional:  in NAD  The physical exam was not performed (virtual visit).  ASSESSMENT: 1. Hypothyroidism - After RAI treatment for Graves' disease  2. 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 Patient with longstanding, uncontrolled, hypothyroidism, on levothyroxine d.a.w. therapy.  At last visit, we optimized how she was taking her levothyroxine by moving this first thing in the morning and waiting 30 minutes and eating breakfast or drinking coffee with creamer.  We also moved her blood pressure medicines with breakfast as she was taking them along with her levothyroxine.  I also advised her to take the PPI 30 minutes before lunch.  At that time, since we optimized how  she was taking the levothyroxine, I also advised her to decrease the dose from 125 mcg to 100 mcg daily.  Subsequent TFTs were normal.   - She started to experience nausea and dizziness approximately 2 months ago.  She recently saw her PCP and her  blood pressure medication was changed.  She was also found to be anemic and she was started on iron.  She is taking this correctly, 4 hours after levothyroxine.  TSH checked at the same time was slightly high, at 8.  She is now feeling better, with only a little nausea and dizziness. - we discussed about taking the thyroid hormone every day, with water, >30 minutes before breakfast, separated by >4 hours from acid reflux medications, calcium, iron, multivitamins. Pt. is taking it correctly now. -At this visit, we discussed that her significant weight gain of more than 20 pounds in the last 2 months could have influenced her requirement for levothyroxine.  We will go ahead and increase her levothyroxine dose and recheck her tests in the 1.5 months. - RTC in 6 mo, but in 1.5 mo for labs.  2. 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 -She is currently off steroids. - latest HbA1c was slightly higher, but still at goal: Lab Results  Component Value Date   HGBA1C 6.9 (H) 10/24/2018   HGBA1C 6.2 (A) 08/29/2018   HGBA1C 10.3 (H) 03/13/2018    Patient Instructions  Please increase the Unithroid dose to 112 mcg daily.  Take the thyroid hormone every day, with water, at least 30 minutes before breakfast, separated by at least 4 hours from: - acid reflux medications - calcium - iron - multivitamins  Come back for labs in 5 weeks.  Please come back for a follow-up appointment in 6 months.   Philemon Kingdom, MD PhD [facility]

## 2018-11-03 NOTE — Patient Instructions (Signed)
Please increase the Unithroid dose to 112 mcg daily.  Take the thyroid hormone every day, with water, at least 30 minutes before breakfast, separated by at least 4 hours from: - acid reflux medications - calcium - iron - multivitamins  Come back for labs in 5 weeks.  Please come back for a follow-up appointment in 6 months.

## 2018-11-04 MED ORDER — DILTIAZEM HCL ER COATED BEADS 120 MG PO TB24: 120.0000 mg | ORAL_TABLET | Freq: Every day | ORAL | 1 refills | Status: DC

## 2018-11-04 NOTE — Addendum Note (Signed)
Addended by: Binnie Rail on: 11/04/2018 02:26 PM   Modules accepted: Orders

## 2018-11-05 ENCOUNTER — Ambulatory Visit (INDEPENDENT_AMBULATORY_CARE_PROVIDER_SITE_OTHER): Payer: BLUE CROSS/BLUE SHIELD | Admitting: Neurology

## 2018-11-05 ENCOUNTER — Encounter: Payer: Self-pay | Admitting: Neurology

## 2018-11-05 ENCOUNTER — Other Ambulatory Visit: Payer: Self-pay

## 2018-11-05 ENCOUNTER — Telehealth: Payer: Self-pay | Admitting: Obstetrics and Gynecology

## 2018-11-05 ENCOUNTER — Telehealth: Payer: Self-pay

## 2018-11-05 DIAGNOSIS — Z9989 Dependence on other enabling machines and devices: Secondary | ICD-10-CM | POA: Diagnosis not present

## 2018-11-05 DIAGNOSIS — G4733 Obstructive sleep apnea (adult) (pediatric): Secondary | ICD-10-CM | POA: Diagnosis not present

## 2018-11-05 NOTE — Patient Instructions (Signed)
Given verbally, during today's virtual video-based encounter, with verbal feedback received.   

## 2018-11-05 NOTE — Telephone Encounter (Signed)
Call placed to patient to review benefits and schedule recommended ultrasound. Patient understood information presented, but had additional questions. Will review with SallyYeakley, RN and return call to patient   cc: Lamont Snowball, RN

## 2018-11-05 NOTE — Progress Notes (Signed)
Interim history:  Erin [name] is a 53 year old right-handed woman with an underlying complex medical history of hypothyroidism secondary to radioactive iodine treatment for history of Graves disease, temporal arteritis, hypertension, reflux disease, diabetes, recurrent headaches, low back pain, palpitation, allergic rhinitis, interstitial cystitis, anemia, and morbid obesity with a BMI of over 55, who presents for a virtual, video based appointment via [email or web address] for follow-up consultation of her obstructive sleep apnea, after recent testing and starting AutoPap therapy.  The patient is unaccompanied today and joins via cell ph from her home, I am located in my office.  I first met her on 05/01/2018 at the request of her primary care physician, at which time she reported snoring, witnessed apneas, daytime somnolence, and morning headaches.  She was advised to proceed with sleep study testing.  Her insurance denied a laboratory attended sleep study.  She had a home sleep test on 06/11/2018 which indicated severe obstructive sleep apnea with an AHI of 57.3/h, O2 nadir of 73%.  She was advised to proceed with AutoPap therapy.  She messaged in the interim through my chart earlier in May 2020 complaining of lightheadedness, dizziness and nausea.  She had been utilizing Zofran for nausea as needed.  She felt a little better after she started the iron pill.  She was wondering if the AutoPap was contributing to some of her symptoms.  She was offered a sooner appointment to discuss further.   Today, 11/05/2018: Please also see below for virtual visit documentation.  I reviewed her AutoPAP compliance data from 10/05/2018 through 11/03/2018 which is the past 30 days, during which time she used her machine 26 days with percent used days greater than 4 hours at 60%, indicating slightly suboptimal compliance with an average usage of 5 hours and 0 minutes, residual AHI at goal at 0.6/h, leak on the higher end with a 95th  percentile at 18.4 L/min with a 95th percentile of pressure at 13.1 cm with a range of 7 cm to maximum 14 cm with EPR.  Her set up date was 09/29/2018.   Previously:   05/01/2018: (She) reports snoring and excessive daytime somnolence. I reviewed your office note from 03/05/2018. She has woken up with a sense of gasping for air. She has had witnessed apneas per family. She has no known family history of OSA. She has had occasional morning headaches but denies night to night nocturia. She has had weight gain. Bedtime is between 11 and 11:30 and rise time around 5:30. She typically takes a nap in the afternoons. She is a nonsmoker and does not drink caffeine or utilize alcohol. Her Epworth sleepiness score is 8 out of 24 today, fatigue score is 37 out of 63. She lives at home with her husband and 7 children. She facilitates homeschooling for fourth graders. She has had palpitations especially since she was diagnosed with thyroid disease some 10 years ago. She recently tapered off of prednisone.   Her Past Medical History Is Significant For: Past Medical History:  Diagnosis Date   Abnormal uterine bleeding    Anemia    Depression    Diabetes mellitus without complication (HCC)    gestational   Elevated blood-pressure reading without diagnosis of hypertension    Endometriosis    Fibroids    GERD (gastroesophageal reflux disease)    Graves disease    Grief at loss of child    H/O gestational diabetes mellitus, not currently pregnant 09/28/2015   Hypertension    Hypothyroidism  Interstitial cystitis    Menorrhagia    Oropharyngeal candidiasis 01/16/2018   OSA (obstructive sleep apnea), severe 06/23/2018   Premature ventricular contractions    a. rare PVC by monitor 12/2016.   Right ovarian cyst    3 cm.   Thyroid disease    Uterine leiomyoma     Her Past Surgical History Is Significant For: Past Surgical History:  Procedure Laterality Date   APPENDECTOMY     ARTERY  BIOPSY Left 10/25/2017   Procedure: BIOPSY TEMPORAL ARTERY;  Surgeon: Aviva Signs, MD;  Location: [facility];  Service: General;  Laterality: Left;   CESAREAN SECTION     CHOLECYSTECTOMY     HERNIA REPAIR     incisional   TUMOR REMOVAL  fibroids    Her Family History Is Significant For: Family History  Problem Relation Age of Onset   Hypertension Mother    Cancer Mother    Breast cancer Mother 60   Hypertension Father    Diabetes Father    Cancer Father    Ovarian cancer Maternal Aunt    Thyroid disease Maternal Aunt        hypothyroid    Her Social History Is Significant For: Social History   Socioeconomic History   Marital status: Married    Spouse name: Not on file   Number of children: Not on file   Years of education: Not on file   Highest education level: Not on file  Occupational History   Not on file  Social Needs   Financial resource strain: Not on file   Food insecurity:    Worry: Not on file    Inability: Not on file   Transportation needs:    Medical: Not on file    Non-medical: Not on file  Tobacco Use   Smoking status: Never Smoker   Smokeless tobacco: Never Used  Substance and Sexual Activity   Alcohol use: No   Drug use: No   Sexual activity: Yes    Birth control/protection: I.U.D.    Comment: Mirena inserted 2015/2016  Lifestyle   Physical activity:    Days per week: Not on file    Minutes per session: Not on file   Stress: Not on file  Relationships   Social connections:    Talks on phone: Not on file    Gets together: Not on file    Attends religious service: Not on file    Active member of club or organization: Not on file    Attends meetings of clubs or organizations: Not on file    Relationship status: Not on file  Other Topics Concern   Not on file  Social History Narrative   Not on file    Her Allergies Are:  Allergies  Allergen Reactions   Prednisone Other (See Comments)    Raises blood sugar  so high that she receives intensive care   Prozac [Fluoxetine Hcl] Nausea Only   Hydrocodone Hives, Itching and Rash    On thighs   :   Her Current Medications Are:  Outpatient Encounter Medications as of 11/05/2018  Medication Sig   cholecalciferol (VITAMIN D) 1000 units tablet Take 1,000 Units by mouth daily.   diltiazem (CARDIZEM LA) 120 MG 24 hr tablet Take 1 tablet (120 mg total) by mouth daily.   Fe Fum-FePoly-Vit C-Vit B3 (INTEGRA) 62.5-62.5-40-3 MG CAPS Take 1 capsule by mouth daily.   gabapentin (NEURONTIN) 100 MG capsule TAKE 2 CAPSULES (200 MG TOTAL) BY MOUTH AT  BEDTIME.   ibuprofen (ADVIL,MOTRIN) 400 MG tablet Take 1 tablet (400 mg total) by mouth every 6 (six) hours as needed for fever, headache or mild pain (use first).   levonorgestrel (MIRENA, 52 MG,) 20 MCG/24HR IUD 1 each by Intrauterine route once.    losartan (COZAAR) 50 MG tablet Take 1 tablet (50 mg total) by mouth daily.   methocarbamol (ROBAXIN) 500 MG tablet Take 1 tablet (500 mg total) by mouth every 6 (six) hours as needed for muscle spasms.   Multiple Vitamin (MULTIVITAMIN) tablet Take 1 tablet by mouth daily.   Olopatadine HCl (PATADAY) 0.2 % SOLN Place 1 drop into both eyes daily as needed (itching).    ondansetron (ZOFRAN ODT) 4 MG disintegrating tablet Take 1 tablet (4 mg total) by mouth every 8 (eight) hours as needed for nausea or vomiting.   Pseudoephedrine-Ibuprofen 30-200 MG TABS Take 2 tablets by mouth every 6 (six) hours as needed (cough, headache, congestion and fever).   UNITHROID 112 MCG tablet Take 1 tablet (112 mcg total) by mouth daily before breakfast.   No facility-administered encounter medications on file as of 11/05/2018.   :  Review of Systems:  Out of a complete 14 point review of systems, all are reviewed and negative with the exception of these symptoms as listed below:  Virtual Visit via Video Note on 11/05/2018: I connected with Erin [name] on 11/05/18 at  2:30 PM EDT by  a video enabled telemedicine application and verified that I am speaking with the correct person using two identifiers.   I discussed the limitations of evaluation and management by telemedicine and the availability of in person appointments. The patient expressed understanding and agreed to proceed.  History of Present Illness:  She reports Doing a little bit better, still has episodes of dizziness and nausea, feels sweaty first thing in the morning, almost like having more night sweats.  She does report that the air coming in from the AutoPap machine feels hot when she wakes up but starts of cooler.  She has not readjusted the humidity degree and the temperature.  She is willing to talk to her DME provider about readjusting those levels.  She was told by her primary care physician to stop the metoprolol and restart Cardizem, she just did that today.  She is motivated to continue with the AutoPap.  She overall feels a little better.  She is encouraged by it.  She tries to hydrate well with water.  She is using a medium F 20 fullface mask from [employer].  Her DME company is [employer].  She was seen by primary care recently and had recent changes in her medication for blood pressure, was started on an iron pill for iron deficiency and anemia.  She also recently had a virtual visit with her endocrinologist and had an increase in her thyroid medication.  She was noted to have some weight gain.   Observations/Objective: The most recent vital signs available for my review in her chart are from 10/31/2018: Blood pressure 142/84, pulse 93, temperature 98.9, weight 357 pounds for a BMI of 59.41. On examination, she is very pleasant and conversant, in no acute distress.  Good comprehension and language skills are noted.  Speech is clear without dysarthria, hypophonia or voice tremor.  Extraocular movements are well preserved.  Airway examination reveals mild mouth dryness, otherwise stable findings, tongue protrudes  centrally in palate elevates symmetrically.  Face is symmetric with normal facial animation.  Shoulder height is equal,  upper body movements and upper extremity coordination well-preserved.   Assessment and Plan: In summary, Erin [name] is a very pleasant 53 year old female with an underlying complex medical history of hypothyroidism secondary to radioactive iodine treatment for history of Graves disease, temporal arteritis, hypertension, reflux disease, diabetes, recurrent headaches, low back pain, palpitation, allergic rhinitis, interstitial cystitis and morbid obesity with a BMI of over 55, who Presents for a virtual, video based appointment via [email or web address] for follow-up consultation of her severe obstructive sleep apnea as determined by a home sleep test on 06/11/2018.  She has established treatment with an AutoPap machine, set up date was 09/29/2018.  She is struggling with tolerance of the AutoPap but has done and overall good job using her AutoPap machine, her apnea scores are at goal with less than 1/h on average with consistent usage.  Her compliance for more than 4 hours is not quite at 70% and she is strongly encouraged to work on it.  She is motivated to continue, has overall done a good job and actually does feel a little better recently.  She has been struggling with intermittent nausea, dizziness, and sweating at night.  She may have a chance to improve the sweating at night from the sleep apnea treatment standpoint by reducing the humidity content and also the humidifier temperature.  She is encouraged to talk to her DME provider about talking her through the process of changing the settings in that regard.  She is commended for her treatment adherence thus far, she is encouraged to maintain AutoPap therapy, she is advised of her home sleep test results and we went over her compliance data in detail today.  She is advised to follow-up with the nurse practitioner in about 6 months, sooner if needed.   She is reminded about the importance of treating severe obstructive sleep apnea in the long-term benefits of using AutoPap therapy hopefully achieving long-term improvements and risk factor reduction for cardiovascular disease, overall symptom control and she is advised that it can take a few months to get fully used to using an AutoPap or CPAP machine.She is encouraged to call or email [place] through [place] for any interim questions or concerns.  I answered all her questions today and she was in agreement.   Follow Up Instructions:    I discussed the assessment and treatment plan with the patient. The patient was provided an opportunity to ask questions and all were answered. The patient agreed with the plan and demonstrated an understanding of the instructions.   The patient was advised to call back or seek an in-person evaluation if the symptoms worsen or if the condition fails to improve as anticipated.  I provided 15 minutes of non-face-to-face time during this encounter.   Star Age, MD

## 2018-11-05 NOTE — Telephone Encounter (Signed)
Call placed to patient, additional questions were answered, see account notes. Patient is scheduled for an ultrasound on 11/13/2018 with Dr Quincy Simmonds. Patient is aware of the appointment date, arrival time, no visitor policy and policy regarding mask. Patient is also aware of the cancellation policy. Patient had no further questions.   Forwarding to Dr Quincy Simmonds for final review. Patient agreeable to disposition. Will close encounter  cc: Lamont Snowball, RN

## 2018-11-05 NOTE — Telephone Encounter (Signed)
I called pt, scheduled her 6 mo follow up. Pt verbalized understanding of new appt date and time.

## 2018-11-11 ENCOUNTER — Telehealth: Payer: Self-pay | Admitting: Internal Medicine

## 2018-11-11 ENCOUNTER — Other Ambulatory Visit: Payer: Self-pay

## 2018-11-11 NOTE — Telephone Encounter (Signed)
Pt has been cld and schedule to see Dr. Walden Field on 6/11 at 1pm. Pt aware to arrive 15 minutes early.

## 2018-11-13 ENCOUNTER — Encounter: Payer: Self-pay | Admitting: Obstetrics and Gynecology

## 2018-11-13 ENCOUNTER — Ambulatory Visit (INDEPENDENT_AMBULATORY_CARE_PROVIDER_SITE_OTHER): Payer: BLUE CROSS/BLUE SHIELD

## 2018-11-13 ENCOUNTER — Other Ambulatory Visit: Payer: Self-pay

## 2018-11-13 ENCOUNTER — Ambulatory Visit (INDEPENDENT_AMBULATORY_CARE_PROVIDER_SITE_OTHER): Payer: BLUE CROSS/BLUE SHIELD | Admitting: Obstetrics and Gynecology

## 2018-11-13 ENCOUNTER — Telehealth: Payer: Self-pay | Admitting: Family

## 2018-11-13 VITALS — BP 122/70 | HR 84 | Temp 97.6°F | Resp 14 | Ht 65.0 in | Wt 357.0 lb

## 2018-11-13 DIAGNOSIS — N83201 Unspecified ovarian cyst, right side: Secondary | ICD-10-CM

## 2018-11-13 DIAGNOSIS — N939 Abnormal uterine and vaginal bleeding, unspecified: Secondary | ICD-10-CM

## 2018-11-13 NOTE — Progress Notes (Signed)
GYNECOLOGY  VISIT   HPI: 53 y.o.   Married  [place] American  female   G7P0 with Patient's last menstrual period was 10/27/2018 (within days).   here for ultrasound   For recheck of right ovarian cyst and follow up of bleeding and anemia.   She has a Mirena IUD.  Benign proliferative endometrium on EMB with Dr. Leo Grosser in 2017.   Prior US 05/15/18 showed a small 13 mm fibroid and a 30 mm thin walled right ovarian cyst with a few scattered echoes.   Menses heavier in May and actually needed to wear a pad.  She changed her pad twice a day.  She states she has constant spotting.  Reports she has bleeding about 2 - 3 days a week but not every week.  She is having a hard time quantifying how many days she bleeds.  She notices something in the tissue in the toilet every time she urinates.   No vaginal itching, burning, or odor.   Denies pelvic pain.   I referred her back to oncology for her chronic anemia.  She cancelled the appointment that was previously made.  She has been seeing her PCP for malaise.  She has had night sweats for a long time.  Hgb 10.6 on 10/24/18.  She uses CPAP for severe sleep apnea.   GYNECOLOGIC HISTORY: Patient's last menstrual period was 10/27/2018 (within days). Contraception:  Tubal/Mirena - IUD placed 01/12/16.  Menopausal hormone therapy:  none Last mammogram:  2019 per patient --normal  Last pap smear:   05-02-18 Neg:Neg HR HPV, 2017 Neg per patient        OB History    Gravida  7   Para      Term      Preterm      AB      Living  7     SAB      TAB      Ectopic      Multiple      Live Births                 Patient Active Problem List   Diagnosis Date Noted  . Lightheadedness 10/24/2018  . Metrorrhagia 10/24/2018  . Lumbar back pain with radiculopathy affecting left lower extremity 10/07/2018  . Right ovarian cyst   . OSA (obstructive sleep apnea), severe 06/23/2018  . Anxiety 03/05/2018  . Chronic nonintractable  headache 03/05/2018  . Hyperlipidemia 01/23/2018  . Elevated liver enzymes   . Diabetes mellitus type 2, uncomplicated ([place]) 17/40/8144  . DKA (diabetic ketoacidoses) ([place]) 01/15/2018  . Gastroesophageal reflux disease 10/05/2017  . Temporal arteritis ([place]) 10/03/2017  . Right foot pain 07/15/2017  . Grief at loss of child 11/02/2016  . Hypertension 09/28/2015  . Morbid obesity ([place]) 05/17/2015  . Umbilical hernia 81/85/6314  . Chronic lower back pain 05/17/2015  . Snoring 05/17/2015  . Palpitations 05/17/2015  . ANEMIA 09/03/2007  . Hypothyroidism following radioiodine therapy 09/02/2007  . INTERSTITIAL CYSTITIS 09/01/2007  . ALLERGIC RHINITIS 08/07/2007  . [place] DISEASE, LUMBAR 08/07/2007    Past Medical History:  Diagnosis Date  . Abnormal uterine bleeding   . Anemia   . Depression   . Diabetes mellitus without complication (HCC)    gestational  . Elevated blood-pressure reading without diagnosis of hypertension   . Endometriosis   . Fibroids   . GERD (gastroesophageal reflux disease)   . Graves disease   . Grief at loss of child   .  H/O gestational diabetes mellitus, not currently pregnant 09/28/2015  . Hypertension   . Hypothyroidism   . Interstitial cystitis   . Menorrhagia   . Oropharyngeal candidiasis 01/16/2018  . OSA (obstructive sleep apnea), severe 06/23/2018  . Premature ventricular contractions    a. rare PVC by monitor 12/2016.  . Right ovarian cyst    3 cm.  . Thyroid disease   . Uterine leiomyoma     Past Surgical History:  Procedure Laterality Date  . APPENDECTOMY    . ARTERY BIOPSY Left 10/25/2017   Procedure: BIOPSY TEMPORAL ARTERY;  Surgeon: Aviva Signs, MD;  Location: [facility];  Service: General;  Laterality: Left;  . CESAREAN SECTION    . CHOLECYSTECTOMY    . HERNIA REPAIR     incisional  . TUMOR REMOVAL  fibroids    Current Outpatient Medications  Medication Sig Dispense Refill  . cholecalciferol (VITAMIN D) 1000 units tablet Take 1,000  Units by mouth daily.    [name] diltiazem (CARDIZEM LA) 120 MG 24 hr tablet Take 1 tablet (120 mg total) by mouth daily. 90 tablet 1  . Fe Fum-FePoly-Vit C-Vit B3 (INTEGRA) 62.5-62.5-40-3 MG CAPS Take 1 capsule by mouth daily. 90 capsule 3  . gabapentin (NEURONTIN) 100 MG capsule TAKE 2 CAPSULES (200 MG TOTAL) BY MOUTH AT BEDTIME. 180 capsule 2  . ibuprofen (ADVIL,MOTRIN) 400 MG tablet Take 1 tablet (400 mg total) by mouth every 6 (six) hours as needed for fever, headache or mild pain (use first). 30 tablet 0  . levonorgestrel (MIRENA, 52 MG,) 20 MCG/24HR IUD 1 each by Intrauterine route once.     [name] losartan (COZAAR) 50 MG tablet Take 1 tablet (50 mg total) by mouth daily. 90 tablet 3  . methocarbamol (ROBAXIN) 500 MG tablet Take 1 tablet (500 mg total) by mouth every 6 (six) hours as needed for muscle spasms. 90 tablet 0  . Multiple Vitamin (MULTIVITAMIN) tablet Take 1 tablet by mouth daily.    . Olopatadine HCl (PATADAY) 0.2 % SOLN Place 1 drop into both eyes daily as needed (itching).     . ondansetron (ZOFRAN ODT) 4 MG disintegrating tablet Take 1 tablet (4 mg total) by mouth every 8 (eight) hours as needed for nausea or vomiting. 40 tablet 1  . Pseudoephedrine-Ibuprofen 30-200 MG TABS Take 2 tablets by mouth every 6 (six) hours as needed (cough, headache, congestion and fever).    [name] UNITHROID 112 MCG tablet Take 1 tablet (112 mcg total) by mouth daily before breakfast. 45 tablet 5   No current facility-administered medications for this visit.      ALLERGIES: Prednisone; Prozac [fluoxetine hcl]; and Hydrocodone  Family History  Problem Relation Age of Onset  . Hypertension Mother   . Cancer Mother   . Breast cancer Mother 37  . Hypertension Father   . Diabetes Father   . Cancer Father   . Ovarian cancer Maternal Aunt   . Thyroid disease Maternal Aunt        hypothyroid    Social History   Socioeconomic History  . Marital status: Married    Spouse name: Not on file  . Number of  children: Not on file  . Years of education: Not on file  . Highest education level: Not on file  Occupational History  . Not on file  Social Needs  . Financial resource strain: Not on file  . Food insecurity:    Worry: Not on file    Inability: Not on file  .  Transportation needs:    Medical: Not on file    Non-medical: Not on file  Tobacco Use  . Smoking status: Never Smoker  . Smokeless tobacco: Never Used  Substance and Sexual Activity  . Alcohol use: No  . Drug use: No  . Sexual activity: Yes    Birth control/protection: I.U.D.    Comment: Mirena inserted 2015/2016  Lifestyle  . Physical activity:    Days per week: Not on file    Minutes per session: Not on file  . Stress: Not on file  Relationships  . Social connections:    Talks on phone: Not on file    Gets together: Not on file    Attends religious service: Not on file    Active member of club or organization: Not on file    Attends meetings of clubs or organizations: Not on file    Relationship status: Not on file  . Intimate partner violence:    Fear of current or ex partner: Not on file    Emotionally abused: Not on file    Physically abused: Not on file    Forced sexual activity: Not on file  Other Topics Concern  . Not on file  Social History Narrative  . Not on file    Review of Systems  Constitutional: Negative.   HENT: Negative.   Eyes: Negative.   Respiratory: Negative.   Cardiovascular: Negative.   Gastrointestinal: Negative.   Endocrine: Negative.   Genitourinary: Negative.   Musculoskeletal: Negative.   Skin: Negative.   Allergic/Immunologic: Negative.   Neurological: Negative.   Hematological: Negative.   Psychiatric/Behavioral: Negative.     PHYSICAL EXAMINATION:    Wt (!) 357 lb (161.9 kg)   LMP 10/27/2018 (Within Days)   BMI 59.41 kg/m     General appearance: alert, cooperative and appears stated age   Pelvic US Uterus with no myometrial masses.  IUD in endometrial  canal.  EMS 5.13 mm. Right ovary 33 x 35 mm cyst with internal echoes.  Left adnexa with tubular fluid collection consistent with hydrosalpinx.  Left ovary normal.  No free fluid.  ASSESSMENT  Abnormal uterine bleeding.  Mirena IUD.  Right ovarian cyst.  Menopausal symptoms? Chronic anemia and elevated WBC.   PLAN  We discussed her ovarian cyst.  Will plan for repeat US in 6 months.  Return for endometrial biopsy.  Will check FSH and estradiol. I have referred her to hematology/oncology.  Patient has an appt for her annual in 6 months also.    An After Visit Summary was printed and given to the patient.  ___25___ minutes face to face time of which over 50% was spent in counseling.

## 2018-11-13 NOTE — Progress Notes (Signed)
Encounter reviewed by Dr. Tambria Pfannenstiel Amundson C. Silva.  

## 2018-11-13 NOTE — Telephone Encounter (Signed)
Referral originally sent to [facility] however patient is requesting [facility].  Referral was sent to [facility]

## 2018-11-14 ENCOUNTER — Ambulatory Visit: Payer: BLUE CROSS/BLUE SHIELD | Admitting: Internal Medicine

## 2018-11-14 LAB — FOLLICLE STIMULATING HORMONE: FSH: 35.5 m[IU]/mL

## 2018-11-14 LAB — ESTRADIOL: Estradiol: 14.5 pg/mL

## 2018-11-16 ENCOUNTER — Encounter: Payer: Self-pay | Admitting: Internal Medicine

## 2018-11-16 DIAGNOSIS — R110 Nausea: Secondary | ICD-10-CM

## 2018-11-18 ENCOUNTER — Encounter: Payer: Self-pay | Admitting: Internal Medicine

## 2018-11-18 ENCOUNTER — Ambulatory Visit (INDEPENDENT_AMBULATORY_CARE_PROVIDER_SITE_OTHER): Payer: BLUE CROSS/BLUE SHIELD | Admitting: Internal Medicine

## 2018-11-18 ENCOUNTER — Other Ambulatory Visit: Payer: Self-pay

## 2018-11-18 ENCOUNTER — Telehealth: Payer: Self-pay | Admitting: Internal Medicine

## 2018-11-18 VITALS — BP 120/70 | HR 85 | Ht 65.0 in | Wt 356.0 lb

## 2018-11-18 DIAGNOSIS — E119 Type 2 diabetes mellitus without complications: Secondary | ICD-10-CM

## 2018-11-18 DIAGNOSIS — E890 Postprocedural hypothyroidism: Secondary | ICD-10-CM | POA: Diagnosis not present

## 2018-11-18 NOTE — Telephone Encounter (Signed)
Patient called [facility] 11/15/2018 at 9:02am stating she started taking a new thyroid dosage .  Unithroid 124mcg daily 3 days ago and had nausea, chills, and faintness last night.  States her old dosage was 139mcg.  She feels the new med is not helping and would like to know if she can take half or a full of her old pill.  She denies fever.  Patient was advised to call PCP withing 24 hours.

## 2018-11-18 NOTE — Progress Notes (Signed)
Patient ID: Erin [name], female   DOB: Mar 15, 1966, 53 y.o.   MRN: [id number]   HPI  Erin [name] is a 53 y.o.-year-old female, initially referred by her PCP, Dr. Quay Burow, presenting for follow-up for postablative hypothyroidism.  She previously saw Dr. Dwyane Dee.  Last visit with me 2 weeks ago.  At last visit, she was describing that she started to feel dizzy and have nausea after changing 1 of her blood pressure medicines. She contacted me through my chart to see if the symptoms could be due to her thyroid medication.  I advised her that this is unlikely.  She does saw PCP 10 days prior to our last appointment and her blood pressure medication was changed.  She was also found to be anemic and was started on iron.  At last visit she told me that she started to feel much better afterwards, but she still had a little nausea and dizziness so that is why she scheduled our last appointment 2 weeks ago.  At that time, I increased her Unithroid dose since her latest TSH was higher than target.  Patient continues to feel dizzy, having night sweats, chills, and she scheduled an appointment in person today.  Reviewed history: Pt. has been dx with hypothyroidism after RAI treatment for Graves' disease in 08/2008 >> on Unithroid d.a.w.. She had a lot of variability in the TFTs on generic LT4.  At last visit, she was not taking the medication correctly and I advised her how to take it correctly.  Since he made significant changes in how she was taking the medication, I also decreased her dose of levothyroxine from 125 to 100 mcg daily.  Pt is now on Unithroid 112 mcg (dose increased 2 weeks ago): - in am - fasting - at least 30 min from b'fast - no Ca, PPIs - + Fe, MVI - >4h after thyroid medication - + Biotin  Patient's TFTs were reviewed: Lab Results  Component Value Date   TSH 8.72 (H) 10/24/2018   TSH 1.34 05/16/2018   TSH 0.43 03/13/2018   TSH 1.06 01/29/2018   TSH 0.288 (L) 01/15/2018   TSH 0.14  (L) 11/27/2017   TSH 0.09 (L) 08/27/2017   TSH 18.55 (H) 06/27/2017   TSH 0.16 (L) 10/29/2016   TSH 10.45 (H) 02/14/2016   FREET4 0.83 05/16/2018   FREET4 1.14 11/27/2017   FREET4 1.11 08/27/2017   FREET4 0.67 06/27/2017   FREET4 1.17 10/29/2016   FREET4 0.72 02/14/2016   FREET4 1.07 07/25/2015   FREET4 0.81 11/17/2014   FREET4 0.75 08/03/2014   FREET4 0.68 12/01/2013   T3FREE 6.7 (H) 09/04/2007   Antithyroid antibodies: No results found for: THGAB No components found for: TPOAB  Pt denies: - feeling nodules in neck - hoarseness - dysphagia - choking - SOB with lying down  She has + FH of thyroid disorders in: mother, M aunts. No FH of thyroid cancer. No h/o radiation tx to head or neck other than RAI treatment.  No seaweed or kelp. No recent contrast studies. No herbal supplements. No Biotin use. No recent steroids use.   Pt. also has a history of uncontrolled diabetes, with latest HbA1c improved at 10.3%.  She has a history of DKA in 12/2017.  This is currently managed by PCP.  She was on high doses on Prednisone (60 mg) >> now 2.5 mg  ROS: Constitutional: no weight gain/no weight loss, no fatigue, no subjective hyperthermia, no subjective hypothermia Eyes: no blurry vision,  no xerophthalmia ENT: no sore throat, + see HPI Cardiovascular: no CP/no SOB/no palpitations/no leg swelling Respiratory: no cough/no SOB/no wheezing Gastrointestinal: no N/no V/no D/no C/no acid reflux Musculoskeletal: no muscle aches/no joint aches Skin: no rashes, no hair loss Neurological: no tremors/no numbness/no tingling/+ dizziness  I reviewed pt's medications, allergies, PMH, social hx, family hx, and changes were documented in the history of present illness. Otherwise, unchanged from my initial visit note.  Past Medical History:  Diagnosis Date  . Abnormal uterine bleeding   . Anemia   . Depression   . Diabetes mellitus without complication (HCC)    gestational  . Elevated  blood-pressure reading without diagnosis of hypertension   . Endometriosis   . Fibroids   . GERD (gastroesophageal reflux disease)   . Graves disease   . Grief at loss of child   . H/O gestational diabetes mellitus, not currently pregnant 09/28/2015  . Hypertension   . Hypothyroidism   . Interstitial cystitis   . Menorrhagia   . Oropharyngeal candidiasis 01/16/2018  . OSA (obstructive sleep apnea), severe 06/23/2018  . Premature ventricular contractions    a. rare PVC by monitor 12/2016.  . Right ovarian cyst    3 cm.  . Thyroid disease   . Uterine leiomyoma    Past Surgical History:  Procedure Laterality Date  . APPENDECTOMY    . ARTERY BIOPSY Left 10/25/2017   Procedure: BIOPSY TEMPORAL ARTERY;  Surgeon: Aviva Signs, MD;  Location: [facility];  Service: General;  Laterality: Left;  . CESAREAN SECTION    . CHOLECYSTECTOMY    . HERNIA REPAIR     incisional  . TUMOR REMOVAL  fibroids   Social History   Socioeconomic History  . Marital status: Married    Spouse name: Not on file  . Number of children: Not on file  . Years of education: Not on file  . Highest education level: Not on file  Occupational History  . Not on file  Social Needs  . Financial resource strain: Not on file  . Food insecurity:    Worry: Not on file    Inability: Not on file  . Transportation needs:    Medical: Not on file    Non-medical: Not on file  Tobacco Use  . Smoking status: Never Smoker  . Smokeless tobacco: Never Used  Substance and Sexual Activity  . Alcohol use: No  . Drug use: No  . Sexual activity: Yes    Birth control/protection: I.U.D.    Comment: Mirena inserted 2015/2016  Lifestyle  . Physical activity:    Days per week: Not on file    Minutes per session: Not on file  . Stress: Not on file  Relationships  . Social connections:    Talks on phone: Not on file    Gets together: Not on file    Attends religious service: Not on file    Active member of club or organization:  Not on file    Attends meetings of clubs or organizations: Not on file    Relationship status: Not on file  . Intimate partner violence:    Fear of current or ex partner: Not on file    Emotionally abused: Not on file    Physically abused: Not on file    Forced sexual activity: Not on file  Other Topics Concern  . Not on file  Social History Narrative  . Not on file   Current Outpatient Medications on File Prior to Visit  Medication Sig Dispense Refill  . cholecalciferol (VITAMIN D) 1000 units tablet Take 1,000 Units by mouth daily.    [name] diltiazem (CARDIZEM LA) 120 MG 24 hr tablet Take 1 tablet (120 mg total) by mouth daily. 90 tablet 1  . Fe Fum-FePoly-Vit C-Vit B3 (INTEGRA) 62.5-62.5-40-3 MG CAPS Take 1 capsule by mouth daily. 90 capsule 3  . gabapentin (NEURONTIN) 100 MG capsule TAKE 2 CAPSULES (200 MG TOTAL) BY MOUTH AT BEDTIME. 180 capsule 2  . ibuprofen (ADVIL,MOTRIN) 400 MG tablet Take 1 tablet (400 mg total) by mouth every 6 (six) hours as needed for fever, headache or mild pain (use first). 30 tablet 0  . levonorgestrel (MIRENA, 52 MG,) 20 MCG/24HR IUD 1 each by Intrauterine route once.     [name] losartan (COZAAR) 50 MG tablet Take 1 tablet (50 mg total) by mouth daily. 90 tablet 3  . methocarbamol (ROBAXIN) 500 MG tablet Take 1 tablet (500 mg total) by mouth every 6 (six) hours as needed for muscle spasms. 90 tablet 0  . Multiple Vitamin (MULTIVITAMIN) tablet Take 1 tablet by mouth daily.    . Olopatadine HCl (PATADAY) 0.2 % SOLN Place 1 drop into both eyes daily as needed (itching).     . ondansetron (ZOFRAN ODT) 4 MG disintegrating tablet Take 1 tablet (4 mg total) by mouth every 8 (eight) hours as needed for nausea or vomiting. 40 tablet 1  . Pseudoephedrine-Ibuprofen 30-200 MG TABS Take 2 tablets by mouth every 6 (six) hours as needed (cough, headache, congestion and fever).    [name] UNITHROID 112 MCG tablet Take 1 tablet (112 mcg total) by mouth daily before breakfast. 45 tablet 5    No current facility-administered medications on file prior to visit.    Allergies  Allergen Reactions  . Prednisone Other (See Comments)    Raises blood sugar so high that she receives intensive care  . Prozac [Fluoxetine Hcl] Nausea Only  . Hydrocodone Hives, Itching and Rash    On thighs    Family History  Problem Relation Age of Onset  . Hypertension Mother   . Cancer Mother   . Breast cancer Mother 46  . Hypertension Father   . Diabetes Father   . Cancer Father   . Ovarian cancer Maternal Aunt   . Thyroid disease Maternal Aunt        hypothyroid   PE: BP 120/70   Pulse 85   Ht 5\' 5"  (1.651 m)   Wt (!) 356 lb (161.5 kg)   LMP 10/27/2018 (Within Days)   SpO2 97%   BMI 59.24 kg/m  Wt Readings from Last 3 Encounters:  11/18/18 (!) 356 lb (161.5 kg)  11/13/18 (!) 357 lb (161.9 kg)  10/31/18 (!) 357 lb (161.9 kg)   Constitutional: Obese, in NAD Eyes: PERRLA, EOMI, no exophthalmos ENT: moist mucous membranes, no thyromegaly, no cervical lymphadenopathy Cardiovascular: RRR, No MRG Respiratory: CTA B Gastrointestinal: abdomen soft, NT, ND, BS+ Musculoskeletal: no deformities, strength intact in all 4 Skin: moist, warm, no rashes Neurological: no tremor with outstretched hands, DTR normal in all 4   ASSESSMENT: 1. Hypothyroidism - After RAI treatment for Graves' disease  2. 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 Patient with longstanding, uncontrolled, hypothyroidism, on Synthroid.  Before last visit, we optimized how she was taking her levothyroxine by moving this for thing in the morning and waiting 30 minutes before eating breakfast and drinking coffee with creamer.  We also moved her blood pressure medicines with  breakfast that she was taking them along with levothyroxine.  I also advised her to stop PPIs 30 minutes before lunch.  While making this changes, I also advised her to decrease the dose from 125 mcg to 100 mcg daily.  Subsequent TFTs were normal, but a more  recent TSH was high on the first of this month so we increased her Unithroid dose to 112 mcg 2 weeks ago. -I saw the patient 2 weeks ago and at that time she was telling me that she started to experience nausea and dizziness approximately 2 months before.  She saw her PCP and her blood pressure medications was changed from Metoprolol back to Cardizem. At the time of the visit, she was also found to be anemic and was started on iron.  She continues to take this correctly, 4 hours after levothyroxine.  -She is scheduled this appointment as she is still dizzy, nauseated, has night sweats, palpitations, and would like to see if her thyroid medication plays a role into this. She already switched to the lower dose of LT4, 100 mcg daily, first dose this am. It is ok to continue this especially since she tells me that before last TSH she has actually be taking the LT4 in the middle of the day, rather than in am, as she wanted to see if this will help with her sxs. Since it did not, she moved the LT4 back to am ~2 weeks ago. I encouraged her to keep the dose in am. -We will recheck her TFTs in 5 weeks -I will see her back in 6 months  2. 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  Dizziness -Patient started to feel much better after adjustment of her blood pressure medicines and also after starting iron for her anemia. -It is very unlikely that her dizziness is related to her hypothyroidism which is fairly well-controlled.  She had a high TSH at last check, at the beginning of this month, but this is not usually associated with dizziness/nausea.  We increased the dose of Unithroid at that time and she continues to  feel nauseated and dizzy. She switched to the lower, previous Unithroid dose this am and we will continue this for now. -We discussed that she may need to have neurologic/GI investigation for this >> both referral now in process. She may also need to see cardiology as she also has palpitations and she is diabetic which can predispose her to silent ischemia. Pt will d/w PCP  Patient Instructions  Please continue Unithroid 112 mcg daily.  Take the thyroid hormone every day, with water, at least 30 minutes before breakfast, separated by at least 4 hours from: - acid reflux medications - calcium - iron - multivitamins  Come back for labs in 3 weeks.  Please come back for a follow-up appointment in 6 months.   Philemon Kingdom, MD PhD [facility] Endocrinology

## 2018-11-18 NOTE — Telephone Encounter (Signed)
She should continue the 19mcg daily.  It takes a few weeks for the increase in dose to take affect and for her to feel a difference

## 2018-11-18 NOTE — Telephone Encounter (Signed)
Please advise 

## 2018-11-18 NOTE — Patient Instructions (Addendum)
Please continue: - Unithroid 100 mcg daily.  Take the thyroid hormone every day, with water, at least 30 minutes before breakfast, separated by at least 4 hours from: - acid reflux medications - calcium - iron - multivitamins  Please come back for las in 1.5 months for labs and in 5.5 months for a visit.

## 2018-11-19 ENCOUNTER — Telehealth: Payer: Self-pay | Admitting: Internal Medicine

## 2018-11-19 ENCOUNTER — Telehealth: Payer: Self-pay

## 2018-11-19 DIAGNOSIS — R42 Dizziness and giddiness: Secondary | ICD-10-CM

## 2018-11-19 DIAGNOSIS — R110 Nausea: Secondary | ICD-10-CM

## 2018-11-19 NOTE — Telephone Encounter (Signed)
Referral ordered

## 2018-11-19 NOTE — Telephone Encounter (Signed)
Copied from [place] [id number]. Topic: Referral - Request for Referral >> Nov 18, 2018  4:19 PM Loma [place] wrote: CRM for notification. See Telephone encounter for: 11/18/18. After hrs... Pt has seen Dr Cruzita Lederer but  is stating that Cruzita Lederer feels that pt should see a  cardiologist. Pt would like to to see a dr in the [facility] network. She, pt is rather anxious that this referral is done quickly. Please FU with PT

## 2018-11-19 NOTE — Telephone Encounter (Signed)
Pt aware.

## 2018-11-19 NOTE — Telephone Encounter (Signed)
Was told not to change dosage by Dr. Cruzita Lederer. She went back on old dose.

## 2018-11-19 NOTE — Telephone Encounter (Signed)
Pt cld to reschedule appt w/Dr. Walden Field to 6/10 at 1050am.

## 2018-11-20 ENCOUNTER — Ambulatory Visit: Payer: Self-pay | Admitting: Internal Medicine

## 2018-11-21 ENCOUNTER — Telehealth: Payer: Self-pay

## 2018-11-21 NOTE — Telephone Encounter (Signed)
Dr. Quay Burow has recently put in a referral to cardiology for patient. She wants to see Dr. Johnsie Cancel. Can we change that referral to where that is the doctor she will see?

## 2018-11-21 NOTE — Telephone Encounter (Signed)
Noted in pt's referral  

## 2018-11-21 NOTE — Telephone Encounter (Signed)
Copied from [place] [id number]. Topic: Referral - Request for Referral >> Nov 21, 2018 11:32 AM Leward Quan A wrote: Has patient seen PCP for this complaint? Yes.   *If NO, is insurance requiring patient see PCP for this issue before PCP can refer them? Referral for which specialty: Cardiologist Preferred provider/office: Dr Armando Gang ? Reason for referral: request from endocrinologist

## 2018-11-24 ENCOUNTER — Ambulatory Visit (INDEPENDENT_AMBULATORY_CARE_PROVIDER_SITE_OTHER): Payer: Self-pay | Admitting: Internal Medicine

## 2018-11-24 ENCOUNTER — Encounter: Payer: Self-pay | Admitting: Internal Medicine

## 2018-11-24 ENCOUNTER — Other Ambulatory Visit: Payer: Self-pay

## 2018-11-24 VITALS — BP 142/74 | HR 81 | Temp 98.1°F | Resp 16 | Ht 65.0 in | Wt 356.0 lb

## 2018-11-24 DIAGNOSIS — M25562 Pain in left knee: Secondary | ICD-10-CM

## 2018-11-24 NOTE — Progress Notes (Signed)
Subjective:    Patient ID: Erin [name], female    DOB: 10-12-1965, 53 y.o.   MRN: [id number]  HPI The patient is here for an acute visit.   Left knee pain:  It has been hurting intermittently for a while.  It has been worse recently and has been affecting her sleep.  It is painful to walk.  It is tender to touch a little.   She tried taking advil - ?work.   It hurts when she is laying down.   - it throbs.  She can not turn over in bed because it is too painful.    No swelling.    The muscle relaxer helps a little with the pain.  She continues to have nausea, dizziness and sweats mostly at night - her gyn feels this may be hormonal.     Medications and allergies reviewed with patient and updated if appropriate.  Patient Active Problem List   Diagnosis Date Noted  . Lightheadedness 10/24/2018  . Metrorrhagia 10/24/2018  . Lumbar back pain with radiculopathy affecting left lower extremity 10/07/2018  . Right ovarian cyst   . OSA (obstructive sleep apnea), severe 06/23/2018  . Anxiety 03/05/2018  . Chronic nonintractable headache 03/05/2018  . Hyperlipidemia 01/23/2018  . Elevated liver enzymes   . Diabetes mellitus type 2, uncomplicated ([place]) 83/02/4075  . DKA (diabetic ketoacidoses) ([place]) 01/15/2018  . Gastroesophageal reflux disease 10/05/2017  . Temporal arteritis ([place]) 10/03/2017  . Right foot pain 07/15/2017  . Grief at loss of child 11/02/2016  . Hypertension 09/28/2015  . Morbid obesity ([place]) 05/17/2015  . Umbilical hernia 80/88/1103  . Chronic lower back pain 05/17/2015  . Snoring 05/17/2015  . Palpitations 05/17/2015  . ANEMIA 09/03/2007  . Hypothyroidism following radioiodine therapy 09/02/2007  . INTERSTITIAL CYSTITIS 09/01/2007  . ALLERGIC RHINITIS 08/07/2007  . [place] DISEASE, LUMBAR 08/07/2007    Current Outpatient Medications on File Prior to Visit  Medication Sig Dispense Refill  . cholecalciferol (VITAMIN D) 1000 units tablet Take 1,000 Units by  mouth daily.    [name] diltiazem (CARDIZEM LA) 120 MG 24 hr tablet Take 1 tablet (120 mg total) by mouth daily. 90 tablet 1  . Fe Fum-FePoly-Vit C-Vit B3 (INTEGRA) 62.5-62.5-40-3 MG CAPS Take 1 capsule by mouth daily. 90 capsule 3  . gabapentin (NEURONTIN) 100 MG capsule TAKE 2 CAPSULES (200 MG TOTAL) BY MOUTH AT BEDTIME. 180 capsule 2  . ibuprofen (ADVIL,MOTRIN) 400 MG tablet Take 1 tablet (400 mg total) by mouth every 6 (six) hours as needed for fever, headache or mild pain (use first). 30 tablet 0  . levonorgestrel (MIRENA, 52 MG,) 20 MCG/24HR IUD 1 each by Intrauterine route once.     [name] losartan (COZAAR) 50 MG tablet Take 1 tablet (50 mg total) by mouth daily. 90 tablet 3  . methocarbamol (ROBAXIN) 500 MG tablet Take 1 tablet (500 mg total) by mouth every 6 (six) hours as needed for muscle spasms. 90 tablet 0  . Multiple Vitamin (MULTIVITAMIN) tablet Take 1 tablet by mouth daily.    . Olopatadine HCl (PATADAY) 0.2 % SOLN Place 1 drop into both eyes daily as needed (itching).     . ondansetron (ZOFRAN ODT) 4 MG disintegrating tablet Take 1 tablet (4 mg total) by mouth every 8 (eight) hours as needed for nausea or vomiting. 40 tablet 1  . Pseudoephedrine-Ibuprofen 30-200 MG TABS Take 2 tablets by mouth every 6 (six) hours as needed (cough, headache, congestion and fever).    [name]  UNITHROID 112 MCG tablet Take 1 tablet (112 mcg total) by mouth daily before breakfast. 45 tablet 5   No current facility-administered medications on file prior to visit.     Past Medical History:  Diagnosis Date  . Abnormal uterine bleeding   . Anemia   . Depression   . Diabetes mellitus without complication (HCC)    gestational  . Elevated blood-pressure reading without diagnosis of hypertension   . Endometriosis   . Fibroids   . GERD (gastroesophageal reflux disease)   . Graves disease   . Grief at loss of child   . H/O gestational diabetes mellitus, not currently pregnant 09/28/2015  . Hypertension   .  Hypothyroidism   . Interstitial cystitis   . Menorrhagia   . Oropharyngeal candidiasis 01/16/2018  . OSA (obstructive sleep apnea), severe 06/23/2018  . Premature ventricular contractions    a. rare PVC by monitor 12/2016.  . Right ovarian cyst    3 cm.  . Thyroid disease   . Uterine leiomyoma     Past Surgical History:  Procedure Laterality Date  . APPENDECTOMY    . ARTERY BIOPSY Left 10/25/2017   Procedure: BIOPSY TEMPORAL ARTERY;  Surgeon: Aviva Signs, MD;  Location: [facility];  Service: General;  Laterality: Left;  . CESAREAN SECTION    . CHOLECYSTECTOMY    . HERNIA REPAIR     incisional  . TUMOR REMOVAL  fibroids    Social History   Socioeconomic History  . Marital status: Married    Spouse name: Not on file  . Number of children: Not on file  . Years of education: Not on file  . Highest education level: Not on file  Occupational History  . Not on file  Social Needs  . Financial resource strain: Not on file  . Food insecurity:    Worry: Not on file    Inability: Not on file  . Transportation needs:    Medical: Not on file    Non-medical: Not on file  Tobacco Use  . Smoking status: Never Smoker  . Smokeless tobacco: Never Used  Substance and Sexual Activity  . Alcohol use: No  . Drug use: No  . Sexual activity: Yes    Birth control/protection: I.U.D.    Comment: Mirena inserted 2015/2016  Lifestyle  . Physical activity:    Days per week: Not on file    Minutes per session: Not on file  . Stress: Not on file  Relationships  . Social connections:    Talks on phone: Not on file    Gets together: Not on file    Attends religious service: Not on file    Active member of club or organization: Not on file    Attends meetings of clubs or organizations: Not on file    Relationship status: Not on file  Other Topics Concern  . Not on file  Social History Narrative  . Not on file    Family History  Problem Relation Age of Onset  . Hypertension Mother   .  Cancer Mother   . Breast cancer Mother 49  . Hypertension Father   . Diabetes Father   . Cancer Father   . Ovarian cancer Maternal Aunt   . Thyroid disease Maternal Aunt        hypothyroid    Review of Systems As above    Objective:   Vitals:   11/24/18 1437  BP: (!) 142/74  Pulse: 81  Resp: 16  Temp: 98.1 F (36.7 C)  SpO2: 97%   BP Readings from Last 3 Encounters:  11/24/18 (!) 142/74  11/18/18 120/70  11/13/18 122/70   Wt Readings from Last 3 Encounters:  11/24/18 (!) 356 lb (161.5 kg)  11/18/18 (!) 356 lb (161.5 kg)  11/13/18 (!) 357 lb (161.9 kg)   Body mass index is 59.24 kg/m.   Physical Exam    A left knee exam was performed.   SKIN: intact, no bruising   SWELLING: no  EFFUSION: no WARMTH: no warmth  TENDERNESS: minimal tenderness on knee cap.  Positive pain with McMurray manuver ROM: full extension, full flexion  GAIT: walks with a limp   NEUROLOGICAL EXAM: normal sensation  CALF TENDERNESS: no        Assessment & Plan:    See Problem List for Assessment and Plan of chronic medical problems.

## 2018-11-24 NOTE — Assessment & Plan Note (Signed)
Possible meniscus tear Will refer to sports medicine Continue advil, methocarbamol which seems to help Wants to try gabapentin - ok to take at night - may also help with nausea/dizziness/sweats Will hold off on anything stronger for pain Ice knee Topical arthritis medication

## 2018-11-24 NOTE — Patient Instructions (Addendum)
Make an appointment with our sports medicine doctor - Dr Tamala Julian   Take the muscle relaxer as needed.  Start the gabapentin at night to see if that helps with your knee pain and nausea/dizziness.    Ice the knee.  Continue advil.   Use any topical medication you want.

## 2018-11-25 ENCOUNTER — Encounter: Payer: Self-pay | Admitting: Internal Medicine

## 2018-11-27 ENCOUNTER — Telehealth: Payer: Self-pay | Admitting: *Deleted

## 2018-11-27 NOTE — Telephone Encounter (Signed)
lvm with instructions for upcoming telehealth visit on 6/10 with Truitt Merle, NP.  Pt is instructed if cannot keep appt to call office to R/S.  Pt will need consent day of visit.

## 2018-11-28 ENCOUNTER — Encounter: Payer: Self-pay | Admitting: Family Medicine

## 2018-11-28 ENCOUNTER — Ambulatory Visit: Payer: Self-pay

## 2018-11-28 ENCOUNTER — Ambulatory Visit (INDEPENDENT_AMBULATORY_CARE_PROVIDER_SITE_OTHER): Payer: Self-pay | Admitting: Family Medicine

## 2018-11-28 ENCOUNTER — Other Ambulatory Visit: Payer: Self-pay

## 2018-11-28 ENCOUNTER — Ambulatory Visit (INDEPENDENT_AMBULATORY_CARE_PROVIDER_SITE_OTHER)
Admission: RE | Admit: 2018-11-28 | Discharge: 2018-11-28 | Disposition: A | Discharge status: Home / Self Care | Payer: Self-pay | Source: Ambulatory Visit | Attending: Family Medicine | Admitting: Family Medicine

## 2018-11-28 VITALS — BP 124/70 | HR 97 | Ht 65.0 in | Wt 356.0 lb

## 2018-11-28 DIAGNOSIS — M25562 Pain in left knee: Secondary | ICD-10-CM

## 2018-11-28 DIAGNOSIS — G8929 Other chronic pain: Secondary | ICD-10-CM

## 2018-11-28 DIAGNOSIS — M1712 Unilateral primary osteoarthritis, left knee: Secondary | ICD-10-CM

## 2018-11-28 MED ORDER — DICLOFENAC SODIUM 2 % TD SOLN: 2.0000 g | Freq: Two times a day (BID) | TRANSDERMAL | 3 refills | Status: DC

## 2018-11-28 NOTE — Assessment & Plan Note (Signed)
Patellofemoral arthritis.  Mild to moderate.  Discussed home exercise, icing regimen, which activities of doing which wants to avoid.  Patient is to increase activity slowly.  Discussed posture and ergonomics.  Discussed which activities and which ones to avoid.  Brace given  RTC in 4 weeks

## 2018-11-28 NOTE — Patient Instructions (Addendum)
Good to see you.  Ice 20 minutes 2 times daily. Usually after activity and before bed. Exercises 3 times a week.  pennsaid pinkie amount topically 2 times daily as needed.  Turmeric 500mg  daily  Tart cherry extract 1200mg  at night Vitamin D 2000 IU daily  See me again in 4 weeks

## 2018-11-28 NOTE — Progress Notes (Signed)
Erin [name] [address] [place], [place] [address] Phone: [phone] Subjective:   I Erin [name] am serving as a Education administrator for Dr. Hulan Saas.  I'm seeing this patient by the request  AY:TKZSW, Erin Lick, MD     CC: left knee pain   FUX:[id number]  Erin [name] is a 53 y.o. female coming in with complaint of left knee pain. States the knee is weak and wants to give way. Pain radiates down the back of her calf.   Onset- Chronic  Location - knee cap  Duration-  Character- throbbing, achy  Aggravating factors- Reliving factors- ice and heat Therapies tried-only ice and heat has been tried [name] out of 10     Past Medical History:  Diagnosis Date  . Abnormal uterine bleeding   . Anemia   . Depression   . Diabetes mellitus without complication (HCC)    gestational  . Elevated blood-pressure reading without diagnosis of hypertension   . Endometriosis   . Fibroids   . GERD (gastroesophageal reflux disease)   . Graves disease   . Grief at loss of child   . H/O gestational diabetes mellitus, not currently pregnant 09/28/2015  . Hypertension   . Hypothyroidism   . Interstitial cystitis   . Menorrhagia   . Oropharyngeal candidiasis 01/16/2018  . OSA (obstructive sleep apnea), severe 06/23/2018  . Premature ventricular contractions    a. rare PVC by monitor 12/2016.  . Right ovarian cyst    3 cm.  . Thyroid disease   . Uterine leiomyoma    Past Surgical History:  Procedure Laterality Date  . APPENDECTOMY    . ARTERY BIOPSY Left 10/25/2017   Procedure: BIOPSY TEMPORAL ARTERY;  Surgeon: Aviva Signs, MD;  Location: [facility];  Service: General;  Laterality: Left;  . CESAREAN SECTION    . CHOLECYSTECTOMY    . HERNIA REPAIR     incisional  . TUMOR REMOVAL  fibroids   Social History   Socioeconomic History  . Marital status: Married    Spouse name: Not on file  . Number of children: Not on file  . Years of education: Not on file  .  Highest education level: Not on file  Occupational History  . Not on file  Social Needs  . Financial resource strain: Not on file  . Food insecurity:    Worry: Not on file    Inability: Not on file  . Transportation needs:    Medical: Not on file    Non-medical: Not on file  Tobacco Use  . Smoking status: Never Smoker  . Smokeless tobacco: Never Used  Substance and Sexual Activity  . Alcohol use: No  . Drug use: No  . Sexual activity: Yes    Birth control/protection: I.U.D.    Comment: Mirena inserted 2015/2016  Lifestyle  . Physical activity:    Days per week: Not on file    Minutes per session: Not on file  . Stress: Not on file  Relationships  . Social connections:    Talks on phone: Not on file    Gets together: Not on file    Attends religious service: Not on file    Active member of club or organization: Not on file    Attends meetings of clubs or organizations: Not on file    Relationship status: Not on file  Other Topics Concern  . Not on file  Social History Narrative  . Not on file  Allergies  Allergen Reactions  . Prednisone Other (See Comments)    Raises blood sugar so high that she receives intensive care  . Prozac [Fluoxetine Hcl] Nausea Only  . Hydrocodone Hives, Itching and Rash    On thighs    Family History  Problem Relation Age of Onset  . Hypertension Mother   . Cancer Mother   . Breast cancer Mother 67  . Hypertension Father   . Diabetes Father   . Cancer Father   . Ovarian cancer Maternal Aunt   . Thyroid disease Maternal Aunt        hypothyroid    Current Outpatient Medications (Endocrine & Metabolic):  .  levonorgestrel (MIRENA, 52 MG,) 20 MCG/24HR IUD, 1 each by Intrauterine route once.  [name]  UNITHROID 112 MCG tablet, Take 1 tablet (112 mcg total) by mouth daily before breakfast.  Current Outpatient Medications (Cardiovascular):  .  diltiazem (CARDIZEM LA) 120 MG 24 hr tablet, Take 1 tablet (120 mg total) by mouth daily. [name]   losartan (COZAAR) 50 MG tablet, Take 1 tablet (50 mg total) by mouth daily.  Current Outpatient Medications (Respiratory):  [name]  Pseudoephedrine-Ibuprofen 30-200 MG TABS, Take 2 tablets by mouth every 6 (six) hours as needed (cough, headache, congestion and fever).  Current Outpatient Medications (Analgesics):  .  ibuprofen (ADVIL,MOTRIN) 400 MG tablet, Take 1 tablet (400 mg total) by mouth every 6 (six) hours as needed for fever, headache or mild pain (use first).  Current Outpatient Medications (Hematological):  [name]  Fe Fum-FePoly-Vit C-Vit B3 (INTEGRA) 62.5-62.5-40-3 MG CAPS, Take 1 capsule by mouth daily.  Current Outpatient Medications (Other):  .  cholecalciferol (VITAMIN D) 1000 units tablet, Take 1,000 Units by mouth daily. .  Diclofenac Sodium (PENNSAID) 2 % SOLN, Place 2 g onto the skin 2 (two) times daily. [name]  gabapentin (NEURONTIN) 100 MG capsule, TAKE 2 CAPSULES (200 MG TOTAL) BY MOUTH AT BEDTIME. .  methocarbamol (ROBAXIN) 500 MG tablet, Take 1 tablet (500 mg total) by mouth every 6 (six) hours as needed for muscle spasms. .  Multiple Vitamin (MULTIVITAMIN) tablet, Take 1 tablet by mouth daily. .  Olopatadine HCl (PATADAY) 0.2 % SOLN, Place 1 drop into both eyes daily as needed (itching).  .  ondansetron (ZOFRAN ODT) 4 MG disintegrating tablet, Take 1 tablet (4 mg total) by mouth every 8 (eight) hours as needed for nausea or vomiting.    Past medical history, social, surgical and family history all reviewed in electronic medical record.  No pertanent information unless stated regarding to the chief complaint.   Review of Systems:  No headache, visual changes, nausea, vomiting, diarrhea, constipation, dizziness, abdominal pain, skin rash, fevers, chills, night sweats, weight loss, swollen lymph nodes, body aches, joint swelling,  chest pain, shortness of breath, mood changes.  Positive muscle aches  Objective  Blood pressure 124/70, pulse 97, height 5\' 5"  (1.651 m), weight (!) 356  lb (161.5 kg), SpO2 98 %.   General: No apparent distress alert and oriented x3 mood and affect normal, dressed appropriately.  Morbidly obese HEENT: Pupils equal, extraocular movements intact  Respiratory: Patient's speak in full sentences and does not appear short of breath  Cardiovascular: No lower extremity edema, non tender, no erythema  Skin: Warm dry intact with no signs of infection or rash on extremities or on axial skeleton.  Abdomen: Soft nontender  Neuro: Cranial nerves II through XII are intact, neurovascularly intact in all extremities with 2+ DTRs and 2+ pulses.  Lymph:  No lymphadenopathy of posterior or anterior cervical chain or axillae bilaterally.  Gait antalgic MSK:  tender with limited range of motion and good stability and symmetric strength and tone of shoulders, elbows, wrist, hip, and ankles bilaterally.  Left knee exam on inspection shows the patient does have a very large thigh to calf ratio.  Patient does have tenderness to palpation over the patella tendon.  Positive patellar grind.  Patient does have some lateral tracking noted.  Negative McMurray's.  Limited musculoskeletal ultrasound was performed and interpreted by Lyndal Pulley  Limited ultrasound of patient's knee shows the patient does have some narrowing of the patellofemoral joint moderate to severe.  Trace effusion noted.  Patient has very mild narrowing of the medial joint line.  Lateral joint space is intact.  Meniscus appear to be fine.  No Baker's cyst appreciated.  [id number]; 15 additional minutes spent for Therapeutic exercises as stated in above notes.  This included exercises focusing on stretching, strengthening, with significant focus on eccentric aspects.   Long term goals include an improvement in range of motion, strength, endurance as well as avoiding reinjury. Patient's frequency would include in 1-2 times a day, 3-5 times a week for a duration of 6-12 weeks. Reviewed anatomy using anatomical model  and how PFS occurs.  Given rehab exercises handout for VMO, hip abductors, core, entire kinetic chain including proprioception exercises.  Could benefit from PT, regular exercise, upright biking, and a PFS knee brace to assist with tracking abnormalities.   Proper technique shown and discussed handout in great detail with ATC.  All questions were discussed and answered.      Impression and Recommendations:     This case required medical decision making of moderate complexity. The above documentation has been reviewed and is accurate and complete Lyndal Pulley, DO       Note: This dictation was prepared with Dragon dictation along with smaller phrase technology. Any transcriptional errors that result from this process are unintentional.

## 2018-12-01 ENCOUNTER — Other Ambulatory Visit: Payer: Self-pay

## 2018-12-01 ENCOUNTER — Encounter: Payer: Self-pay | Admitting: Gastroenterology

## 2018-12-01 ENCOUNTER — Ambulatory Visit (INDEPENDENT_AMBULATORY_CARE_PROVIDER_SITE_OTHER): Payer: Self-pay | Admitting: Gastroenterology

## 2018-12-01 VITALS — Ht 65.0 in | Wt 357.0 lb

## 2018-12-01 DIAGNOSIS — D509 Iron deficiency anemia, unspecified: Secondary | ICD-10-CM

## 2018-12-01 DIAGNOSIS — R110 Nausea: Secondary | ICD-10-CM

## 2018-12-01 MED ORDER — PANTOPRAZOLE SODIUM 40 MG PO TBEC: 40.0000 mg | DELAYED_RELEASE_TABLET | Freq: Every day | ORAL | 3 refills | Status: DC

## 2018-12-01 NOTE — Patient Instructions (Addendum)
I have recommended an upper endoscopy to evaluate your nausea and a colonoscopy to screen for colon cancer.  In the meantime, I would like for your to try pantoprazole 40 mg daily to see if this might make your nausea better.   Avoid any foods that make you feel nauseated. This may include spicy, strong-smelling, and high fat foods. You might find cold, bland foods easier to eat without feeling nauseated.  Try using ginger products such as tea, ginger tablets, or ginger ale to help with the nausea.  Eat frequent, small, high calorie meals and snacks. Hunger can make the feelings of nausea stronger.  Sit upright or recline with head elevated for at least 30-60 minutes after meals.   Good oral hygiene with frequent tooth brushing can help reduce unpleasant mouth tastes contributing to nausea.  Apply a cool damp cloth on your neck or forehead if you are very nauseous.   Relaxation, imagery, acupressure, and acupuncture may all provide some relief. Keep an open mind and try them.   Thank you for your patience with me and our technology today! I look forward to meeting you in person in the future.

## 2018-12-01 NOTE — Progress Notes (Signed)
TELEHEALTH VISIT  Referring Provider: Binnie Rail, MD Primary Care Physician:  Binnie Rail, MD   Tele-visit due to COVID-19 pandemic Patient requested visit virtually, consented to the virtual encounter via video enabled telemedicine application Contact made at: 15:00 12/01/18 Patient verified by name and date of birth Location of patient: Home Location provider: [facility] Names of persons participating: Me, patient, Arda Daggs CMA Time spent on telehealth visit: 26 minutes I discussed the limitations of evaluation and management by telemedicine. The patient expressed understanding and agreed to proceed.  Reason for Consultation: Nausea   IMPRESSION:  Chronic nausea Iron deficiency anemia with intolerance to oral iron TSH 8.18 Umbilical herniorraphy Prior cholecystectomy Prior appendectomy 2004 BMI 59 No prior colon cancer screening No known family history of colon cancer or polyps  Etiology of nausea and iron deficiency anemia may be related.   PLAN: EGD and colonoscopy at the hospital if cleared by cardiology (BMI 59) Trial of pantoprazole 40 mg daily Reviewed dietary and lifestyle recommendations to minimize nausea  I consented the patient discussing the risks, benefits, and alternatives to endoscopic evaluation. In particular, we discussed the risks that include, but are not limited to, reaction to medication, cardiopulmonary compromise, bleeding requiring blood transfusion, aspiration resulting in pneumonia, perforation requiring surgery, lack of diagnosis, severe illness requiring hospitalization, and even death. We reviewed the risk of missed lesion including polyps or even cancer. The patient acknowledges these risks and asks that we proceed.  HPI: Erin [name] is a 53 y.o. female referred by Dr. Quay Burow for further evaluation of nausea.  The history is obtained to the patient and review of her electronic health record. She has postablative  hypothyroidism after treatment for Graves' disease, obesity, menorrhagia, endometriosis, and hypertension.   Chronic nausea and night sweats for a couple of years, worse over the last couple months with escalation to the point that she feels like she might pass out. Nausea may develop as early as 8pm, but becomes most severe between 2-5 AM. Has switched her antihypertensives and thryoid medications without change. Dr. De Blanch told her that it's not the thyroid. Ondansetron has provided some relief. She also started gabapentin to prolong her sleep.  Eats a light breakfast. No dysphagia or odynophagia. Rare heartburn but she does not take medications for it. No abdominal pain or change in bowel habits.  No other associated symptoms. No identified exacerbating or relieving features.   Meeting with the cardiologist this week.   Labs 10/24/18 show a hgb 10.6, MCV 78.5, RDW 16.9, platelets 358. The patient reports intermittent bloody vaginal discharge. Has an upcoming appointment with GYN for a biopsy. She was previously on oral iron. Recently resumed iron supplements (2 weeks ago) and tumeric (on her second day) for knee issues.    Abdominal ultrasound 01/15/2018 showed the surgically absent gallbladder and hepatic steatosis. Vaginal [place] 05/15/18 showed a small 13 mm fibroid and a 30 mm thin walled right ovarian cyst with a few scattered echoes.   Abdominal CT with contrast 5/63/1497: umbilical hernia.  Mother had breast cancer. No known family history of colon cancer or polyps. No family history of uterine/endometrial cancer, pancreatic cancer or gastric/stomach cancer.  Past Medical History:  Diagnosis Date  . Abnormal uterine bleeding   . Anemia   . Depression   . Diabetes mellitus without complication (HCC)    gestational  . Elevated blood-pressure reading without diagnosis of hypertension   . Endometriosis   . Fibroids   .  GERD (gastroesophageal reflux disease)   . Graves disease   . Grief  at loss of child   . H/O gestational diabetes mellitus, not currently pregnant 09/28/2015  . Hypertension   . Hypothyroidism   . Interstitial cystitis   . Menorrhagia   . Oropharyngeal candidiasis 01/16/2018  . OSA (obstructive sleep apnea), severe 06/23/2018  . Premature ventricular contractions    a. rare PVC by monitor 12/2016.  . Right ovarian cyst    3 cm.  . Thyroid disease   . Uterine leiomyoma     Past Surgical History:  Procedure Laterality Date  . APPENDECTOMY    . ARTERY BIOPSY Left 10/25/2017   Procedure: BIOPSY TEMPORAL ARTERY;  Surgeon: Aviva Signs, MD;  Location: [facility];  Service: General;  Laterality: Left;  . CESAREAN SECTION    . CHOLECYSTECTOMY    . HERNIA REPAIR     incisional  . TUMOR REMOVAL  fibroids    Current Outpatient Medications  Medication Sig Dispense Refill  . cholecalciferol (VITAMIN D) 1000 units tablet Take 1,000 Units by mouth daily.    . Diclofenac Sodium (PENNSAID) 2 % SOLN Place 2 g onto the skin 2 (two) times daily. 112 g 3  . diltiazem (CARDIZEM LA) 120 MG 24 hr tablet Take 1 tablet (120 mg total) by mouth daily. 90 tablet 1  . Fe Fum-FePoly-Vit C-Vit B3 (INTEGRA) 62.5-62.5-40-3 MG CAPS Take 1 capsule by mouth daily. 90 capsule 3  . gabapentin (NEURONTIN) 100 MG capsule TAKE 2 CAPSULES (200 MG TOTAL) BY MOUTH AT BEDTIME. 180 capsule 2  . ibuprofen (ADVIL,MOTRIN) 400 MG tablet Take 1 tablet (400 mg total) by mouth every 6 (six) hours as needed for fever, headache or mild pain (use first). 30 tablet 0  . levonorgestrel (MIRENA, 52 MG,) 20 MCG/24HR IUD 1 each by Intrauterine route once.     [name] losartan (COZAAR) 50 MG tablet Take 1 tablet (50 mg total) by mouth daily. 90 tablet 3  . methocarbamol (ROBAXIN) 500 MG tablet Take 1 tablet (500 mg total) by mouth every 6 (six) hours as needed for muscle spasms. 90 tablet 0  . Multiple Vitamin (MULTIVITAMIN) tablet Take 1 tablet by mouth daily.    . Olopatadine HCl (PATADAY) 0.2 % SOLN Place 1 drop into  both eyes daily as needed (itching).     . ondansetron (ZOFRAN ODT) 4 MG disintegrating tablet Take 1 tablet (4 mg total) by mouth every 8 (eight) hours as needed for nausea or vomiting. 40 tablet 1  . Pseudoephedrine-Ibuprofen 30-200 MG TABS Take 2 tablets by mouth every 6 (six) hours as needed (cough, headache, congestion and fever).    [name] UNITHROID 112 MCG tablet Take 1 tablet (112 mcg total) by mouth daily before breakfast. 45 tablet 5   No current facility-administered medications for this visit.     Allergies as of 12/01/2018 - Review Complete 11/28/2018  Allergen Reaction Noted  . Prednisone Other (See Comments) 08/16/2018  . Prozac [fluoxetine hcl] Nausea Only 11/06/2016  . Hydrocodone Hives, Itching, and Rash 06/17/2011    Family History  Problem Relation Age of Onset  . Hypertension Mother   . Cancer Mother   . Breast cancer Mother 48  . Hypertension Father   . Diabetes Father   . Cancer Father   . Ovarian cancer Maternal Aunt   . Thyroid disease Maternal Aunt        hypothyroid    Social History   Socioeconomic History  . Marital status:  Married    Spouse name: Not on file  . Number of children: Not on file  . Years of education: Not on file  . Highest education level: Not on file  Occupational History  . Not on file  Social Needs  . Financial resource strain: Not on file  . Food insecurity:    Worry: Not on file    Inability: Not on file  . Transportation needs:    Medical: Not on file    Non-medical: Not on file  Tobacco Use  . Smoking status: Never Smoker  . Smokeless tobacco: Never Used  Substance and Sexual Activity  . Alcohol use: No  . Drug use: No  . Sexual activity: Yes    Birth control/protection: I.U.D.    Comment: Mirena inserted 2015/2016  Lifestyle  . Physical activity:    Days per week: Not on file    Minutes per session: Not on file  . Stress: Not on file  Relationships  . Social connections:    Talks on phone: Not on file     Gets together: Not on file    Attends religious service: Not on file    Active member of club or organization: Not on file    Attends meetings of clubs or organizations: Not on file    Relationship status: Not on file  . Intimate partner violence:    Fear of current or ex partner: Not on file    Emotionally abused: Not on file    Physically abused: Not on file    Forced sexual activity: Not on file  Other Topics Concern  . Not on file  Social History Narrative  . Not on file    Review of Systems: ALL ROS discussed and all others negative except listed in HPI.  Physical Exam: General: in no acute distress Neuro: Alert and appropriate Psych: Normal affect and normal insight   Koya Hunger L. Tarri Glenn, MD, MPH [facility] 12/01/2018, 9:04 AM

## 2018-12-02 ENCOUNTER — Telehealth: Payer: Self-pay | Admitting: Emergency Medicine

## 2018-12-02 MED ORDER — FAMOTIDINE 20 MG PO TABS: 20.0000 mg | ORAL_TABLET | Freq: Two times a day (BID) | ORAL | 3 refills | Status: DC

## 2018-12-02 NOTE — Telephone Encounter (Signed)
We can address this at her visit tomorrow.   Will you please let her know?  Cecille Rubin

## 2018-12-02 NOTE — Telephone Encounter (Signed)
Famotidine 20 mg BID. Thanks.

## 2018-12-02 NOTE — Telephone Encounter (Signed)
Erin [name],   Dr. Tarri Glenn would like to get this patient scheduled for an endoscopy and colonoscopy. Is this patient ok from a cardiac standpoint to undergo these procedures? She has an appointment with you tomorrow.    Thanks,   Tinnie Gens, CMA [place], [place]

## 2018-12-02 NOTE — Progress Notes (Signed)
Telehealth Visit     Virtual Visit via Video Note   This visit type was conducted due to national recommendations for restrictions regarding the COVID-19 Pandemic (e.g. social distancing) in an effort to limit this patient's exposure and mitigate transmission in our community.  Due to her co-morbid illnesses, this patient is at least at moderate risk for complications without adequate follow up.  This format is felt to be most appropriate for this patient at this time.  All issues noted in this document were discussed and addressed.  A limited physical exam was performed with this format.  Please refer to the patient's chart for her consent to telehealth for [facility]   Evaluation Performed:  Follow-up visit  This visit type was conducted due to national recommendations for restrictions regarding the COVID-19 Pandemic (e.g. social distancing).  This format is felt to be most appropriate for this patient at this time.  All issues noted in this document were discussed and addressed.  No physical exam was performed (except for noted visual exam findings with Video Visits).  Please refer to the patient's chart (MyChart message for video visits and phone note for telephone visits) for the patient's consent to telehealth for [facility]  Date:  12/03/2018   ID:  [name], DOB Aug 13, 1965, MRN [id number]  Patient Location:  Home  Provider location:   Home  PCP:  Binnie Rail, MD  Cardiologist:  Gillian Shields (seen with Melina Copa 11/2016 as new patient) Electrophysiologist:  None   Chief Complaint:  Work in visit.   History of Present Illness:    Erin [name] is a 53 y.o. female who presents via audio/video conferencing for a telehealth visit today.  Seen for Dr. Johnsie Cancel.   Dr. Johnsie Cancel sees her husband Chrissie Noa.   She has a history of gestational DM/pre-DM, morbid obesity, anemia, menorrhagia, uterine leiomyoma, endometriosis, hypothyroidism (following tx for Graves  disease), and elevated BP.   She was seen as a new patient by Melina Copa, PA and Dr. Johnsie Cancel in 11/2016 for chest tightness and palpitations.  No prior cardiac history. In her family history - her paternal grandmother apparently died unexpectedly at 52 years old and they were told her heart "burst." Her paternal aunt died in a similar fashion. She has had 2 children die. She had a 77 year old special needs but their death was not expected following a vagus nerve stimulation surgery. Her chest pain was atypical. An event monitor and echo were ordered and were stable.  She did not keep her follow up.   The patient does not have symptoms concerning for COVID-19 infection (fever, chills, cough, or new shortness of breath).   Seen today by a telephone conversation. She has consented for this visit. She has become more anemic - needing EGD and colonoscopy. To start IV iron. Also getting biopsy for vaginal bleeding. Thyroid level is 8.72. She was seen by oncology/hematology earlier today - vitals are from that visit. BP is high. She has several complaints - this includes night sweats, chills, faint feeling. Says she needs "to rule out the heart". These complaints are chronic but now getting worse and more frequent. She has had palpitations - which she says is no different - but she notes now more of "pulling sensation/chest tightness". She feels a tightness in her chest - this will wake her up from sleep - normally between 2 to 4:30 AM (which involves the sweats/chills/fainty feeling and chest pulling) but for the  last 2 nights has had spells around 9PM while awake - while watching TV but no pulling sensation. Typically this happens almost every night now. The sweats start first, then gets nauseated - little lightheaded. Nothing exertional but she does not exercise. No real activity - she has bad knees. She is not short of breath. She has trouble with her thyroid replacement dose - she sees Dr. Letta Median for this. She  does not like the feeling she is going to pass out - she is scared she is going to die. She notes that she has trouble for her body to tolerate medicines. She is taking undefined amounts of NSAID. She has a BP cuff - but has not been checking.   Past Medical History:  Diagnosis Date   Abnormal uterine bleeding    Anemia    Depression    Diabetes mellitus without complication (HCC)    gestational   Elevated blood-pressure reading without diagnosis of hypertension    Endometriosis    Fibroids    GERD (gastroesophageal reflux disease)    Graves disease    Grief at loss of child    H/O gestational diabetes mellitus, not currently pregnant 09/28/2015   Hypertension    Hypothyroidism    Interstitial cystitis    Menorrhagia    Oropharyngeal candidiasis 01/16/2018   OSA (obstructive sleep apnea), severe 06/23/2018   Premature ventricular contractions    a. rare PVC by monitor 12/2016.   Right ovarian cyst    3 cm.   Thyroid disease    Uterine leiomyoma    Past Surgical History:  Procedure Laterality Date   APPENDECTOMY     ARTERY BIOPSY Left 10/25/2017   Procedure: BIOPSY TEMPORAL ARTERY;  Surgeon: Aviva Signs, MD;  Location: [facility];  Service: General;  Laterality: Left;   CESAREAN SECTION     CHOLECYSTECTOMY     HERNIA REPAIR     incisional   TUMOR REMOVAL  fibroids     Current Meds  Medication Sig   cholecalciferol (VITAMIN D) 1000 units tablet Take 1,000 Units by mouth daily.   Diclofenac Sodium (PENNSAID) 2 % SOLN Place 2 g onto the skin 2 (two) times daily.   diltiazem (CARDIZEM LA) 120 MG 24 hr tablet Take 1 tablet (120 mg total) by mouth daily.   famotidine (PEPCID) 20 MG tablet Take 1 tablet (20 mg total) by mouth 2 (two) times daily.   Fe Fum-FePoly-Vit C-Vit B3 (INTEGRA) 62.5-62.5-40-3 MG CAPS Take 1 capsule by mouth daily.   gabapentin (NEURONTIN) 100 MG capsule TAKE 2 CAPSULES (200 MG TOTAL) BY MOUTH AT BEDTIME.   ibuprofen  (ADVIL,MOTRIN) 400 MG tablet Take 1 tablet (400 mg total) by mouth every 6 (six) hours as needed for fever, headache or mild pain (use first).   levonorgestrel (MIRENA, 52 MG,) 20 MCG/24HR IUD 1 each by Intrauterine route once.    losartan (COZAAR) 50 MG tablet Take 1 tablet (50 mg total) by mouth daily.   methocarbamol (ROBAXIN) 500 MG tablet Take 1 tablet (500 mg total) by mouth every 6 (six) hours as needed for muscle spasms.   Multiple Vitamin (MULTIVITAMIN) tablet Take 1 tablet by mouth daily.   Olopatadine HCl (PATADAY) 0.2 % SOLN Place 1 drop into both eyes daily as needed (itching).    ondansetron (ZOFRAN ODT) 4 MG disintegrating tablet Take 1 tablet (4 mg total) by mouth every 8 (eight) hours as needed for nausea or vomiting.   Pseudoephedrine-Ibuprofen 30-200 MG TABS Take 2 tablets  by mouth every 6 (six) hours as needed (cough, headache, congestion and fever).   UNITHROID 112 MCG tablet Take 1 tablet (112 mcg total) by mouth daily before breakfast.     Allergies:   Prednisone; Prozac [fluoxetine hcl]; and Hydrocodone   Social History   Tobacco Use   Smoking status: Never Smoker   Smokeless tobacco: Never Used  Substance Use Topics   Alcohol use: No   Drug use: No     Family Hx: The patient's family history includes Breast cancer (age of onset: 58) in her mother; Cancer in her father and mother; Diabetes in her father; Hypertension in her father and mother; Ovarian cancer in her maternal aunt; Thyroid disease in her maternal aunt.  ROS:   Please see the history of present illness.   All other systems reviewed are negative.    Objective:    Vital Signs:  BP (!) 174/88    Pulse 91 Comment: vitals from earlier visit with oncology/hematology today   Ht 5\' 5"  (1.651 m)    Wt (!) 361 lb (163.7 kg)    BMI 60.07 kg/m    Wt Readings from Last 3 Encounters:  12/03/18 (!) 361 lb (163.7 kg)  12/03/18 (!) 361 lb 14.4 oz (164.2 kg)  12/01/18 (!) 357 lb (161.9 kg)     Alert female in no acute distress. She is obese. She is not short of breath with conversation.   Labs/Other Tests and Data Reviewed:    Lab Results  Component Value Date   WBC 11.8 (H) 10/24/2018   HGB 10.6 (L) 10/24/2018   HCT 32.7 (L) 10/24/2018   PLT 358.0 10/24/2018   GLUCOSE 137 (H) 10/24/2018   CHOL 224 (H) 01/18/2018   TRIG 145 01/18/2018   HDL 65 01/18/2018   LDLCALC 130 (H) 01/18/2018   ALT 17 10/24/2018   AST 14 10/24/2018   NA 142 10/24/2018   K 3.5 10/24/2018   CL 105 10/24/2018   CREATININE 0.84 10/24/2018   BUN 17 10/24/2018   CO2 29 10/24/2018   TSH 8.72 (H) 10/24/2018   HGBA1C 6.9 (H) 10/24/2018     BNP (last 3 results) No results for input(s): BNP in the last 8760 hours.  ProBNP (last 3 results) No results for input(s): PROBNP in the last 8760 hours.    Prior CV studies:    The following studies were reviewed today:   EKG from 07/2018 with sinus tach with PVCs noted along with poor R wave progression.    Echo Study Conclusions 12/2017  - Left ventricle: The cavity size was normal. There was mild   concentric hypertrophy. Systolic function was normal. The   estimated ejection fraction was in the range of 55% to 60%. Wall   motion was normal; there were no regional wall motion   abnormalities. Doppler parameters are consistent with abnormal   left ventricular relaxation (grade 1 diastolic dysfunction). - Aortic valve: Transvalvular velocity was within the normal range.   There was no stenosis. There was no regurgitation. - Mitral valve: Transvalvular velocity was within the normal range.   There was no evidence for stenosis. There was no regurgitation. - Right ventricle: The cavity size was normal. Wall thickness was   normal. Systolic function was normal. - Tricuspid valve: There was no regurgitation. - Pericardium, extracardiac: A small pericardial effusion was   identified. There was evidence for increased RV-LV interaction    demonstrated by respirophasic changes in tricuspid velocities.  Impressions:  - Endomyocardial  border definition is poor. Systolic function   appears grossly normal. Repeat echo with Definity contrast if   clinically indicated.   Event Monitor Study Highlights 12/2016  NSR Isolated PVCls Symptoms do not correlate with PVCls all the time No significant arrhythmias    Notes recorded by Josue Hector, MD on 01/22/2017 at 10:10 AM EDT Isolated PVC;s no significant arrhythmia  Echo Study Conclusions 12/2016  - Left ventricle: The cavity size was normal. Wall thickness was   increased in a pattern of mild LVH. Systolic function was normal.   The estimated ejection fraction was in the range of 60% to 65%.   Wall motion was normal; there were no regional wall motion   abnormalities. Doppler parameters are consistent with abnormal   left ventricular relaxation (grade 1 diastolic dysfunction). - Aortic valve: There was trivial regurgitation.  ASSESSMENT & PLAN:    1. Multitude of chronic somatic complaints which include chest tightness/nightsweats/chills/nausea/presyncope - increasing frequency - multiple CV risk factors - abnormal resting EKG noted. She had prior benign evaluation in 2018 and stable echo from 2019. Will arrange for 7 monitor and arrange 2 day Lexiscan Myoview - her weight will be a limiting factor.   2. DM - prior admission with DKA in 2019  3. Morbid obesity   4. HTN -  Using lots of NSAID - advised her to change to Tylenol. She has a BP cuff - she is to monitor.    5. Need for EGD/colonoscopy - needing clearance from [place] - further disposition to follow.   6. Thyroid disorder - most recent TSH with hypothyroidism - she will discuss with Endocrine.    7. Palpitations - long standing - now with feelings of presyncope- will check 7 day monitor.   8. COVID-19 Education: The signs and symptoms of COVID-19 were discussed with the patient and how to seek care for  testing (follow up with PCP or arrange E-visit).  The importance of social distancing, staying at home, hand hygiene and wearing a mask when out in public were discussed today.  Patient Risk:   After full review of this patient's clinical status, I feel that they are at least moderate risk at this time.  Time:   Today, I have spent 15 minutes with the patient with telehealth technology discussing the above issues.     Medication Adjustments/Labs and Tests Ordered: Current medicines are reviewed at length with the patient today.  Concerns regarding medicines are outlined above.   Tests Ordered: No orders of the defined types were placed in this encounter.   Medication Changes: No orders of the defined types were placed in this encounter.   Disposition:  Further disposition to follow.   Patient is agreeable to this plan and will call if any problems develop in the interim.   Amie Critchley, NP  12/03/2018 1:19 PM    [facility]

## 2018-12-02 NOTE — Telephone Encounter (Signed)
Patient informed and Rx sent to pharmacy.

## 2018-12-02 NOTE — Telephone Encounter (Signed)
I have notified requesting office that pt has a visit with [place] tomorrow and we will address cardiac clearance and let them know.

## 2018-12-02 NOTE — Telephone Encounter (Signed)
Patient states she realized after her visit yesterday that she has taken Pantoprazole in the past while she was taking prednisone and it did not work for her. It actually made her feel worse. Please advise an alternative.

## 2018-12-03 ENCOUNTER — Inpatient Hospital Stay: Payer: Self-pay | Attending: Internal Medicine | Admitting: Internal Medicine

## 2018-12-03 ENCOUNTER — Inpatient Hospital Stay: Payer: Self-pay

## 2018-12-03 ENCOUNTER — Other Ambulatory Visit: Payer: Self-pay

## 2018-12-03 ENCOUNTER — Telehealth: Payer: Self-pay | Admitting: Radiology

## 2018-12-03 ENCOUNTER — Encounter: Payer: Self-pay | Admitting: Nurse Practitioner

## 2018-12-03 ENCOUNTER — Telehealth (INDEPENDENT_AMBULATORY_CARE_PROVIDER_SITE_OTHER): Payer: Self-pay | Admitting: Nurse Practitioner

## 2018-12-03 ENCOUNTER — Other Ambulatory Visit: Payer: Self-pay | Admitting: Internal Medicine

## 2018-12-03 VITALS — BP 174/88 | HR 91 | Ht 65.0 in | Wt 361.0 lb

## 2018-12-03 VITALS — BP 146/88 | HR 91 | Temp 98.3°F | Resp 20 | Ht 65.0 in | Wt 361.9 lb

## 2018-12-03 DIAGNOSIS — I10 Essential (primary) hypertension: Secondary | ICD-10-CM

## 2018-12-03 DIAGNOSIS — E0789 Other specified disorders of thyroid: Secondary | ICD-10-CM

## 2018-12-03 DIAGNOSIS — R0789 Other chest pain: Secondary | ICD-10-CM

## 2018-12-03 DIAGNOSIS — D508 Other iron deficiency anemias: Secondary | ICD-10-CM

## 2018-12-03 DIAGNOSIS — E039 Hypothyroidism, unspecified: Secondary | ICD-10-CM | POA: Insufficient documentation

## 2018-12-03 DIAGNOSIS — D509 Iron deficiency anemia, unspecified: Secondary | ICD-10-CM | POA: Insufficient documentation

## 2018-12-03 DIAGNOSIS — R55 Syncope and collapse: Secondary | ICD-10-CM

## 2018-12-03 DIAGNOSIS — Z7189 Other specified counseling: Secondary | ICD-10-CM

## 2018-12-03 DIAGNOSIS — Z01818 Encounter for other preprocedural examination: Secondary | ICD-10-CM

## 2018-12-03 DIAGNOSIS — R002 Palpitations: Secondary | ICD-10-CM

## 2018-12-03 DIAGNOSIS — R079 Chest pain, unspecified: Secondary | ICD-10-CM

## 2018-12-03 DIAGNOSIS — E119 Type 2 diabetes mellitus without complications: Secondary | ICD-10-CM

## 2018-12-03 DIAGNOSIS — M25559 Pain in unspecified hip: Secondary | ICD-10-CM

## 2018-12-03 DIAGNOSIS — R102 Pelvic and perineal pain: Secondary | ICD-10-CM | POA: Insufficient documentation

## 2018-12-03 DIAGNOSIS — N939 Abnormal uterine and vaginal bleeding, unspecified: Secondary | ICD-10-CM | POA: Insufficient documentation

## 2018-12-03 NOTE — Patient Instructions (Addendum)
After Visit Summary:  We will be checking the following labs today - NONE   Medication Instructions:    Continue with your current medicines.  You need to cut back on your Ibuprofen/Advil usage - this makes your BP elevated and increases your risk for bleeding.     If you need a refill on your cardiac medications before your next appointment, please call your pharmacy.     Testing/Procedures To Be Arranged:  2 day Lexiscan  Event monitor - this will be mailed to your house.   Follow-Up:   Let's see how your studies turn out and then we will decide about your follow up.     At [facility], you and your health needs are our priority.  As part of our continuing mission to provide you with exceptional heart care, we have created designated Provider Care Teams.  These Care Teams include your primary Cardiologist (physician) and Advanced Practice Providers (APPs -  Physician Assistants and Nurse Practitioners) who all work together to provide you with the care you need, when you need it.  Special Instructions:  . Stay safe, stay home, wash your hands for at least 20 seconds and wear a mask when out in public.  . It was good to talk with you today.  [name] Keep track of your BP for [place] . Talk with Dr. Letta Median about the elevated TSH level   Call the [place] office at [phone] if you have any questions, problems or concerns.

## 2018-12-03 NOTE — Progress Notes (Signed)
Referring Physician:  Dr. Everardo All. Yisroel Ramming and Binnie Rail, MD  Diagnosis Other iron deficiency anemia - Plan: CBC with Differential (Cancer Center Only), CMP ([place] only), Lactate dehydrogenase (LDH), Ferritin, Iron and TIBC, Vitamin B12, Folate, Serum, Methylmalonic acid, serum, Hemoglobinopathy evaluation, SPEP with reflex to IFE, ANA, IFA (with reflex), Rheumatoid factor, Sedimentation rate  Pain in joint involving pelvic region and thigh, unspecified laterality - Plan: CBC with Differential ([facility] Only), CMP ([facility] only), Lactate dehydrogenase (LDH), Ferritin, Iron and TIBC, Vitamin B12, Folate, Serum, Methylmalonic acid, serum, Hemoglobinopathy evaluation, SPEP with reflex to IFE, ANA, IFA (with reflex), Rheumatoid factor, Sedimentation rate  Staging Cancer Staging No matching staging information was found for the patient.  Assessment and Plan:  1.  Iron deficiency anemia ( IDA).   53 year old female referred for evaluation due to anemia.  Pt reports occasional bloody vaginal discharge.  She reports she is seeing GYN and is scheduled for biopsy.  She has not taken oral iron for a year due to lack of tolerance for medication.  She has arthritis and has brace on left LE.   Labs done 10/24/2018 reviewed and showed WBC 11.8 HB 10.6 MCV 78 plts 358,000.  Chemistries showed K+ 3.5 Cr 0.84 and normal LFTs.  Ferritin 17.  Pt reports problems with nausea.  Pt reportedly scheduled for GI evaluation for EGD and colonoscopy. Pt is seen today for consultation due to iron deficiency anemia.    Pt has HB 10.6 with MCV of 78 and ferritin of 17 that is consistent with IDA.  Pt has intolerance to oral iron.  She also reports bouts of significant nausea.  Pt is reportedly scheduled for GI evaluation.  I discussed with pt option of Feraheme 510 mg IV D1 and D8.  Side effects of IV iron reviewed and include possible allergic reaction.  Pt will be given a trial of IV iron and will RTC  in 12/2018 for repeat labs after IV iron.  She is advised to follow-up with GI as recommended.  I have also discussed with her recent TSH was 8.72 which may also contribute to anemia.    2.  Uterine bleeding.  Pt should follow-up with GYN for evaluation.    3.  Nausea.  Pt is reportedly scheduled for GI evaluation.    4.  Hypothyroidism.  Recent TSH done 10/24/2018 was 8.72.  Pt should follow-up with PCP for management and monitoring as hypothyroidism may also contribute to anemia.    5.  Joint pain.  Pt has left knee brace.  She has been diagnosed with arthritis.   Follow-up with PCP for management.  Will check ANA, RF, Sed rate and SPEP on RTC.   6.  Hypertension.  BP is 146/88.  Follow-up with PCP for monitoring.    7.  Health maintenance.  GI follow-up and mammogram screenings as recommended by PCP.    40 minutes spent with more than 50% spent in review of records, counseling and coordination of care.     HPI:  53 year old female referred for evaluation due to anemia.  Pt reports occasional bloody vaginal discharge.  She reports she is seeing GYN and is scheduled for biopsy.  She has not taken oral iron for a year due to reported lack of tolerance for medication.  She has arthritis and has brace on left LE.   Labs done 10/24/2018 reviewed and showed WBC 11.8 HB 10.6 MCV 78 plts 358,000.  Chemistries showed K+ 3.5 Cr 0.84 and normal LFTs.  Ferritin 17.  Pt reports problems with nausea.  Pt reportedly scheduled for GI evaluation for EGD and colonoscopy. Pt is seen today for consultation due to iron deficiency anemia.    Problem List Patient Active Problem List   Diagnosis Date Noted  . Patellofemoral arthritis of left knee [M17.12] 11/28/2018  . Left knee pain [M25.562] 11/24/2018  . Lightheadedness [R42] 10/24/2018  . Metrorrhagia [N92.1] 10/24/2018  . Lumbar back pain with radiculopathy affecting left lower extremity [M54.16] 10/07/2018  . Right ovarian cyst [N83.201]   . OSA (obstructive  sleep apnea), severe [G47.33] 06/23/2018  . Anxiety [F41.9] 03/05/2018  . Chronic nonintractable headache [R51] 03/05/2018  . Hyperlipidemia [E78.5] 01/23/2018  . Elevated liver enzymes [R74.8]   . Diabetes mellitus type 2, uncomplicated ([place]) [Z61.0] 01/16/2018  . DKA (diabetic ketoacidoses) ([place]) [E11.10] 01/15/2018  . Gastroesophageal reflux disease [K21.9] 10/05/2017  . Temporal arteritis ([place]) [M31.6] 10/03/2017  . Right foot pain [M79.671] 07/15/2017  . Grief at loss of child [F43.21, Z63.4] 11/02/2016  . Hypertension [I10] 09/28/2015  . Morbid obesity ([place]) [E66.01] 05/17/2015  . Umbilical hernia [R60.4] 05/17/2015  . Chronic lower back pain [M54.5, G89.29] 05/17/2015  . Snoring [R06.83] 05/17/2015  . Palpitations [R00.2] 05/17/2015  . ANEMIA [D64.9] 09/03/2007  . Hypothyroidism following radioiodine therapy [E89.0] 09/02/2007  . INTERSTITIAL CYSTITIS [N30.10] 09/01/2007  . ALLERGIC RHINITIS [J30.9] 08/07/2007  . DISC DISEASE, LUMBAR [M51.37] 08/07/2007    Past Medical History Past Medical History:  Diagnosis Date  . Abnormal uterine bleeding   . Anemia   . Depression   . Diabetes mellitus without complication (HCC)    gestational  . Elevated blood-pressure reading without diagnosis of hypertension   . Endometriosis   . Fibroids   . GERD (gastroesophageal reflux disease)   . Graves disease   . Grief at loss of child   . H/O gestational diabetes mellitus, not currently pregnant 09/28/2015  . Hypertension   . Hypothyroidism   . Interstitial cystitis   . Menorrhagia   . Oropharyngeal candidiasis 01/16/2018  . OSA (obstructive sleep apnea), severe 06/23/2018  . Premature ventricular contractions    a. rare PVC by monitor 12/2016.  . Right ovarian cyst    3 cm.  . Thyroid disease   . Uterine leiomyoma     Past Surgical History Past Surgical History:  Procedure Laterality Date  . APPENDECTOMY    . ARTERY BIOPSY Left 10/25/2017   Procedure: BIOPSY TEMPORAL ARTERY;   Surgeon: Aviva Signs, MD;  Location: [facility];  Service: General;  Laterality: Left;  . CESAREAN SECTION    . CHOLECYSTECTOMY    . HERNIA REPAIR     incisional  . TUMOR REMOVAL  fibroids    Family History Family History  Problem Relation Age of Onset  . Hypertension Mother   . Cancer Mother   . Breast cancer Mother 36  . Hypertension Father   . Diabetes Father   . Cancer Father   . Ovarian cancer Maternal Aunt   . Thyroid disease Maternal Aunt        hypothyroid     Social History  reports that she has never smoked. She has never used smokeless tobacco. She reports that she does not drink alcohol or use drugs.  Medications  Current Outpatient Medications:  .  cholecalciferol (VITAMIN D) 1000 units tablet, Take 1,000 Units by mouth daily., Disp: , Rfl:  .  Diclofenac Sodium (PENNSAID) 2 %  SOLN, Place 2 g onto the skin 2 (two) times daily., Disp: 112 g, Rfl: 3 .  diltiazem (CARDIZEM LA) 120 MG 24 hr tablet, Take 1 tablet (120 mg total) by mouth daily., Disp: 90 tablet, Rfl: 1 .  Fe Fum-FePoly-Vit C-Vit B3 (INTEGRA) 62.5-62.5-40-3 MG CAPS, Take 1 capsule by mouth daily., Disp: 90 capsule, Rfl: 3 .  gabapentin (NEURONTIN) 100 MG capsule, TAKE 2 CAPSULES (200 MG TOTAL) BY MOUTH AT BEDTIME., Disp: 180 capsule, Rfl: 2 .  ibuprofen (ADVIL,MOTRIN) 400 MG tablet, Take 1 tablet (400 mg total) by mouth every 6 (six) hours as needed for fever, headache or mild pain (use first)., Disp: 30 tablet, Rfl: 0 .  levonorgestrel (MIRENA, 52 MG,) 20 MCG/24HR IUD, 1 each by Intrauterine route once. , Disp: , Rfl:  .  losartan (COZAAR) 50 MG tablet, Take 1 tablet (50 mg total) by mouth daily., Disp: 90 tablet, Rfl: 3 .  methocarbamol (ROBAXIN) 500 MG tablet, Take 1 tablet (500 mg total) by mouth every 6 (six) hours as needed for muscle spasms., Disp: 90 tablet, Rfl: 0 .  Multiple Vitamin (MULTIVITAMIN) tablet, Take 1 tablet by mouth daily., Disp: , Rfl:  .  Olopatadine HCl (PATADAY) 0.2 % SOLN, Place 1  drop into both eyes daily as needed (itching). , Disp: , Rfl:  .  ondansetron (ZOFRAN ODT) 4 MG disintegrating tablet, Take 1 tablet (4 mg total) by mouth every 8 (eight) hours as needed for nausea or vomiting., Disp: 40 tablet, Rfl: 1 .  Pseudoephedrine-Ibuprofen 30-200 MG TABS, Take 2 tablets by mouth every 6 (six) hours as needed (cough, headache, congestion and fever)., Disp: , Rfl:  .  UNITHROID 112 MCG tablet, Take 1 tablet (112 mcg total) by mouth daily before breakfast., Disp: 45 tablet, Rfl: 5 .  famotidine (PEPCID) 20 MG tablet, Take 1 tablet (20 mg total) by mouth 2 (two) times daily. (Patient not taking: Reported on 12/03/2018), Disp: 60 tablet, Rfl: 3  Allergies Prednisone; Prozac [fluoxetine hcl]; and Hydrocodone  Review of Systems Review of Systems - Oncology ROS negative other than vaginal bleeding and joint pain.     Physical Exam  Vitals Wt Readings from Last 3 Encounters:  12/03/18 (!) 361 lb 14.4 oz (164.2 kg)  12/01/18 (!) 357 lb (161.9 kg)  11/28/18 (!) 356 lb (161.5 kg)   Temp Readings from Last 3 Encounters:  12/03/18 98.3 F (36.8 C) (Oral)  11/24/18 98.1 F (36.7 C) (Oral)  11/13/18 97.6 F (36.4 C)   BP Readings from Last 3 Encounters:  12/03/18 (!) 146/88  11/28/18 124/70  11/24/18 (!) 142/74   Pulse Readings from Last 3 Encounters:  12/03/18 91  11/28/18 97  11/24/18 81   Constitutional: Obese, in no distress.   HENT: Head: Normocephalic and atraumatic.  Mouth/Throat: No oropharyngeal exudate. Mucosa moist. Eyes: Pupils are equal, round, and reactive to light. Conjunctivae are normal. No scleral icterus.  Neck: Normal range of motion. Neck supple. No JVD present.  Cardiovascular: Normal rate, regular rhythm and normal heart sounds.  Exam reveals no gallop and no friction rub.   No murmur heard. Pulmonary/Chest: Effort normal and breath sounds normal. No respiratory distress. No wheezes.No rales.  Abdominal: Soft. Bowel sounds are normal. No  distension. There is no tenderness. There is no guarding.  Musculoskeletal: Brace on left knee.   Lymphadenopathy: No cervical,axillary or supraclavicular adenopathy.  Neurological: Alert and oriented to person, place, and time. No cranial nerve deficit.  Skin: Skin is warm and  dry. No rash noted. No erythema. No pallor.  Psychiatric: Affect and judgment normal.   Labs No visits with results within 3 Day(s) from this visit.  Latest known visit with results is:  Office Visit on 11/13/2018  Component Date Value Ref Range Status  . Estradiol 11/13/2018 14.5  pg/mL Final   Comment:                     Adult Female:                       Follicular phase   07.6 -   166.0                       Ovulation phase    85.8 -   498.0                       Luteal phase       43.8 -   211.0                       Postmenopausal     <6.0 -    54.7                     Pregnancy                       1st trimester     215.0 - >4300.0                     Girls (1-10 years)    6.0 -    27.0 Roche ECLIA methodology   . [facility] 11/13/2018 35.5  mIU/mL Final   Comment:                     Adult Female:                       Follicular phase      3.5 -  12.5                       Ovulation phase       4.7 -  21.5                       Luteal phase          1.7 -   7.7                       Postmenopausal       25.8 - 134.8      Pathology Orders Placed This Encounter  Procedures  . CBC with Differential (Cancer Center Only)    Standing Status:   Future    Standing Expiration Date:   12/03/2019  . CMP ([facility] only)    Standing Status:   Future    Standing Expiration Date:   12/03/2019  . Lactate dehydrogenase (LDH)    Standing Status:   Future    Standing Expiration Date:   12/03/2019  . Ferritin    Standing Status:   Future    Standing Expiration Date:   12/03/2019  . Iron and TIBC    Standing Status:   Future    Standing Expiration Date:   12/03/2019  . Vitamin B12    Standing  Status:    Future    Standing Expiration Date:   12/03/2019  . Folate, Serum    Standing Status:   Future    Standing Expiration Date:   12/03/2019  . Methylmalonic acid, serum    Standing Status:   Future    Standing Expiration Date:   12/03/2019  . Hemoglobinopathy evaluation    Standing Status:   Future    Standing Expiration Date:   12/03/2019  . SPEP with reflex to IFE    Standing Status:   Future    Standing Expiration Date:   12/03/2019  . ANA, IFA (with reflex)    Standing Status:   Future    Standing Expiration Date:   12/03/2019  . Rheumatoid factor    Standing Status:   Future    Standing Expiration Date:   12/03/2019  . Sedimentation rate    Standing Status:   Future    Standing Expiration Date:   12/03/2019       Zoila Shutter MD

## 2018-12-03 NOTE — Telephone Encounter (Signed)
Enrolled patient for a 7 day Zio monitor to be mailed. Brief instructions were gone over and patient knows to expect the monitor to arrive in 3-4 days *patient is currently self pay and was instructed to talk to [facility] about selfpay/hardship options

## 2018-12-04 ENCOUNTER — Telehealth: Payer: Self-pay | Admitting: Internal Medicine

## 2018-12-04 ENCOUNTER — Encounter: Payer: BLUE CROSS/BLUE SHIELD | Admitting: Internal Medicine

## 2018-12-04 NOTE — Telephone Encounter (Signed)
I talk with patient regarding schedule  

## 2018-12-08 ENCOUNTER — Ambulatory Visit (INDEPENDENT_AMBULATORY_CARE_PROVIDER_SITE_OTHER): Payer: Self-pay

## 2018-12-08 DIAGNOSIS — R55 Syncope and collapse: Secondary | ICD-10-CM

## 2018-12-09 ENCOUNTER — Ambulatory Visit: Payer: BLUE CROSS/BLUE SHIELD | Admitting: Family

## 2018-12-09 ENCOUNTER — Other Ambulatory Visit: Payer: BLUE CROSS/BLUE SHIELD

## 2018-12-12 ENCOUNTER — Telehealth (HOSPITAL_COMMUNITY): Payer: Self-pay | Admitting: *Deleted

## 2018-12-12 ENCOUNTER — Inpatient Hospital Stay: Payer: Self-pay

## 2018-12-12 ENCOUNTER — Other Ambulatory Visit: Payer: Self-pay

## 2018-12-12 VITALS — BP 123/68 | HR 74 | Temp 98.3°F | Resp 20

## 2018-12-12 DIAGNOSIS — D508 Other iron deficiency anemias: Secondary | ICD-10-CM

## 2018-12-12 MED ORDER — SODIUM CHLORIDE 0.9 % IV SOLN
510.0000 mg | Freq: Once | INTRAVENOUS | Status: AC
  Administered 2018-12-12: 510 mg via INTRAVENOUS
  Filled 2018-12-12: qty 17

## 2018-12-12 MED ORDER — SODIUM CHLORIDE 0.9 % IV SOLN
Freq: Once | INTRAVENOUS | Status: AC
  Administered 2018-12-12: 09:00:00 via INTRAVENOUS
  Filled 2018-12-12: qty 250

## 2018-12-12 NOTE — Telephone Encounter (Signed)
Patient given detailed instructions per Myocardial Perfusion Study Information Sheet for the test on 12/17/18 at 7:30. Patient notified to arrive 15 minutes early and that it is imperative to arrive on time for appointment to keep from having the test rescheduled.  If you need to cancel or reschedule your appointment, please call the office within 24 hours of your appointment. . Patient verbalized understanding.Erin [name]

## 2018-12-12 NOTE — Patient Instructions (Signed)
Ferumoxytol injection What is this medicine? FERUMOXYTOL is an iron complex. Iron is used to make healthy red blood cells, which carry oxygen and nutrients throughout the body. This medicine is used to treat iron deficiency anemia. This medicine may be used for other purposes; ask your health care provider or pharmacist if you have questions. COMMON BRAND NAME(S): Feraheme What should I tell my health care provider before I take this medicine? They need to know if you have any of these conditions: -anemia not caused by low iron levels -high levels of iron in the blood -magnetic resonance imaging (MRI) test scheduled -an unusual or allergic reaction to iron, other medicines, foods, dyes, or preservatives -pregnant or trying to get pregnant -breast-feeding How should I use this medicine? This medicine is for injection into a vein. It is given by a health care professional in a hospital or clinic setting. Talk to your pediatrician regarding the use of this medicine in children. Special care may be needed. Overdosage: If you think you have taken too much of this medicine contact a poison control center or emergency room at once. NOTE: This medicine is only for you. Do not share this medicine with others. What if I miss a dose? It is important not to miss your dose. Call your doctor or health care professional if you are unable to keep an appointment. What may interact with this medicine? This medicine may interact with the following medications: -other iron products This list may not describe all possible interactions. Give your health care provider a list of all the medicines, herbs, non-prescription drugs, or dietary supplements you use. Also tell them if you smoke, drink alcohol, or use illegal drugs. Some items may interact with your medicine. What should I watch for while using this medicine? Visit your doctor or healthcare professional regularly. Tell your doctor or healthcare professional  if your symptoms do not start to get better or if they get worse. You may need blood work done while you are taking this medicine. You may need to follow a special diet. Talk to your doctor. Foods that contain iron include: whole grains/cereals, dried fruits, beans, or peas, leafy green vegetables, and organ meats (liver, kidney). What side effects may I notice from receiving this medicine? Side effects that you should report to your doctor or health care professional as soon as possible: -allergic reactions like skin rash, itching or hives, swelling of the face, lips, or tongue -breathing problems -changes in blood pressure -feeling faint or lightheaded, falls -fever or chills -flushing, sweating, or hot feelings -swelling of the ankles or feet Side effects that usually do not require medical attention (report to your doctor or health care professional if they continue or are bothersome): -diarrhea -headache -nausea, vomiting -stomach pain This list may not describe all possible side effects. Call your doctor for medical advice about side effects. You may report side effects to FDA at 1-800-FDA-1088. Where should I keep my medicine? This drug is given in a hospital or clinic and will not be stored at home. NOTE: This sheet is a summary. It may not cover all possible information. If you have questions about this medicine, talk to your doctor, pharmacist, or health care provider.  2019 Elsevier/Gold Standard (2016-07-30 20:21:10)    Iron Deficiency Anemia, Adult Iron-deficiency anemia is when you have a low amount of red blood cells or hemoglobin. This happens because you have too little iron in your body. Hemoglobin carries oxygen to parts of the body. Anemia  can cause your body to not get enough oxygen. It may or may not cause symptoms. Follow these instructions at home: Medicines  Take over-the-counter and prescription medicines only as told by your doctor. This includes iron pills  (supplements) and vitamins.  If you cannot handle taking iron pills by mouth, ask your doctor about getting iron through: ? A vein (intravenously). ? A shot (injection) into a muscle.  Take iron pills when your stomach is empty. If you cannot handle this, take them with food.  Do not drink milk or take antacids at the same time as your iron pills.  To prevent trouble pooping (constipation), eat fiber or take medicine (stool softener) as told by your doctor. Eating and drinking   Talk with your doctor before changing the foods you eat. He or she may tell you to eat foods that have a lot of iron, such as: ? Liver. ? Lowfat (lean) beef. ? Breads and cereals that have iron added to them (fortified breads and cereals). ? Eggs. ? Dried fruit. ? Dark green, leafy vegetables.  Drink enough fluid to keep your pee (urine) clear or pale yellow.  Eat fresh fruits and vegetables that are high in vitamin C. They help your body to use iron. Foods with a lot of vitamin C include: ? Oranges. ? Peppers. ? Tomatoes. ? Mangoes. General instructions  Return to your normal activities as told by your doctor. Ask your doctor what activities are safe for you.  Keep yourself clean, and keep things clean around you (your surroundings). Anemia can make you get sick more easily.  Keep all follow-up visits as told by your doctor. This is important. Contact a doctor if:  You feel sick to your stomach (nauseous).  You throw up (vomit).  You feel weak.  You are sweating for no clear reason.  You have trouble pooping, such as: ? Pooping (having a bowel movement) less than 3 times a week. ? Straining to poop. ? Having poop that is hard, dry, or larger than normal. ? Feeling full or bloated. ? Pain in the lower belly. ? Not feeling better after pooping. Get help right away if:  You pass out (faint). If this happens, do not drive yourself to the hospital. Call your local emergency services (911 in  the [place]  You have chest pain.  You have shortness of breath that: ? Is very bad. ? Gets worse with physical activity.  You have a fast heartbeat.  You get light-headed when getting up from sitting or lying down. This information is not intended to replace advice given to you by your health care provider. Make sure you discuss any questions you have with your health care provider. Document Released: 07/14/2010 Document Revised: 02/29/2016 Document Reviewed: 02/29/2016 Elsevier Interactive Patient Education  2019 [employer].

## 2018-12-17 ENCOUNTER — Ambulatory Visit (HOSPITAL_COMMUNITY): Payer: Self-pay | Attending: Cardiology

## 2018-12-17 ENCOUNTER — Other Ambulatory Visit: Payer: Self-pay

## 2018-12-17 DIAGNOSIS — R079 Chest pain, unspecified: Secondary | ICD-10-CM | POA: Insufficient documentation

## 2018-12-17 MED ORDER — REGADENOSON 0.4 MG/5ML IV SOLN
0.4000 mg | Freq: Once | INTRAVENOUS | Status: AC
  Administered 2018-12-17: 0.4 mg via INTRAVENOUS

## 2018-12-17 MED ORDER — TECHNETIUM TC 99M TETROFOSMIN IV KIT
33.0000 | PACK | Freq: Once | INTRAVENOUS | Status: AC | PRN
  Administered 2018-12-17: 33 via INTRAVENOUS
  Filled 2018-12-17: qty 33

## 2018-12-18 ENCOUNTER — Ambulatory Visit (HOSPITAL_COMMUNITY): Payer: Self-pay | Attending: Internal Medicine

## 2018-12-18 LAB — MYOCARDIAL PERFUSION IMAGING
LV dias vol: 125 mL (ref 46–106)
LV sys vol: 76 mL
Peak HR: 114 {beats}/min
Rest HR: 77 {beats}/min
SDS: 1
SRS: 3
SSS: 4
TID: 1.03

## 2018-12-18 MED ORDER — TECHNETIUM TC 99M TETROFOSMIN IV KIT
32.1000 | PACK | Freq: Once | INTRAVENOUS | Status: AC | PRN
  Administered 2018-12-18: 32.1 via INTRAVENOUS
  Filled 2018-12-18: qty 33

## 2018-12-19 ENCOUNTER — Inpatient Hospital Stay: Payer: Self-pay

## 2018-12-19 ENCOUNTER — Telehealth: Payer: Self-pay | Admitting: Obstetrics and Gynecology

## 2018-12-19 ENCOUNTER — Other Ambulatory Visit: Payer: Self-pay | Admitting: Internal Medicine

## 2018-12-19 NOTE — Telephone Encounter (Signed)
Call placed to convey benefits for recommended endometrial biopsy.

## 2018-12-22 ENCOUNTER — Other Ambulatory Visit: Payer: Self-pay | Admitting: *Deleted

## 2018-12-22 ENCOUNTER — Encounter: Payer: Self-pay | Admitting: Internal Medicine

## 2018-12-22 DIAGNOSIS — R931 Abnormal findings on diagnostic imaging of heart and coronary circulation: Secondary | ICD-10-CM

## 2018-12-22 NOTE — Telephone Encounter (Signed)
Pt called to check refill status, please advise. Pt is out of medication

## 2018-12-22 NOTE — Telephone Encounter (Signed)
Routing to CMA 

## 2018-12-24 ENCOUNTER — Encounter: Payer: Self-pay | Admitting: Pharmacy Technician

## 2018-12-24 ENCOUNTER — Other Ambulatory Visit: Payer: Self-pay | Admitting: Internal Medicine

## 2018-12-24 ENCOUNTER — Telehealth: Payer: Self-pay | Admitting: *Deleted

## 2018-12-24 ENCOUNTER — Ambulatory Visit: Payer: Self-pay | Admitting: Neurology

## 2018-12-24 ENCOUNTER — Telehealth: Payer: Self-pay | Admitting: Internal Medicine

## 2018-12-24 DIAGNOSIS — U071 COVID-19: Secondary | ICD-10-CM

## 2018-12-24 HISTORY — DX: COVID-19: U07.1

## 2018-12-24 NOTE — Progress Notes (Signed)
Virtual Visit via Video Note  I connected with [name] on 12/25/18 at  4:15 PM EDT by a video enabled telemedicine application and verified that I am speaking with the correct person using two identifiers.  Location: Patient: Patient was at home and suddenly Provider: in office setting    I discussed the limitations of evaluation and management by telemedicine and the availability of in person appointments. The patient expressed understanding and agreed to proceed.  History of Present Illness: Patient is a 53 year old female who is coming in with patellofemoral arthritis.  Continue to have unfortunately some pain in the knee but would state 85% better.  Patient does states that he continues to have difficulty with ambulating but is using the brace more.  Feels like there is unfortunately some swelling occurring.  Sometimes feels that the knee is unstable.    Observations/Objective: Patient appears to be in good spirit but was not walking.  She is sitting    Assessment and Plan: OF arthritis worse with walking. In brace doing HEP  Now still having trouble with walking.  RTC for visco supplementation after approval  Declined PT or steroid injections because she states had it before        I discussed the assessment and treatment plan with the patient. The patient was provided an opportunity to ask questions and all were answered. The patient agreed with the plan and demonstrated an understanding of the instructions.   The patient was advised to call back or seek an in-person evaluation if the symptoms worsen or if the condition fails to improve as anticipated.  I provided 26 minutes of non-face-to-face time during this encounter.   Lyndal Pulley, DO

## 2018-12-24 NOTE — Telephone Encounter (Signed)
MEDICATION: Unithroid 100 MCG   PHARMACY:  [facility] on [address]  IS THIS A 90 DAY SUPPLY : yes  IS PATIENT OUT OF MEDICATION:   IF NOT; HOW MUCH IS LEFT:   LAST APPOINTMENT DATE: @7 /06/2018  NEXT APPOINTMENT DATE:@7 /02/2019  DO WE HAVE YOUR PERMISSION TO LEAVE A DETAILED MESSAGE: yes  OTHER COMMENTS: patient states is taking 100 MCG not 112 MCG   **Let patient know to contact pharmacy at the end of the day to make sure medication is ready. **  ** Please notify patient to allow 48-72 hours to process**  **Encourage patient to contact the pharmacy for refills or they can request refills through [facility]**

## 2018-12-24 NOTE — Progress Notes (Signed)
The patient is approved for drug assistance by [employer] for Feraheme. Enrollment is effective until 12/17/19 and is based on self pay. Drug replacement begins on DOS 12/12/18.

## 2018-12-24 NOTE — Telephone Encounter (Signed)
Received call from patient regarding a past appt.  Pt states she originally had an Iron infusion appt on 12/19/18 but she states it was cancelled. On her MyChart it is listed as a "no show". She would like this corrected and then an appt made ASAP for this 2nd Iron infusion. Scheduling message sent.

## 2018-12-25 ENCOUNTER — Encounter: Payer: Self-pay | Admitting: Family Medicine

## 2018-12-25 ENCOUNTER — Ambulatory Visit: Payer: Self-pay | Admitting: Internal Medicine

## 2018-12-25 ENCOUNTER — Ambulatory Visit (INDEPENDENT_AMBULATORY_CARE_PROVIDER_SITE_OTHER): Payer: Self-pay | Admitting: Family Medicine

## 2018-12-25 DIAGNOSIS — M1712 Unilateral primary osteoarthritis, left knee: Secondary | ICD-10-CM

## 2018-12-25 MED ORDER — UNITHROID 100 MCG PO TABS: 100.0000 ug | ORAL_TABLET | Freq: Every day | ORAL | 2 refills | Status: DC

## 2018-12-25 NOTE — Telephone Encounter (Signed)
RX sent

## 2018-12-25 NOTE — Addendum Note (Signed)
Addended by: Cardell Peach I on: 12/25/2018 02:07 PM   Modules accepted: Orders

## 2018-12-29 ENCOUNTER — Other Ambulatory Visit: Payer: Self-pay

## 2018-12-29 ENCOUNTER — Inpatient Hospital Stay: Payer: Self-pay | Attending: Internal Medicine

## 2018-12-29 VITALS — BP 134/64 | HR 72 | Temp 98.9°F | Resp 18

## 2018-12-29 DIAGNOSIS — E039 Hypothyroidism, unspecified: Secondary | ICD-10-CM | POA: Insufficient documentation

## 2018-12-29 DIAGNOSIS — R748 Abnormal levels of other serum enzymes: Secondary | ICD-10-CM | POA: Insufficient documentation

## 2018-12-29 DIAGNOSIS — N939 Abnormal uterine and vaginal bleeding, unspecified: Secondary | ICD-10-CM | POA: Insufficient documentation

## 2018-12-29 DIAGNOSIS — D509 Iron deficiency anemia, unspecified: Secondary | ICD-10-CM | POA: Insufficient documentation

## 2018-12-29 DIAGNOSIS — M1990 Unspecified osteoarthritis, unspecified site: Secondary | ICD-10-CM | POA: Insufficient documentation

## 2018-12-29 DIAGNOSIS — I10 Essential (primary) hypertension: Secondary | ICD-10-CM | POA: Insufficient documentation

## 2018-12-29 DIAGNOSIS — D508 Other iron deficiency anemias: Secondary | ICD-10-CM

## 2018-12-29 MED ORDER — SODIUM CHLORIDE 0.9 % IV SOLN
Freq: Once | INTRAVENOUS | Status: AC
  Administered 2018-12-29: 14:00:00 via INTRAVENOUS
  Filled 2018-12-29: qty 250

## 2018-12-29 MED ORDER — SODIUM CHLORIDE 0.9 % IV SOLN
510.0000 mg | Freq: Once | INTRAVENOUS | Status: AC
  Administered 2018-12-29: 510 mg via INTRAVENOUS
  Filled 2018-12-29: qty 510

## 2018-12-29 NOTE — Patient Instructions (Signed)

## 2019-01-01 ENCOUNTER — Other Ambulatory Visit: Payer: BLUE CROSS/BLUE SHIELD

## 2019-01-02 ENCOUNTER — Other Ambulatory Visit: Payer: Self-pay

## 2019-01-02 ENCOUNTER — Ambulatory Visit (HOSPITAL_COMMUNITY): Payer: Self-pay | Attending: Cardiology

## 2019-01-02 DIAGNOSIS — R931 Abnormal findings on diagnostic imaging of heart and coronary circulation: Secondary | ICD-10-CM | POA: Insufficient documentation

## 2019-01-02 MED ORDER — PERFLUTREN LIPID MICROSPHERE
1.0000 mL | INTRAVENOUS | Status: AC | PRN
  Administered 2019-01-02: 3 mL via INTRAVENOUS
  Administered 2019-01-02: 1 mL via INTRAVENOUS

## 2019-01-03 ENCOUNTER — Other Ambulatory Visit: Payer: Self-pay | Admitting: Internal Medicine

## 2019-01-05 ENCOUNTER — Telehealth: Payer: Self-pay | Admitting: Internal Medicine

## 2019-01-05 ENCOUNTER — Encounter: Payer: Self-pay | Admitting: Internal Medicine

## 2019-01-05 NOTE — Telephone Encounter (Signed)
Patient has a lab appointment with is on 01/08/2019 and would like to know if those labs could be combined with the labs that she is having on 01/14/2019 at the [place] center in order to have fewer sticks  Please advise

## 2019-01-08 ENCOUNTER — Other Ambulatory Visit: Payer: Self-pay

## 2019-01-14 ENCOUNTER — Inpatient Hospital Stay (HOSPITAL_BASED_OUTPATIENT_CLINIC_OR_DEPARTMENT_OTHER): Payer: Self-pay | Admitting: Internal Medicine

## 2019-01-14 ENCOUNTER — Other Ambulatory Visit: Payer: Self-pay | Admitting: Internal Medicine

## 2019-01-14 ENCOUNTER — Telehealth: Payer: Self-pay | Admitting: Internal Medicine

## 2019-01-14 ENCOUNTER — Other Ambulatory Visit: Payer: Self-pay

## 2019-01-14 ENCOUNTER — Inpatient Hospital Stay: Payer: Self-pay

## 2019-01-14 VITALS — BP 150/80 | HR 95 | Temp 98.3°F | Resp 22 | Ht 65.0 in | Wt 366.6 lb

## 2019-01-14 DIAGNOSIS — M25559 Pain in unspecified hip: Secondary | ICD-10-CM

## 2019-01-14 DIAGNOSIS — R748 Abnormal levels of other serum enzymes: Secondary | ICD-10-CM

## 2019-01-14 DIAGNOSIS — D509 Iron deficiency anemia, unspecified: Secondary | ICD-10-CM

## 2019-01-14 DIAGNOSIS — E039 Hypothyroidism, unspecified: Secondary | ICD-10-CM

## 2019-01-14 DIAGNOSIS — I10 Essential (primary) hypertension: Secondary | ICD-10-CM

## 2019-01-14 DIAGNOSIS — D508 Other iron deficiency anemias: Secondary | ICD-10-CM

## 2019-01-14 DIAGNOSIS — N939 Abnormal uterine and vaginal bleeding, unspecified: Secondary | ICD-10-CM

## 2019-01-14 DIAGNOSIS — M1990 Unspecified osteoarthritis, unspecified site: Secondary | ICD-10-CM

## 2019-01-14 LAB — LACTATE DEHYDROGENASE: LDH: 177 U/L (ref 98–192)

## 2019-01-14 LAB — CBC WITH DIFFERENTIAL (CANCER CENTER ONLY)
Abs Immature Granulocytes: 0.08 10*3/uL — ABNORMAL HIGH (ref 0.00–0.07)
Basophils Absolute: 0.1 10*3/uL (ref 0.0–0.1)
Basophils Relative: 0 %
Eosinophils Absolute: 0.1 10*3/uL (ref 0.0–0.5)
Eosinophils Relative: 1 %
HCT: 36.3 % (ref 36.0–46.0)
Hemoglobin: 11.5 g/dL — ABNORMAL LOW (ref 12.0–15.0)
Immature Granulocytes: 1 %
Lymphocytes Relative: 39 %
Lymphs Abs: 5.1 10*3/uL — ABNORMAL HIGH (ref 0.7–4.0)
MCH: 26.8 pg (ref 26.0–34.0)
MCHC: 31.7 g/dL (ref 30.0–36.0)
MCV: 84.6 fL (ref 80.0–100.0)
Monocytes Absolute: 0.6 10*3/uL (ref 0.1–1.0)
Monocytes Relative: 5 %
Neutro Abs: 7 10*3/uL (ref 1.7–7.7)
Neutrophils Relative %: 54 %
Platelet Count: 295 10*3/uL (ref 150–400)
RBC: 4.29 MIL/uL (ref 3.87–5.11)
RDW: 16.4 % — ABNORMAL HIGH (ref 11.5–15.5)
WBC Count: 13 10*3/uL — ABNORMAL HIGH (ref 4.0–10.5)
nRBC: 0 % (ref 0.0–0.2)

## 2019-01-14 LAB — CMP (CANCER CENTER ONLY)
ALT: 19 U/L (ref 0–44)
AST: 14 U/L — ABNORMAL LOW (ref 15–41)
Albumin: 3.5 g/dL (ref 3.5–5.0)
Alkaline Phosphatase: 172 U/L — ABNORMAL HIGH (ref 38–126)
Anion gap: 11 (ref 5–15)
BUN: 14 mg/dL (ref 6–20)
CO2: 24 mmol/L (ref 22–32)
Calcium: 9.2 mg/dL (ref 8.9–10.3)
Chloride: 106 mmol/L (ref 98–111)
Creatinine: 0.82 mg/dL (ref 0.44–1.00)
GFR, Est AFR Am: >60 mL/min (ref >60)
GFR, Estimated: >60 mL/min (ref >60)
Glucose, Bld: 149 mg/dL — ABNORMAL HIGH (ref 70–99)
Potassium: 3.9 mmol/L (ref 3.5–5.1)
Sodium: 141 mmol/L (ref 135–145)
Total Bilirubin: 0.5 mg/dL (ref 0.3–1.2)
Total Protein: 7.1 g/dL (ref 6.5–8.1)

## 2019-01-14 LAB — VITAMIN B12: Vitamin B-12: 705 pg/mL (ref 180–914)

## 2019-01-14 LAB — SEDIMENTATION RATE: Sed Rate: 40 mm/hr — ABNORMAL HIGH (ref 0–22)

## 2019-01-14 LAB — IRON AND TIBC
Iron: 56 ug/dL (ref 41–142)
Saturation Ratios: 20 % — ABNORMAL LOW (ref 21–57)
TIBC: 275 ug/dL (ref 236–444)
UIBC: 219 ug/dL (ref 120–384)

## 2019-01-14 LAB — FERRITIN: Ferritin: 233 ng/mL (ref 11–307)

## 2019-01-14 LAB — FOLATE: Folate: 7.4 ng/mL (ref >5.9)

## 2019-01-14 NOTE — Telephone Encounter (Signed)
Will schedule once template is available °

## 2019-01-14 NOTE — Progress Notes (Signed)
Diagnosis Other iron deficiency anemia - Plan: CBC with Differential ([place] Only), CMP ([place] only), Lactate dehydrogenase (LDH), Ferritin  Staging Cancer Staging No matching staging information was found for the patient.  Assessment and Plan:   1.  Iron deficiency anemia ( IDA).   53 year old female referred for evaluation due to anemia.  Pt reports occasional bloody vaginal discharge.  She reports she is seeing GYN and is scheduled for biopsy.  She has not taken oral iron for a year due to lack of tolerance for medication.  She has arthritis and has brace on left LE.   Labs done 10/24/2018 reviewed and showed WBC 11.8 HB 10.6 MCV 78 plts 358,000.  Chemistries showed K+ 3.5 Cr 0.84 and normal LFTs.  Ferritin 17.  Pt reports problems with nausea.  Pt reportedly scheduled for GI evaluation for EGD and colonoscopy.   Pt had HB 10.6 with MCV of 78 and ferritin of 17 that is consistent with IDA.  Pt has intolerance to oral iron.  She was treated with IV iron on 12/12/2018 and 12/29/2018.    Labs done today 01/14/2019 reviewed and showed WBC 13 HB 11.5 Plts 295,000.  Ferritin 233.  Chemistries showed K+ 3.9 Cr 0.82 normal LFTs.  She has normal B12 levels.    Hb and iron levels are improved after IV iron.  Pt should follow-up with GI as recommended.  She will have follow-up and repeat labs in 04/2019.    2.  Uterine bleeding.  Pt should follow-up with GYN for evaluation.  She reports she is scheduled for endometrial biopsy.    3.  Nausea.  Follow-up with GI as recommended.    4.  Hypothyroidism.  TSH done 10/24/2018 was 8.72.  Pt should follow-up with PCP for management and monitoring as hypothyroidism may also contribute to anemia.    5.  Joint pain.  Pt has left knee brace.  She has been diagnosed with arthritis.  Sed rate elevated at 40.  Pt has elevated ALK Phos.  Normal LFTs.   Awaiting results of ANA, RF and SPEP.  Follow-up with PCP due to arthritis history.    6.  Hypertension.  BP  is 150/80.  Follow-up with PCP for monitoring.    7.  Health maintenance.  GI follow-up and mammogram screenings as recommended by PCP.    25  minutes spent with more than 50% spent in counseling and coordination of care.     Interval History:  Historical data obtained from note dated 12/03/2018.  53 year old female referred for evaluation due to anemia.  Pt reports occasional bloody vaginal discharge.  She reports she is seeing GYN and is scheduled for biopsy.  She has not taken oral iron for a year due to reported lack of tolerance for medication.  She has arthritis and has brace on left LE.   Labs done 10/24/2018 reviewed and showed WBC 11.8 HB 10.6 MCV 78 plts 358,000.  Chemistries showed K+ 3.5 Cr 0.84 and normal LFTs.  Ferritin 17.  Pt reports problems with nausea.  Pt reportedly scheduled for GI evaluation for EGD and colonoscopy.  Current Status:  Pt is seen today for follow-up.  She is here to go over labs .  She has been seen by GI.     Oncology History   No history exists.   Problem List Patient Active Problem List   Diagnosis Date Noted  . Patellofemoral arthritis of left knee [M17.12] 11/28/2018  .  Left knee pain [M25.562] 11/24/2018  . Lightheadedness [R42] 10/24/2018  . Metrorrhagia [N92.1] 10/24/2018  . Lumbar back pain with radiculopathy affecting left lower extremity [M54.16] 10/07/2018  . Right ovarian cyst [N83.201]   . OSA (obstructive sleep apnea), severe [G47.33] 06/23/2018  . Anxiety [F41.9] 03/05/2018  . Chronic nonintractable headache [R51] 03/05/2018  . Hyperlipidemia [E78.5] 01/23/2018  . Elevated liver enzymes [R74.8]   . Diabetes mellitus type 2, uncomplicated ([place]) [P53.7] 01/16/2018  . DKA (diabetic ketoacidoses) ([place]) [E11.10] 01/15/2018  . Gastroesophageal reflux disease [K21.9] 10/05/2017  . Temporal arteritis ([place]) [M31.6] 10/03/2017  . Right foot pain [M79.671] 07/15/2017  . Grief at loss of child [F43.21, Z63.4] 11/02/2016  . Hypertension [I10]  09/28/2015  . Morbid obesity ([place]) [E66.01] 05/17/2015  . Umbilical hernia [S82.7] 05/17/2015  . Chronic lower back pain [M54.5, G89.29] 05/17/2015  . Snoring [R06.83] 05/17/2015  . Palpitations [R00.2] 05/17/2015  . ANEMIA [D64.9] 09/03/2007  . Hypothyroidism following radioiodine therapy [E89.0] 09/02/2007  . INTERSTITIAL CYSTITIS [N30.10] 09/01/2007  . ALLERGIC RHINITIS [J30.9] 08/07/2007  . DISC DISEASE, LUMBAR [M51.37] 08/07/2007    Past Medical History Past Medical History:  Diagnosis Date  . Abnormal uterine bleeding   . Anemia   . Depression   . Diabetes mellitus without complication (HCC)    gestational  . Elevated blood-pressure reading without diagnosis of hypertension   . Endometriosis   . Fibroids   . GERD (gastroesophageal reflux disease)   . Graves disease   . Grief at loss of child   . H/O gestational diabetes mellitus, not currently pregnant 09/28/2015  . Hypertension   . Hypothyroidism   . Interstitial cystitis   . Menorrhagia   . Oropharyngeal candidiasis 01/16/2018  . OSA (obstructive sleep apnea), severe 06/23/2018  . Premature ventricular contractions    a. rare PVC by monitor 12/2016.  . Right ovarian cyst    3 cm.  . Thyroid disease   . Uterine leiomyoma     Past Surgical History Past Surgical History:  Procedure Laterality Date  . APPENDECTOMY    . ARTERY BIOPSY Left 10/25/2017   Procedure: BIOPSY TEMPORAL ARTERY;  Surgeon: Aviva Signs, MD;  Location: [facility];  Service: General;  Laterality: Left;  . CESAREAN SECTION    . CHOLECYSTECTOMY    . HERNIA REPAIR     incisional  . TUMOR REMOVAL  fibroids    Family History Family History  Problem Relation Age of Onset  . Hypertension Mother   . Cancer Mother   . Breast cancer Mother 60  . Hypertension Father   . Diabetes Father   . Cancer Father   . Ovarian cancer Maternal Aunt   . Thyroid disease Maternal Aunt        hypothyroid     Social History  reports that she has never smoked.  She has never used smokeless tobacco. She reports that she does not drink alcohol or use drugs.  Medications  Current Outpatient Medications:  .  cholecalciferol (VITAMIN D) 1000 units tablet, Take 1,000 Units by mouth daily., Disp: , Rfl:  .  Diclofenac Sodium (PENNSAID) 2 % SOLN, Place 2 g onto the skin 2 (two) times daily., Disp: 112 g, Rfl: 3 .  diltiazem (CARDIZEM LA) 120 MG 24 hr tablet, Take 1 tablet (120 mg total) by mouth daily., Disp: 90 tablet, Rfl: 1 .  famotidine (PEPCID) 20 MG tablet, Take 1 tablet (20 mg total) by mouth 2 (two) times daily., Disp: 60 tablet, Rfl: 3 .  Fe Fum-FePoly-Vit C-Vit B3 (INTEGRA) 62.5-62.5-40-3 MG CAPS, Take 1 capsule by mouth daily., Disp: 90 capsule, Rfl: 3 .  gabapentin (NEURONTIN) 100 MG capsule, TAKE 2 CAPSULES (200 MG TOTAL) BY MOUTH AT BEDTIME., Disp: 180 capsule, Rfl: 1 .  levonorgestrel (MIRENA, 52 MG,) 20 MCG/24HR IUD, 1 each by Intrauterine route once. , Disp: , Rfl:  .  losartan (COZAAR) 50 MG tablet, Take 1 tablet (50 mg total) by mouth daily., Disp: 90 tablet, Rfl: 3 .  methocarbamol (ROBAXIN) 500 MG tablet, TAKE 1 TABLET (500 MG TOTAL) BY MOUTH EVERY 6 (SIX) HOURS AS NEEDED FOR MUSCLE SPASMS., Disp: 90 tablet, Rfl: 0 .  Multiple Vitamin (MULTIVITAMIN) tablet, Take 1 tablet by mouth daily., Disp: , Rfl:  .  Olopatadine HCl (PATADAY) 0.2 % SOLN, Place 1 drop into both eyes daily as needed (itching). , Disp: , Rfl:  .  ondansetron (ZOFRAN ODT) 4 MG disintegrating tablet, Take 1 tablet (4 mg total) by mouth every 8 (eight) hours as needed for nausea or vomiting., Disp: 40 tablet, Rfl: 1 .  UNITHROID 100 MCG tablet, Take 1 tablet (100 mcg total) by mouth daily before breakfast., Disp: 90 tablet, Rfl: 2  Allergies Prednisone, Prozac [fluoxetine hcl], and Hydrocodone  Review of Systems Review of Systems - Oncology ROS negative   Physical Exam  Vitals Wt Readings from Last 3 Encounters:  01/14/19 (!) 366 lb 9.6 oz (166.3 kg)  12/17/18 (!)  361 lb (163.7 kg)  12/03/18 (!) 361 lb (163.7 kg)   Temp Readings from Last 3 Encounters:  01/14/19 98.3 F (36.8 C) (Oral)  12/29/18 98.9 F (37.2 C) (Oral)  12/12/18 98.3 F (36.8 C) (Oral)   BP Readings from Last 3 Encounters:  01/14/19 (!) 150/80  12/29/18 134/64  12/12/18 123/68   Pulse Readings from Last 3 Encounters:  01/14/19 95  12/29/18 72  12/12/18 74   Constitutional: Well-developed, well-nourished, and in no distress.   HENT: Head: Normocephalic and atraumatic.  Mouth/Throat: No oropharyngeal exudate. Mucosa moist. Eyes: Pupils are equal, round, and reactive to light. Conjunctivae are normal. No scleral icterus.  Neck: Normal range of motion. Neck supple. No JVD present.  Cardiovascular: Normal rate, regular rhythm and normal heart sounds.  Exam reveals no gallop and no friction rub.   No murmur heard. Pulmonary/Chest: Effort normal and breath sounds normal. No respiratory distress. No wheezes.No rales.  Abdominal: Obese.  Soft. Bowel sounds are normal. There is no tenderness. There is no guarding.  Musculoskeletal: No edema or tenderness.  Lymphadenopathy: No cervical, axillary or supraclavicular adenopathy.  Neurological: Alert and oriented to person, place, and time. No cranial nerve deficit.  Skin: Skin is warm and dry. No rash noted. No erythema. No pallor.  Psychiatric: Affect and judgment normal.   Labs Appointment on 01/14/2019  Component Date Value Ref Range Status  . WBC Count 01/14/2019 13.0* 4.0 - 10.5 K/uL Final  . RBC 01/14/2019 4.29  3.87 - 5.11 MIL/uL Final  . Hemoglobin 01/14/2019 11.5* 12.0 - 15.0 g/dL Final  . HCT 01/14/2019 36.3  36.0 - 46.0 % Final  . MCV 01/14/2019 84.6  80.0 - 100.0 fL Final  . MCH 01/14/2019 26.8  26.0 - 34.0 pg Final  . MCHC 01/14/2019 31.7  30.0 - 36.0 g/dL Final  . RDW 01/14/2019 16.4* 11.5 - 15.5 % Final  . Platelet Count 01/14/2019 295  150 - 400 K/uL Final  . nRBC 01/14/2019 0.0  0.0 - 0.2 % Final  .  Neutrophils  Relative % 01/14/2019 54  % Final  . Neutro Abs 01/14/2019 7.0  1.7 - 7.7 K/uL Final  . Lymphocytes Relative 01/14/2019 39  % Final  . Lymphs Abs 01/14/2019 5.1* 0.7 - 4.0 K/uL Final  . Monocytes Relative 01/14/2019 5  % Final  . Monocytes Absolute 01/14/2019 0.6  0.1 - 1.0 K/uL Final  . Eosinophils Relative 01/14/2019 1  % Final  . Eosinophils Absolute 01/14/2019 0.1  0.0 - 0.5 K/uL Final  . Basophils Relative 01/14/2019 0  % Final  . Basophils Absolute 01/14/2019 0.1  0.0 - 0.1 K/uL Final  . Immature Granulocytes 01/14/2019 1  % Final  . Abs Immature Granulocytes 01/14/2019 0.08* 0.00 - 0.07 K/uL Final   Performed at [facility], [place] [address], [place], [place] [address]  . Sodium 01/14/2019 141  135 - 145 mmol/L Final  . Potassium 01/14/2019 3.9  3.5 - 5.1 mmol/L Final  . Chloride 01/14/2019 106  98 - 111 mmol/L Final  . CO2 01/14/2019 24  22 - 32 mmol/L Final  . Glucose, Bld 01/14/2019 149* 70 - 99 mg/dL Final  . BUN 01/14/2019 14  6 - 20 mg/dL Final  . Creatinine 01/14/2019 0.82  0.44 - 1.00 mg/dL Final  . Calcium 01/14/2019 9.2  8.9 - 10.3 mg/dL Final  . Total Protein 01/14/2019 7.1  6.5 - 8.1 g/dL Final  . Albumin 01/14/2019 3.5  3.5 - 5.0 g/dL Final  . AST 01/14/2019 14* 15 - 41 U/L Final  . ALT 01/14/2019 19  0 - 44 U/L Final  . Alkaline Phosphatase 01/14/2019 172* 38 - 126 U/L Final  . Total Bilirubin 01/14/2019 0.5  0.3 - 1.2 mg/dL Final  . GFR, Est Non Af Am 01/14/2019 >60  >60 mL/min Final  . GFR, Est AFR Am 01/14/2019 >60  >60 mL/min Final  . Anion gap 01/14/2019 11  5 - 15 Final   Performed at [facility], [place] [address], [place], [place] [address]  . LDH 01/14/2019 177  98 - 192 U/L Final   Performed at [facility], [place] [address], [place], [place] [address]  . Ferritin 01/14/2019 233  11 - 307 ng/mL Final   Performed at [facility], [place] [address] [place], [place] [address]  . Iron 01/14/2019 56  41 - 142 ug/dL Final  . TIBC 01/14/2019 275  236 - 444 ug/dL Final  . Saturation Ratios 01/14/2019 20* 21 - 57 % Final  . UIBC 01/14/2019 219  120 - 384 ug/dL Final   Performed at [facility], [place] [address], [place], [place] [address]  . Vitamin B-12 01/14/2019 705  180 - 914 pg/mL Final   Comment: (NOTE) This assay is not validated for testing neonatal or myeloproliferative syndrome specimens for Vitamin B12 levels. Performed at [facility], [place] [address], [place], [place] [address]   . Folate 01/14/2019 7.4  >5.9 ng/mL Final   Performed at [place] [address], [place], [place] [address]  . Sed Rate 01/14/2019 40* 0 - 22 mm/hr Final   Performed at [facility], [place] [address], [place],  [address]     Pathology Orders Placed This Encounter  Procedures  . CBC with Differential ([facility] Only)    Standing Status:   Future    Standing Expiration Date:   01/14/2020  . CMP ([place] only)  Standing Status:   Future    Standing Expiration Date:   01/14/2020  . Lactate dehydrogenase (LDH)    Standing Status:   Future    Standing Expiration Date:   01/14/2020  . Ferritin    Standing Status:   Future    Standing Expiration Date:   01/14/2020       Zoila Shutter MD

## 2019-01-15 LAB — PROTEIN ELECTROPHORESIS, SERUM, WITH REFLEX
A/G Ratio: 1.1 (ref 0.7–1.7)
Albumin ELP: 3.4 g/dL (ref 2.9–4.4)
Alpha-1-Globulin: 0.2 g/dL (ref 0.0–0.4)
Alpha-2-Globulin: 0.7 g/dL (ref 0.4–1.0)
Beta Globulin: 1.1 g/dL (ref 0.7–1.3)
Gamma Globulin: 1 g/dL (ref 0.4–1.8)
Globulin, Total: 3 g/dL (ref 2.2–3.9)
Total Protein ELP: 6.4 g/dL (ref 6.0–8.5)

## 2019-01-15 LAB — RHEUMATOID FACTOR: Rheumatoid fact SerPl-aCnc: <10 IU/mL (ref 0.0–13.9)

## 2019-01-15 LAB — ANTINUCLEAR ANTIBODIES, IFA: ANA Ab, IFA: NEGATIVE

## 2019-01-16 LAB — HEMOGLOBINOPATHY EVALUATION
Hgb A2 Quant: 2.1 % (ref 1.8–3.2)
Hgb A: 97.9 % (ref 96.4–98.8)
Hgb C: 0 %
Hgb F Quant: 0 % (ref 0.0–2.0)
Hgb S Quant: 0 %
Hgb Variant: 0 %

## 2019-01-16 LAB — METHYLMALONIC ACID, SERUM: Methylmalonic Acid, Quantitative: 65 nmol/L (ref 0–378)

## 2019-01-19 ENCOUNTER — Other Ambulatory Visit: Payer: Self-pay | Admitting: Internal Medicine

## 2019-01-26 ENCOUNTER — Other Ambulatory Visit: Payer: Self-pay | Admitting: Internal Medicine

## 2019-02-03 ENCOUNTER — Ambulatory Visit (INDEPENDENT_AMBULATORY_CARE_PROVIDER_SITE_OTHER): Payer: Self-pay | Admitting: Internal Medicine

## 2019-02-03 ENCOUNTER — Other Ambulatory Visit (INDEPENDENT_AMBULATORY_CARE_PROVIDER_SITE_OTHER): Payer: Self-pay

## 2019-02-03 ENCOUNTER — Encounter: Payer: Self-pay | Admitting: Internal Medicine

## 2019-02-03 ENCOUNTER — Ambulatory Visit: Payer: Self-pay | Admitting: *Deleted

## 2019-02-03 DIAGNOSIS — D508 Other iron deficiency anemias: Secondary | ICD-10-CM

## 2019-02-03 DIAGNOSIS — R42 Dizziness and giddiness: Secondary | ICD-10-CM

## 2019-02-03 DIAGNOSIS — E119 Type 2 diabetes mellitus without complications: Secondary | ICD-10-CM

## 2019-02-03 DIAGNOSIS — R110 Nausea: Secondary | ICD-10-CM

## 2019-02-03 LAB — CBC WITH DIFFERENTIAL/PLATELET
Basophils Absolute: 0.1 10*3/uL (ref 0.0–0.1)
Basophils Relative: 0.9 % (ref 0.0–3.0)
Eosinophils Absolute: 0.1 10*3/uL (ref 0.0–0.7)
Eosinophils Relative: 0.5 % (ref 0.0–5.0)
HCT: 36.1 % (ref 36.0–46.0)
Hemoglobin: 11.8 g/dL — ABNORMAL LOW (ref 12.0–15.0)
Lymphocytes Relative: 37.7 % (ref 12.0–46.0)
Lymphs Abs: 4.2 10*3/uL — ABNORMAL HIGH (ref 0.7–4.0)
MCHC: 32.8 g/dL (ref 30.0–36.0)
MCV: 83.3 fl (ref 78.0–100.0)
Monocytes Absolute: 0.4 10*3/uL (ref 0.1–1.0)
Monocytes Relative: 3.4 % (ref 3.0–12.0)
Neutro Abs: 6.4 10*3/uL (ref 1.4–7.7)
Neutrophils Relative %: 57.5 % (ref 43.0–77.0)
Platelets: 323 10*3/uL (ref 150.0–400.0)
RBC: 4.33 Mil/uL (ref 3.87–5.11)
RDW: 17.5 % — ABNORMAL HIGH (ref 11.5–15.5)
WBC: 11.2 10*3/uL — ABNORMAL HIGH (ref 4.0–10.5)

## 2019-02-03 LAB — HEMOGLOBIN A1C: Hgb A1c MFr Bld: 7.2 % — ABNORMAL HIGH (ref 4.6–6.5)

## 2019-02-03 NOTE — Assessment & Plan Note (Signed)
Following with oncology Had iron infusion 6/19 and 7/6 Has follow-up with oncology 04/2019 to recheck blood work and see if additional infusions are necessary Has seen cardiology-needs to schedule EGD and colonoscopy and she will follow-up on that We will check CBC, iron panel given lightheadedness

## 2019-02-03 NOTE — Assessment & Plan Note (Signed)
We will check A1c Seems to be related to iron, but sugars could also be a potential cause

## 2019-02-03 NOTE — Assessment & Plan Note (Signed)
Continue Zofran as needed Her nausea has not gone away-?  GERD related Needs to contact GI to schedule EGD Nausea is worse with lightheadedness-?  Related to iron deficiency

## 2019-02-03 NOTE — Telephone Encounter (Signed)
Doxy appointment has been made for today 8/11.

## 2019-02-03 NOTE — Progress Notes (Signed)
Virtual Visit via Video Note  I connected with Erin [name] on 02/03/19 at  1:45 PM EDT by a video enabled telemedicine application and verified that I am speaking with the correct person using two identifiers.   I discussed the limitations of evaluation and management by telemedicine and the availability of in person appointments. The patient expressed understanding and agreed to proceed.  The patient is currently at home and I am in the office.    No referring provider.    History of Present Illness: This is an acute visit for lightheadedness.  After her iron infusion ( 6/19 and 7/6) she had no lightheadedness.  After that she was able to conclude that the lightheadedness was related to anemia or iron deficiency.  Yesterday the lightheadedness started again.  This morning she thought she was going to pass out.  She wonders if her iron is low again and that she needs another iron transfusion.  She continues to have intermittent nausea.  It is worse with the lightheadedness, but never went away.  She takes zofran as needed and that helps.  She did see GI and needs to schedule an EGD/colonoscopy.  She states she needs to follow-up with them to get this scheduled.  She had light vaginal bleeding on 7/28.  The last couple of days she has more vaginal discharge that was like tissue with blood.  She denies any blood in her stool, black stool or blood in the urine.  There have been no medication changes.  Review of Systems  Constitutional: Negative for chills and fever.  Respiratory: Negative for cough, shortness of breath and wheezing.   Cardiovascular: Negative for chest pain, palpitations and leg swelling.  Gastrointestinal: Positive for diarrhea (x 2 days). Negative for blood in stool and melena.  Genitourinary:       Vaginal bleeding  Neurological: Negative for dizziness and headaches.       Lightheadedness         Social History   Socioeconomic History  . Marital status:  Married    Spouse name: Not on file  . Number of children: Not on file  . Years of education: Not on file  . Highest education level: Not on file  Occupational History  . Not on file  Social Needs  . Financial resource strain: Not on file  . Food insecurity    Worry: Not on file    Inability: Not on file  . Transportation needs    Medical: Not on file    Non-medical: Not on file  Tobacco Use  . Smoking status: Never Smoker  . Smokeless tobacco: Never Used  Substance and Sexual Activity  . Alcohol use: No  . Drug use: No  . Sexual activity: Yes    Birth control/protection: I.U.D.    Comment: Mirena inserted 2015/2016  Lifestyle  . Physical activity    Days per week: Not on file    Minutes per session: Not on file  . Stress: Not on file  Relationships  . Social Herbalist on phone: Not on file    Gets together: Not on file    Attends religious service: Not on file    Active member of club or organization: Not on file    Attends meetings of clubs or organizations: Not on file    Relationship status: Not on file  Other Topics Concern  . Not on file  Social History Narrative  . Not on file  Observations/Objective: Appears well in NAD Breathing normally.  Assessment and Plan:  See Problem List for Assessment and Plan of chronic medical problems.   Follow Up Instructions:    I discussed the assessment and treatment plan with the patient. The patient was provided an opportunity to ask questions and all were answered. The patient agreed with the plan and demonstrated an understanding of the instructions.   The patient was advised to call back or seek an in-person evaluation if the symptoms worsen or if the condition fails to improve as anticipated.    Binnie Rail, MD

## 2019-02-03 NOTE — Assessment & Plan Note (Signed)
Lightheadedness resolved after she had the iron infusions and she feels it may be related to iron deficiency/anemia Lightheadedness started again yesterday She has had some vaginal bleeding, but nothing significant Will check CBC, iron panel

## 2019-02-03 NOTE — Telephone Encounter (Signed)
Pt called with feeling faint and lightheaded for a few months now. She has had iron transfusions in the past, last one on July 6th. She is able to ambulated without difficulty. She also has nausea and has had a couple of days of diarrhea for the last 2 days. None today.  She has taken Zofran for the nausea with relief. Advised to stay hydrated and to go to the hospital for shortness of breath or passing out. She voiced understanding. Appointment requested per protocol. She would like to discuss with her provider her next options. LB at [facility] notified. Call transferred to the practice. Triage routed to the practice for review.  Reason for Disposition . [1] MILD dizziness (e.g., walking normally) AND [2] has been evaluated by physician for this  Answer Assessment - Initial Assessment Questions 1. DESCRIPTION: "Describe your dizziness."     Feeling faint and nausea 2. LIGHTHEADED: "Do you feel lightheaded?" (e.g., somewhat faint, woozy, weak upon standing)     lightheaded 3. VERTIGO: "Do you feel like either you or the room is spinning or tilting?" (i.e. vertigo)     no 4. SEVERITY: "How bad is it?"  "Do you feel like you are going to faint?" "Can you stand and walk?"   - MILD - walking normally   - MODERATE - interferes with normal activities (e.g., work, school)    - SEVERE - unable to stand, requires support to walk, feels like passing out now.      mild 5. ONSET:  "When did the dizziness begin?"     Started since January  6. AGGRAVATING FACTORS: "Does anything make it worse?" (e.g., standing, change in head position)     Sitting up 7. HEART RATE: "Can you tell me your heart rate?" "How many beats in 15 seconds?"  (Note: not all patients can do this)       no 8. CAUSE: "What do you think is causing the dizziness?"     Thinks it is low iron 9. RECURRENT SYMPTOM: "Have you had dizziness before?" If so, ask: "When was the last time?" "What happened that time?"     Yes and have to  have iron transfusions 10. OTHER SYMPTOMS: "Do you have any other symptoms?" (e.g., fever, chest pain, vomiting, diarrhea, bleeding)       Nausea, had some diarrhea for the last 2 days 11. PREGNANCY: "Is there any chance you are pregnant?" "When was your last menstrual period?"       n/a  Protocols used: DIZZINESS [facility]

## 2019-02-04 LAB — IRON,TIBC AND FERRITIN PANEL
%SAT: 27 % (calc) (ref 16–45)
Ferritin: 165 ng/mL (ref 16–232)
Iron: 76 ug/dL (ref 45–160)
TIBC: 280 mcg/dL (calc) (ref 250–450)

## 2019-02-05 ENCOUNTER — Encounter: Payer: Self-pay | Admitting: Internal Medicine

## 2019-02-10 ENCOUNTER — Telehealth: Payer: Self-pay | Admitting: Internal Medicine

## 2019-02-10 ENCOUNTER — Encounter: Payer: Self-pay | Admitting: Internal Medicine

## 2019-02-10 ENCOUNTER — Other Ambulatory Visit: Payer: Self-pay | Admitting: Emergency Medicine

## 2019-02-10 MED ORDER — CARDIZEM LA 120 MG PO TB24: 120.0000 mg | ORAL_TABLET | Freq: Every day | ORAL | 1 refills | Status: DC

## 2019-02-10 NOTE — Telephone Encounter (Signed)
Medication Refill - Medication:  diltiazem ER 120 MG 24 hr tablet  Has the patient contacted their pharmacy? Yes advised to call PCP. Patient stated she want's it sent in with this name only attached due to it being cheaper.   Preferred Pharmacy (with phone number or street name):  [id number] - [place], [place] [phone] (Phone) [phone] (Fax)   Agent: Please be advised that RX refills may take up to 3 business days. We ask that you follow-up with your pharmacy.

## 2019-02-10 NOTE — Telephone Encounter (Signed)
Rx sent 

## 2019-02-11 MED ORDER — GABAPENTIN 100 MG PO CAPS: ORAL_CAPSULE | ORAL | 1 refills | Status: DC

## 2019-02-11 MED ORDER — DILTIAZEM HCL ER 120 MG PO CP24: 120.0000 mg | ORAL_CAPSULE | Freq: Every day | ORAL | 3 refills | Status: DC

## 2019-02-11 NOTE — Telephone Encounter (Signed)
Prescription sent

## 2019-02-11 NOTE — Addendum Note (Signed)
Addended by: Binnie Rail on: 02/11/2019 04:36 PM   Modules accepted: Orders

## 2019-02-11 NOTE — Telephone Encounter (Signed)
Patient is calling again to request the correct refill for her Dilitiazem ER 120 MG.  She stated that they sent the wrong medication again.  Patient is down to her last pill.  Please advise and send as soon as possible.  CB# [phone]

## 2019-03-03 NOTE — Progress Notes (Signed)
Virtual Visit via Video Note  I connected with Erin [name] on 03/04/19 at  2:00 PM EDT by a video enabled telemedicine application and verified that I am speaking with the correct person using two identifiers.   I discussed the limitations of evaluation and management by telemedicine and the availability of in person appointments. The patient expressed understanding and agreed to proceed.  The patient is currently at home and I am in the office.    No referring provider.    History of Present Illness: She is here for follow up of her chronic medical conditions.   She is exercising irregularly.    Hypertension: She is taking her medication daily. She is compliant with a low sodium diet.  She denies chest pain, palpitations, edema, shortness of breath.  Diabetes: She is taking her medication daily as prescribed. She is compliant with a diabetic diet.  She monitors her sugars and they have been running 107, 110.  Hypothyroidism:  She is following with Dr Cruzita Lederer.  She is taking her medication daily.  She denies any recent changes in energy or weight that are unexplained.   Hyperlipidemia: She is not on any medication daily. She is compliant with a low fat/cholesterol diet.   Iron def anemia: related to metorrhagia. Has had iron infusions. She still has light bleeding.  She follows with Gyn.    chronic lower back pain:  She takes robaxin as needed.  Currently her back pain is controlled.  Chronic knee pain:  She has seen Dr Tamala Julian.  She is trying some natural remedies.  She is limited in how much she is able to walk.  She will follow-up with Dr. Tamala Julian have her knee pain does not improve.    Review of Systems  Constitutional: Positive for chills (? menopausal). Negative for fever.  Respiratory: Negative for cough, shortness of breath and wheezing.   Cardiovascular: Negative for chest pain, palpitations and leg swelling.  Gastrointestinal: Positive for nausea.  Musculoskeletal:  Positive for back pain and joint pain.  Neurological: Positive for headaches. Negative for dizziness.     Social History   Socioeconomic History  . Marital status: Married    Spouse name: Not on file  . Number of children: Not on file  . Years of education: Not on file  . Highest education level: Not on file  Occupational History  . Not on file  Social Needs  . Financial resource strain: Not on file  . Food insecurity    Worry: Not on file    Inability: Not on file  . Transportation needs    Medical: Not on file    Non-medical: Not on file  Tobacco Use  . Smoking status: Never Smoker  . Smokeless tobacco: Never Used  Substance and Sexual Activity  . Alcohol use: No  . Drug use: No  . Sexual activity: Yes    Birth control/protection: I.U.D.    Comment: Mirena inserted 2015/2016  Lifestyle  . Physical activity    Days per week: Not on file    Minutes per session: Not on file  . Stress: Not on file  Relationships  . Social Herbalist on phone: Not on file    Gets together: Not on file    Attends religious service: Not on file    Active member of club or organization: Not on file    Attends meetings of clubs or organizations: Not on file    Relationship status: Not on  file  Other Topics Concern  . Not on file  Social History Narrative  . Not on file     Observations/Objective: Appears well in NAD   Assessment and Plan:  See Problem List for Assessment and Plan of chronic medical problems.   Follow Up Instructions:    I discussed the assessment and treatment plan with the patient. The patient was provided an opportunity to ask questions and all were answered. The patient agreed with the plan and demonstrated an understanding of the instructions.   The patient was advised to call back or seek an in-person evaluation if the symptoms worsen or if the condition fails to improve as anticipated.  Follow-up in 6 months, sooner if needed  Binnie Rail, MD

## 2019-03-04 ENCOUNTER — Encounter: Payer: Self-pay | Admitting: Internal Medicine

## 2019-03-04 ENCOUNTER — Ambulatory Visit (INDEPENDENT_AMBULATORY_CARE_PROVIDER_SITE_OTHER): Payer: Self-pay | Admitting: Internal Medicine

## 2019-03-04 DIAGNOSIS — E782 Mixed hyperlipidemia: Secondary | ICD-10-CM

## 2019-03-04 DIAGNOSIS — M5416 Radiculopathy, lumbar region: Secondary | ICD-10-CM

## 2019-03-04 DIAGNOSIS — G4733 Obstructive sleep apnea (adult) (pediatric): Secondary | ICD-10-CM

## 2019-03-04 DIAGNOSIS — I10 Essential (primary) hypertension: Secondary | ICD-10-CM

## 2019-03-04 DIAGNOSIS — E890 Postprocedural hypothyroidism: Secondary | ICD-10-CM

## 2019-03-04 DIAGNOSIS — E119 Type 2 diabetes mellitus without complications: Secondary | ICD-10-CM

## 2019-03-04 NOTE — Assessment & Plan Note (Signed)
Following with Dr. Cruzita Lederer

## 2019-03-04 NOTE — Assessment & Plan Note (Signed)
She understands the importance of weight loss She is trying to do some exercise and will try to increase it-she is limited by her knee pain Working on dietary changes Follow-up in 6 months

## 2019-03-04 NOTE — Assessment & Plan Note (Signed)
Using CPAP nightly 

## 2019-03-04 NOTE — Assessment & Plan Note (Signed)
Pain currently controlled Robaxin as needed

## 2019-03-04 NOTE — Assessment & Plan Note (Signed)
Not currently on medication Need to recheck lipid panel-we will do at her next visit and discuss starting a cholesterol medication since she is a diabetic Currently working on weight loss, improving her diet and increasing her exercise

## 2019-03-04 NOTE — Assessment & Plan Note (Signed)
She has improved her diet and is exercising She is limited with her exercise by her knee pain, but will try to increase Stressed the importance of weight loss Continue lifestyle changes Follow-up in 6 months with A1c at that time

## 2019-03-04 NOTE — Assessment & Plan Note (Signed)
BP Readings from Last 3 Encounters:  01/14/19 (!) 150/80  12/29/18 134/64  12/12/18 123/68   Overall blood pressure typically controlled We will continue current medication at current dose She is working on weight loss, which will help Continue to work on increasing exercise, diet and weight loss

## 2019-03-30 IMAGING — CR DG CHEST 2V
2 series · 2 of 2 positions shown · non-contrast
Comparison: Chest x-ray dated 08/17/2016.

CLINICAL DATA: Weakness, dyspnea, fatigue for 2 weeks.

EXAM:
CHEST - 2 VIEW

[w chest lat]
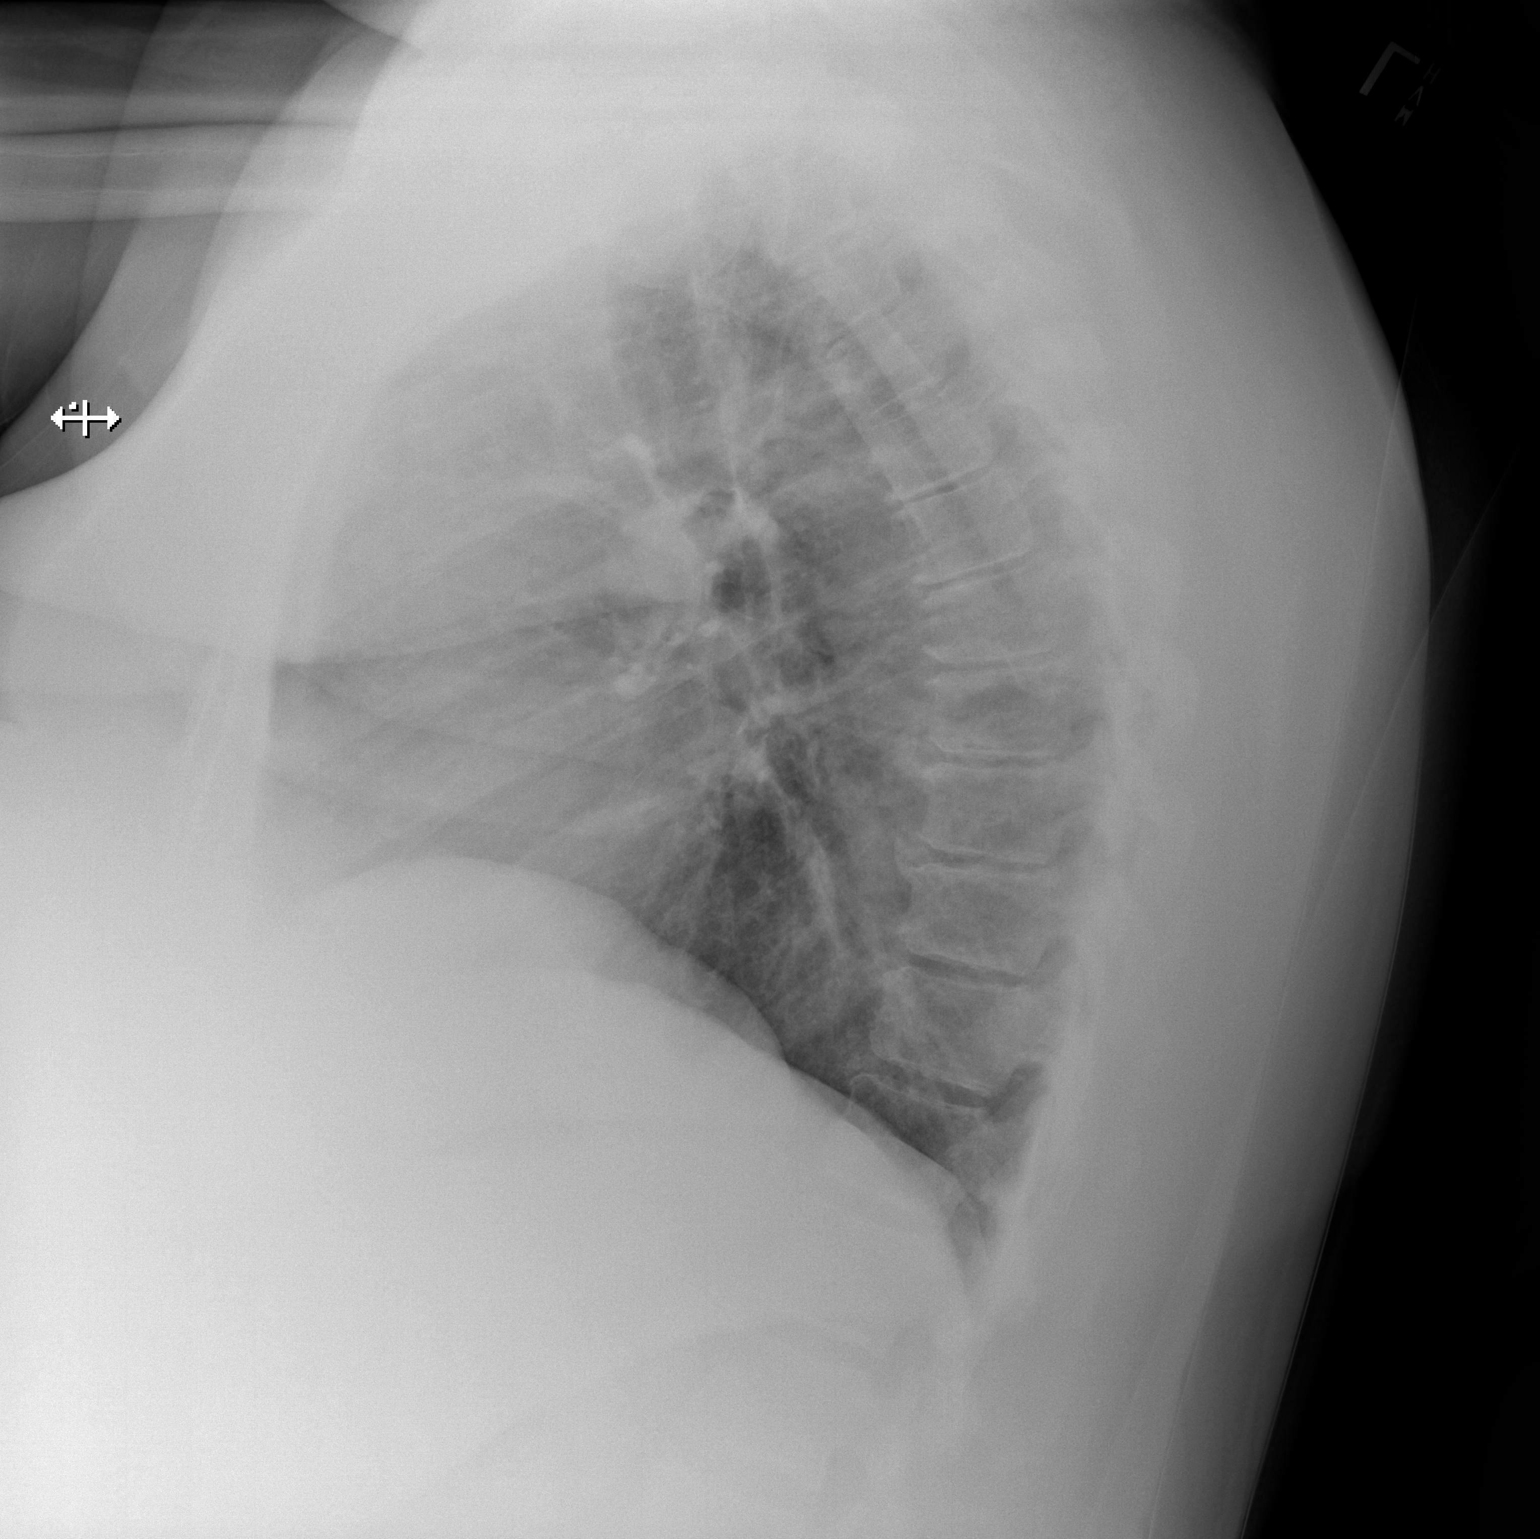

[x chest ap]
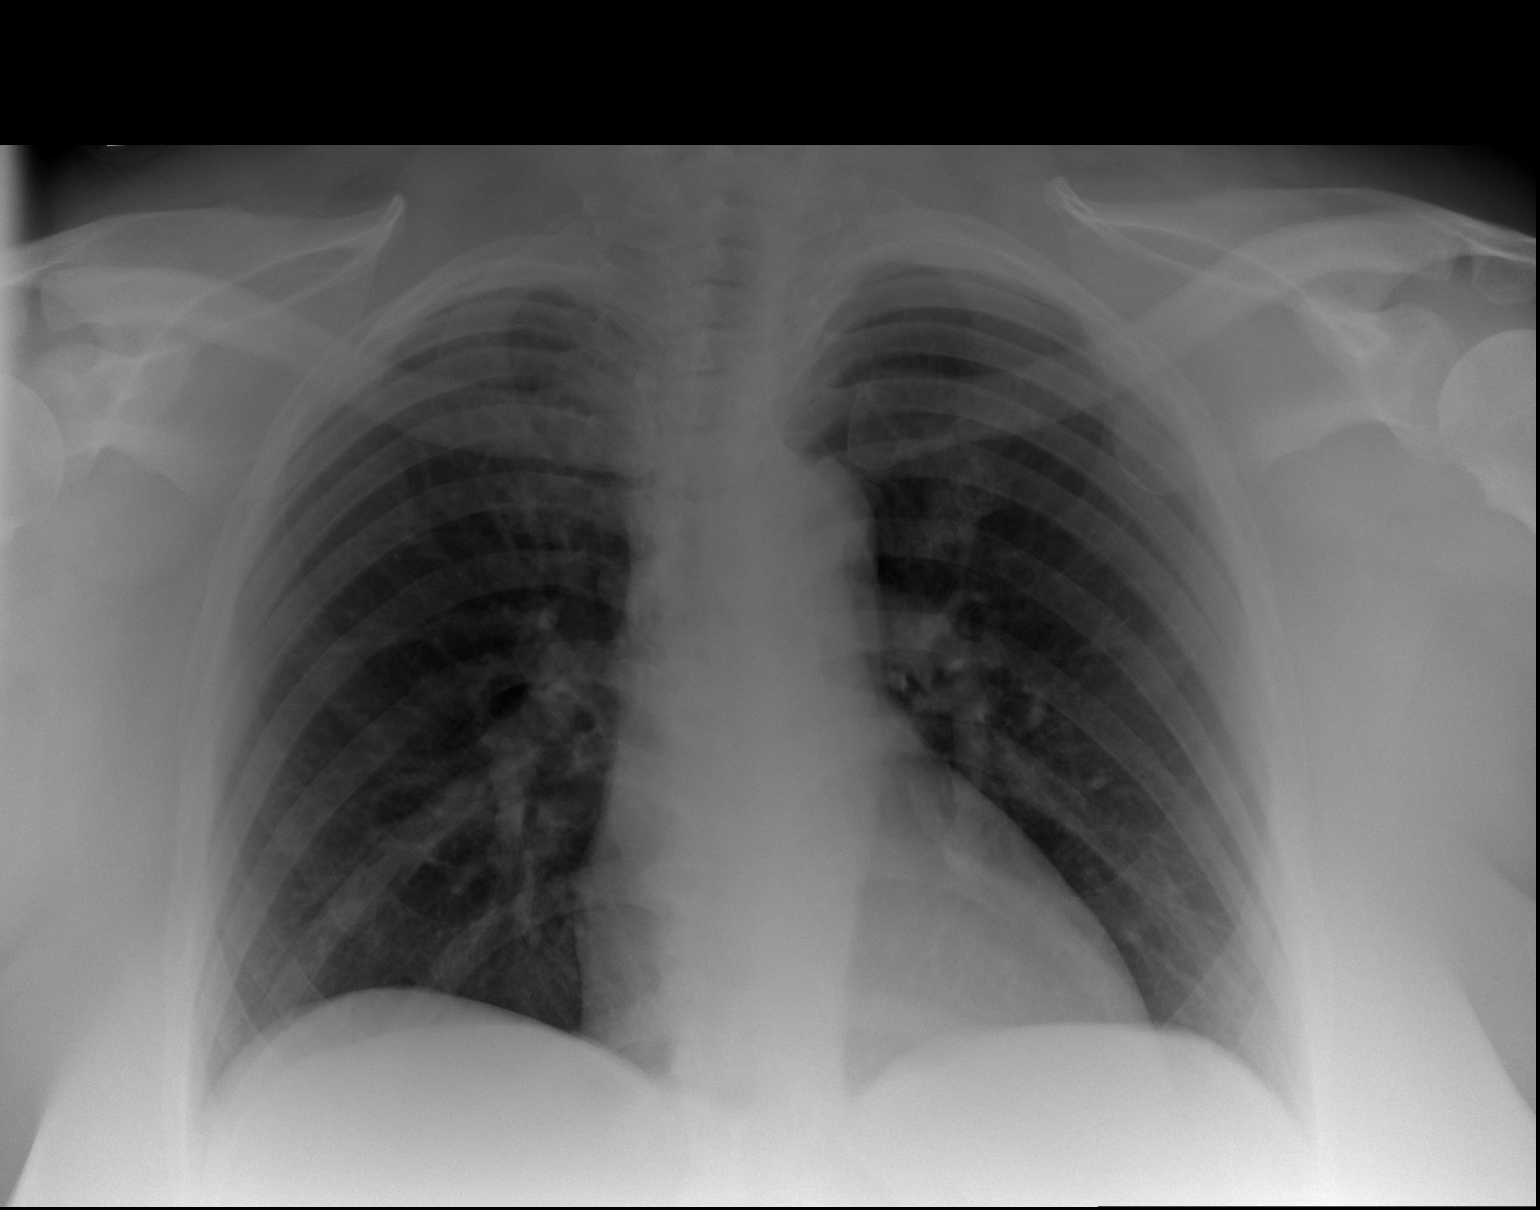

[2 of 2 positions shown; findings below may reference images not displayed]

FINDINGS: The heart size and mediastinal contours are within normal limits.
Both lungs are clear. The visualized skeletal structures are
unremarkable.
IMPRESSION: No active cardiopulmonary disease. No evidence of pneumonia or
pulmonary edema.

## 2019-03-30 IMAGING — US US ABDOMEN LIMITED
1 series · 14 of 25 positions shown · non-contrast
Comparison: None.

CLINICAL DATA: Transaminitis

EXAM:
ULTRASOUND ABDOMEN LIMITED RIGHT UPPER QUADRANT

[Series 1: us abdomen limited · 14 of 28 slices shown]
[im 1/28]
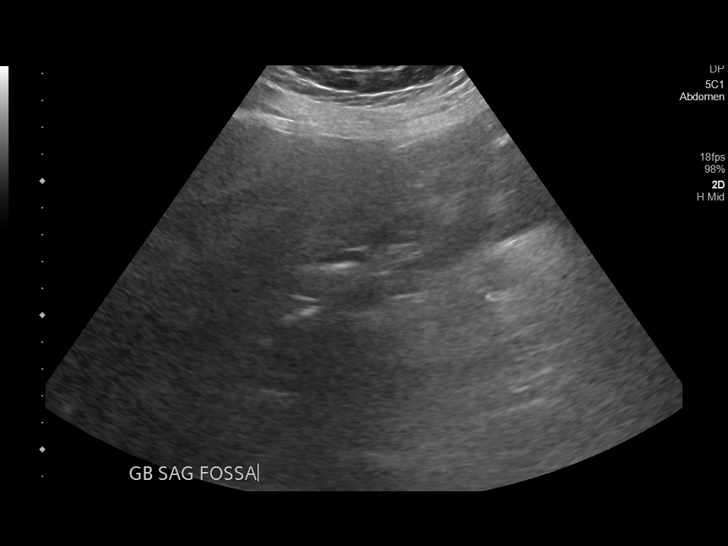
[im 3/28]
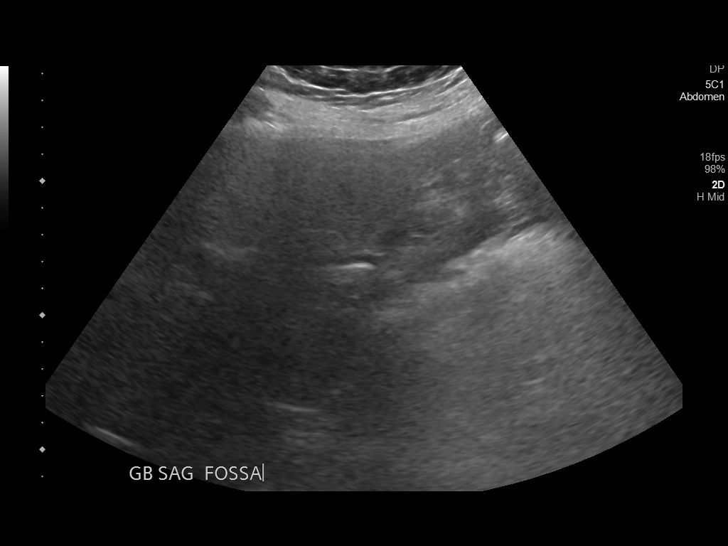
[im 5/28]
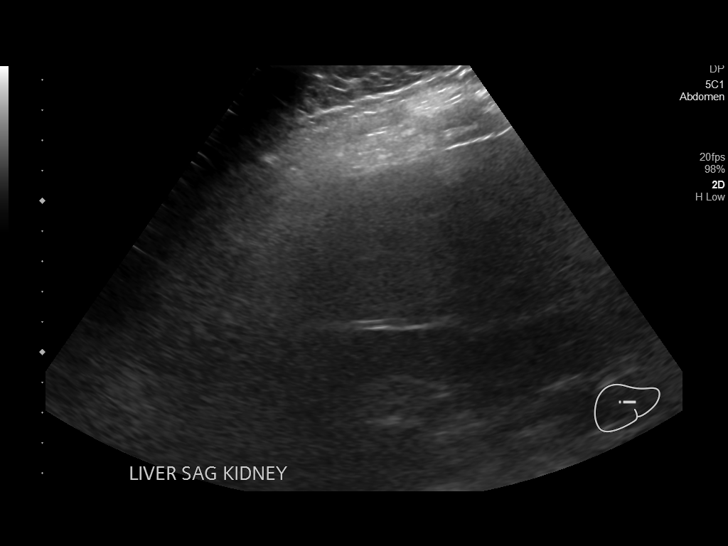
[im 7/28]
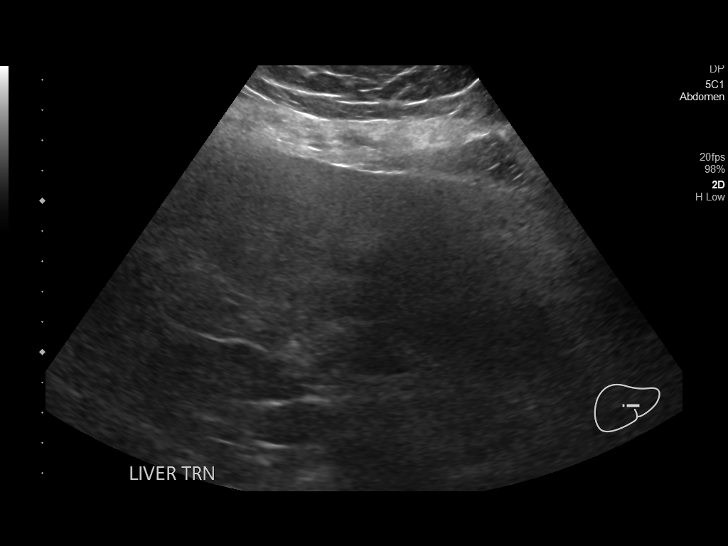
[im 10/28]
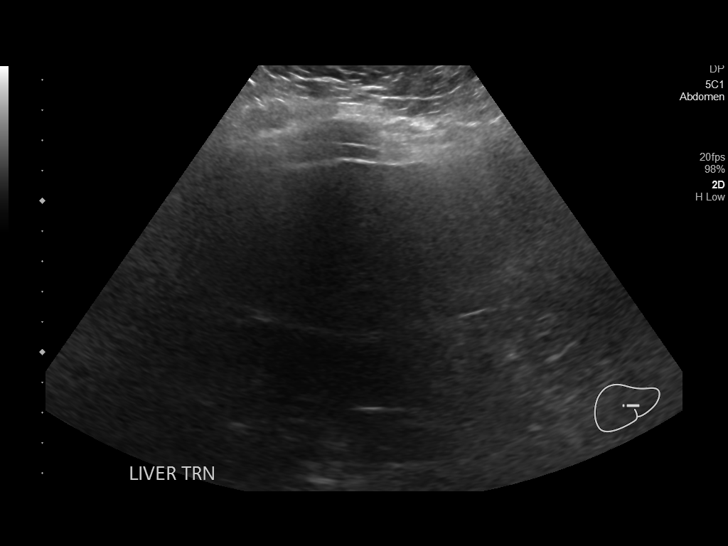
[im 11/28]
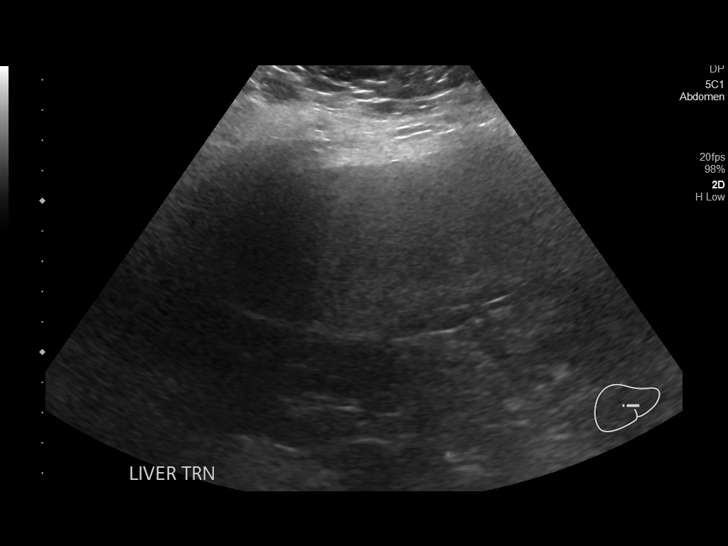
[im 13/28]
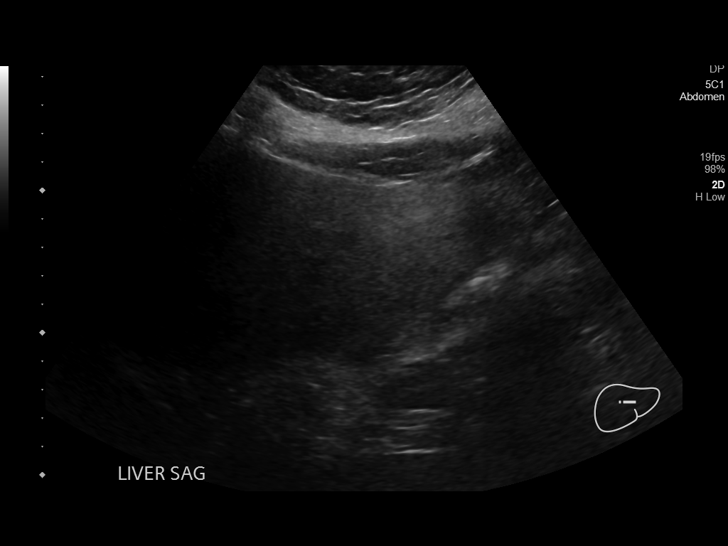
[im 15/28]
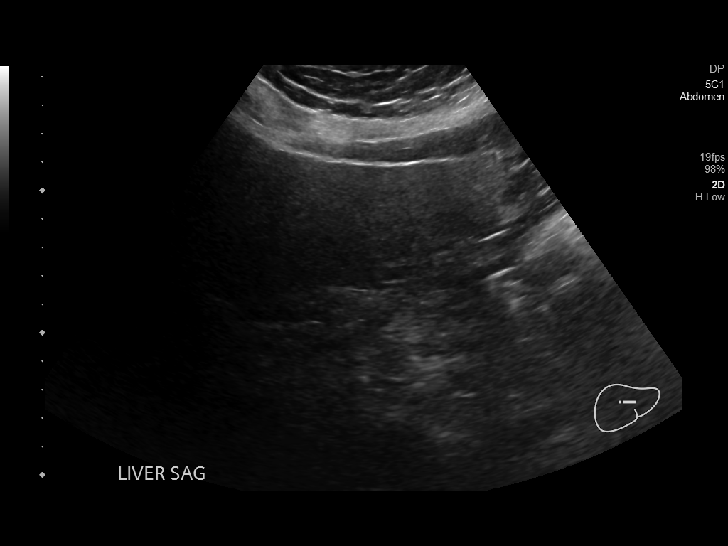
[im 17/28]
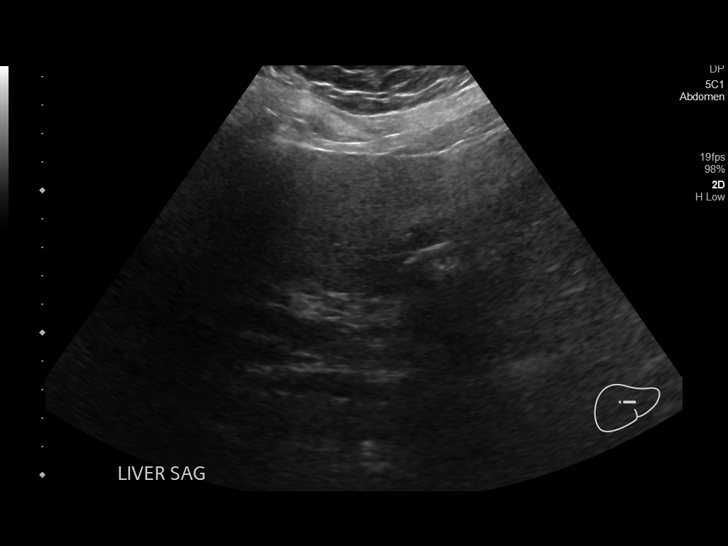
[im 19/28]
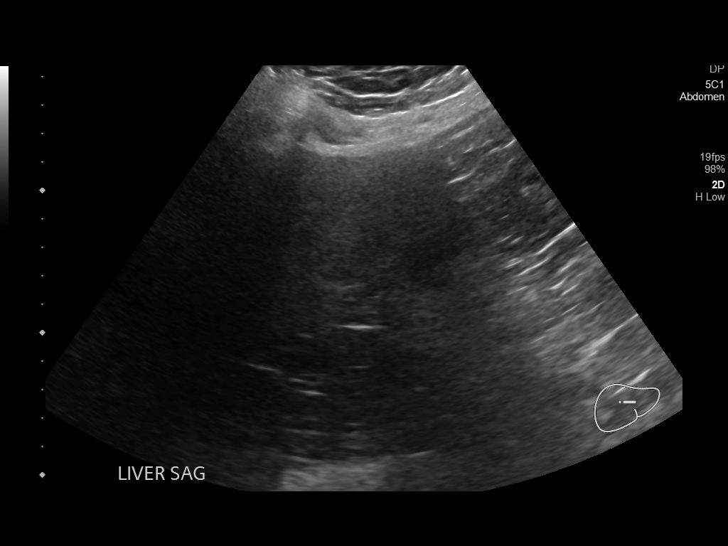
[im 21/28]
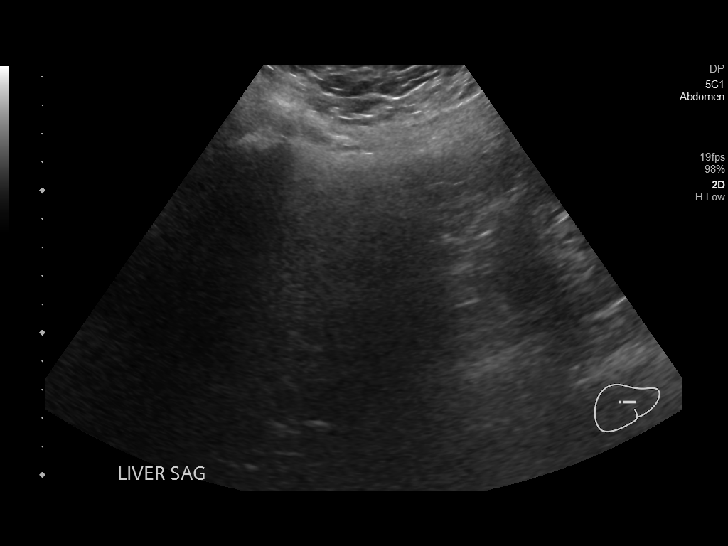
[im 23/28]
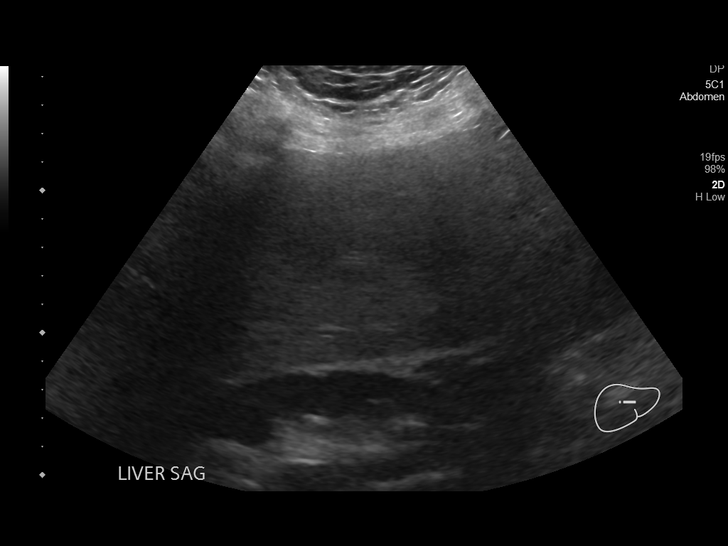
[im 25/28]
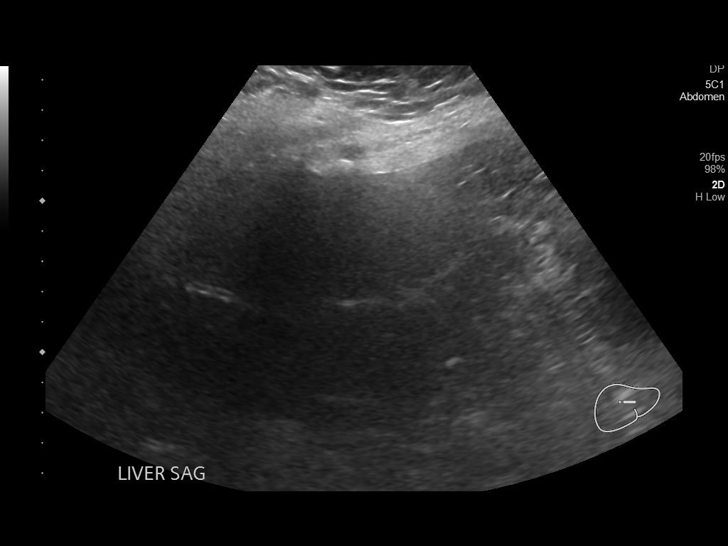
[im 28/28]
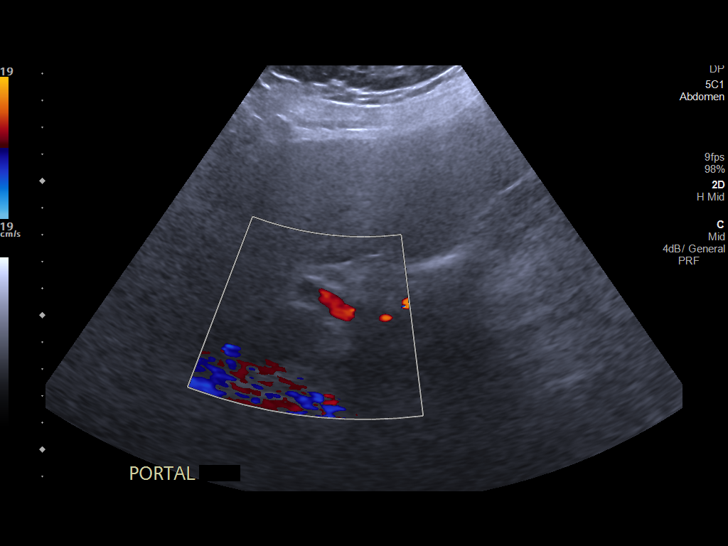

[14 of 25 positions shown; findings below may reference images not displayed]

FINDINGS: Gallbladder:

Gallbladder is surgically absent. The gallbladder fossa was imaged
and is unremarkable.

Common bile duct:

Diameter: 1.8 mm

Liver:

Increased hepatic echogenicity consistent with steatosis. Portal
vein is patent on color Doppler imaging with normal direction of
blood flow towards the liver.
IMPRESSION: 1. Status post cholecystectomy.
2. Hepatic steatosis.

## 2019-04-01 ENCOUNTER — Encounter: Payer: Self-pay | Admitting: Internal Medicine

## 2019-04-12 ENCOUNTER — Other Ambulatory Visit: Payer: Self-pay | Admitting: Internal Medicine

## 2019-04-23 ENCOUNTER — Other Ambulatory Visit: Payer: Self-pay | Admitting: Internal Medicine

## 2019-04-27 ENCOUNTER — Other Ambulatory Visit: Payer: Self-pay | Admitting: Internal Medicine

## 2019-04-30 ENCOUNTER — Ambulatory Visit (INDEPENDENT_AMBULATORY_CARE_PROVIDER_SITE_OTHER): Payer: BLUE CROSS/BLUE SHIELD | Admitting: Internal Medicine

## 2019-04-30 ENCOUNTER — Encounter: Payer: Self-pay | Admitting: Internal Medicine

## 2019-04-30 DIAGNOSIS — E890 Postprocedural hypothyroidism: Secondary | ICD-10-CM

## 2019-04-30 NOTE — Progress Notes (Signed)
Patient ID: Erin [name], female   DOB: 1965/10/16, 53 y.o.   MRN: [id number]   Patient location: Home My location: Office  Referring Provider: Binnie Rail, MD  I connected with the patient on 04/30/19 at  3:53 PM EST by a video enabled telemedicine application and verified that I am speaking with the correct person.   I discussed the limitations of evaluation and management by telemedicine and the availability of in person appointments. The patient expressed understanding and agreed to proceed.   Details of the encounter are shown below.  HPI  Erin [name] is a 53 y.o.-year-old female, initially referred by her PCP, Dr. Quay Burow, presenting for follow-up for postablative hypothyroidism.  She previously saw Dr. Dwyane Dee.  Last visit with me 6 months ago.  At last visit, she was complaining about dizziness, palpitations, nausea night sweats, chills, and her TFTs were slightly elevated prior to our visit.  She was wondering whether these were related to her levothyroxine dose.  We decreased the dose at the time of the last visit.  Her symptoms resolved since then.  Reviewed history: Pt. has been dx with hypothyroidism after RAI treatment for Graves' disease in 08/2008 >> on Unithroid d.a.w.. She had a lot of variability in the TFTs on generic LT4.  At last visit, she was not taking the medication correctly and I advised her how to take it correctly.  Since he made significant changes in how she was taking the medication, I also decreased her dose of levothyroxine from 125 to 100 mcg daily.  She is on Unithroid 100 mcg daily: - in am - fasting - at least 30 min from b'fast - No Ca - + MVI >4h after LT4 - No PPIs, + H2 blockers at bedtime - + Biotin  Reviewed patient's TFTs: Lab Results  Component Value Date   TSH 8.72 (H) 10/24/2018   TSH 1.34 05/16/2018   TSH 0.43 03/13/2018   TSH 1.06 01/29/2018   TSH 0.288 (L) 01/15/2018   TSH 0.14 (L) 11/27/2017   TSH 0.09 (L) 08/27/2017    TSH 18.55 (H) 06/27/2017   TSH 0.16 (L) 10/29/2016   TSH 10.45 (H) 02/14/2016   FREET4 0.83 05/16/2018   FREET4 1.14 11/27/2017   FREET4 1.11 08/27/2017   FREET4 0.67 06/27/2017   FREET4 1.17 10/29/2016   FREET4 0.72 02/14/2016   FREET4 1.07 07/25/2015   FREET4 0.81 11/17/2014   FREET4 0.75 08/03/2014   FREET4 0.68 12/01/2013   T3FREE 6.7 (H) 09/04/2007   Antithyroid antibodies: No results found for: THGAB No components found for: TPOAB  Pt denies: - feeling nodules in neck - hoarseness - dysphagia - choking - SOB with lying down  She has + FH of thyroid disorders in: mother, M aunts. No FH of thyroid cancer. No h/o radiation tx to head or neck.  No seaweed or kelp. No recent contrast studies. No herbal supplements.   She has a history of uncontrolled diabetes but latest HbA1c levels were better.  She has a history of DKA in 12/2017.  This was  managed by PCP, but patient with wanting to start managing this, also.  Reviewed previous HbA1c levels: Lab Results  Component Value Date   HGBA1C 7.2 (H) 02/03/2019   HGBA1C 6.9 (H) 10/24/2018   HGBA1C 6.2 (A) 08/29/2018   HGBA1C 10.3 (H) 03/13/2018   HGBA1C 12.7 (H) 01/15/2018   HGBA1C 8.9 (H) 12/13/2017   HGBA1C 6.3 06/11/2017   HGBA1C 6.1 09/28/2015  Her diabetes is diet controlled.  She checks her sugars every week, but not in the last 2 weeks - she feels that they improve after the last HbA1c: - am: 130s - 2h after b'fast: n/c - lunch: n/c - 2h after lunch: n/c - dinner: n/c - 2h after dinner: n/c - bedtime: n/c  She was on high doses on Prednisone (60 mg) >> at last visit 2.5 mg daily.  She is now off prednisone.  ROS: Constitutional: no weight gain/no weight loss, no fatigue, no subjective hyperthermia, no subjective hypothermia Eyes: no blurry vision, no xerophthalmia ENT: no sore throat, + see HPI Cardiovascular: no CP/no SOB/no palpitations/no leg swelling Respiratory: no cough/no SOB/no  wheezing Gastrointestinal: no N/no V/no D/no C/no acid reflux Musculoskeletal: no muscle aches/no joint aches Skin: no rashes, no hair loss Neurological: no tremors/no numbness/no tingling/no dizziness  I reviewed pt's medications, allergies, PMH, social hx, family hx, and changes were documented in the history of present illness. Otherwise, unchanged from my initial visit note.  Past Medical History:  Diagnosis Date  . Abnormal uterine bleeding   . Anemia   . Depression   . Diabetes mellitus without complication (HCC)    gestational  . Elevated blood-pressure reading without diagnosis of hypertension   . Endometriosis   . Fibroids   . GERD (gastroesophageal reflux disease)   . Graves disease   . Grief at loss of child   . H/O gestational diabetes mellitus, not currently pregnant 09/28/2015  . Hypertension   . Hypothyroidism   . Interstitial cystitis   . Menorrhagia   . Oropharyngeal candidiasis 01/16/2018  . OSA (obstructive sleep apnea), severe 06/23/2018  . Premature ventricular contractions    a. rare PVC by monitor 12/2016.  . Right ovarian cyst    3 cm.  . Thyroid disease   . Uterine leiomyoma    Past Surgical History:  Procedure Laterality Date  . APPENDECTOMY    . ARTERY BIOPSY Left 10/25/2017   Procedure: BIOPSY TEMPORAL ARTERY;  Surgeon: Aviva Signs, MD;  Location: [facility];  Service: General;  Laterality: Left;  . CESAREAN SECTION    . CHOLECYSTECTOMY    . HERNIA REPAIR     incisional  . TUMOR REMOVAL  fibroids   Social History   Socioeconomic History  . Marital status: Married    Spouse name: Not on file  . Number of children: Not on file  . Years of education: Not on file  . Highest education level: Not on file  Occupational History  . Not on file  Social Needs  . Financial resource strain: Not on file  . Food insecurity    Worry: Not on file    Inability: Not on file  . Transportation needs    Medical: Not on file    Non-medical: Not on file   Tobacco Use  . Smoking status: Never Smoker  . Smokeless tobacco: Never Used  Substance and Sexual Activity  . Alcohol use: No  . Drug use: No  . Sexual activity: Yes    Birth control/protection: I.U.D.    Comment: Mirena inserted 2015/2016  Lifestyle  . Physical activity    Days per week: Not on file    Minutes per session: Not on file  . Stress: Not on file  Relationships  . Social Herbalist on phone: Not on file    Gets together: Not on file    Attends religious service: Not on file  Active member of club or organization: Not on file    Attends meetings of clubs or organizations: Not on file    Relationship status: Not on file  . Intimate partner violence    Fear of current or ex partner: Not on file    Emotionally abused: Not on file    Physically abused: Not on file    Forced sexual activity: Not on file  Other Topics Concern  . Not on file  Social History Narrative  . Not on file   Current Outpatient Medications on File Prior to Visit  Medication Sig Dispense Refill  . cholecalciferol (VITAMIN D) 1000 units tablet Take 1,000 Units by mouth daily.    . Diclofenac Sodium (PENNSAID) 2 % SOLN Place 2 g onto the skin 2 (two) times daily. 112 g 3  . diltiazem (DILACOR XR) 120 MG 24 hr capsule Take 1 capsule (120 mg total) by mouth daily. 90 capsule 3  . famotidine (PEPCID) 20 MG tablet Take 1 tablet (20 mg total) by mouth 2 (two) times daily. 60 tablet 3  . Fe Fum-FePoly-Vit C-Vit B3 (INTEGRA) 62.5-62.5-40-3 MG CAPS Take 1 capsule by mouth daily. 90 capsule 3  . gabapentin (NEURONTIN) 100 MG capsule Take 1 capsule (100 mg total) by mouth at bedtime. 90 capsule 1  . levonorgestrel (MIRENA, 52 MG,) 20 MCG/24HR IUD 1 each by Intrauterine route once.     [name] losartan (COZAAR) 50 MG tablet TAKE 1 TABLET (50 MG TOTAL) BY MOUTH DAILY. -- OFFICE VISIT NEEDED FOR FURTHER REFILLS 90 tablet 0  . methocarbamol (ROBAXIN) 500 MG tablet TAKE 1 TABLET (500 MG TOTAL) BY MOUTH  EVERY 6 (SIX) HOURS AS NEEDED FOR MUSCLE SPASMS. 90 tablet 0  . metoprolol succinate (TOPROL-XL) 50 MG 24 hr tablet TAKE 1 TABLET (50 MG TOTAL) BY MOUTH DAILY. TAKE WITH OR IMMEDIATELY FOLLOWING A MEAL. 90 tablet 1  . Multiple Vitamin (MULTIVITAMIN) tablet Take 1 tablet by mouth daily.    . Olopatadine HCl (PATADAY) 0.2 % SOLN Place 1 drop into both eyes daily as needed (itching).     . ondansetron (ZOFRAN ODT) 4 MG disintegrating tablet Take 1 tablet (4 mg total) by mouth every 8 (eight) hours as needed for nausea or vomiting. 40 tablet 1  . UNITHROID 100 MCG tablet Take 1 tablet (100 mcg total) by mouth daily before breakfast. 90 tablet 2   No current facility-administered medications on file prior to visit.    Allergies  Allergen Reactions  . Prednisone Other (See Comments)    Raises blood sugar so high that she receives intensive care  . Prozac [Fluoxetine Hcl] Nausea Only  . Hydrocodone Hives, Itching and Rash    On thighs    Family History  Problem Relation Age of Onset  . Hypertension Mother   . Cancer Mother   . Breast cancer Mother 53  . Hypertension Father   . Diabetes Father   . Cancer Father   . Ovarian cancer Maternal Aunt   . Thyroid disease Maternal Aunt        hypothyroid   PE: There were no vitals taken for this visit. Wt Readings from Last 3 Encounters:  01/14/19 (!) 366 lb 9.6 oz (166.3 kg)  12/17/18 (!) 361 lb (163.7 kg)  12/03/18 (!) 361 lb (163.7 kg)   Constitutional:  in NAD  The physical exam was not performed (virtual visit).  ASSESSMENT: 1. Hypothyroidism - After RAI treatment for Graves' disease  2. DM 2/2 steroid  use  PLAN:  1. Patient with longstanding, uncontrolled, hypothyroidism, on Unithroid.  At last visit, she complained of dizziness, nausea, hot and cold flashes, and overall feeling poorly and a TSH returned higher than target right before last visit.  Her Unithroid dose was increased to 112 mcg daily 2 weeks prior to her last  appointment.  She decreased the dose to 100 mcg daily to see if this would help with the symptoms and I advised her to return to the have another set of labs in another month.  She did not return for labs. - latest thyroid labs reviewed with pt >> elevated 10/2018 - she continues on LT4 100 mcg daily - pt feels better on this dose and her symptoms resolved. - we discussed about taking the thyroid hormone every day, with water, >30 minutes before breakfast, separated by >4 hours from acid reflux medications, calcium, iron, multivitamins. Pt. is taking it correctly. - will check thyroid tests when she returns to the clinic: TSH and fT4 -I will see her back in 2 months  2. DM 2/2 steroid use -She would want me to also manage this - Reviewed sugars at home.  She is checking seldom, whenever she checks, her sugars are in the 130s in the mornings.  It is unclear how they are changing later in the day - We discussed about targets for blood sugars: Before meals 80-130, after meals lower than 180 - advised to start checking sugars at different times of the day - 1x a day, rotating check times - Her diabetes is now diet controlled and will continue this especially now that she feels that her sugars did improve after her last HbA1c - return to clinic in 2 months: We will repeat an HbA1c then and will review her sugar log to see if she needs intensification of treatment  Philemon Kingdom, MD PhD [facility] Endocrinology

## 2019-04-30 NOTE — Patient Instructions (Addendum)
Please continue Unithroid 100 mcg daily.  Take the thyroid hormone every day, with water, at least 30 minutes before breakfast, separated by at least 4 hours from: - acid reflux medications - calcium - iron - multivitamins  Please stop Biotin 1 week before our next visit.  Start checking sugars 1x a day, rotating check times.  Please come back for a follow-up appointment in 2 months.

## 2019-05-05 ENCOUNTER — Encounter: Payer: Self-pay | Admitting: Neurology

## 2019-05-06 ENCOUNTER — Ambulatory Visit: Payer: Self-pay | Admitting: Adult Health

## 2019-05-06 NOTE — Progress Notes (Deleted)
53 y.o. G90P0 Married [place] American female here for annual exam.    PCP:     No LMP recorded. (Menstrual status: IUD).           Sexually active: {yes no:314532}  The current method of family planning is tubal ligation/Mirena IUD 01-12-16.    Exercising: {yes no:314532}  {types:19826} Smoker:  no  Health Maintenance: Pap:05-02-18 Neg:Neg HR HPV, 2017 normal per patient History of abnormal Pap:  {YES NO:22349} MMG:  *** Colonoscopy:  *** BMD:   ***  Result  *** TDaP:  ***Td 2006 Gardasil:   no HIV:01-16-18 NR Hep C: Screening Labs:  Hb today: ***, Urine today: ***   reports that she has never smoked. She has never used smokeless tobacco. She reports that she does not drink alcohol or use drugs.  Past Medical History:  Diagnosis Date  . Abnormal uterine bleeding   . Anemia   . Depression   . Diabetes mellitus without complication (HCC)    gestational  . Elevated blood-pressure reading without diagnosis of hypertension   . Endometriosis   . Fibroids   . GERD (gastroesophageal reflux disease)   . Graves disease   . Grief at loss of child   . H/O gestational diabetes mellitus, not currently pregnant 09/28/2015  . Hypertension   . Hypothyroidism   . Interstitial cystitis   . Menorrhagia   . Oropharyngeal candidiasis 01/16/2018  . OSA (obstructive sleep apnea), severe 06/23/2018  . Premature ventricular contractions    a. rare PVC by monitor 12/2016.  . Right ovarian cyst    3 cm.  . Thyroid disease   . Uterine leiomyoma     Past Surgical History:  Procedure Laterality Date  . APPENDECTOMY    . ARTERY BIOPSY Left 10/25/2017   Procedure: BIOPSY TEMPORAL ARTERY;  Surgeon: Aviva Signs, MD;  Location: [facility];  Service: General;  Laterality: Left;  . CESAREAN SECTION    . CHOLECYSTECTOMY    . HERNIA REPAIR     incisional  . TUMOR REMOVAL  fibroids    Current Outpatient Medications  Medication Sig Dispense Refill  . cholecalciferol (VITAMIN D) 1000 units tablet Take  1,000 Units by mouth daily.    . Diclofenac Sodium (PENNSAID) 2 % SOLN Place 2 g onto the skin 2 (two) times daily. 112 g 3  . diltiazem (DILACOR XR) 120 MG 24 hr capsule Take 1 capsule (120 mg total) by mouth daily. 90 capsule 3  . famotidine (PEPCID) 20 MG tablet Take 1 tablet (20 mg total) by mouth 2 (two) times daily. 60 tablet 3  . Fe Fum-FePoly-Vit C-Vit B3 (INTEGRA) 62.5-62.5-40-3 MG CAPS Take 1 capsule by mouth daily. 90 capsule 3  . gabapentin (NEURONTIN) 100 MG capsule Take 1 capsule (100 mg total) by mouth at bedtime. 90 capsule 1  . levonorgestrel (MIRENA, 52 MG,) 20 MCG/24HR IUD 1 each by Intrauterine route once.     [name] losartan (COZAAR) 50 MG tablet TAKE 1 TABLET (50 MG TOTAL) BY MOUTH DAILY. -- OFFICE VISIT NEEDED FOR FURTHER REFILLS 90 tablet 0  . methocarbamol (ROBAXIN) 500 MG tablet TAKE 1 TABLET (500 MG TOTAL) BY MOUTH EVERY 6 (SIX) HOURS AS NEEDED FOR MUSCLE SPASMS. 90 tablet 0  . metoprolol succinate (TOPROL-XL) 50 MG 24 hr tablet TAKE 1 TABLET (50 MG TOTAL) BY MOUTH DAILY. TAKE WITH OR IMMEDIATELY FOLLOWING A MEAL. 90 tablet 1  . Multiple Vitamin (MULTIVITAMIN) tablet Take 1 tablet by mouth daily.    [name]  Olopatadine HCl (PATADAY) 0.2 % SOLN Place 1 drop into both eyes daily as needed (itching).     . ondansetron (ZOFRAN ODT) 4 MG disintegrating tablet Take 1 tablet (4 mg total) by mouth every 8 (eight) hours as needed for nausea or vomiting. 40 tablet 1  . UNITHROID 100 MCG tablet Take 1 tablet (100 mcg total) by mouth daily before breakfast. 90 tablet 2   No current facility-administered medications for this visit.     Family History  Problem Relation Age of Onset  . Hypertension Mother   . Cancer Mother   . Breast cancer Mother 27  . Hypertension Father   . Diabetes Father   . Cancer Father   . Ovarian cancer Maternal Aunt   . Thyroid disease Maternal Aunt        hypothyroid    Review of Systems  Exam:   There were no vitals taken for this visit.    General  appearance: alert, cooperative and appears stated age Head: normocephalic, without obvious abnormality, atraumatic Neck: no adenopathy, supple, symmetrical, trachea midline and thyroid normal to inspection and palpation Lungs: clear to auscultation bilaterally Breasts: normal appearance, no masses or tenderness, No nipple retraction or dimpling, No nipple discharge or bleeding, No axillary adenopathy Heart: regular rate and rhythm Abdomen: soft, non-tender; no masses, no organomegaly Extremities: extremities normal, atraumatic, no cyanosis or edema Skin: skin color, texture, turgor normal. No rashes or lesions Lymph nodes: cervical, supraclavicular, and axillary nodes normal. Neurologic: grossly normal  Pelvic: External genitalia:  no lesions              No abnormal inguinal nodes palpated.              Urethra:  normal appearing urethra with no masses, tenderness or lesions              Bartholins and Skenes: normal                 Vagina: normal appearing vagina with normal color and discharge, no lesions              Cervix: no lesions              Pap taken: {yes no:314532} Bimanual Exam:  Uterus:  normal size, contour, position, consistency, mobility, non-tender              Adnexa: no mass, fullness, tenderness              Rectal exam: {yes no:314532}.  Confirms.              Anus:  normal sphincter tone, no lesions  Chaperone was present for exam.  Assessment:   Well woman visit with normal exam.   Plan: Mammogram screening discussed. Self breast awareness reviewed. Pap and HR HPV as above. Guidelines for Calcium, Vitamin D, regular exercise program including cardiovascular and weight bearing exercise.   Follow up annually and prn.   Additional counseling given.  {yes [id number]. _______ minutes face to face time of which over 50% was spent in counseling.    After visit summary provided.

## 2019-05-07 ENCOUNTER — Telehealth: Payer: Self-pay | Admitting: Obstetrics and Gynecology

## 2019-05-07 ENCOUNTER — Ambulatory Visit: Payer: BLUE CROSS/BLUE SHIELD | Admitting: Obstetrics and Gynecology

## 2019-05-07 NOTE — Telephone Encounter (Signed)
Patient cancelled/rescheduled this afternoon's appointment. Patient unprepared to self pay.

## 2019-05-18 ENCOUNTER — Telehealth: Payer: Self-pay | Admitting: Hematology and Oncology

## 2019-05-18 NOTE — Telephone Encounter (Signed)
[facility] transfer to [place]. Left message re patient office to set up lab/fu.

## 2019-05-20 NOTE — Telephone Encounter (Signed)
[facility] transfer to [place]. Left message re calling office to set up lab/fu. Second attempt.

## 2019-05-26 ENCOUNTER — Telehealth: Payer: Self-pay | Admitting: Adult Health

## 2019-05-28 ENCOUNTER — Other Ambulatory Visit: Payer: Self-pay | Admitting: Gastroenterology

## 2019-06-15 ENCOUNTER — Ambulatory Visit: Payer: Self-pay

## 2019-06-15 NOTE — Telephone Encounter (Signed)
Dizziness woke pt up feels like someone is pouring fluid over head then chills, nausea, and when standing walking requires assistance feels weak and legs feel wobbly- Headache x 2 days, took Tylenol. Trouble with vision since being on Prednisone x 1 year.   Mouth dry and very thirsty-voided normal amount light yellow. Pt with h/o HTN but does not have a monitor. Pt stated that she can walk unassisted. Pt denies numbness or tingling to face, arms or legs.   Reason for Disposition . [1] MODERATE dizziness (e.g., interferes with normal activities) AND [2] has NOT been evaluated by physician for this  (Exception: dizziness caused by heat exposure, sudden standing, or poor fluid intake)  Answer Assessment - Initial Assessment Questions 1. DESCRIPTION: "Describe your dizziness."     Feels like water is being poured over head  2. LIGHTHEADED: "Do you feel lightheaded?" (e.g., somewhat faint, woozy, weak upon standing)     yes 3. VERTIGO: "Do you feel like either you or the room is spinning or tilting?" (i.e. vertigo)    no 4. SEVERITY: "How bad is it?"  "Do you feel like you are going to faint?" "Can you stand and walk?"   - MILD - walking normally   - MODERATE - interferes with normal activities (e.g., work, school)    - SEVERE - unable to stand, requires support to walk, feels like passing out now.      severe 5. ONSET:  "When did the dizziness begin?"     0330 6. AGGRAVATING FACTORS: "Does anything make it worse?" (e.g., standing, change in head position)     Standing, walking 7. HEART RATE: "Can you tell me your heart rate?" "How many beats in 15 seconds?"  (Note: not all patients can do this)       no 8. CAUSE: "What do you think is causing the dizziness?"    Pt doesn't know 9. RECURRENT SYMPTOM: "Have you had dizziness before?" If so, ask: "When was the last time?" "What happened that time?"    Yes- iron deficiency  10. OTHER SYMPTOMS: "Do you have any other symptoms?" (e.g., fever, chest  pain, vomiting, diarrhea, bleeding)       Hot and cold chills, loose BM, nausea 11. PREGNANCY: "Is there any chance you are pregnant?" "When was your last menstrual period?"       n/a  Protocols used: DIZZINESS [facility]

## 2019-06-15 NOTE — Patient Instructions (Addendum)
Medications reviewed and updated.  Changes include :   none    Keep me updated through mychart.   De Quervain's Tenosynovitis  De Quervain's tenosynovitis is a condition that causes inflammation of the tendon on the thumb side of the wrist. Tendons are cords of tissue that connect bones to muscles. The tendons in the hand pass through a tunnel called a sheath. A slippery layer of tissue (synovium) lets the tendons move smoothly in the sheath. With de Quervain's tenosynovitis, the sheath swells or thickens, causing friction and pain. The condition is also called de Quervain's disease and de Quervain's syndrome. It occurs most often in women who are 83-60 years old. What are the causes? The exact cause of this condition is not known. It may be associated with overuse of the hand and wrist. What increases the risk? You are more likely to develop this condition if you:  Use your hands far more than normal, especially if you repeat certain movements that involve twisting your hand or using a tight grip.  Are pregnant.  Are a middle-aged woman.  Have rheumatoid arthritis.  Have diabetes. What are the signs or symptoms? The main symptom of this condition is pain on the thumb side of the wrist. The pain may get worse when you grasp something or turn your wrist. Other symptoms may include:  Pain that extends up the forearm.  Swelling of your wrist and hand.  Trouble moving the thumb and wrist.  A sensation of snapping in the wrist.  A bump filled with fluid (cyst) in the area of the pain. How is this diagnosed? This condition may be diagnosed based on:  Your symptoms and medical history.  A physical exam. During the exam, your health care provider may do a simple test Wynn Maudlin test) that involves pulling your thumb and wrist to see if this causes pain. You may also need to have an X-ray. How is this treated? Treatment for this condition may include:  Avoiding any  activity that causes pain and swelling.  Taking medicines. Anti-inflammatory medicines and corticosteroid injections may be used to reduce inflammation and relieve pain.  Wearing a splint.  Having surgery. This may be needed if other treatments do not work. Once the pain and swelling has gone down:  Physical therapy. This includes stretching and strengthening exercises.  Occupational therapy. This includes adjusting how you move your wrist. Follow these instructions at home: If you have a splint:  Wear the splint as told by your health care provider. Remove it only as told by your health care provider.  Loosen the splint if your fingers tingle, become numb, or turn cold and blue.  Keep the splint clean.  If the splint is not waterproof: ? Do not let it get wet. ? Cover it with a watertight covering when you take a bath or a shower. Managing pain, stiffness, and swelling   Avoid movements and activities that cause pain and swelling in the wrist area.  If directed, put ice on the painful area. This may be helpful after doing activities that involve the sore wrist. ? Put ice in a plastic bag. ? Place a towel between your skin and the bag. ? Leave the ice on for 20 minutes, 2-3 times a day.  Move your fingers often to avoid stiffness and to lessen swelling.  Raise (elevate) the injured area above the level of your heart while you are sitting or lying down. General instructions  Return to your  normal activities as told by your health care provider. Ask your health care provider what activities are safe for you.  Take over-the-counter and prescription medicines only as told by your health care provider.  Keep all follow-up visits as told by your health care provider. This is important. Contact a health care provider if:  Your pain medicine does not help.  Your pain gets worse.  You develop new symptoms. Summary  De Quervain's tenosynovitis is a condition that causes  inflammation of the tendon on the thumb side of the wrist.  The condition occurs most often in women who are 25-39 years old.  The exact cause of this condition is not known. It may be associated with overuse of the hand and wrist.  Treatment starts with avoiding activity that causes pain or swelling in the wrist area. Other treatment may include wearing a splint and taking medicine. Sometimes, surgery is needed. This information is not intended to replace advice given to you by your health care provider. Make sure you discuss any questions you have with your health care provider. Document Released: 03/06/2001 Document Revised: 12/12/2017 Document Reviewed: 05/20/2017 Elsevier Patient Education  2020 [employer].

## 2019-06-15 NOTE — Progress Notes (Signed)
Subjective:    Patient ID: Erin [name], female    DOB: [birth year], 53 y.o.   MRN: [id number]  HPI The patient is here for an acute visit for occasional episodes of multiple symptoms.  She takes gabapentin at night.  This is not different.  She uses her CPAP nightly.  These episodes she has been having started even before she was on CPAP.  When she is on the cpap she turns over it feels like something has been poured over her from the top of her head all the way down.  She does not feel right.  She sits up right away.  She feels hot and chills at the same time.  She feels like she has to go to the bathroom - urinarte and have a BM.  She feels wobbly.  She urinates and avoids having a BM b/c that makes it worse.  She will go back to bed and then go back to the bathroom and have a BM and then feels better.  She is sometimes sweats and is dripping in sweat.  She feels lightheaded/dizzy.  Yesterday was the last episode she had and it occurred at 3:30 am.  It last occurred a couple of months.  She never has this during the day.     Right thumb and wrist pain.  She has also been experiencing pain along the lateral aspect of her thumb into her forearm.  It is tender to touch.  Hurts more with movement of the wrist.   Hypertension: She is taking her medication daily. She is compliant with a low sodium diet.   She denies any chest pain or palpitations.  She denies leg swelling.   Medications and allergies reviewed with patient and updated if appropriate.  Patient Active Problem List   Diagnosis Date Noted  . Nausea 02/03/2019  . Patellofemoral arthritis of left knee 11/28/2018  . Left knee pain 11/24/2018  . Lightheadedness 10/24/2018  . Metrorrhagia 10/24/2018  . Lumbar back pain with radiculopathy affecting left lower extremity 10/07/2018  . Right ovarian cyst   . OSA (obstructive sleep apnea), severe 06/23/2018  . Anxiety 03/05/2018  . Chronic nonintractable headache 03/05/2018  .  Hyperlipidemia 01/23/2018  . Elevated liver enzymes   . Diabetes mellitus type 2, uncomplicated ([place]) 123456  . DKA (diabetic ketoacidoses) ([place]) 01/15/2018  . Gastroesophageal reflux disease 10/05/2017  . Temporal arteritis ([place]) 10/03/2017  . Right foot pain 07/15/2017  . Grief at loss of child 11/02/2016  . Hypertension 09/28/2015  . Morbid obesity ([place]) 05/17/2015  . Umbilical hernia [id number]  . Chronic lower back pain 05/17/2015  . Palpitations 05/17/2015  . Iron deficiency anemia 09/03/2007  . Hypothyroidism following radioiodine therapy 09/02/2007  . INTERSTITIAL CYSTITIS 09/01/2007  . ALLERGIC RHINITIS 08/07/2007  . [place] DISEASE, LUMBAR 08/07/2007    Current Outpatient Medications on File Prior to Visit  Medication Sig Dispense Refill  . cholecalciferol (VITAMIN D) 1000 units tablet Take 1,000 Units by mouth daily.    . Diclofenac Sodium (PENNSAID) 2 % SOLN Place 2 g onto the skin 2 (two) times daily. 112 g 3  . diltiazem (DILACOR XR) 120 MG 24 hr capsule Take 1 capsule (120 mg total) by mouth daily. 90 capsule 3  . famotidine (PEPCID) 20 MG tablet TAKE 1 TABLET BY MOUTH TWICE A DAY 180 tablet 1  . Fe Fum-FePoly-Vit C-Vit B3 (INTEGRA) 62.5-62.5-40-3 MG CAPS Take 1 capsule by mouth daily. 90 capsule 3  . gabapentin (NEURONTIN)  100 MG capsule Take 1 capsule (100 mg total) by mouth at bedtime. 90 capsule 1  . levonorgestrel (MIRENA, 52 MG,) 20 MCG/24HR IUD 1 each by Intrauterine route once.     [name] losartan (COZAAR) 50 MG tablet TAKE 1 TABLET (50 MG TOTAL) BY MOUTH DAILY. -- OFFICE VISIT NEEDED FOR FURTHER REFILLS 90 tablet 0  . methocarbamol (ROBAXIN) 500 MG tablet TAKE 1 TABLET (500 MG TOTAL) BY MOUTH EVERY 6 (SIX) HOURS AS NEEDED FOR MUSCLE SPASMS. 90 tablet 0  . metoprolol succinate (TOPROL-XL) 50 MG 24 hr tablet TAKE 1 TABLET (50 MG TOTAL) BY MOUTH DAILY. TAKE WITH OR IMMEDIATELY FOLLOWING A MEAL. 90 tablet 1  . Multiple Vitamin (MULTIVITAMIN) tablet Take 1 tablet by  mouth daily.    . Olopatadine HCl (PATADAY) 0.2 % SOLN Place 1 drop into both eyes daily as needed (itching).     . ondansetron (ZOFRAN ODT) 4 MG disintegrating tablet Take 1 tablet (4 mg total) by mouth every 8 (eight) hours as needed for nausea or vomiting. 40 tablet 1  . UNITHROID 100 MCG tablet Take 1 tablet (100 mcg total) by mouth daily before breakfast. 90 tablet 2   No current facility-administered medications on file prior to visit.    Past Medical History:  Diagnosis Date  . Abnormal uterine bleeding   . Anemia   . Depression   . Diabetes mellitus without complication (HCC)    gestational  . Elevated blood-pressure reading without diagnosis of hypertension   . Endometriosis   . Fibroids   . GERD (gastroesophageal reflux disease)   . Graves disease   . Grief at loss of child   . H/O gestational diabetes mellitus, not currently pregnant 09/28/2015  . Hypertension   . Hypothyroidism   . Interstitial cystitis   . Menorrhagia   . Oropharyngeal candidiasis 01/16/2018  . OSA (obstructive sleep apnea), severe 06/23/2018  . Premature ventricular contractions    a. rare PVC by monitor 12/2016.  . Right ovarian cyst    3 cm.  . Thyroid disease   . Uterine leiomyoma     Past Surgical History:  Procedure Laterality Date  . APPENDECTOMY    . ARTERY BIOPSY Left 10/25/2017   Procedure: BIOPSY TEMPORAL ARTERY;  Surgeon: Aviva Signs, MD;  Location: [facility];  Service: General;  Laterality: Left;  . CESAREAN SECTION    . CHOLECYSTECTOMY    . HERNIA REPAIR     incisional  . TUMOR REMOVAL  fibroids    Social History   Socioeconomic History  . Marital status: Married    Spouse name: Not on file  . Number of children: Not on file  . Years of education: Not on file  . Highest education level: Not on file  Occupational History  . Not on file  Tobacco Use  . Smoking status: Never Smoker  . Smokeless tobacco: Never Used  Substance and Sexual Activity  . Alcohol use: No  . Drug  use: No  . Sexual activity: Yes    Birth control/protection: I.U.D.    Comment: Mirena inserted 2015/2016  Other Topics Concern  . Not on file  Social History Narrative  . Not on file   Social Determinants of Health   Financial Resource Strain:   . Difficulty of Paying Living Expenses: Not on file  Food Insecurity:   . Worried About Charity fundraiser in the Last Year: Not on file  . Ran Out of Food in the Last Year: Not  on file  Transportation Needs:   . Lack of Transportation (Medical): Not on file  . Lack of Transportation (Non-Medical): Not on file  Physical Activity:   . Days of Exercise per Week: Not on file  . Minutes of Exercise per Session: Not on file  Stress:   . Feeling of Stress : Not on file  Social Connections:   . Frequency of Communication with Friends and Family: Not on file  . Frequency of Social Gatherings with Friends and Family: Not on file  . Attends Religious Services: Not on file  . Active Member of Clubs or Organizations: Not on file  . Attends Archivist Meetings: Not on file  . Marital Status: Not on file    Family History  Problem Relation Age of Onset  . Hypertension Mother   . Cancer Mother   . Breast cancer Mother 24  . Hypertension Father   . Diabetes Father   . Cancer Father   . Ovarian cancer Maternal Aunt   . Thyroid disease Maternal Aunt        hypothyroid    Review of Systems  Constitutional: Positive for diaphoresis (with the episodes only). Negative for chills and fever.  Eyes: Negative for visual disturbance.  Respiratory: Negative for cough, shortness of breath and wheezing.   Cardiovascular: Positive for chest pain (tightness after episode only). Negative for palpitations and leg swelling.  Gastrointestinal: Positive for nausea (during episode). Negative for abdominal pain, blood in stool, constipation and diarrhea.  Neurological: Positive for dizziness (during episode), light-headedness (during episode) and  headaches.       Objective:   Vitals:   06/16/19 1048  BP: 138/84  Pulse: 78  Resp: 18  Temp: 98.8 F (37.1 C)  SpO2: 96%   BP Readings from Last 3 Encounters:  06/16/19 138/84  01/14/19 (!) 150/80  12/29/18 134/64   Wt Readings from Last 3 Encounters:  06/16/19 (!) 359 lb (162.8 kg)  01/14/19 (!) 366 lb 9.6 oz (166.3 kg)  12/17/18 (!) 361 lb (163.7 kg)   Body mass index is 59.74 kg/m.   Physical Exam Constitutional:      General: She is not in acute distress.    Appearance: Normal appearance. She is not ill-appearing.  HENT:     Head: Normocephalic and atraumatic.  Cardiovascular:     Rate and Rhythm: Normal rate and regular rhythm.     Heart sounds: No murmur.  Pulmonary:     Effort: Pulmonary effort is normal. No respiratory distress.     Breath sounds: No wheezing or rales.  Musculoskeletal:     Cervical back: Neck supple. No tenderness.     Right lower leg: No edema.     Left lower leg: No edema.  Lymphadenopathy:     Cervical: No cervical adenopathy.  Skin:    General: Skin is warm and dry.  Neurological:     General: No focal deficit present.     Mental Status: She is alert and oriented to person, place, and time.  Psychiatric:        Mood and Affect: Mood normal.        Behavior: Behavior normal.            Assessment & Plan:    See Problem List for Assessment and Plan of chronic medical problems.     This visit occurred during the SARS-CoV-2 public health emergency.  Safety protocols were in place, including screening questions prior to the visit, additional  usage of staff PPE, and extensive cleaning of exam room while observing appropriate contact time as indicated for disinfecting solutions.

## 2019-06-16 ENCOUNTER — Other Ambulatory Visit: Payer: Self-pay

## 2019-06-16 ENCOUNTER — Encounter: Payer: Self-pay | Admitting: Internal Medicine

## 2019-06-16 ENCOUNTER — Ambulatory Visit (INDEPENDENT_AMBULATORY_CARE_PROVIDER_SITE_OTHER): Payer: Self-pay | Admitting: Internal Medicine

## 2019-06-16 DIAGNOSIS — R42 Dizziness and giddiness: Secondary | ICD-10-CM

## 2019-06-16 DIAGNOSIS — M654 Radial styloid tenosynovitis [de Quervain]: Secondary | ICD-10-CM | POA: Insufficient documentation

## 2019-06-16 DIAGNOSIS — I10 Essential (primary) hypertension: Secondary | ICD-10-CM

## 2019-06-16 NOTE — Assessment & Plan Note (Signed)
Thumb/wrist symptoms consistent with de Quervain's tenosynovitis Rest, ice, can wrap or wear a brace Revise activities Can take Advil Can refer to specialist if no improvement

## 2019-06-16 NOTE — Assessment & Plan Note (Signed)
She has had intermittent episodes of multiple symptoms including lightheadedness/dizziness, nausea, feeling hot, chills, diaphoresis, feeling the need to urinate and have a bowel movement Last episode occurred yesterday, 1 before that was 2 months ago Always occurs at night, no symptoms during the day She has not checked her BP or sugar when this occurs and advised her to check both No obvious pattern or cause, started before using CPAP Discussed possible blood work, but since she is not having symptoms during the day and does not currently have insurance we will hold off She will let me know if these persist.  She will update me with her blood pressure and sugar during these times.  She will start recording these episodes to see if we can figure out a pattern

## 2019-06-16 NOTE — Assessment & Plan Note (Signed)
BP well controlled Current regimen effective and well tolerated Continue current medications at current doses  

## 2019-06-17 ENCOUNTER — Encounter: Payer: Self-pay | Admitting: Obstetrics and Gynecology

## 2019-06-17 ENCOUNTER — Encounter: Payer: Self-pay | Admitting: Internal Medicine

## 2019-07-01 ENCOUNTER — Other Ambulatory Visit: Payer: Self-pay | Admitting: Internal Medicine

## 2019-07-02 ENCOUNTER — Ambulatory Visit: Payer: Self-pay | Admitting: Internal Medicine

## 2019-07-06 ENCOUNTER — Encounter: Payer: Self-pay | Admitting: Internal Medicine

## 2019-07-09 ENCOUNTER — Ambulatory Visit: Payer: Self-pay | Admitting: Obstetrics and Gynecology

## 2019-07-16 ENCOUNTER — Telehealth: Payer: Self-pay | Admitting: Obstetrics and Gynecology

## 2019-07-16 NOTE — Telephone Encounter (Signed)
Call placed to patient to follow up in regards to scheduling an ultrasound. Patient previously declined to schedule appointment due to no insurance. Left voicemail message requesting a return call.

## 2019-07-23 NOTE — Telephone Encounter (Signed)
See previous phone note. Call placed to patient to follow up on new insurance information and to scheduled recommended follow up ultrasound. Left voicemail message requesting a return call to main office number [phone]

## 2019-07-26 ENCOUNTER — Encounter: Payer: Self-pay | Admitting: Internal Medicine

## 2019-08-03 ENCOUNTER — Encounter: Payer: Self-pay | Admitting: Neurology

## 2019-08-04 ENCOUNTER — Telehealth (INDEPENDENT_AMBULATORY_CARE_PROVIDER_SITE_OTHER): Payer: 59 | Admitting: Adult Health

## 2019-08-04 ENCOUNTER — Encounter: Payer: Self-pay | Admitting: Internal Medicine

## 2019-08-04 ENCOUNTER — Telehealth: Payer: Self-pay | Admitting: Internal Medicine

## 2019-08-04 ENCOUNTER — Ambulatory Visit (INDEPENDENT_AMBULATORY_CARE_PROVIDER_SITE_OTHER): Payer: 59 | Admitting: Internal Medicine

## 2019-08-04 ENCOUNTER — Other Ambulatory Visit: Payer: Self-pay

## 2019-08-04 ENCOUNTER — Ambulatory Visit: Payer: Self-pay | Admitting: Internal Medicine

## 2019-08-04 DIAGNOSIS — G4733 Obstructive sleep apnea (adult) (pediatric): Secondary | ICD-10-CM

## 2019-08-04 DIAGNOSIS — Z20822 Contact with and (suspected) exposure to covid-19: Secondary | ICD-10-CM

## 2019-08-04 DIAGNOSIS — Z9989 Dependence on other enabling machines and devices: Secondary | ICD-10-CM

## 2019-08-04 NOTE — Telephone Encounter (Signed)
error 

## 2019-08-04 NOTE — Progress Notes (Addendum)
PATIENT: Erin [name] DOB: 1965/10/14  REASON FOR VISIT: follow up HISTORY FROM: patient  Virtual Visit via Video Note  I connected with Erin [name] on 08/04/19 at  9:30 AM EST by a video enabled telemedicine application located remotely at [facility] and verified that I am speaking with the correct person using two identifiers who was located at their own home.   I discussed the limitations of evaluation and management by telemedicine and the availability of in person appointments. The patient expressed understanding and agreed to proceed.   PATIENT: Erin [name] DOB: 1966/04/25  REASON FOR VISIT: follow up HISTORY FROM: patient  HISTORY OF PRESENT ILLNESS: Today 08/04/19:  Erin [name] is a 54 year old female with a history of obstructive sleep apnea on CPAP.  She joins me today for a virtual visit.  Her download indicates that she use her machine nightly for compliance of 100%.  She use her machine greater than 4 hours each night.  On average she uses her machine 7 hours and 47 minutes.  Her residual AHI is 0.3 on 7 to 14 cm of water with EPR of 2.  Her leak in the 95th percentile is 26.9 L/min.  She reports that the CPAP is working well for her.  She does need new supplies.  She does advise today that she feels as if she has Covid.  Woke up with a cold and severe headache.  She plans to go get Covid tested today.  HISTORY Erin [name] is a 54 year old right-handed woman with an underlying complex medical history of hypothyroidism secondary to radioactive iodine treatment for history of Graves' disease, temporal arteritis, hypertension, reflux disease, diabetes, recurrent headaches, low back pain, palpitation, allergic rhinitis, interstitial cystitis, anemia, and morbid obesity with a BMI of over 55, who presents for a virtual, video based appointment via [email or web address] for follow-up consultation of her obstructive sleep apnea, after recent testing and starting  AutoPap therapy.  The patient is unaccompanied today and joins via cell ph from her home, I am located in my office.  I first met her on 05/01/2018 at the request of her primary care physician, at which time she reported snoring, witnessed apneas, daytime somnolence, and morning headaches.  She was advised to proceed with sleep study testing.  Her insurance denied a laboratory attended sleep study.  She had a home sleep test on 06/11/2018 which indicated severe obstructive sleep apnea with an AHI of 57.3/h, O2 nadir of 73%.  She was advised to proceed with AutoPap therapy.  REVIEW OF SYSTEMS: Out of a complete 14 system review of symptoms, the patient complains only of the following symptoms, and all other reviewed systems are negative.  See HPI  ALLERGIES: Allergies  Allergen Reactions  . Prednisone Other (See Comments)    Raises blood sugar so high that she receives intensive care  . Prozac [Fluoxetine Hcl] Nausea Only  . Hydrocodone Hives, Itching and Rash    On thighs     HOME MEDICATIONS: Outpatient Medications Prior to Visit  Medication Sig Dispense Refill  . cholecalciferol (VITAMIN D) 1000 units tablet Take 1,000 Units by mouth daily.    . Diclofenac Sodium (PENNSAID) 2 % SOLN Place 2 g onto the skin 2 (two) times daily. 112 g 3  . diltiazem (DILACOR XR) 120 MG 24 hr capsule Take 1 capsule (120 mg total) by mouth daily. 90 capsule 3  . famotidine (PEPCID) 20 MG tablet TAKE 1 TABLET BY MOUTH TWICE  A DAY 180 tablet 1  . Fe Fum-FePoly-Vit C-Vit B3 (INTEGRA) 62.5-62.5-40-3 MG CAPS Take 1 capsule by mouth daily. 90 capsule 3  . gabapentin (NEURONTIN) 100 MG capsule Take 1 capsule (100 mg total) by mouth at bedtime. 90 capsule 1  . levonorgestrel (MIRENA, 52 MG,) 20 MCG/24HR IUD 1 each by Intrauterine route once.     [name] losartan (COZAAR) 50 MG tablet Take 1 tablet (50 mg total) by mouth daily. 90 tablet 1  . methocarbamol (ROBAXIN) 500 MG tablet TAKE 1 TABLET (500 MG TOTAL) BY MOUTH EVERY  6 (SIX) HOURS AS NEEDED FOR MUSCLE SPASMS. 90 tablet 0  . metoprolol succinate (TOPROL-XL) 50 MG 24 hr tablet TAKE 1 TABLET (50 MG TOTAL) BY MOUTH DAILY. TAKE WITH OR IMMEDIATELY FOLLOWING A MEAL. 90 tablet 1  . Multiple Vitamin (MULTIVITAMIN) tablet Take 1 tablet by mouth daily.    . Olopatadine HCl (PATADAY) 0.2 % SOLN Place 1 drop into both eyes daily as needed (itching).     . ondansetron (ZOFRAN ODT) 4 MG disintegrating tablet Take 1 tablet (4 mg total) by mouth every 8 (eight) hours as needed for nausea or vomiting. 40 tablet 1  . UNITHROID 100 MCG tablet Take 1 tablet (100 mcg total) by mouth daily before breakfast. 90 tablet 2   No facility-administered medications prior to visit.    PAST MEDICAL HISTORY: Past Medical History:  Diagnosis Date  . Abnormal uterine bleeding   . Anemia   . Depression   . Diabetes mellitus without complication (HCC)    gestational  . Elevated blood-pressure reading without diagnosis of hypertension   . Endometriosis   . Fibroids   . GERD (gastroesophageal reflux disease)   . Graves disease   . Grief at loss of child   . H/O gestational diabetes mellitus, not currently pregnant 09/28/2015  . Hypertension   . Hypothyroidism   . Interstitial cystitis   . Menorrhagia   . Oropharyngeal candidiasis 01/16/2018  . OSA (obstructive sleep apnea), severe 06/23/2018  . Premature ventricular contractions    a. rare PVC by monitor 12/2016.  . Right ovarian cyst    3 cm.  . Thyroid disease   . Uterine leiomyoma     PAST SURGICAL HISTORY: Past Surgical History:  Procedure Laterality Date  . APPENDECTOMY    . ARTERY BIOPSY Left 10/25/2017   Procedure: BIOPSY TEMPORAL ARTERY;  Surgeon: Aviva Signs, MD;  Location: [facility];  Service: General;  Laterality: Left;  . CESAREAN SECTION    . CHOLECYSTECTOMY    . HERNIA REPAIR     incisional  . TUMOR REMOVAL  fibroids    FAMILY HISTORY: Family History  Problem Relation Age of Onset  . Hypertension Mother     . Cancer Mother   . Breast cancer Mother 84  . Hypertension Father   . Diabetes Father   . Cancer Father   . Ovarian cancer Maternal Aunt   . Thyroid disease Maternal Aunt        hypothyroid    SOCIAL HISTORY: Social History   Socioeconomic History  . Marital status: Married    Spouse name: Not on file  . Number of children: Not on file  . Years of education: Not on file  . Highest education level: Not on file  Occupational History  . Not on file  Tobacco Use  . Smoking status: Never Smoker  . Smokeless tobacco: Never Used  Substance and Sexual Activity  . Alcohol use: No  .  Drug use: No  . Sexual activity: Yes    Birth control/protection: I.U.D.    Comment: Mirena inserted 2015/2016  Other Topics Concern  . Not on file  Social History Narrative  . Not on file   Social Determinants of Health   Financial Resource Strain:   . Difficulty of Paying Living Expenses: Not on file  Food Insecurity:   . Worried About Charity fundraiser in the Last Year: Not on file  . Ran Out of Food in the Last Year: Not on file  Transportation Needs:   . Lack of Transportation (Medical): Not on file  . Lack of Transportation (Non-Medical): Not on file  Physical Activity:   . Days of Exercise per Week: Not on file  . Minutes of Exercise per Session: Not on file  Stress:   . Feeling of Stress : Not on file  Social Connections:   . Frequency of Communication with Friends and Family: Not on file  . Frequency of Social Gatherings with Friends and Family: Not on file  . Attends Religious Services: Not on file  . Active Member of Clubs or Organizations: Not on file  . Attends Archivist Meetings: Not on file  . Marital Status: Not on file  Intimate Partner Violence:   . Fear of Current or Ex-Partner: Not on file  . Emotionally Abused: Not on file  . Physically Abused: Not on file  . Sexually Abused: Not on file      PHYSICAL EXAM Generalized: Well developed, in no  acute distress   Neurological examination  Mentation: Alert oriented to time, place, history taking. Follows all commands speech and language fluent Cranial nerve II-XII:Extraocular movements were full. Facial symmetry noted. uvula tongue midline. Head turning and shoulder shrug  were normal and symmetric. Motor: Good strength throughout subjectively per patient Sensory: Sensory testing is intact to soft touch on all 4 extremities subjectively per patient Coordination: Cerebellar testing reveals good finger-nose-finger  Gait and station: Patient is able to stand from a seated position Reflexes: UTA  DIAGNOSTIC DATA (LABS, IMAGING, TESTING) - I reviewed patient records, labs, notes, testing and imaging myself where available.  Lab Results  Component Value Date   WBC 11.2 (H) 02/03/2019   HGB 11.8 (L) 02/03/2019   HCT 36.1 02/03/2019   MCV 83.3 02/03/2019   PLT 323.0 02/03/2019      Component Value Date/Time   NA 141 01/14/2019 0943   NA 140 12/05/2016 1616   K 3.9 01/14/2019 0943   CL 106 01/14/2019 0943   CO2 24 01/14/2019 0943   GLUCOSE 149 (H) 01/14/2019 0943   BUN 14 01/14/2019 0943   BUN 12 12/05/2016 1616   CREATININE 0.82 01/14/2019 0943   CALCIUM 9.2 01/14/2019 0943   PROT 7.1 01/14/2019 0943   ALBUMIN 3.5 01/14/2019 0943   AST 14 (L) 01/14/2019 0943   ALT 19 01/14/2019 0943   ALKPHOS 172 (H) 01/14/2019 0943   BILITOT 0.5 01/14/2019 0943   GFRNONAA >60 01/14/2019 0943   GFRAA >60 01/14/2019 0943   Lab Results  Component Value Date   CHOL 224 (H) 01/18/2018   HDL 65 01/18/2018   LDLCALC 130 (H) 01/18/2018   TRIG 145 01/18/2018   CHOLHDL 3.4 01/18/2018   Lab Results  Component Value Date   HGBA1C 7.2 (H) 02/03/2019   Lab Results  Component Value Date   VITAMINB12 705 01/14/2019   Lab Results  Component Value Date   TSH 8.72 (H) 10/24/2018  ASSESSMENT AND PLAN 54 y.o. year old female  has a past medical history of Abnormal uterine bleeding,  Anemia, Depression, Diabetes mellitus without complication ([place]), Elevated blood-pressure reading without diagnosis of hypertension, Endometriosis, Fibroids, GERD (gastroesophageal reflux disease), Graves disease, Grief at loss of child, H/O gestational diabetes mellitus, not currently pregnant (09/28/2015), Hypertension, Hypothyroidism, Interstitial cystitis, Menorrhagia, Oropharyngeal candidiasis (01/16/2018), OSA (obstructive sleep apnea), severe (06/23/2018), Premature ventricular contractions, Right ovarian cyst, Thyroid disease, and Uterine leiomyoma. here with :  1. Obstructive sleep apnea on CPAP  Patient CPAP download shows excellent compliance and good treatment of her apnea.  She is encouraged to continue using CPAP nightly and greater than 4 hours each night.  She is advised that if her symptoms worsen or she develops new symptoms she should let us know.  She will follow-up in 1 year or sooner if needed  Patient was advised that if she begins having difficulty breathing, chest pain or any other concerning symptoms she should go to the emergency room.   I spent 15 minutes with the patient. 50% of this time was spent reviewing CPAP download   Ward Givens, MSN, NP-C 08/04/2019, 10:07 AM [facility] [address] [place], [place] [address] [phone]  I reviewed the above note and documentation by the Nurse Practitioner and agree with the history, exam, assessment and plan as outlined above. I was available for consultation. Star Age, MD, PhD [facility]

## 2019-08-04 NOTE — Assessment & Plan Note (Signed)
Acute Symptoms started 3 days ago and consistent with possible Covid Her daughter and son both have Covid and they live with her [facility]  Advised that she should let me know the results Discussed that an antibiotic will not help at this time Symptomatic treatment only Advised that she should let me know if her symptoms change, worsen or if she has any questions

## 2019-08-04 NOTE — Progress Notes (Signed)
Virtual Visit via Telephone Note  I connected with Erin [name] on 08/04/19 at 10:30 AM EST by telephone and verified that I am speaking with the correct person using two identifiers.   I discussed the limitations of evaluation and management by telemedicine and the availability of in person appointments. The patient expressed understanding and agreed to proceed.  Present for the visit:  Myself, Dr Billey Gosling, Huey Romans.  The patient is currently at home and I am in the office.    No referring provider.    History of Present Illness: This is an acute visit for exposure to covid.  Everyone around her has covid.  Her son and daughter.   She started having symptoms three days ago.   [facility]    She did take Coricidin and it helped.  She wondered if she could get a Z-Pak.  She is experiencing fatigue, nasal congestion, sore throat, runny nose, postnasal drip, cough, occasional chest pain, diarrhea and headaches.  She has chronic shortness of breath and has not noted any change.  She has not had any fevers.  She denies body aches.   Review of Systems  Constitutional: Positive for malaise/fatigue. Negative for fever.  HENT: Positive for congestion and sore throat.        Runny nose.  , PND, No change in taste or smell  Respiratory: Positive for cough and shortness of breath (chronic). Negative for wheezing.   Cardiovascular: Positive for chest pain (occ).  Gastrointestinal: Positive for diarrhea.  Musculoskeletal: Negative for back pain and myalgias.  Neurological: Positive for headaches.     Social History   Socioeconomic History  . Marital status: Married    Spouse name: Not on file  . Number of children: Not on file  . Years of education: Not on file  . Highest education level: Not on file  Occupational History  . Not on file  Tobacco Use  . Smoking status: Never Smoker  . Smokeless tobacco: Never Used  Substance and  Sexual Activity  . Alcohol use: No  . Drug use: No  . Sexual activity: Yes    Birth control/protection: I.U.D.    Comment: Mirena inserted 2015/2016  Other Topics Concern  . Not on file  Social History Narrative  . Not on file   Social Determinants of Health   Financial Resource Strain:   . Difficulty of Paying Living Expenses: Not on file  Food Insecurity:   . Worried About Charity fundraiser in the Last Year: Not on file  . Ran Out of Food in the Last Year: Not on file  Transportation Needs:   . Lack of Transportation (Medical): Not on file  . Lack of Transportation (Non-Medical): Not on file  Physical Activity:   . Days of Exercise per Week: Not on file  . Minutes of Exercise per Session: Not on file  Stress:   . Feeling of Stress : Not on file  Social Connections:   . Frequency of Communication with Friends and Family: Not on file  . Frequency of Social Gatherings with Friends and Family: Not on file  . Attends Religious Services: Not on file  . Active Member of Clubs or Organizations: Not on file  . Attends Archivist Meetings: Not on file  . Marital Status: Not on file       Assessment and Plan:  See Problem List for Assessment and Plan of chronic  medical problems.   Follow Up Instructions:    I discussed the assessment and treatment plan with the patient. The patient was provided an opportunity to ask questions and all were answered. The patient agreed with the plan and demonstrated an understanding of the instructions.   The patient was advised to call back or seek an in-person evaluation if the symptoms worsen or if the condition fails to improve as anticipated.  Time spent on telephone:  8 minutes  Binnie Rail, MD

## 2019-08-08 ENCOUNTER — Other Ambulatory Visit: Payer: Self-pay | Admitting: Internal Medicine

## 2019-08-26 ENCOUNTER — Other Ambulatory Visit: Payer: Self-pay | Admitting: Internal Medicine

## 2019-08-27 ENCOUNTER — Encounter: Payer: Self-pay | Admitting: Internal Medicine

## 2019-09-02 ENCOUNTER — Ambulatory Visit: Payer: Self-pay | Admitting: Internal Medicine

## 2019-09-14 ENCOUNTER — Ambulatory Visit: Payer: 59 | Admitting: Internal Medicine

## 2019-09-14 ENCOUNTER — Encounter: Payer: Self-pay | Admitting: Certified Nurse Midwife

## 2019-09-15 NOTE — Progress Notes (Signed)
Subjective:    Patient ID: Erin [name], female    DOB: Apr 17, 1966, 54 y.o.   MRN: [id number]  HPI The patient is here for follow up of their chronic medical problems, including hypertension, diabetes, hypothyroidism, hyperlipidemia, iron def anemia d/t menorrhagia, chronic lower back pain, chronic knee pain.  She is taking all of her medications as prescribed.   She is exercising regularly.  She is doing yoga.  She bought herself a body gym and plans on walking, has not started either yet.  She has never weighed this much and knows she needs to get the weight down.  She has tried a little bit of intermittent fasting and different adjustments in her diet and is trying to figure out what is can work best for her.     She feels a vibration in the left side of her chest - she feels it every 1-2 hrs. It lasts 3 seconds.   She has frontal headaches intermittently since covid - it comes every day to every other day.  She is taking tylenol as needed.  She is hydrated.    Medications and allergies reviewed with patient and updated if appropriate.  Patient Active Problem List   Diagnosis Date Noted  . Encounter by telehealth for suspected COVID-19 08/04/2019  . De Quervain's tenosynovitis, right 06/16/2019  . Nausea 02/03/2019  . Patellofemoral arthritis of left knee 11/28/2018  . Left knee pain 11/24/2018  . Episodic lightheadedness 10/24/2018  . Metrorrhagia 10/24/2018  . Lumbar back pain with radiculopathy affecting left lower extremity 10/07/2018  . Right ovarian cyst   . OSA (obstructive sleep apnea), severe 06/23/2018  . Anxiety 03/05/2018  . Chronic nonintractable headache 03/05/2018  . Hyperlipidemia 01/23/2018  . Elevated liver enzymes   . Diabetes mellitus type 2, uncomplicated ([place]) 123456  . DKA (diabetic ketoacidoses) ([place]) 01/15/2018  . Gastroesophageal reflux disease 10/05/2017  . Temporal arteritis ([place]) 10/03/2017  . Right foot pain 07/15/2017  . Grief at  loss of child 11/02/2016  . Hypertension 09/28/2015  . Morbid obesity ([place]) 05/17/2015  . Umbilical hernia [id number]  . Chronic lower back pain 05/17/2015  . Palpitations 05/17/2015  . Iron deficiency anemia 09/03/2007  . Hypothyroidism following radioiodine therapy 09/02/2007  . INTERSTITIAL CYSTITIS 09/01/2007  . ALLERGIC RHINITIS 08/07/2007  . [place] DISEASE, LUMBAR 08/07/2007    Current Outpatient Medications on File Prior to Visit  Medication Sig Dispense Refill  . cholecalciferol (VITAMIN D) 1000 units tablet Take 1,000 Units by mouth daily.    . Diclofenac Sodium (PENNSAID) 2 % SOLN Place 2 g onto the skin 2 (two) times daily. 112 g 3  . diltiazem (DILACOR XR) 120 MG 24 hr capsule Take 1 capsule (120 mg total) by mouth daily. 90 capsule 3  . famotidine (PEPCID) 20 MG tablet TAKE 1 TABLET BY MOUTH TWICE A DAY 180 tablet 1  . Fe Fum-FePoly-Vit C-Vit B3 (INTEGRA) 62.5-62.5-40-3 MG CAPS Take 1 capsule by mouth daily. 90 capsule 3  . gabapentin (NEURONTIN) 100 MG capsule Take 1 capsule (100 mg total) by mouth at bedtime. 90 capsule 1  . levonorgestrel (MIRENA, 52 MG,) 20 MCG/24HR IUD 1 each by Intrauterine route once.     [name] losartan (COZAAR) 50 MG tablet Take 1 tablet (50 mg total) by mouth daily. 90 tablet 1  . methocarbamol (ROBAXIN) 500 MG tablet TAKE 1 TABLET (500 MG TOTAL) BY MOUTH EVERY 6 (SIX) HOURS AS NEEDED FOR MUSCLE SPASMS. 90 tablet 0  . Multiple  Vitamin (MULTIVITAMIN) tablet Take 1 tablet by mouth daily.    . Olopatadine HCl (PATADAY) 0.2 % SOLN Place 1 drop into both eyes daily as needed (itching).     . ondansetron (ZOFRAN ODT) 4 MG disintegrating tablet Take 1 tablet (4 mg total) by mouth every 8 (eight) hours as needed for nausea or vomiting. 40 tablet 1  . UNITHROID 100 MCG tablet TAKE 1 TABLET (100 MCG TOTAL) BY MOUTH DAILY BEFORE BREAKFAST. 90 tablet 2   No current facility-administered medications on file prior to visit.    Past Medical History:  Diagnosis Date    . Abnormal uterine bleeding   . Anemia   . Depression   . Diabetes mellitus without complication (HCC)    gestational  . Elevated blood-pressure reading without diagnosis of hypertension   . Endometriosis   . Fibroids   . GERD (gastroesophageal reflux disease)   . Graves disease   . Grief at loss of child   . H/O gestational diabetes mellitus, not currently pregnant 09/28/2015  . Hypertension   . Hypothyroidism   . Interstitial cystitis   . Menorrhagia   . Oropharyngeal candidiasis 01/16/2018  . OSA (obstructive sleep apnea), severe 06/23/2018  . Premature ventricular contractions    a. rare PVC by monitor 12/2016.  . Right ovarian cyst    3 cm.  . Thyroid disease   . Uterine leiomyoma     Past Surgical History:  Procedure Laterality Date  . APPENDECTOMY    . ARTERY BIOPSY Left 10/25/2017   Procedure: BIOPSY TEMPORAL ARTERY;  Surgeon: Aviva Signs, MD;  Location: [facility];  Service: General;  Laterality: Left;  . CESAREAN SECTION    . CHOLECYSTECTOMY    . HERNIA REPAIR     incisional  . TUMOR REMOVAL  fibroids    Social History   Socioeconomic History  . Marital status: Married    Spouse name: Not on file  . Number of children: Not on file  . Years of education: Not on file  . Highest education level: Not on file  Occupational History  . Not on file  Tobacco Use  . Smoking status: Never Smoker  . Smokeless tobacco: Never Used  Substance and Sexual Activity  . Alcohol use: No  . Drug use: No  . Sexual activity: Yes    Birth control/protection: I.U.D.    Comment: Mirena inserted 2015/2016  Other Topics Concern  . Not on file  Social History Narrative  . Not on file   Social Determinants of Health   Financial Resource Strain:   . Difficulty of Paying Living Expenses:   Food Insecurity:   . Worried About Charity fundraiser in the Last Year:   . Arboriculturist in the Last Year:   Transportation Needs:   . Film/video editor (Medical):   [name] Lack of  Transportation (Non-Medical):   Physical Activity:   . Days of Exercise per Week:   . Minutes of Exercise per Session:   Stress:   . Feeling of Stress :   Social Connections:   . Frequency of Communication with Friends and Family:   . Frequency of Social Gatherings with Friends and Family:   . Attends Religious Services:   . Active Member of Clubs or Organizations:   . Attends Archivist Meetings:   [name] Marital Status:     Family History  Problem Relation Age of Onset  . Hypertension Mother   . Cancer Mother   .  Breast cancer Mother 35  . Hypertension Father   . Diabetes Father   . Cancer Father   . Ovarian cancer Maternal Aunt   . Thyroid disease Maternal Aunt        hypothyroid    Review of Systems  Constitutional: Negative for fever.       Hot flashes  Respiratory: Negative for cough, shortness of breath and wheezing.   Cardiovascular: Negative for chest pain, palpitations and leg swelling.  Gastrointestinal: Positive for nausea (rare).  Neurological: Positive for headaches (frontal). Negative for dizziness and light-headedness.       Objective:   Vitals:   09/16/19 1551  BP: (!) 146/80  Pulse: 86  Resp: 18  Temp: 99 F (37.2 C)  SpO2: 98%   BP Readings from Last 3 Encounters:  09/16/19 (!) 146/80  06/16/19 138/84  01/14/19 (!) 150/80   Wt Readings from Last 3 Encounters:  09/16/19 (!) 363 lb (164.7 kg)  06/16/19 (!) 359 lb (162.8 kg)  01/14/19 (!) 366 lb 9.6 oz (166.3 kg)   Body mass index is 60.41 kg/m.   Physical Exam    Constitutional: Appears well-developed and well-nourished. No distress.  HENT:  Head: Normocephalic and atraumatic.  Neck: Neck supple. No tracheal deviation present. No thyromegaly present.  No cervical lymphadenopathy Cardiovascular: Normal rate, regular rhythm and normal heart sounds.   No murmur heard. No carotid bruit .  No edema Pulmonary/Chest: Effort normal and breath sounds normal. No respiratory distress.  No has no wheezes. No rales.  Skin: Skin is warm and dry. Not diaphoretic.  Psychiatric: Normal mood and affect. Behavior is normal.      Assessment & Plan:    See Problem List for Assessment and Plan of chronic medical problems.    This visit occurred during the SARS-CoV-2 public health emergency.  Safety protocols were in place, including screening questions prior to the visit, additional usage of staff PPE, and extensive cleaning of exam room while observing appropriate contact time as indicated for disinfecting solutions.

## 2019-09-15 NOTE — Patient Instructions (Addendum)
  Blood work was ordered.     Medications reviewed and updated.  Changes include :   none  Your prescription(s) have been submitted to your pharmacy. Please take as directed and contact our office if you believe you are having problem(s) with the medication(s).   Please followup in 6 months   

## 2019-09-16 ENCOUNTER — Other Ambulatory Visit: Payer: Self-pay

## 2019-09-16 ENCOUNTER — Encounter: Payer: Self-pay | Admitting: Internal Medicine

## 2019-09-16 ENCOUNTER — Ambulatory Visit (INDEPENDENT_AMBULATORY_CARE_PROVIDER_SITE_OTHER): Payer: 59 | Admitting: Internal Medicine

## 2019-09-16 VITALS — BP 146/80 | HR 86 | Temp 99.0°F | Resp 18 | Ht 65.0 in | Wt 363.0 lb

## 2019-09-16 DIAGNOSIS — I10 Essential (primary) hypertension: Secondary | ICD-10-CM

## 2019-09-16 DIAGNOSIS — E119 Type 2 diabetes mellitus without complications: Secondary | ICD-10-CM | POA: Diagnosis not present

## 2019-09-16 DIAGNOSIS — E782 Mixed hyperlipidemia: Secondary | ICD-10-CM

## 2019-09-16 DIAGNOSIS — E890 Postprocedural hypothyroidism: Secondary | ICD-10-CM

## 2019-09-16 DIAGNOSIS — N951 Menopausal and female climacteric states: Secondary | ICD-10-CM

## 2019-09-16 DIAGNOSIS — D508 Other iron deficiency anemias: Secondary | ICD-10-CM | POA: Diagnosis not present

## 2019-09-16 DIAGNOSIS — R519 Headache, unspecified: Secondary | ICD-10-CM

## 2019-09-16 DIAGNOSIS — G8929 Other chronic pain: Secondary | ICD-10-CM

## 2019-09-16 MED ORDER — GABAPENTIN 100 MG PO CAPS: ORAL_CAPSULE | ORAL | 1 refills | Status: DC

## 2019-09-16 NOTE — Assessment & Plan Note (Signed)
Having frontal headaches daily to every other day and taking Tylenol as needed Has been persistent since Covid Possibly rebound headaches-advised to stop Tylenol She is hydrating She has had a lot of crying because going to several funerals and she states that also causes the headache so there could be many causes involved Let me know if there is no improvement

## 2019-09-16 NOTE — Assessment & Plan Note (Signed)
Chronic She is starting to exercise and will increase that-stressed the importance of increasing her exercise Low sugar/carbohydrate diet Sugars currently diet controlled Check A1c

## 2019-09-16 NOTE — Assessment & Plan Note (Signed)
Chronic Blood pressure slightly elevated here today She is going to be working on lifestyle changes and weight loss and I stressed the importance of this-we would like to avoid more medication and I have advised her to make the lifestyle changes so that we can do so Continue current medications at current doses Stressed low-sodium diet CMP

## 2019-09-16 NOTE — Assessment & Plan Note (Signed)
Chronic Not currently on medication, but we need to consider medication Check lipid panel, CMP She plans on working on weight loss with regular exercise and adjusting her diet

## 2019-09-16 NOTE — Assessment & Plan Note (Signed)
Management per Dr Gherghe 

## 2019-09-16 NOTE — Assessment & Plan Note (Signed)
Chronic Taking gabapentin at night, which is helping Continue

## 2019-09-16 NOTE — Assessment & Plan Note (Signed)
Chronic-related to menses IUD has helped, but she still has some occasional bleeding Following with GI-we will schedule a follow-up CBC, iron panel

## 2019-09-17 ENCOUNTER — Encounter: Payer: Self-pay | Admitting: Internal Medicine

## 2019-09-17 LAB — IRON,TIBC AND FERRITIN PANEL
%SAT: 16 % (calc) (ref 16–45)
Ferritin: 114 ng/mL (ref 16–232)
Iron: 54 ug/dL (ref 45–160)
TIBC: 331 mcg/dL (calc) (ref 250–450)

## 2019-09-17 LAB — LIPID PANEL
Cholesterol: 210 mg/dL — ABNORMAL HIGH (ref 0–200)
HDL: 40.9 mg/dL (ref >39.00)
LDL Cholesterol: 139 mg/dL — ABNORMAL HIGH (ref 0–99)
NonHDL: 169.29
Total CHOL/HDL Ratio: 5
Triglycerides: 149 mg/dL (ref 0.0–149.0)
VLDL: 29.8 mg/dL (ref 0.0–40.0)

## 2019-09-17 LAB — CBC WITH DIFFERENTIAL/PLATELET
Basophils Absolute: 0.1 10*3/uL (ref 0.0–0.1)
Basophils Relative: 0.6 % (ref 0.0–3.0)
Eosinophils Absolute: 0.1 10*3/uL (ref 0.0–0.7)
Eosinophils Relative: 0.4 % (ref 0.0–5.0)
HCT: 36.4 % (ref 36.0–46.0)
Hemoglobin: 12 g/dL (ref 12.0–15.0)
Lymphocytes Relative: 36.6 % (ref 12.0–46.0)
Lymphs Abs: 4.8 10*3/uL — ABNORMAL HIGH (ref 0.7–4.0)
MCHC: 32.9 g/dL (ref 30.0–36.0)
MCV: 85.1 fl (ref 78.0–100.0)
Monocytes Absolute: 0.7 10*3/uL (ref 0.1–1.0)
Monocytes Relative: 5.7 % (ref 3.0–12.0)
Neutro Abs: 7.4 10*3/uL (ref 1.4–7.7)
Neutrophils Relative %: 56.7 % (ref 43.0–77.0)
Platelets: 352 10*3/uL (ref 150.0–400.0)
RBC: 4.27 Mil/uL (ref 3.87–5.11)
RDW: 14 % (ref 11.5–15.5)
WBC: 13.1 10*3/uL — ABNORMAL HIGH (ref 4.0–10.5)

## 2019-09-17 LAB — COMPREHENSIVE METABOLIC PANEL
ALT: 31 U/L (ref 0–35)
AST: 20 U/L (ref 0–37)
Albumin: 4.2 g/dL (ref 3.5–5.2)
Alkaline Phosphatase: 163 U/L — ABNORMAL HIGH (ref 39–117)
BUN: 15 mg/dL (ref 6–23)
CO2: 30 mEq/L (ref 19–32)
Calcium: 9.7 mg/dL (ref 8.4–10.5)
Chloride: 104 mEq/L (ref 96–112)
Creatinine, Ser: 0.79 mg/dL (ref 0.40–1.20)
GFR: 92.06 mL/min (ref >60.00)
Glucose, Bld: 165 mg/dL — ABNORMAL HIGH (ref 70–99)
Potassium: 4 mEq/L (ref 3.5–5.1)
Sodium: 140 mEq/L (ref 135–145)
Total Bilirubin: 0.5 mg/dL (ref 0.2–1.2)
Total Protein: 7.7 g/dL (ref 6.0–8.3)

## 2019-09-17 LAB — HEMOGLOBIN A1C: Hgb A1c MFr Bld: 7.9 % — ABNORMAL HIGH (ref 4.6–6.5)

## 2019-11-08 ENCOUNTER — Other Ambulatory Visit: Payer: Self-pay | Admitting: Internal Medicine

## 2019-12-08 ENCOUNTER — Telehealth: Payer: Self-pay | Admitting: Obstetrics and Gynecology

## 2019-12-08 NOTE — Telephone Encounter (Signed)
Please reach out to patient to schedule her annual exam.  She will also need follow up of a right ovarian cyst after being seen for her annual exam.

## 2019-12-08 NOTE — Telephone Encounter (Signed)
Left message to call Sharee Pimple, RN at [facility].   Last AEX 05/02/18

## 2019-12-10 NOTE — Telephone Encounter (Signed)
Left message to call Erin Hage, RN at [facility] [phone].   

## 2019-12-23 NOTE — Telephone Encounter (Signed)
MyChart message to patient requesting return call.

## 2020-01-06 NOTE — Telephone Encounter (Signed)
Please send letter recommending annual exam and then follow up of ovarian cyst.

## 2020-01-06 NOTE — Telephone Encounter (Signed)
No return call from patient.  MyChart message not read.  No AEX or f/u scheduled to date.  Routing to Dr. Quincy Simmonds to advise.

## 2020-01-14 NOTE — Telephone Encounter (Signed)
Letter pended

## 2020-01-15 NOTE — Telephone Encounter (Signed)
No pap is anticipated at this time.

## 2020-01-15 NOTE — Telephone Encounter (Signed)
Dr. Quincy Simmonds - is a pap indicated at next AEX? Last pap 05/02/18, normal, neg hpv.

## 2020-01-15 NOTE — Telephone Encounter (Signed)
Letter pended and placed on Dr. Elza Rafter desk for review.

## 2020-01-18 NOTE — Telephone Encounter (Signed)
Letter reviewed and signed by Dr. Quincy Simmonds.   Letter placed in [place] standard mail to address on file.   Encounter closed.

## 2020-01-21 ENCOUNTER — Other Ambulatory Visit: Payer: Self-pay | Admitting: Internal Medicine

## 2020-01-23 ENCOUNTER — Encounter: Payer: Self-pay | Admitting: Internal Medicine

## 2020-01-24 MED ORDER — METHOCARBAMOL 500 MG PO TABS: 500.0000 mg | ORAL_TABLET | Freq: Four times a day (QID) | ORAL | 0 refills | Status: DC | PRN

## 2020-02-15 ENCOUNTER — Encounter: Payer: Self-pay | Admitting: Internal Medicine

## 2020-02-15 NOTE — Progress Notes (Signed)
Virtual Visit via Video Note  I connected with Erin [name] on 02/16/20 at  7:45 AM EDT by a video enabled telemedicine application and verified that I am speaking with the correct person using two identifiers.   I discussed the limitations of evaluation and management by telemedicine and the availability of in person appointments. The patient expressed understanding and agreed to proceed.  Present for the visit:  Myself, Dr Billey Gosling, Huey Romans.  The patient is currently at home and I am in the office.    No referring provider.    History of Present Illness: This is an acute visit for back pain  Three weeks ago I sent in methocarbamol for her back pain - she thought she pulled a muscle.  Her lower back pain is better after about two weeks, but she is now having sciatica on the left.  She has pain in the left hip and runs down the back of the leg.  She denies N/T.  She has a little weakness in the leg.  Massage has helped.  She can not stand too long and walk.  She has just a little pain at night - when she lays on that side.  She denies changes in her bowels/bladder.    She stopped the methocarbamol.  She has not taken anything else.  She does take 100 mg at night.     She has done some stretching and massage and that has helped.  The pain has gotten better in the past 2 days.  Review of Systems  Cardiovascular: Positive for palpitations.  Musculoskeletal: Positive for neck pain.  Neurological: Positive for dizziness (mild at times), weakness and headaches. Negative for tingling.      Social History   Socioeconomic History  . Marital status: Married    Spouse name: Not on file  . Number of children: Not on file  . Years of education: Not on file  . Highest education level: Not on file  Occupational History  . Not on file  Tobacco Use  . Smoking status: Never Smoker  . Smokeless tobacco: Never Used  Vaping Use  . Vaping Use: Never used  Substance and Sexual  Activity  . Alcohol use: No  . Drug use: No  . Sexual activity: Yes    Birth control/protection: I.U.D.    Comment: Mirena inserted 2015/2016  Other Topics Concern  . Not on file  Social History Narrative  . Not on file   Social Determinants of Health   Financial Resource Strain:   . Difficulty of Paying Living Expenses: Not on file  Food Insecurity:   . Worried About Charity fundraiser in the Last Year: Not on file  . Ran Out of Food in the Last Year: Not on file  Transportation Needs:   . Lack of Transportation (Medical): Not on file  . Lack of Transportation (Non-Medical): Not on file  Physical Activity:   . Days of Exercise per Week: Not on file  . Minutes of Exercise per Session: Not on file  Stress:   . Feeling of Stress : Not on file  Social Connections:   . Frequency of Communication with Friends and Family: Not on file  . Frequency of Social Gatherings with Friends and Family: Not on file  . Attends Religious Services: Not on file  . Active Member of Clubs or Organizations: Not on file  . Attends Archivist Meetings: Not on file  . Marital Status: Not on  file     Observations/Objective: Appears well in NAD   Assessment and Plan:  See Problem List for Assessment and Plan of chronic medical problems.   Follow Up Instructions:    I discussed the assessment and treatment plan with the patient. The patient was provided an opportunity to ask questions and all were answered. The patient agreed with the plan and demonstrated an understanding of the instructions.   The patient was advised to call back or seek an in-person evaluation if the symptoms worsen or if the condition fails to improve as anticipated.  Time spent on telephone call: 10 minutes  Binnie Rail, MD

## 2020-02-16 ENCOUNTER — Telehealth (INDEPENDENT_AMBULATORY_CARE_PROVIDER_SITE_OTHER): Payer: Self-pay | Admitting: Internal Medicine

## 2020-02-16 DIAGNOSIS — M5416 Radiculopathy, lumbar region: Secondary | ICD-10-CM

## 2020-02-16 MED ORDER — MELOXICAM 15 MG PO TABS
15.0000 mg | ORAL_TABLET | Freq: Every day | ORAL | 0 refills | Status: DC
Start: 2020-02-16 — End: 2020-09-29

## 2020-02-16 MED ORDER — GABAPENTIN 100 MG PO CAPS: ORAL_CAPSULE | ORAL | 5 refills | Status: DC

## 2020-02-16 NOTE — Assessment & Plan Note (Signed)
Acute Has had the same issue in the past Symptoms consistent with lumbar radiculopathy Slight improvement in the past day or so with stretching and massage-she will continue this Avoid bending, lifting and twisting Increase gabapentin to 300 mg at night and 100 mg in the morning Start meloxicam 15 mg daily-advised to take with food.  If she has any increased vaginal bleeding being on this medication she will need to stop it Discussed that we can slowly increase gabapentin if needed She will call if her symptoms are not continuing to improve so that we can revise treatment Deferred PT at this time

## 2020-03-22 ENCOUNTER — Other Ambulatory Visit: Payer: Self-pay | Admitting: Internal Medicine

## 2020-03-22 ENCOUNTER — Telehealth: Payer: Self-pay | Admitting: Internal Medicine

## 2020-03-22 DIAGNOSIS — E890 Postprocedural hypothyroidism: Secondary | ICD-10-CM

## 2020-03-22 DIAGNOSIS — Z8639 Personal history of other endocrine, nutritional and metabolic disease: Secondary | ICD-10-CM

## 2020-03-22 NOTE — Telephone Encounter (Signed)
Patient is requesting a referral to a Thyroid Eye Doctor - Dr Isidoro Donning or Dr Sarajane Marek.  Patient states is having eye problems and believes it is due to her Graves Disease

## 2020-03-22 NOTE — Telephone Encounter (Signed)
I placed the referral.

## 2020-03-23 ENCOUNTER — Encounter: Payer: 59 | Admitting: Internal Medicine

## 2020-04-20 ENCOUNTER — Encounter: Payer: Self-pay | Admitting: Internal Medicine

## 2020-04-28 ENCOUNTER — Other Ambulatory Visit: Payer: Self-pay | Admitting: Internal Medicine

## 2020-05-09 ENCOUNTER — Telehealth: Payer: Self-pay | Admitting: Internal Medicine

## 2020-05-09 ENCOUNTER — Other Ambulatory Visit: Payer: Self-pay | Admitting: Internal Medicine

## 2020-05-09 DIAGNOSIS — Z8639 Personal history of other endocrine, nutritional and metabolic disease: Secondary | ICD-10-CM

## 2020-05-09 NOTE — Telephone Encounter (Signed)
Patient requesting a referral to an eye dr - she is having eye problems and is not sure if it is in regards to her thyroid.  Dr Sarajane Marek or Dr Isidoro Donning

## 2020-05-09 NOTE — Telephone Encounter (Signed)
I put the referral in to Dr. Sabino Dick

## 2020-05-09 NOTE — Telephone Encounter (Signed)
Please see below.

## 2020-05-10 NOTE — Telephone Encounter (Signed)
Noted  

## 2020-05-11 ENCOUNTER — Ambulatory Visit: Payer: Self-pay | Admitting: Internal Medicine

## 2020-05-31 ENCOUNTER — Encounter: Payer: Self-pay | Admitting: Internal Medicine

## 2020-06-01 ENCOUNTER — Ambulatory Visit: Payer: Self-pay | Admitting: Internal Medicine

## 2020-06-01 NOTE — Progress Notes (Deleted)
Patient ID: Erin [name], female   DOB: 1965/07/09, 54 y.o.   MRN: [id number]   This visit occurred during the SARS-CoV-2 public health emergency.  Safety protocols were in place, including screening questions prior to the visit, additional usage of staff PPE, and extensive cleaning of exam room while observing appropriate contact time as indicated for disinfecting solutions.   HPI  Erin [name] is a 54 y.o.-year-old female, initially referred by her PCP, Dr. Quay [name], presenting for follow-up for postablative hypothyroidism.  She previously saw Dr. Dwyane [name].  Last visit with me 1 year ago (virtual).  Reviewed and addended history: Pt. has been dx with hypothyroidism after RAI treatment for Graves' disease in 08/2008 >> on Unithroid d.a.w.. She had a lot of variability in the TFTs on generic LT4.  In the past she was not taking the medication correctly and we discussed about how to do so.  She takes Unithroid 100 mcg daily: - in am - fasting - at least 30 min from b'fast - no calcium - no iron - + multivitamins more than 4 hours after levothyroxine - + H2 blockers at bedtime - no PPIs - + Biotin  Reviewed her TFTs: Lab Results  Component Value Date   TSH 8.72 (H) 10/24/2018   TSH 1.34 05/16/2018   TSH 0.43 03/13/2018   TSH 1.06 01/29/2018   TSH 0.288 (L) 01/15/2018   TSH 0.14 (L) 11/27/2017   TSH 0.09 (L) 08/27/2017   TSH 18.55 (H) 06/27/2017   TSH 0.16 (L) 10/29/2016   TSH 10.45 (H) 02/14/2016   FREET4 0.83 05/16/2018   FREET4 1.14 11/27/2017   FREET4 1.11 08/27/2017   FREET4 0.67 06/27/2017   FREET4 1.17 10/29/2016   FREET4 0.72 02/14/2016   FREET4 1.07 07/25/2015   FREET4 0.81 11/17/2014   FREET4 0.75 08/03/2014   FREET4 0.68 12/01/2013   T3FREE 6.7 (H) 09/04/2007   Antithyroid antibodies: No results found for: THGAB No components found for: TPOAB  Pt denies: - feeling nodules in neck - hoarseness - dysphagia - choking - SOB with lying down  She has + FH of  thyroid disorders in: mother, M aunts. No FH of thyroid cancer. No h/o radiation tx to head or neck.  No seaweed or kelp. No recent contrast studies. No herbal supplements. No Biotin use. No recent steroids use.   She has a history of uncontrolled diabetes, with DKA in 12/2017.  At last visit, she wanted me to start managing this, also, but she was lost for follow-up afterwards.  Reviewed previous HbA1c levels: Lab Results  Component Value Date   HGBA1C 7.9 (H) 09/16/2019   HGBA1C 7.2 (H) 02/03/2019   HGBA1C 6.9 (H) 10/24/2018   HGBA1C 6.2 (A) 08/29/2018   HGBA1C 10.3 (H) 03/13/2018   HGBA1C 12.7 (H) 01/15/2018   HGBA1C 8.9 (H) 12/13/2017   HGBA1C 6.3 06/11/2017   HGBA1C 6.1 09/28/2015   Her diabetes is diet controlled.  She checks her sugars once a week: - am: 130s - 2h after b'fast: n/c - lunch: n/c - 2h after lunch: n/c - dinner: n/c - 2h after dinner: n/c - bedtime: n/c  No CKD: Lab Results  Component Value Date   BUN 15 09/16/2019   Lab Results  Component Value Date   CREATININE 0.79 09/16/2019  On Cozaar 50.  + HL: Lab Results  Component Value Date   CHOL 210 (H) 09/16/2019   HDL 40.90 09/16/2019   LDLCALC 139 (H) 09/16/2019   TRIG 149.0  09/16/2019   CHOLHDL 5 09/16/2019  She is not on a statin.  Last eye exam: 12/2017: No DR.  She was on high doses on Prednisone (60 mg) in the past, then decreased.  Now off prednisone.  ROS: Constitutional: no weight gain/no weight loss, no fatigue, no subjective hyperthermia, no subjective hypothermia Eyes: no blurry vision, no xerophthalmia ENT: no sore throat, + see HPI Cardiovascular: no CP/no SOB/no palpitations/no leg swelling Respiratory: no cough/no SOB/no wheezing Gastrointestinal: no N/no V/no D/no C/no acid reflux Musculoskeletal: no muscle aches/no joint aches Skin: no rashes, no hair loss Neurological: no tremors/no numbness/no tingling/no dizziness  I reviewed pt's medications, allergies, PMH,  social hx, family hx, and changes were documented in the history of present illness. Otherwise, unchanged from my initial visit note.  Past Medical History:  Diagnosis Date  . Abnormal uterine bleeding   . Anemia   . Depression   . Diabetes mellitus without complication (HCC)    gestational  . Elevated blood-pressure reading without diagnosis of hypertension   . Endometriosis   . Fibroids   . GERD (gastroesophageal reflux disease)   . Graves disease   . Grief at loss of child   . H/O gestational diabetes mellitus, not currently pregnant 09/28/2015  . Hypertension   . Hypothyroidism   . Interstitial cystitis   . Menorrhagia   . Oropharyngeal candidiasis 01/16/2018  . OSA (obstructive sleep apnea), severe 06/23/2018  . Premature ventricular contractions    a. rare PVC by monitor 12/2016.  . Right ovarian cyst    3 cm.  . Thyroid disease   . Uterine leiomyoma    Past Surgical History:  Procedure Laterality Date  . APPENDECTOMY    . ARTERY BIOPSY Left 10/25/2017   Procedure: BIOPSY TEMPORAL ARTERY;  Surgeon: Erin Signs, MD;  Location: [facility];  Service: General;  Laterality: Left;  . CESAREAN SECTION    . CHOLECYSTECTOMY    . HERNIA REPAIR     incisional  . TUMOR REMOVAL  fibroids   Social History   Socioeconomic History  . Marital status: Married    Spouse name: Not on file  . Number of children: Not on file  . Years of education: Not on file  . Highest education level: Not on file  Occupational History  . Not on file  Tobacco Use  . Smoking status: Never Smoker  . Smokeless tobacco: Never Used  Vaping Use  . Vaping Use: Never used  Substance and Sexual Activity  . Alcohol use: No  . Drug use: No  . Sexual activity: Yes    Birth control/protection: I.U.D.    Comment: Mirena inserted 2015/2016  Other Topics Concern  . Not on file  Social History Narrative  . Not on file   Social Determinants of Health   Financial Resource Strain:   . Difficulty of Paying  Living Expenses: Not on file  Food Insecurity:   . Worried About Charity fundraiser in the Last Year: Not on file  . Ran Out of Food in the Last Year: Not on file  Transportation Needs:   . Lack of Transportation (Medical): Not on file  . Lack of Transportation (Non-Medical): Not on file  Physical Activity:   . Days of Exercise per Week: Not on file  . Minutes of Exercise per Session: Not on file  Stress:   . Feeling of Stress : Not on file  Social Connections:   . Frequency of Communication with Friends and Family:  Not on file  . Frequency of Social Gatherings with Friends and Family: Not on file  . Attends Religious Services: Not on file  . Active Member of Clubs or Organizations: Not on file  . Attends Archivist Meetings: Not on file  . Marital Status: Not on file  Intimate Partner Violence:   . Fear of Current or Ex-Partner: Not on file  . Emotionally Abused: Not on file  . Physically Abused: Not on file  . Sexually Abused: Not on file   Current Outpatient Medications on File Prior to Visit  Medication Sig Dispense Refill  . cholecalciferol (VITAMIN D) 1000 units tablet Take 1,000 Units by mouth daily.    . Diclofenac Sodium (PENNSAID) 2 % SOLN Place 2 g onto the skin 2 (two) times daily. 112 g 3  . famotidine (PEPCID) 20 MG tablet TAKE 1 TABLET BY MOUTH TWICE A DAY 180 tablet 1  . Fe Fum-FePoly-Vit C-Vit B3 (INTEGRA) 62.5-62.5-40-3 MG CAPS Take 1 capsule by mouth daily. 90 capsule 3  . gabapentin (NEURONTIN) 100 MG capsule Take 1 capsule by mouth in the morning and 3 capsules by mouth at bedtime. 120 capsule 5  . levonorgestrel (MIRENA, 52 MG,) 20 MCG/24HR IUD 1 each by Intrauterine route once.     [name] losartan (COZAAR) 50 MG tablet TAKE 1 TABLET BY MOUTH EVERY DAY 90 tablet 1  . meloxicam (MOBIC) 15 MG tablet Take 1 tablet (15 mg total) by mouth daily. Take with food. 30 tablet 0  . methocarbamol (ROBAXIN) 500 MG tablet Take 1 tablet (500 mg total) by mouth every 6  (six) hours as needed for muscle spasms. 90 tablet 0  . Multiple Vitamin (MULTIVITAMIN) tablet Take 1 tablet by mouth daily.    . Olopatadine HCl (PATADAY) 0.2 % SOLN Place 1 drop into both eyes daily as needed (itching).     . TIADYLT ER 120 MG 24 hr capsule TAKE 1 CAPSULE BY MOUTH EVERY DAY 90 capsule 3  . UNITHROID 100 MCG tablet TAKE 1 TABLET BY MOUTH DAILY BEFORE BREAKFAST. 90 tablet 0   No current facility-administered medications on file prior to visit.   Allergies  Allergen Reactions  . Prednisone Other (See Comments)    Raises blood sugar so high that she receives intensive care  . Prozac [Fluoxetine Hcl] Nausea Only  . Hydrocodone Hives, Itching and Rash    On thighs    Family History  Problem Relation Age of Onset  . Hypertension Mother   . Cancer Mother   . Breast cancer Mother 47  . Hypertension Father   . Diabetes Father   . Cancer Father   . Ovarian cancer Maternal Aunt   . Thyroid disease Maternal Aunt        hypothyroid   PE: There were no vitals taken for this visit. Wt Readings from Last 3 Encounters:  09/16/19 (!) 363 lb (164.7 kg)  06/16/19 (!) 359 lb (162.8 kg)  01/14/19 (!) 366 lb 9.6 oz (166.3 kg)   Constitutional: overweight, in NAD Eyes: PERRLA, EOMI, no exophthalmos ENT: moist mucous membranes, no thyromegaly, no cervical lymphadenopathy Cardiovascular: RRR, No MRG Respiratory: CTA B Gastrointestinal: abdomen soft, NT, ND, BS+ Musculoskeletal: no deformities, strength intact in all 4 Skin: moist, warm, no rashes Neurological: no tremor with outstretched hands, DTR normal in all 4  ASSESSMENT: 1. Hypothyroidism - After RAI treatment for Graves' disease  2. 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 Patient with longstanding, uncontrolled, hypothyroidism, on  levothyroxine therapy.  She returns after long absence, of the year, despite the advised to return in 2 months after our last, virtual, visit.  She also did not return for repeat thyroid test since  then. - latest thyroid labs reviewed with pt >> TSH is elevated 1.5 years ago: Lab Results  Component Value Date   TSH 8.72 (H) 10/24/2018   - she continues on LT4 100 mcg daily (Unithroid).  She had dizziness, nausea, hot and cold flashes while on 112 mcg daily in the past (unclear if related) - pt feels good on this dose. - we discussed about taking the thyroid hormone every day, with water, >30 minutes before breakfast, separated by >4 hours from acid reflux medications, calcium, iron, multivitamins. Pt. is taking it correctly. - will check thyroid tests today: TSH and fT4 - If labs are abnormal, she will need to return for repeat TFTs in 1.5 months - I will see her back in 4 months  2. 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  However, she was lost for follow-up after last visit a year ago. -Her diabetes is diet controlled -Reviewed latest HbA1c and this is higher, at 7.9%, 08/2019.  At today's visit, HbA1c is ***. -Reviewing her blood sugars at home, *** -I advised her to: There are no Patient Instructions on file for this visit. - advised to check sugars at different times of the day - 1x a day, rotating check times - advised for yearly eye exams >> she is not UTD - return to clinic in 4 months  Philemon Kingdom, MD PhD [facility]

## 2020-06-07 ENCOUNTER — Other Ambulatory Visit: Payer: Self-pay

## 2020-06-07 NOTE — Progress Notes (Signed)
Subjective:    Patient ID: Erin [name], female    DOB: [birth year], 54 y.o.   MRN: [id number]   This visit occurred during the SARS-CoV-2 public health emergency.  Safety protocols were in place, including screening questions prior to the visit, additional usage of staff PPE, and extensive cleaning of exam room while observing appropriate contact time as indicated for disinfecting solutions.    HPI She is here for a physical exam.   She does have insurance now and is trying to catch up on some of her health maintenance.  She was under the impression that when she went off the steroids she no longer had diabetes, this is not the case which we did discuss.  She has not been eating a low sugar/carbohydrate diet.  She is not exercising.  Medications and allergies reviewed with patient and updated if appropriate.  Patient Active Problem List   Diagnosis Date Noted  . Spasm of muscle of lower back 06/08/2020  . Perimenopausal symptoms 09/16/2019  . De Quervain's tenosynovitis, right 06/16/2019  . Nausea 02/03/2019  . Patellofemoral arthritis of left knee 11/28/2018  . Left knee pain 11/24/2018  . Episodic lightheadedness 10/24/2018  . Metrorrhagia 10/24/2018  . Lumbar back pain with radiculopathy affecting left lower extremity 10/07/2018  . Right ovarian cyst   . OSA (obstructive sleep apnea), severe 06/23/2018  . Anxiety 03/05/2018  . Chronic nonintractable headache 03/05/2018  . Hyperlipidemia 01/23/2018  . Elevated liver enzymes   . Diabetes mellitus type 2, uncomplicated ([place]) 93/81/8299  . DKA (diabetic ketoacidoses) 01/15/2018  . Gastroesophageal reflux disease 10/05/2017  . Temporal arteritis ([place]) 10/03/2017  . Right foot pain 07/15/2017  . Grief at loss of child 11/02/2016  . Hypertension 09/28/2015  . Morbid obesity ([place]) 05/17/2015  . Umbilical hernia 37/16/9678  . Chronic lower back pain 05/17/2015  . Palpitations 05/17/2015  . Iron deficiency anemia  09/03/2007  . Hypothyroidism following radioiodine therapy 09/02/2007  . INTERSTITIAL CYSTITIS 09/01/2007  . ALLERGIC RHINITIS 08/07/2007  . [place] DISEASE, LUMBAR 08/07/2007    Current Outpatient Medications on File Prior to Visit  Medication Sig Dispense Refill  . cholecalciferol (VITAMIN D) 1000 units tablet Take 1,000 Units by mouth daily.    . Diclofenac Sodium (PENNSAID) 2 % SOLN Place 2 g onto the skin 2 (two) times daily. 112 g 3  . famotidine (PEPCID) 20 MG tablet TAKE 1 TABLET BY MOUTH TWICE A DAY 180 tablet 1  . gabapentin (NEURONTIN) 100 MG capsule Take 1 capsule by mouth in the morning and 3 capsules by mouth at bedtime. 120 capsule 5  . levonorgestrel (MIRENA) 20 MCG/24HR IUD 1 each by Intrauterine route once.     [name] losartan (COZAAR) 50 MG tablet TAKE 1 TABLET BY MOUTH EVERY DAY 90 tablet 1  . meloxicam (MOBIC) 15 MG tablet Take 1 tablet (15 mg total) by mouth daily. Take with food. 30 tablet 0  . Multiple Vitamin (MULTIVITAMIN) tablet Take 1 tablet by mouth daily.    . Olopatadine HCl 0.2 % SOLN Place 1 drop into both eyes daily as needed (itching).     . TIADYLT ER 120 MG 24 hr capsule TAKE 1 CAPSULE BY MOUTH EVERY DAY 90 capsule 3  . UNITHROID 100 MCG tablet TAKE 1 TABLET BY MOUTH DAILY BEFORE BREAKFAST. 90 tablet 0   No current facility-administered medications on file prior to visit.    Past Medical History:  Diagnosis Date  . Abnormal uterine bleeding   .  Anemia   . Depression   . Diabetes mellitus without complication (HCC)    gestational  . Elevated blood-pressure reading without diagnosis of hypertension   . Endometriosis   . Fibroids   . GERD (gastroesophageal reflux disease)   . Graves disease   . Grief at loss of child   . H/O gestational diabetes mellitus, not currently pregnant 09/28/2015  . Hypertension   . Hypothyroidism   . Interstitial cystitis   . Menorrhagia   . Oropharyngeal candidiasis 01/16/2018  . OSA (obstructive sleep apnea), severe  06/23/2018  . Premature ventricular contractions    a. rare PVC by monitor 12/2016.  . Right ovarian cyst    3 cm.  . Thyroid disease   . Uterine leiomyoma     Past Surgical History:  Procedure Laterality Date  . APPENDECTOMY    . ARTERY BIOPSY Left 10/25/2017   Procedure: BIOPSY TEMPORAL ARTERY;  Surgeon: Aviva Signs, MD;  Location: [facility];  Service: General;  Laterality: Left;  . CESAREAN SECTION    . CHOLECYSTECTOMY    . HERNIA REPAIR     incisional  . TUMOR REMOVAL  fibroids    Social History   Socioeconomic History  . Marital status: Married    Spouse name: Not on file  . Number of children: Not on file  . Years of education: Not on file  . Highest education level: Not on file  Occupational History  . Not on file  Tobacco Use  . Smoking status: Never Smoker  . Smokeless tobacco: Never Used  Vaping Use  . Vaping Use: Never used  Substance and Sexual Activity  . Alcohol use: No  . Drug use: No  . Sexual activity: Yes    Birth control/protection: I.U.D.    Comment: Mirena inserted 2015/2016  Other Topics Concern  . Not on file  Social History Narrative  . Not on file   Social Determinants of Health   Financial Resource Strain: Not on file  Food Insecurity: Not on file  Transportation Needs: Not on file  Physical Activity: Not on file  Stress: Not on file  Social Connections: Not on file    Family History  Problem Relation Age of Onset  . Hypertension Mother   . Cancer Mother   . Breast cancer Mother 26  . Hypertension Father   . Diabetes Father   . Cancer Father   . Ovarian cancer Maternal Aunt   . Thyroid disease Maternal Aunt        hypothyroid    Review of Systems  Constitutional: Positive for chills (occ). Negative for fever.  Eyes: Negative for visual disturbance.       Fogginess in head/eye area - eyes itchy, runny  Respiratory: Positive for shortness of breath (with exertion). Negative for cough and wheezing.   Cardiovascular:  Positive for palpitations. Negative for chest pain and leg swelling.  Gastrointestinal: Positive for abdominal pain (lower abd - achy, intermittent). Negative for blood in stool, constipation, diarrhea and nausea.       GERD  Genitourinary: Negative for dysuria and hematuria.  Musculoskeletal: Positive for arthralgias (mild, knees) and back pain.  Skin: Negative for rash.  Neurological: Positive for headaches. Negative for dizziness.  Psychiatric/Behavioral: Positive for dysphoric mood. The patient is nervous/anxious.        Objective:   Vitals:   06/08/20 1326  BP: 138/72  Pulse: 97  Temp: 98.6 F (37 C)  SpO2: 98%   Filed Weights   06/08/20  1326  Weight: (!) 359 lb (162.8 kg)   Body mass index is 59.74 kg/m.  BP Readings from Last 3 Encounters:  06/08/20 138/72  09/16/19 (!) 146/80  06/16/19 138/84    Wt Readings from Last 3 Encounters:  06/08/20 (!) 359 lb (162.8 kg)  09/16/19 (!) 363 lb (164.7 kg)  06/16/19 (!) 359 lb (162.8 kg)     Physical Exam Constitutional: She appears well-developed and well-nourished. No distress.  HENT:  Head: Normocephalic and atraumatic.  Right Ear: External ear normal. Normal ear canal and TM Left Ear: External ear normal.  Normal ear canal and TM Mouth/Throat: Oropharynx is clear and moist.  Eyes: Conjunctivae and EOM are normal.  Neck: Neck supple. No tracheal deviation present. No thyromegaly present.  No carotid bruit  Cardiovascular: Normal rate, regular rhythm and normal heart sounds.   No murmur heard.  No edema. Pulmonary/Chest: Effort normal and breath sounds normal. No respiratory distress. She has no wheezes. She has no rales.  Breast: deferred   Abdominal: Soft. She exhibits no distension. There is no tenderness.  Lymphadenopathy: She has no cervical adenopathy.  Skin: Skin is warm and dry. She is not diaphoretic.  Psychiatric: She has a normal mood and affect. Her behavior is normal.        Assessment & Plan:    Physical exam: Screening blood work    ordered Immunizations  Flu vaccine today, will get covid booster, dicussed shingrix Colonoscopy  Due - will let me know when she is ready to be referred Mammogram  Due - will schedule Gyn  Up to date  - will follow up Eye exams  Will schedule Exercise  None - stressed regular exercise Weight  Stressed weight loss Substance abuse  none      See Problem List for Assessment and Plan of chronic medical problems.

## 2020-06-07 NOTE — Patient Instructions (Addendum)
Blood work was ordered.     Flu immunization administered today.   Medications changes include :   Methocarbamol 750 mg as needed  Your prescription(s) have been submitted to your pharmacy.      Please followup in 6 months    Health Maintenance, Female Adopting a healthy lifestyle and getting preventive care are important in promoting health and wellness. Ask your health care provider about:  The right schedule for you to have regular tests and exams.  Things you can do on your own to prevent diseases and keep yourself healthy. What should I know about diet, weight, and exercise? Eat a healthy diet   Eat a diet that includes plenty of vegetables, fruits, low-fat dairy products, and lean protein.  Do not eat a lot of foods that are high in solid fats, added sugars, or sodium. Maintain a healthy weight Body mass index (BMI) is used to identify weight problems. It estimates body fat based on height and weight. Your health care provider can help determine your BMI and help you achieve or maintain a healthy weight. Get regular exercise Get regular exercise. This is one of the most important things you can do for your health. Most adults should:  Exercise for at least 150 minutes each week. The exercise should increase your heart rate and make you sweat (moderate-intensity exercise).  Do strengthening exercises at least twice a week. This is in addition to the moderate-intensity exercise.  Spend less time sitting. Even light physical activity can be beneficial. Watch cholesterol and blood lipids Have your blood tested for lipids and cholesterol at 53 years of age, then have this test every 5 years. Have your cholesterol levels checked more often if:  Your lipid or cholesterol levels are high.  You are older than 53 years of age.  You are at high risk for heart disease. What should I know about cancer screening? Depending on your health history and family history, you may  need to have cancer screening at various ages. This may include screening for:  Breast cancer.  Cervical cancer.  Colorectal cancer.  Skin cancer.  Lung cancer. What should I know about heart disease, diabetes, and high blood pressure? Blood pressure and heart disease  High blood pressure causes heart disease and increases the risk of stroke. This is more likely to develop in people who have high blood pressure readings, are of African descent, or are overweight.  Have your blood pressure checked: ? Every 3-5 years if you are 58-29 years of age. ? Every year if you are 51 years old or older. Diabetes Have regular diabetes screenings. This checks your fasting blood sugar level. Have the screening done:  Once every three years after age 16 if you are at a normal weight and have a low risk for diabetes.  More often and at a younger age if you are overweight or have a high risk for diabetes. What should I know about preventing infection? Hepatitis B If you have a higher risk for hepatitis B, you should be screened for this virus. Talk with your health care provider to find out if you are at risk for hepatitis B infection. Hepatitis C Testing is recommended for:  Everyone born from [birth year] through 1964.  Anyone with known risk factors for hepatitis C. Sexually transmitted infections (STIs)  Get screened for STIs, including gonorrhea and chlamydia, if: ? You are sexually active and are younger than 53 years of age. ? You are older than 53  years of age and your health care provider tells you that you are at risk for this type of infection. ? Your sexual activity has changed since you were last screened, and you are at increased risk for chlamydia or gonorrhea. Ask your health care provider if you are at risk.  Ask your health care provider about whether you are at high risk for HIV. Your health care provider may recommend a prescription medicine to help prevent HIV infection. If you  choose to take medicine to prevent HIV, you should first get tested for HIV. You should then be tested every 3 months for as long as you are taking the medicine. Pregnancy  If you are about to stop having your period (premenopausal) and you may become pregnant, seek counseling before you get pregnant.  Take 400 to 800 micrograms (mcg) of folic acid every day if you become pregnant.  Ask for birth control (contraception) if you want to prevent pregnancy. Osteoporosis and menopause Osteoporosis is a disease in which the bones lose minerals and strength with aging. This can result in bone fractures. If you are 64 years old or older, or if you are at risk for osteoporosis and fractures, ask your health care provider if you should:  Be screened for bone loss.  Take a calcium or vitamin D supplement to lower your risk of fractures.  Be given hormone replacement therapy (HRT) to treat symptoms of menopause. Follow these instructions at home: Lifestyle  Do not use any products that contain nicotine or tobacco, such as cigarettes, e-cigarettes, and chewing tobacco. If you need help quitting, ask your health care provider.  Do not use street drugs.  Do not share needles.  Ask your health care provider for help if you need support or information about quitting drugs. Alcohol use  Do not drink alcohol if: ? Your health care provider tells you not to drink. ? You are pregnant, may be pregnant, or are planning to become pregnant.  If you drink alcohol: ? Limit how much you use to 0-1 drink a day. ? Limit intake if you are breastfeeding.  Be aware of how much alcohol is in your drink. In the [place] one drink equals one 12 oz bottle of beer (355 mL), one 5 oz glass of wine (148 mL), or one 1 oz glass of hard liquor (44 mL). General instructions  Schedule regular health, dental, and eye exams.  Stay current with your vaccines.  Tell your health care provider if: ? You often feel  depressed. ? You have ever been abused or do not feel safe at home. Summary  Adopting a healthy lifestyle and getting preventive care are important in promoting health and wellness.  Follow your health care provider's instructions about healthy diet, exercising, and getting tested or screened for diseases.  Follow your health care provider's instructions on monitoring your cholesterol and blood pressure. This information is not intended to replace advice given to you by your health care provider. Make sure you discuss any questions you have with your health care provider. Document Revised: 06/04/2018 Document Reviewed: 06/04/2018 Elsevier Patient Education  2020 [employer].

## 2020-06-08 ENCOUNTER — Ambulatory Visit: Payer: Self-pay | Admitting: Internal Medicine

## 2020-06-08 ENCOUNTER — Encounter: Payer: Self-pay | Admitting: Internal Medicine

## 2020-06-08 ENCOUNTER — Ambulatory Visit (INDEPENDENT_AMBULATORY_CARE_PROVIDER_SITE_OTHER): Payer: BC Managed Care – PPO | Admitting: Internal Medicine

## 2020-06-08 VITALS — BP 138/72 | HR 97 | Temp 98.6°F | Ht 65.0 in | Wt 359.0 lb

## 2020-06-08 DIAGNOSIS — Z0000 Encounter for general adult medical examination without abnormal findings: Secondary | ICD-10-CM | POA: Diagnosis not present

## 2020-06-08 DIAGNOSIS — E119 Type 2 diabetes mellitus without complications: Secondary | ICD-10-CM | POA: Diagnosis not present

## 2020-06-08 DIAGNOSIS — M5416 Radiculopathy, lumbar region: Secondary | ICD-10-CM

## 2020-06-08 DIAGNOSIS — E890 Postprocedural hypothyroidism: Secondary | ICD-10-CM

## 2020-06-08 DIAGNOSIS — Z23 Encounter for immunization: Secondary | ICD-10-CM | POA: Diagnosis not present

## 2020-06-08 DIAGNOSIS — I10 Essential (primary) hypertension: Secondary | ICD-10-CM | POA: Diagnosis not present

## 2020-06-08 DIAGNOSIS — G4733 Obstructive sleep apnea (adult) (pediatric): Secondary | ICD-10-CM | POA: Diagnosis not present

## 2020-06-08 DIAGNOSIS — M62830 Muscle spasm of back: Secondary | ICD-10-CM | POA: Insufficient documentation

## 2020-06-08 DIAGNOSIS — K219 Gastro-esophageal reflux disease without esophagitis: Secondary | ICD-10-CM

## 2020-06-08 LAB — COMPREHENSIVE METABOLIC PANEL
ALT: 20 U/L (ref 0–35)
AST: 14 U/L (ref 0–37)
Albumin: 4.1 g/dL (ref 3.5–5.2)
Alkaline Phosphatase: 161 U/L — ABNORMAL HIGH (ref 39–117)
BUN: 16 mg/dL (ref 6–23)
CO2: 29 mEq/L (ref 19–32)
Calcium: 9.5 mg/dL (ref 8.4–10.5)
Chloride: 103 mEq/L (ref 96–112)
Creatinine, Ser: 0.77 mg/dL (ref 0.40–1.20)
GFR: 87.79 mL/min (ref >60.00)
Glucose, Bld: 204 mg/dL — ABNORMAL HIGH (ref 70–99)
Potassium: 3.9 mEq/L (ref 3.5–5.1)
Sodium: 139 mEq/L (ref 135–145)
Total Bilirubin: 0.6 mg/dL (ref 0.2–1.2)
Total Protein: 7.8 g/dL (ref 6.0–8.3)

## 2020-06-08 LAB — CBC WITH DIFFERENTIAL/PLATELET
Basophils Absolute: 0.1 10*3/uL (ref 0.0–0.1)
Basophils Relative: 0.6 % (ref 0.0–3.0)
Eosinophils Absolute: 0.1 10*3/uL (ref 0.0–0.7)
Eosinophils Relative: 0.8 % (ref 0.0–5.0)
HCT: 37 % (ref 36.0–46.0)
Hemoglobin: 11.8 g/dL — ABNORMAL LOW (ref 12.0–15.0)
Lymphocytes Relative: 41.4 % (ref 12.0–46.0)
Lymphs Abs: 5.9 10*3/uL — ABNORMAL HIGH (ref 0.7–4.0)
MCHC: 31.9 g/dL (ref 30.0–36.0)
MCV: 84.1 fl (ref 78.0–100.0)
Monocytes Absolute: 0.6 10*3/uL (ref 0.1–1.0)
Monocytes Relative: 4.4 % (ref 3.0–12.0)
Neutro Abs: 7.5 10*3/uL (ref 1.4–7.7)
Neutrophils Relative %: 52.8 % (ref 43.0–77.0)
Platelets: 333 10*3/uL (ref 150.0–400.0)
RBC: 4.4 Mil/uL (ref 3.87–5.11)
RDW: 14.5 % (ref 11.5–15.5)
WBC: 14.3 10*3/uL — ABNORMAL HIGH (ref 4.0–10.5)

## 2020-06-08 LAB — LIPID PANEL
Cholesterol: 211 mg/dL — ABNORMAL HIGH (ref 0–200)
HDL: 42.6 mg/dL (ref >39.00)
LDL Cholesterol: 140 mg/dL — ABNORMAL HIGH (ref 0–99)
NonHDL: 168.46
Total CHOL/HDL Ratio: 5
Triglycerides: 141 mg/dL (ref 0.0–149.0)
VLDL: 28.2 mg/dL (ref 0.0–40.0)

## 2020-06-08 LAB — TSH: TSH: 6.72 u[IU]/mL — ABNORMAL HIGH (ref 0.35–4.50)

## 2020-06-08 LAB — HEMOGLOBIN A1C: Hgb A1c MFr Bld: 8.6 % — ABNORMAL HIGH (ref 4.6–6.5)

## 2020-06-08 MED ORDER — METHOCARBAMOL 750 MG PO TABS: 750.0000 mg | ORAL_TABLET | Freq: Four times a day (QID) | ORAL | 2 refills | Status: DC

## 2020-06-08 NOTE — Assessment & Plan Note (Signed)
Chronic Management per Dr Gherghe 

## 2020-06-08 NOTE — Assessment & Plan Note (Signed)
Chronic Blood pressure controlled Continue losartan 50 mg daily, Tiadylt ER 120 mg daily CMP

## 2020-06-08 NOTE — Assessment & Plan Note (Signed)
Chronic GERD currently not controlled because she is out of her medication and it is more expensive-she would like to wait to try anything different until when she discusses with GI Discussed the importance of weight loss at length

## 2020-06-08 NOTE — Assessment & Plan Note (Addendum)
Chronic, intermittent Current dose of methocarbamol not as effective-we will try increasing to 750 mg 4 times a day as needed.  Can increase further if needed

## 2020-06-08 NOTE — Assessment & Plan Note (Signed)
Chronic Taking gabapentin daily and feels this does help.  Taking meloxicam as needed and that also helps Continue meloxicam 50 mg daily as needed and gabapentin 100 mg in the morning and 300 mg at night

## 2020-06-08 NOTE — Assessment & Plan Note (Signed)
Chronic Discussed at length that she is still diabetic Stressed low sugar/carbohydrate diet, weight loss, regular exercise and the importance of these to help control her sugars Discussed that we could have her see a nutritionist if she wishes Check A1c today-discussed that we may need to start her on medication

## 2020-06-08 NOTE — Assessment & Plan Note (Signed)
Chronic Stressed the importance of regular exercise Stressed low sugar/carbohydrate diet especially since she is a diabetic-concentrate on lean protein and vegetables Decrease portions Stressed the importance of weight loss for several reasons

## 2020-06-08 NOTE — Assessment & Plan Note (Addendum)
Chronic Using CPAP nightly Stressed weight loss

## 2020-06-13 MED ORDER — RYBELSUS 3 MG PO TABS: 3.0000 mg | ORAL_TABLET | Freq: Every day | ORAL | 0 refills | Status: DC

## 2020-06-19 ENCOUNTER — Other Ambulatory Visit: Payer: Self-pay | Admitting: Gastroenterology

## 2020-06-22 ENCOUNTER — Encounter: Payer: Self-pay | Admitting: Internal Medicine

## 2020-06-22 ENCOUNTER — Other Ambulatory Visit: Payer: Self-pay

## 2020-06-22 ENCOUNTER — Ambulatory Visit (INDEPENDENT_AMBULATORY_CARE_PROVIDER_SITE_OTHER): Payer: BC Managed Care – PPO | Admitting: Internal Medicine

## 2020-06-22 VITALS — BP 128/80 | HR 90 | Ht 65.0 in | Wt 363.6 lb

## 2020-06-22 DIAGNOSIS — E890 Postprocedural hypothyroidism: Secondary | ICD-10-CM | POA: Diagnosis not present

## 2020-06-22 LAB — T4, FREE: Free T4: 0.87 ng/dL (ref 0.60–1.60)

## 2020-06-22 LAB — TSH: TSH: 5.98 u[IU]/mL — ABNORMAL HIGH (ref 0.35–4.50)

## 2020-06-22 MED ORDER — UNITHROID 112 MCG PO TABS: 112.0000 ug | ORAL_TABLET | Freq: Every day | ORAL | 3 refills | Status: DC

## 2020-06-22 NOTE — Patient Instructions (Addendum)
Please increase Unithroid to 112 mcg daily.  Take the thyroid hormone every day, with water, at least 30 minutes before breakfast, separated by at least 4 hours from: - acid reflux medications - calcium - iron - multivitamins  Please stop at the lab.  Please come back for a follow-up appointment in 6 months.

## 2020-06-22 NOTE — Progress Notes (Signed)
Patient ID: Erin [name], female   DOB: 08-31-1965, 54 y.o.   MRN: [id number]   This visit occurred during the SARS-CoV-2 public health emergency.  Safety protocols were in place, including screening questions prior to the visit, additional usage of staff PPE, and extensive cleaning of exam room while observing appropriate contact time as indicated for disinfecting solutions.   HPI  Erin [name] is a 54 y.o.-year-old female, initially referred by her PCP, Dr. Quay Burow, presenting for follow-up for postablative hypothyroidism.  She previously saw Dr. Dwyane Dee.  Last visit with me 1 year and 2 months ago (virtual).  Since last visit, I referred her to Dr. Sarajane Marek, with ophthalmology per her request.  Reviewed and addended history: Pt. has been dx with hypothyroidism after RAI treatment for Graves' disease in 08/2008 >> on Unithroid d.a.w.. She had a lot of variability in the TFTs on generic LT4.  In the past she was not taking the medication correctly and we discussed about how to do so.  At last visit, TSH was elevated so we increased her LT4 dose.  However, she did not return for repeat labs afterwards and, upon questioning, at this visit, she is still on Unithroid 100 mcg daily...  She is taking this: - in am - fasting - at least 30 min from b'fast - no calcium - no iron - stopped multivitamins - no PPIs - stopped Biotin  Reviewed her TFTs: Lab Results  Component Value Date   TSH 6.72 (H) 06/08/2020   TSH 8.72 (H) 10/24/2018   TSH 1.34 05/16/2018   TSH 0.43 03/13/2018   TSH 1.06 01/29/2018   TSH 0.288 (L) 01/15/2018   TSH 0.14 (L) 11/27/2017   TSH 0.09 (L) 08/27/2017   TSH 18.55 (H) 06/27/2017   TSH 0.16 (L) 10/29/2016   FREET4 0.83 05/16/2018   FREET4 1.14 11/27/2017   FREET4 1.11 08/27/2017   FREET4 0.67 06/27/2017   FREET4 1.17 10/29/2016   FREET4 0.72 02/14/2016   FREET4 1.07 07/25/2015   FREET4 0.81 11/17/2014   FREET4 0.75 08/03/2014   FREET4 0.68 12/01/2013    T3FREE 6.7 (H) 09/04/2007   Antithyroid antibodies: No results found for: THGAB No components found for: TPOAB  Pt denies: - feeling nodules in neck - hoarseness - dysphagia - choking - SOB with lying down  She has + FH of thyroid disorders in: mother, M aunts. No FH of thyroid cancer. No h/o radiation tx to head or neck.  No seaweed or kelp. No recent contrast studies. + herbal supplements (Curcumin). No Biotin use. No recent steroids use.   Uncontrolled diabetes, with DKA in 12/2017.  At last visit, she wanted me to also start managing this, but she was lost to follow-up for me afterwards.  This is is currently managed by PCP.  Reviewed HbA1c levels: Lab Results  Component Value Date   HGBA1C 8.6 (H) 06/08/2020   HGBA1C 7.9 (H) 09/16/2019   HGBA1C 7.2 (H) 02/03/2019   HGBA1C 6.9 (H) 10/24/2018   HGBA1C 6.2 (A) 08/29/2018   HGBA1C 10.3 (H) 03/13/2018   HGBA1C 12.7 (H) 01/15/2018   HGBA1C 8.9 (H) 12/13/2017   HGBA1C 6.3 06/11/2017   HGBA1C 6.1 09/28/2015   Her diabetes was diet controlled in the past but PCP recently added Rybelsus, however, she did not start this yet as it was expensive.  At last visit she was checking sugars once a week and I advised her to check more frequently.  Not checking CBGs now.  No CKD: Lab Results  Component Value Date   BUN 16 06/08/2020   Lab Results  Component Value Date   CREATININE 0.77 06/08/2020  On Cozaar 50.  + HL: Lab Results  Component Value Date   CHOL 211 (H) 06/08/2020   HDL 42.60 06/08/2020   LDLCALC 140 (H) 06/08/2020   TRIG 141.0 06/08/2020   CHOLHDL 5 06/08/2020  She is not on a statin.  Last eye exam: 12/2017: No DR  She was on high doses on Prednisone (60 mg) in the past, then decreased.  Now off prednisone.  ROS: Constitutional: no weight gain/no weight loss, no fatigue, no subjective hyperthermia, no subjective hypothermia Eyes: + occasional blurry vision, + xerophthalmia but eye discharge, no  double vision, no pain with eye movements ENT: no sore throat, + see HPI Cardiovascular: no CP/no SOB/no palpitations/no leg swelling Respiratory: no cough/no SOB/no wheezing Gastrointestinal: no N/no V/no D/no C/no acid reflux Musculoskeletal: no muscle aches/no joint aches Skin: no rashes, no hair loss Neurological: no tremors/no numbness/no tingling/no dizziness  I reviewed pt's medications, allergies, PMH, social hx, family hx, and changes were documented in the history of present illness. Otherwise, unchanged from my initial visit note.  Past Medical History:  Diagnosis Date  . Abnormal uterine bleeding   . Anemia   . Depression   . Diabetes mellitus without complication (HCC)    gestational  . Elevated blood-pressure reading without diagnosis of hypertension   . Endometriosis   . Fibroids   . GERD (gastroesophageal reflux disease)   . Graves disease   . Grief at loss of child   . H/O gestational diabetes mellitus, not currently pregnant 09/28/2015  . Hypertension   . Hypothyroidism   . Interstitial cystitis   . Menorrhagia   . Oropharyngeal candidiasis 01/16/2018  . OSA (obstructive sleep apnea), severe 06/23/2018  . Premature ventricular contractions    a. rare PVC by monitor 12/2016.  . Right ovarian cyst    3 cm.  . Thyroid disease   . Uterine leiomyoma    Past Surgical History:  Procedure Laterality Date  . APPENDECTOMY    . ARTERY BIOPSY Left 10/25/2017   Procedure: BIOPSY TEMPORAL ARTERY;  Surgeon: Franky Macho, MD;  Location: [facility];  Service: General;  Laterality: Left;  . CESAREAN SECTION    . CHOLECYSTECTOMY    . HERNIA REPAIR     incisional  . TUMOR REMOVAL  fibroids   Social History   Socioeconomic History  . Marital status: Married    Spouse name: Not on file  . Number of children: Not on file  . Years of education: Not on file  . Highest education level: Not on file  Occupational History  . Not on file  Tobacco Use  . Smoking status: Never  Smoker  . Smokeless tobacco: Never Used  Vaping Use  . Vaping Use: Never used  Substance and Sexual Activity  . Alcohol use: No  . Drug use: No  . Sexual activity: Yes    Birth control/protection: I.U.D.    Comment: Mirena inserted 2015/2016  Other Topics Concern  . Not on file  Social History Narrative  . Not on file   Social Determinants of Health   Financial Resource Strain: Not on file  Food Insecurity: Not on file  Transportation Needs: Not on file  Physical Activity: Not on file  Stress: Not on file  Social Connections: Not on file  Intimate Partner Violence: Not on file   Current  Outpatient Medications on File Prior to Visit  Medication Sig Dispense Refill  . cholecalciferol (VITAMIN D) 1000 units tablet Take 1,000 Units by mouth daily.    . Diclofenac Sodium (PENNSAID) 2 % SOLN Place 2 g onto the skin 2 (two) times daily. 112 g 3  . famotidine (PEPCID) 20 MG tablet TAKE 1 TABLET BY MOUTH TWICE A DAY 180 tablet 1  . gabapentin (NEURONTIN) 100 MG capsule Take 1 capsule by mouth in the morning and 3 capsules by mouth at bedtime. 120 capsule 5  . levonorgestrel (MIRENA) 20 MCG/24HR IUD 1 each by Intrauterine route once.     [name] losartan (COZAAR) 50 MG tablet TAKE 1 TABLET BY MOUTH EVERY DAY 90 tablet 1  . meloxicam (MOBIC) 15 MG tablet Take 1 tablet (15 mg total) by mouth daily. Take with food. 30 tablet 0  . methocarbamol (ROBAXIN) 750 MG tablet Take 1 tablet (750 mg total) by mouth 4 (four) times daily. 120 tablet 2  . Multiple Vitamin (MULTIVITAMIN) tablet Take 1 tablet by mouth daily.    . Olopatadine HCl 0.2 % SOLN Place 1 drop into both eyes daily as needed (itching).     . Semaglutide (RYBELSUS) 3 MG TABS Take 3 mg by mouth daily before breakfast. 30 tablet 0  . TIADYLT ER 120 MG 24 hr capsule TAKE 1 CAPSULE BY MOUTH EVERY DAY 90 capsule 3  . UNITHROID 100 MCG tablet TAKE 1 TABLET BY MOUTH DAILY BEFORE BREAKFAST. 90 tablet 0   No current facility-administered  medications on file prior to visit.   Allergies  Allergen Reactions  . Prednisone Other (See Comments)    Raises blood sugar so high that she receives intensive care  . Prozac [Fluoxetine Hcl] Nausea Only  . Hydrocodone Hives, Itching and Rash    On thighs   . Metformin And Related     diarrhea   Family History  Problem Relation Age of Onset  . Hypertension Mother   . Cancer Mother   . Breast cancer Mother 31  . Hypertension Father   . Diabetes Father   . Cancer Father   . Ovarian cancer Maternal Aunt   . Thyroid disease Maternal Aunt        hypothyroid   PE: BP 128/80   Pulse 90   Ht 5\' 5"  (1.651 m)   Wt (!) 363 lb 9.6 oz (164.9 kg)   SpO2 98%   BMI 60.51 kg/m  Wt Readings from Last 3 Encounters:  06/22/20 (!) 363 lb 9.6 oz (164.9 kg)  06/08/20 (!) 359 lb (162.8 kg)  09/16/19 (!) 363 lb (164.7 kg)   Constitutional: overweight, in NAD Eyes: PERRLA, EOMI, no exophthalmos ENT: moist mucous membranes, no thyromegaly, no cervical lymphadenopathy Cardiovascular: RRR, No MRG Respiratory: CTA B Gastrointestinal: abdomen soft, NT, ND, BS+ Musculoskeletal: no deformities, strength intact in all 4 Skin: moist, warm, no rashes Neurological: no tremor with outstretched hands, DTR normal in all 4  ASSESSMENT: 1. Hypothyroidism - After RAI treatment for Graves' disease  2.  Eye discomfort  3. 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 Patient with longstanding, uncontrolled, hypothyroidism, on levothyroxine therapy.  She returns after long absence, a year and 2 months, after no showing our appointment from the beginning of the month.  Our last visit was virtual. - At last visit, TSH was elevated so we increased her Unithroid dose from 100 mcg daily to 112 mcg daily, however, she did not return for repeat, and then  she had an appointment with PCP 2 weeks ago.  - latest thyroid labs reviewed with pt >> still elevated TSH: Lab Results  Component Value Date   TSH 6.72 (H) 06/08/2020    -At this visit, upon questioning, she is actually still taking the mcg daily.  Of note, she had dizziness, nausea, hot and cold flashes while on 112 mcg daily in the past but unclear if these are related to the dose.  None of these are present today. - pt feels good on her current dose, but I explained that her tests are abnormal and we do need to increase the dose. - we discussed about taking the thyroid hormone every day, with water, >30 minutes before breakfast, separated by >4 hours from acid reflux medications, calcium, iron, multivitamins. Pt. is taking it correctly. - will check thyroid tests in 1.5 months (I stressed the importance of coming for repeat labs at that time): TSH and fT4 - If labs are abnormal, she will need to return for repeat TFTs in 1.5 months - I will see her back in 6 months, but most likely sooner for labs.  2.  Eye discomfort -She has burning and watering of her eyes and also intermittent blurry vision -She denies pain with eye movement and double vision -I referred her to Dr. Leonard Schwartz last month, but she was not called about the appointment.  At today's visit, we will make sure that the referral goes through.  3. 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  However, she was lost to follow-up for more than a year afterwards. -Most recently, her HbA1c was higher, at 8.6%, increased  -PCP added Rybelsus.  She did not start it yet because of price.  I gave her a coupon for Rybelsus today. -For now, she will continue to see PCP for this problem.  Orders Placed This Encounter  Procedures  . TSH  . T4, free   Philemon Kingdom, MD PhD [facility]

## 2020-06-22 NOTE — Addendum Note (Signed)
Addended by: Waldemar Dickens B on: 06/22/2020 11:47 AM   Modules accepted: Orders

## 2020-06-27 ENCOUNTER — Other Ambulatory Visit: Payer: Self-pay | Admitting: Internal Medicine

## 2020-06-27 MED ORDER — UNITHROID 112 MCG PO TABS: 112.0000 ug | ORAL_TABLET | Freq: Every day | ORAL | 3 refills | Status: DC

## 2020-06-29 ENCOUNTER — Encounter: Payer: Self-pay | Admitting: Internal Medicine

## 2020-07-07 NOTE — Progress Notes (Deleted)
55 y.o. G101P0 Married [place] American female here for annual exam.    PCP:     No LMP recorded. (Menstrual status: IUD).           Sexually active: {yes no:314532}  The current method of family planning is tubal ligation/***Mirena IUD 2015/2016.    Exercising: {yes no:314532}  {types:19826} Smoker:  no  Health Maintenance: Pap: 05-02-18 Neg:Neg HR HPV, 2017 normal per patient History of abnormal Pap:  {YES NO:22349} MMG:  *** Colonoscopy:  ***NEVER BMD:   n/a  Result  n/a TDaP:  *** Gardasil:   no HIV: Neg with pregnancy Hep C: unsure Screening Labs:  Hb today: ***, Urine today: ***   reports that she has never smoked. She has never used smokeless tobacco. She reports that she does not drink alcohol and does not use drugs.  Past Medical History:  Diagnosis Date  . Abnormal uterine bleeding   . Anemia   . Depression   . Diabetes mellitus without complication (HCC)    gestational  . Elevated blood-pressure reading without diagnosis of hypertension   . Endometriosis   . Fibroids   . GERD (gastroesophageal reflux disease)   . Graves disease   . Grief at loss of child   . H/O gestational diabetes mellitus, not currently pregnant 09/28/2015  . Hypertension   . Hypothyroidism   . Interstitial cystitis   . Menorrhagia   . Oropharyngeal candidiasis 01/16/2018  . OSA (obstructive sleep apnea), severe 06/23/2018  . Premature ventricular contractions    a. rare PVC by monitor 12/2016.  . Right ovarian cyst    3 cm.  . Thyroid disease   . Uterine leiomyoma     Past Surgical History:  Procedure Laterality Date  . APPENDECTOMY    . ARTERY BIOPSY Left 10/25/2017   Procedure: BIOPSY TEMPORAL ARTERY;  Surgeon: Aviva Signs, MD;  Location: [facility];  Service: General;  Laterality: Left;  . CESAREAN SECTION    . CHOLECYSTECTOMY    . HERNIA REPAIR     incisional  . TUMOR REMOVAL  fibroids    Current Outpatient Medications  Medication Sig Dispense Refill  . cholecalciferol  (VITAMIN D) 1000 units tablet Take 1,000 Units by mouth daily.    . Diclofenac Sodium (PENNSAID) 2 % SOLN Place 2 g onto the skin 2 (two) times daily. 112 g 3  . famotidine (PEPCID) 20 MG tablet TAKE 1 TABLET BY MOUTH TWICE A DAY 180 tablet 1  . gabapentin (NEURONTIN) 100 MG capsule Take 1 capsule by mouth in the morning and 3 capsules by mouth at bedtime. 120 capsule 5  . levonorgestrel (MIRENA) 20 MCG/24HR IUD 1 each by Intrauterine route once.     [name] losartan (COZAAR) 50 MG tablet TAKE 1 TABLET BY MOUTH EVERY DAY 90 tablet 1  . meloxicam (MOBIC) 15 MG tablet Take 1 tablet (15 mg total) by mouth daily. Take with food. 30 tablet 0  . methocarbamol (ROBAXIN) 750 MG tablet Take 1 tablet (750 mg total) by mouth 4 (four) times daily. 120 tablet 2  . Multiple Vitamin (MULTIVITAMIN) tablet Take 1 tablet by mouth daily.    . Olopatadine HCl 0.2 % SOLN Place 1 drop into both eyes daily as needed (itching).     . Semaglutide (RYBELSUS) 3 MG TABS Take 3 mg by mouth daily before breakfast. (Patient not taking: Reported on 06/22/2020) 30 tablet 0  . TIADYLT ER 120 MG 24 hr capsule TAKE 1 CAPSULE BY MOUTH EVERY DAY  90 capsule 3  . UNITHROID 112 MCG tablet Take 1 tablet (112 mcg total) by mouth daily before breakfast. 90 tablet 3   No current facility-administered medications for this visit.    Family History  Problem Relation Age of Onset  . Hypertension Mother   . Cancer Mother   . Breast cancer Mother 81  . Hypertension Father   . Diabetes Father   . Cancer Father   . Ovarian cancer Maternal Aunt   . Thyroid disease Maternal Aunt        hypothyroid    Review of Systems  Exam:   There were no vitals taken for this visit.    General appearance: alert, cooperative and appears stated age Head: normocephalic, without obvious abnormality, atraumatic Neck: no adenopathy, supple, symmetrical, trachea midline and thyroid normal to inspection and palpation Lungs: clear to auscultation  bilaterally Breasts: normal appearance, no masses or tenderness, No nipple retraction or dimpling, No nipple discharge or bleeding, No axillary adenopathy Heart: regular rate and rhythm Abdomen: soft, non-tender; no masses, no organomegaly Extremities: extremities normal, atraumatic, no cyanosis or edema Skin: skin color, texture, turgor normal. No rashes or lesions Lymph nodes: cervical, supraclavicular, and axillary nodes normal. Neurologic: grossly normal  Pelvic: External genitalia:  no lesions              No abnormal inguinal nodes palpated.              Urethra:  normal appearing urethra with no masses, tenderness or lesions              Bartholins and Skenes: normal                 Vagina: normal appearing vagina with normal color and discharge, no lesions              Cervix: no lesions              Pap taken: {yes no:314532} Bimanual Exam:  Uterus:  normal size, contour, position, consistency, mobility, non-tender              Adnexa: no mass, fullness, tenderness              Rectal exam: {yes no:314532}.  Confirms.              Anus:  normal sphincter tone, no lesions  Chaperone was present for exam.  Assessment:   Well woman visit with normal exam.   Plan: Mammogram screening discussed. Self breast awareness reviewed. Pap and HR HPV as above. Guidelines for Calcium, Vitamin D, regular exercise program including cardiovascular and weight bearing exercise.   Follow up annually and prn.   Additional counseling given.  {yes [id number]. _______ minutes face to face time of which over 50% was spent in counseling.    After visit summary provided.

## 2020-07-11 ENCOUNTER — Ambulatory Visit: Payer: 59 | Admitting: Obstetrics and Gynecology

## 2020-07-22 ENCOUNTER — Ambulatory Visit: Payer: Self-pay | Admitting: Obstetrics and Gynecology

## 2020-07-27 ENCOUNTER — Other Ambulatory Visit: Payer: Self-pay | Admitting: Internal Medicine

## 2020-08-03 ENCOUNTER — Ambulatory Visit (INDEPENDENT_AMBULATORY_CARE_PROVIDER_SITE_OTHER): Payer: 59 | Admitting: Adult Health

## 2020-08-03 ENCOUNTER — Ambulatory Visit: Payer: BC Managed Care – PPO | Admitting: Gastroenterology

## 2020-08-03 ENCOUNTER — Encounter: Payer: Self-pay | Admitting: Adult Health

## 2020-08-03 VITALS — BP 138/78 | HR 92 | Ht 65.0 in | Wt 360.0 lb

## 2020-08-03 DIAGNOSIS — G4731 Primary central sleep apnea: Secondary | ICD-10-CM | POA: Diagnosis not present

## 2020-08-03 DIAGNOSIS — G4733 Obstructive sleep apnea (adult) (pediatric): Secondary | ICD-10-CM

## 2020-08-03 DIAGNOSIS — Z9989 Dependence on other enabling machines and devices: Secondary | ICD-10-CM

## 2020-08-03 NOTE — Progress Notes (Signed)
PATIENT: Erin [name] DOB: Jun 02, 1966  REASON FOR VISIT: follow up HISTORY FROM: patient  HISTORY OF PRESENT ILLNESS: Today 08/03/20:  Erin [name] is a 55 year old female with a history of obstructive sleep apnea on CPAP.  She reports that the CPAP is working well.  She denies any new issues.  She returns today for an evaluation.   Compliance Report Usage 07/04/2020 - 08/02/2020 Usage days 30/30 days (100%) >= 4 hours 30 days (100%) Average usage (days used) 8 hours 10 minutes  AirSense 10 AutoSet Serial number [id number] Mode AutoSet Min Pressure 7 cmH2O Max Pressure 14 cmH2O EPR Fulltime EPR level 2 Response Standard  Therapy Pressure - cmH2O Median: 9.6 95th percentile: 12.2 Maximum: 13.3 Leaks - L/min Median: 0.4 95th percentile: 11.8 Maximum: 37.6 Events per hour AI: 0.1 HI: 0.0 AHI: 0.1 Apnea Index Central: 0.0 Obstructive: 0.0 Unknown: 0.0  REVIEW OF SYSTEMS: Out of a complete 14 system review of symptoms, the patient complains only of the following symptoms, and all other reviewed systems are negative.  ESS 15  ALLERGIES: Allergies  Allergen Reactions  . Prednisone Other (See Comments)    Raises blood sugar so high that she receives intensive care  . Prozac [Fluoxetine Hcl] Nausea Only  . Hydrocodone Hives, Itching and Rash    On thighs   . Metformin And Related     diarrhea    HOME MEDICATIONS: Outpatient Medications Prior to Visit  Medication Sig Dispense Refill  . cholecalciferol (VITAMIN D) 1000 units tablet Take 1,000 Units by mouth daily.    . famotidine (PEPCID) 20 MG tablet TAKE 1 TABLET BY MOUTH TWICE A DAY 180 tablet 1  . gabapentin (NEURONTIN) 100 MG capsule Take 1 capsule by mouth in the morning and 3 capsules by mouth at bedtime. 120 capsule 5  . levonorgestrel (MIRENA) 20 MCG/24HR IUD 1 each by Intrauterine route once.     [name] losartan (COZAAR) 50 MG tablet TAKE 1 TABLET BY MOUTH EVERY DAY 90 tablet 1  . meloxicam (MOBIC) 15 MG  tablet Take 1 tablet (15 mg total) by mouth daily. Take with food. 30 tablet 0  . methocarbamol (ROBAXIN) 750 MG tablet Take 1 tablet (750 mg total) by mouth 4 (four) times daily. 120 tablet 2  . Multiple Vitamin (MULTIVITAMIN) tablet Take 1 tablet by mouth daily.    . Olopatadine HCl 0.2 % SOLN Place 1 drop into both eyes daily as needed (itching).     . Semaglutide (RYBELSUS) 3 MG TABS Take 3 mg by mouth daily before breakfast. 30 tablet 0  . TIADYLT ER 120 MG 24 hr capsule TAKE 1 CAPSULE BY MOUTH EVERY DAY 90 capsule 3  . UNITHROID 112 MCG tablet Take 1 tablet (112 mcg total) by mouth daily before breakfast. 90 tablet 3  . Diclofenac Sodium (PENNSAID) 2 % SOLN Place 2 g onto the skin 2 (two) times daily. 112 g 3   No facility-administered medications prior to visit.    PAST MEDICAL HISTORY: Past Medical History:  Diagnosis Date  . Abnormal uterine bleeding   . Anemia   . Depression   . Diabetes mellitus without complication (HCC)    gestational  . Elevated blood-pressure reading without diagnosis of hypertension   . Endometriosis   . Fibroids   . GERD (gastroesophageal reflux disease)   . Graves disease   . Grief at loss of child   . H/O gestational diabetes mellitus, not currently pregnant 09/28/2015  . Hypertension   .  Hypothyroidism   . Interstitial cystitis   . Menorrhagia   . Oropharyngeal candidiasis 01/16/2018  . OSA (obstructive sleep apnea), severe 06/23/2018  . Premature ventricular contractions    a. rare PVC by monitor 12/2016.  . Right ovarian cyst    3 cm.  . Thyroid disease   . Uterine leiomyoma     PAST SURGICAL HISTORY: Past Surgical History:  Procedure Laterality Date  . APPENDECTOMY    . ARTERY BIOPSY Left 10/25/2017   Procedure: BIOPSY TEMPORAL ARTERY;  Surgeon: Aviva Signs, MD;  Location: [facility];  Service: General;  Laterality: Left;  . CESAREAN SECTION    . CHOLECYSTECTOMY    . HERNIA REPAIR     incisional  . TUMOR REMOVAL  fibroids    FAMILY  HISTORY: Family History  Problem Relation Age of Onset  . Hypertension Mother   . Cancer Mother   . Breast cancer Mother 61  . Hypertension Father   . Diabetes Father   . Cancer Father   . Ovarian cancer Maternal Aunt   . Thyroid disease Maternal Aunt        hypothyroid    SOCIAL HISTORY: Social History   Socioeconomic History  . Marital status: Married    Spouse name: Not on file  . Number of children: Not on file  . Years of education: Not on file  . Highest education level: Not on file  Occupational History  . Not on file  Tobacco Use  . Smoking status: Never Smoker  . Smokeless tobacco: Never Used  Vaping Use  . Vaping Use: Never used  Substance and Sexual Activity  . Alcohol use: No  . Drug use: No  . Sexual activity: Yes    Birth control/protection: I.U.D.    Comment: Mirena inserted 2015/2016  Other Topics Concern  . Not on file  Social History Narrative  . Not on file   Social Determinants of Health   Financial Resource Strain: Not on file  Food Insecurity: Not on file  Transportation Needs: Not on file  Physical Activity: Not on file  Stress: Not on file  Social Connections: Not on file  Intimate Partner Violence: Not on file      PHYSICAL EXAM  Vitals:   08/03/20 1405  BP: 138/78  Pulse: 92  Weight: (!) 360 lb (163.3 kg)  Height: 5\' 5"  (1.651 m)   Body mass index is 59.91 kg/m.  Generalized: Well developed, in no acute distress  Chest: Lungs clear to auscultation bilaterally  Neurological examination  Mentation: Alert oriented to time, place, history taking. Follows all commands speech and language fluent Cranial nerve II-XII: Extraocular movements were full, visual field were full on confrontational test Head turning and shoulder shrug  were normal and symmetric. Motor: The motor testing reveals 5 over 5 strength of all 4 extremities. Good symmetric motor tone is noted throughout.  Sensory: Sensory testing is intact to soft touch on  all 4 extremities. No evidence of extinction is noted.  Gait and station: Gait is normal.    DIAGNOSTIC DATA (LABS, IMAGING, TESTING) - I reviewed patient records, labs, notes, testing and imaging myself where available.  Lab Results  Component Value Date   WBC 14.3 (H) 06/08/2020   HGB 11.8 (L) 06/08/2020   HCT 37.0 06/08/2020   MCV 84.1 06/08/2020   PLT 333.0 06/08/2020      Component Value Date/Time   NA 139 06/08/2020 1414   NA 140 12/05/2016 1616   K 3.9  06/08/2020 1414   CL 103 06/08/2020 1414   CO2 29 06/08/2020 1414   GLUCOSE 204 (H) 06/08/2020 1414   BUN 16 06/08/2020 1414   BUN 12 12/05/2016 1616   CREATININE 0.77 06/08/2020 1414   CREATININE 0.82 01/14/2019 0943   CALCIUM 9.5 06/08/2020 1414   PROT 7.8 06/08/2020 1414   ALBUMIN 4.1 06/08/2020 1414   AST 14 06/08/2020 1414   AST 14 (L) 01/14/2019 0943   ALT 20 06/08/2020 1414   ALT 19 01/14/2019 0943   ALKPHOS 161 (H) 06/08/2020 1414   BILITOT 0.6 06/08/2020 1414   BILITOT 0.5 01/14/2019 0943   GFRNONAA >60 01/14/2019 0943   GFRAA >60 01/14/2019 0943   Lab Results  Component Value Date   CHOL 211 (H) 06/08/2020   HDL 42.60 06/08/2020   LDLCALC 140 (H) 06/08/2020   TRIG 141.0 06/08/2020   CHOLHDL 5 06/08/2020   Lab Results  Component Value Date   HGBA1C 8.6 (H) 06/08/2020   Lab Results  Component Value Date   VITAMINB12 705 01/14/2019   Lab Results  Component Value Date   TSH 5.98 (H) 06/22/2020      ASSESSMENT AND PLAN 55 y.o. year old female  has a past medical history of Abnormal uterine bleeding, Anemia, Depression, Diabetes mellitus without complication ([place]), Elevated blood-pressure reading without diagnosis of hypertension, Endometriosis, Fibroids, GERD (gastroesophageal reflux disease), Graves disease, Grief at loss of child, H/O gestational diabetes mellitus, not currently pregnant (09/28/2015), Hypertension, Hypothyroidism, Interstitial cystitis, Menorrhagia, Oropharyngeal candidiasis  (01/16/2018), OSA (obstructive sleep apnea), severe (06/23/2018), Premature ventricular contractions, Right ovarian cyst, Thyroid disease, and Uterine leiomyoma. here with:  1. OSA on CPAP  - CPAP compliance excellent - Good treatment of AHI  - Encourage patient to use CPAP nightly and > 4 hours each night - F/U in 1 year or sooner if needed   I spent 25 minutes of face-to-face and non-face-to-face time with patient.  This included previsit chart review, lab review, study review, order entry, electronic health record documentation, patient education.  Ward Givens, MSN, NP-C 08/03/2020, 2:17 PM [facility] [address] [place], [place] [address] [phone]

## 2020-08-03 NOTE — Patient Instructions (Signed)
Continue using CPAP nightly and greater than 4 hours each night °If your symptoms worsen or you develop new symptoms please let us know.  ° °

## 2020-08-11 ENCOUNTER — Other Ambulatory Visit: Payer: Self-pay | Admitting: Ophthalmology

## 2020-08-11 DIAGNOSIS — H04123 Dry eye syndrome of bilateral lacrimal glands: Secondary | ICD-10-CM | POA: Diagnosis not present

## 2020-08-11 DIAGNOSIS — H05243 Constant exophthalmos, bilateral: Secondary | ICD-10-CM | POA: Diagnosis not present

## 2020-08-11 DIAGNOSIS — E0500 Thyrotoxicosis with diffuse goiter without thyrotoxic crisis or storm: Secondary | ICD-10-CM | POA: Diagnosis not present

## 2020-08-11 DIAGNOSIS — E119 Type 2 diabetes mellitus without complications: Secondary | ICD-10-CM | POA: Diagnosis not present

## 2020-08-11 DIAGNOSIS — H04213 Epiphora due to excess lacrimation, bilateral lacrimal glands: Secondary | ICD-10-CM | POA: Diagnosis not present

## 2020-08-12 ENCOUNTER — Other Ambulatory Visit (INDEPENDENT_AMBULATORY_CARE_PROVIDER_SITE_OTHER): Payer: 59

## 2020-08-12 ENCOUNTER — Other Ambulatory Visit: Payer: Self-pay

## 2020-08-12 ENCOUNTER — Ambulatory Visit (INDEPENDENT_AMBULATORY_CARE_PROVIDER_SITE_OTHER): Payer: 59 | Admitting: Gastroenterology

## 2020-08-12 ENCOUNTER — Encounter: Payer: Self-pay | Admitting: Gastroenterology

## 2020-08-12 VITALS — BP 140/74 | HR 92 | Ht 65.55 in | Wt 362.2 lb

## 2020-08-12 DIAGNOSIS — R110 Nausea: Secondary | ICD-10-CM | POA: Diagnosis not present

## 2020-08-12 DIAGNOSIS — K2100 Gastro-esophageal reflux disease with esophagitis, without bleeding: Secondary | ICD-10-CM | POA: Diagnosis not present

## 2020-08-12 DIAGNOSIS — D508 Other iron deficiency anemias: Secondary | ICD-10-CM | POA: Diagnosis not present

## 2020-08-12 LAB — FERRITIN: Ferritin: 68.8 ng/mL (ref 10.0–291.0)

## 2020-08-12 LAB — IRON: Iron: 43 ug/dL (ref 42–145)

## 2020-08-12 MED ORDER — FAMOTIDINE 20 MG PO TABS: 20.0000 mg | ORAL_TABLET | Freq: Two times a day (BID) | ORAL | 3 refills | Status: DC

## 2020-08-12 NOTE — Progress Notes (Signed)
Referring Provider: Binnie Rail, MD Primary Care Physician:  Binnie Rail, MD   Reason for Consultation: Nausea   IMPRESSION:  Chronic nausea controlled on famotidine Iron deficiency anemia with intolerance to oral iron BMI 60 No prior colon cancer screening No known family history of colon cancer or polyps  Nausea is thankfully controlled on famotidine. But, unclear if she still has iron deficiency. Repeat labs today. Will encourage EGD at the time of colonoscopy for colon cancer screening if her iron levels remain low.  Although she wants to proceed with colonoscopy, she was hoping to wait until later in the year.     PLAN: - Iron, ferritin - Screening colonoscopy at the hospital - Consider EGD if iron deficiency is documented - Continue famotidine 20 mg BID - Reviewed dietary and lifestyle recommendations to minimize nausea and reflux - Working to achieve a health weight may improve her nausea/reflux  HPI: Erin [name] is a 55 y.o. female seen in consultation 11/2018 for nausea. Endoscopic evaluation was recommended but not scheduled. She returns today requesting a medication refill. The interval history is obtained to the patient and review of her electronic health record. She has postablative hypothyroidism after treatment for Graves' disease, obesity, menorrhagia, endometriosis, and hypertension.   At the time of consultation 11/2018 she reported acute on chronic nausea, partially relieved by ondansetron. Famotidine 20 mg daily was started at that time and has completely controlled her symptoms. She returns today requesting a refill.  She has adjusted her eating habits and is avoiding eating late at night. Continues to have some nausea that she attributes to her CPAP. Participating in a health program [facility] that has her being thoughtful about diet and exercise.   She has a history of anemia requiring oral and IV replacement. Labs 10/24/18 show a hgb 10.6, MCV  78.5, RDW 16.9, platelets 358.  Labs 06/08/20 showed hemoglobin 11.8, MCV 84.1, RDW 14.5, platelets 333.  She is resuming oral iron today.   No prior endoscopic evaluation.   Prior abdominal imaging: - Abdominal ultrasound 01/15/2018 showed the surgically absent gallbladder and hepatic steatosis. - Abdominal CT with contrast 09/13/252: umbilical hernia.  Mother had breast cancer. No known family history of colon cancer or polyps. No family history of uterine/endometrial cancer, pancreatic cancer or gastric/stomach cancer.  Past Medical History:  Diagnosis Date  . Abnormal uterine bleeding   . Anemia   . Depression   . Diabetes mellitus without complication (HCC)    gestational  . Elevated blood-pressure reading without diagnosis of hypertension   . Endometriosis   . Fibroids   . GERD (gastroesophageal reflux disease)   . Graves disease   . Grief at loss of child   . H/O gestational diabetes mellitus, not currently pregnant 09/28/2015  . Hypertension   . Hypothyroidism   . Interstitial cystitis   . Menorrhagia   . Oropharyngeal candidiasis 01/16/2018  . OSA (obstructive sleep apnea), severe 06/23/2018  . Premature ventricular contractions    a. rare PVC by monitor 12/2016.  . Right ovarian cyst    3 cm.  . Thyroid disease   . Uterine leiomyoma     Past Surgical History:  Procedure Laterality Date  . APPENDECTOMY    . ARTERY BIOPSY Left 10/25/2017   Procedure: BIOPSY TEMPORAL ARTERY;  Surgeon: Aviva Signs, MD;  Location: [facility];  Service: General;  Laterality: Left;  . CESAREAN SECTION    . CHOLECYSTECTOMY    . HERNIA REPAIR  incisional  . TUMOR REMOVAL  fibroids    Current Outpatient Medications  Medication Sig Dispense Refill  . cholecalciferol (VITAMIN D) 1000 units tablet Take 1,000 Units by mouth daily.    [name] gabapentin (NEURONTIN) 100 MG capsule Take 1 capsule by mouth in the morning and 3 capsules by mouth at bedtime. 120 capsule 5  . levonorgestrel (MIRENA)  20 MCG/24HR IUD 1 each by Intrauterine route once.     [name] losartan (COZAAR) 50 MG tablet TAKE 1 TABLET BY MOUTH EVERY DAY 90 tablet 1  . meloxicam (MOBIC) 15 MG tablet Take 1 tablet (15 mg total) by mouth daily. Take with food. 30 tablet 0  . methocarbamol (ROBAXIN) 750 MG tablet Take 1 tablet (750 mg total) by mouth 4 (four) times daily. 120 tablet 2  . Multiple Vitamin (MULTIVITAMIN) tablet Take 1 tablet by mouth daily.    . Olopatadine HCl 0.2 % SOLN Place 1 drop into both eyes daily as needed (itching).     . Semaglutide (RYBELSUS) 3 MG TABS Take 3 mg by mouth daily before breakfast. 30 tablet 0  . TIADYLT ER 120 MG 24 hr capsule TAKE 1 CAPSULE BY MOUTH EVERY DAY 90 capsule 3  . UNITHROID 112 MCG tablet Take 1 tablet (112 mcg total) by mouth daily before breakfast. 90 tablet 3  . famotidine (PEPCID) 20 MG tablet Take 1 tablet (20 mg total) by mouth 2 (two) times daily. 180 tablet 3   No current facility-administered medications for this visit.    Allergies as of 08/12/2020 - Review Complete 08/12/2020  Allergen Reaction Noted  . Prednisone Other (See Comments) 08/16/2018  . Prozac [fluoxetine hcl] Nausea Only 11/06/2016  . Hydrocodone Hives, Itching, and Rash 06/17/2011  . Metformin and related  06/08/2020    Family History  Problem Relation Age of Onset  . Hypertension Mother   . Cancer Mother   . Breast cancer Mother 63  . Hypertension Father   . Diabetes Father   . Cancer Father   . Ovarian cancer Maternal Aunt   . Thyroid disease Maternal Aunt        hypothyroid      Physical Exam: Vital signs were reviewed. General:   Alert, well-nourished, pleasant and cooperative in NAD.  No pallor. Head:  Normocephalic and atraumatic.  No alopecia. Eyes:  Sclera clear, no icterus.   Conjunctiva pink. Abdomen:  Soft, central obesity, nontender, normal bowel sounds. No rebound or guarding. No hepatosplenomegaly Rectal:  Deferred  Msk:  Symmetrical without gross  deformities. Extremities:  No gross deformities or edema. Neurologic:  Alert and  oriented x4;  grossly nonfocal Skin:  No rash or bruise. Psych:  Alert and cooperative. Normal mood and affect.   Keaton Stirewalt L. Tarri Glenn, MD, MPH [facility] 08/12/2020, 4:21 PM

## 2020-08-12 NOTE — Patient Instructions (Addendum)
RECOMMENDATION(S):   I am recommending a screening colonoscopy and labs to better evaluate your symptoms. Depending on the results of your labs, we may need to consider an endoscopy. In the meantime, I will send in a refill of your Famotidine.  LABS:   . Your provider has requested that you go to the basement level for lab work before leaving today. Press "B" on the elevator. The lab is located at the first door on the left as you exit the elevator.  HEALTHCARE LAWS AND MY CHART RESULTS:   . Due to recent changes in healthcare laws, you may see the results of your imaging and laboratory studies on MyChart before your provider has had a chance to review them.  We understand that in some cases there may be results that are confusing or concerning to you. Not all laboratory results come back in the same time frame and the provider may be waiting for multiple results in order to interpret others.  Please give [place] 48 hours in order for your provider to thoroughly review all the results before contacting the office for clarification of your results.   SCREENING COLONOSCOPY:  . Per your request, we did not schedule the screening colonoscopy today. When you are ready to proceed with scheduling, please contact our office at [phone].   NOTE:   . At the time of scheduling your screening colonoscopy at the hospital, you will also be scheduled for a pre-visit with our nursing staff. During this appointment, you will be provided with your prep instructions and hospital instructions.  PRESCRIPTION MEDICATION(S):   We have sent the following medication(s) to your pharmacy:  . Famotidine - please continue to take 20mg  by mouth 2 times daily  NOTE: If your medication(s) requires a PRIOR AUTHORIZATION, we will receive notification from your pharmacy. Once received, the process to submit for approval may take up to 7-10 business days. You will be contacted about any denials we have received from your  insurance company as well as alternatives recommended by your provider.  BMI:  If you are age 54 or younger, your body mass index should be between 19-25. Your Body mass index is 59.27 kg/m. If this is out of the aformentioned range listed, please consider follow up with your Primary Care Provider.   Thank you for trusting me with your gastrointestinal care!    Thornton Park, MD, MPH

## 2020-08-16 ENCOUNTER — Ambulatory Visit (INDEPENDENT_AMBULATORY_CARE_PROVIDER_SITE_OTHER): Payer: 59 | Admitting: Nurse Practitioner

## 2020-08-16 ENCOUNTER — Other Ambulatory Visit: Payer: Self-pay

## 2020-08-16 ENCOUNTER — Encounter: Payer: Self-pay | Admitting: Nurse Practitioner

## 2020-08-16 VITALS — BP 112/70 | HR 68 | Wt 363.0 lb

## 2020-08-16 DIAGNOSIS — R1030 Lower abdominal pain, unspecified: Secondary | ICD-10-CM | POA: Diagnosis not present

## 2020-08-16 NOTE — Progress Notes (Signed)
   Acute Office Visit  Subjective:    Patient ID: Erin [name], female    DOB: 01-Aug-1965, 55 y.o.   MRN: [id number]   HPI 55 y.o. presents today for abdominal discomfort that started 3-4 weeks ago. She describes the pain as lower and midline, constant, pain level 5/10, like something is "poking", worse with sitting and lying positions, better with standing. Ultrasound 10/2018 showed right cyst that had slightly increased in size from 30 mm in November to 2019 to 33 x 35 mm and questionable left hydrosaplinx. Mirena IUD inserted 2015/2016 for cycle regulation. She plans to discuss this with Dr. Quincy Simmonds at annual visit. Was seen by GI 08/12/2020 with recommendations for colonoscopy. No changes in bowel movements. She has a history of IC and says she avoids tea and this helps but she recently did a detox green tea for weight loss.    Review of Systems  Constitutional: Negative.   Gastrointestinal: Positive for abdominal pain. Negative for constipation, diarrhea, nausea and vomiting.  Genitourinary: Negative.        Objective:    Physical Exam Constitutional:      Appearance: Normal appearance.  Abdominal:     Tenderness: There is abdominal tenderness in the suprapubic area. There is no guarding or rebound.  Genitourinary:    General: Normal vulva.     Vagina: Normal.     Comments: Unable to visualize cervix/IUD position    BP 112/70 (BP Location: Right Arm, Patient Position: Sitting, Cuff Size: Large)   Pulse 68   Wt (!) 363 lb (164.7 kg)   BMI 59.40 kg/m  Wt Readings from Last 3 Encounters:  08/16/20 (!) 363 lb (164.7 kg)  08/12/20 (!) 362 lb 4 oz (164.3 kg)  08/03/20 (!) 360 lb (163.3 kg)        Assessment & Plan:   Problem List Items Addressed This Visit   None   Visit Diagnoses    Lower abdominal pain    -  Primary   Relevant Orders   US PELVIS TRANSVAGINAL NON-OB (TV ONLY)     Plan: We will schedule pelvic ultrasound to re-evaluate ovarian cyst and left  hydrosalpinx. Pain is more centrally located but due to history and persistent symptoms we will rule out any uterine or ovarian abnormalities as well as check IUD placement since cervix was not visualized during exam. She also plans to schedule appointment with Dr. Quincy Simmonds to discuss IUD removal and next steps as she is overdue for annual exam. She is agreeable to plan.      [place], 3:13 PM 08/16/2020

## 2020-08-25 ENCOUNTER — Ambulatory Visit
Admission: RE | Admit: 2020-08-25 | Discharge: 2020-08-25 | Disposition: A | Discharge status: Home / Self Care | Payer: 59 | Source: Ambulatory Visit | Attending: Ophthalmology | Admitting: Ophthalmology

## 2020-08-25 ENCOUNTER — Encounter: Payer: Self-pay | Admitting: Internal Medicine

## 2020-08-25 DIAGNOSIS — H05243 Constant exophthalmos, bilateral: Secondary | ICD-10-CM

## 2020-08-25 DIAGNOSIS — E119 Type 2 diabetes mellitus without complications: Secondary | ICD-10-CM

## 2020-08-29 ENCOUNTER — Telehealth: Payer: Self-pay

## 2020-08-29 NOTE — Telephone Encounter (Signed)
Spoke with patient regarding benefits for scheduled Ultrasound. Patient acknowledges understanding of information presented.   Patient wishes to cancel ultrasound appointment at this time and requests a call back to reschedule in a few weeks.  Routing to Marny Lowenstein, NP and Josefa Half, MD as Juluis Rainier. Will follow up with patient in a few weeks to reschedule.  Encounter closed.

## 2020-09-15 ENCOUNTER — Other Ambulatory Visit: Payer: 59

## 2020-09-15 ENCOUNTER — Other Ambulatory Visit: Payer: 59 | Admitting: Obstetrics and Gynecology

## 2020-09-27 ENCOUNTER — Encounter: Payer: Self-pay | Admitting: Internal Medicine

## 2020-09-27 DIAGNOSIS — M25552 Pain in left hip: Secondary | ICD-10-CM

## 2020-09-29 ENCOUNTER — Other Ambulatory Visit: Payer: Self-pay | Admitting: Internal Medicine

## 2020-10-04 ENCOUNTER — Encounter: Payer: Self-pay | Admitting: Orthopaedic Surgery

## 2020-10-04 ENCOUNTER — Other Ambulatory Visit: Payer: Self-pay

## 2020-10-04 ENCOUNTER — Ambulatory Visit (INDEPENDENT_AMBULATORY_CARE_PROVIDER_SITE_OTHER): Payer: 59

## 2020-10-04 ENCOUNTER — Ambulatory Visit (INDEPENDENT_AMBULATORY_CARE_PROVIDER_SITE_OTHER): Payer: 59 | Admitting: Orthopaedic Surgery

## 2020-10-04 VITALS — Ht 65.55 in | Wt 363.0 lb

## 2020-10-04 DIAGNOSIS — Z6841 Body Mass Index (BMI) 40.0 and over, adult: Secondary | ICD-10-CM | POA: Diagnosis not present

## 2020-10-04 DIAGNOSIS — G8929 Other chronic pain: Secondary | ICD-10-CM | POA: Insufficient documentation

## 2020-10-04 DIAGNOSIS — M25552 Pain in left hip: Secondary | ICD-10-CM

## 2020-10-04 MED ORDER — DICLOFENAC SODIUM 75 MG PO TBEC
75.0000 mg | DELAYED_RELEASE_TABLET | Freq: Two times a day (BID) | ORAL | 2 refills | Status: DC
Start: 2020-10-04 — End: 2020-11-08

## 2020-10-04 NOTE — Progress Notes (Signed)
Office Visit Note   Patient: Erin [name]           Date of Birth: 1966/01/05           MRN: [id number] Visit Date: 10/04/2020              Requested by: Binnie Rail, MD [place],  [place] [address] PCP: Binnie Rail, MD   Assessment & Plan: Visit Diagnoses:  1. Chronic left hip pain   2. Body mass index 50.0-59.9, adult ([place])   3. Morbid obesity ([place])     Plan: Based on findings my impression is symptomatic left hip DJD.  Nonsurgical treatment options were explained at length and most importantly significant weight loss to help lessen the pain and preserve degenerative wear and tear which will ultimately result in a hip replacement.  We will try diclofenac to see if this will work better than meloxicam.  Currently she is not interested in physical therapy or cortisone injections.  Follow-up as needed.  Follow-Up Instructions: Return if symptoms worsen or fail to improve.   Orders:  Orders Placed This Encounter  Procedures  . XR HIP UNILAT W OR W/O PELVIS 2-3 VIEWS LEFT   Meds ordered this encounter  Medications  . diclofenac (VOLTAREN) 75 MG EC tablet    Sig: Take 1 tablet (75 mg total) by mouth 2 (two) times daily.    Dispense:  30 tablet    Refill:  2      Procedures: No procedures performed   Clinical Data: No additional findings.   Subjective: Chief Complaint  Patient presents with  . Left Hip - Pain    Erin [name] is a very pleasant 55 year old female who comes in for evaluation of left hip and groin pain since the end of March with recent worsening.  She denies any falls or injuries or previous surgeries.  She has increased pain when she is walking standing feels like there is a catch in the groin.  Denies any radicular symptoms.  She works in Furniture conservator/restorer.  She currently takes meloxicam and methocarbamol for the pain.     Review of Systems  Constitutional: Negative.   HENT: Negative.   Eyes: Negative.    Respiratory: Negative.   Cardiovascular: Negative.   Endocrine: Negative.   Musculoskeletal: Negative.   Neurological: Negative.   Hematological: Negative.   Psychiatric/Behavioral: Negative.   All other systems reviewed and are negative.    Objective: Vital Signs: Ht 5' 5.55" (1.665 m)   Wt (!) 363 lb (164.7 kg)   BMI 59.40 kg/m   Physical Exam Vitals and nursing note reviewed.  Constitutional:      Appearance: She is well-developed.  HENT:     Head: Normocephalic and atraumatic.  Pulmonary:     Effort: Pulmonary effort is normal.  Abdominal:     Palpations: Abdomen is soft.  Musculoskeletal:     Cervical back: Neck supple.  Skin:    General: Skin is warm.     Capillary Refill: Capillary refill takes less than 2 seconds.  Neurological:     Mental Status: She is alert and oriented to person, place, and time.  Psychiatric:        Behavior: Behavior normal.        Thought Content: Thought content normal.        Judgment: Judgment normal.     Ortho Exam Left hip shows moderate pain with internal and external rotation positive  FADIR sign.  Negative logroll.  Negative sciatic tension signs.  Positive [place]inchfield.  Lateral hip is nontender.  Lumbar spine is mildly tender. Specialty Comments:  No specialty comments available.  Imaging: XR HIP UNILAT W OR W/O PELVIS 2-3 VIEWS LEFT  Result Date: 10/04/2020 Soft tissue shadow obscures clarity of bony structures.  Moderate left hip degenerative changes.    PMFS History: Patient Active Problem List   Diagnosis Date Noted  . Chronic left hip pain 10/04/2020  . Spasm of muscle of lower back 06/08/2020  . Perimenopausal symptoms 09/16/2019  . De Quervain's tenosynovitis, right 06/16/2019  . Patellofemoral arthritis of left knee 11/28/2018  . Left knee pain 11/24/2018  . Episodic lightheadedness 10/24/2018  . Metrorrhagia 10/24/2018  . Lumbar back pain with radiculopathy affecting left lower extremity 10/07/2018  .  Right ovarian cyst   . OSA (obstructive sleep apnea), severe 06/23/2018  . Anxiety 03/05/2018  . Chronic nonintractable headache 03/05/2018  . Hyperlipidemia 01/23/2018  . Elevated liver enzymes   . Diabetes mellitus type 2, uncomplicated ([place]) 85/07/7739  . DKA (diabetic ketoacidoses) 01/15/2018  . Gastroesophageal reflux disease 10/05/2017  . Temporal arteritis ([place]) 10/03/2017  . Right foot pain 07/15/2017  . Grief at loss of child 11/02/2016  . Hypertension 09/28/2015  . Morbid obesity ([place]) 05/17/2015  . Umbilical hernia 28/78/6767  . Chronic lower back pain 05/17/2015  . Palpitations 05/17/2015  . Iron deficiency anemia 09/03/2007  . Hypothyroidism following radioiodine therapy 09/02/2007  . INTERSTITIAL CYSTITIS 09/01/2007  . ALLERGIC RHINITIS 08/07/2007  . [place] DISEASE, LUMBAR 08/07/2007   Past Medical History:  Diagnosis Date  . Abnormal uterine bleeding   . Anemia   . Depression   . Diabetes mellitus without complication (HCC)    gestational  . Elevated blood-pressure reading without diagnosis of hypertension   . Endometriosis   . Fibroids   . GERD (gastroesophageal reflux disease)   . Graves disease   . Grief at loss of child   . H/O gestational diabetes mellitus, not currently pregnant 09/28/2015  . Hypertension   . Hypothyroidism   . Interstitial cystitis   . Menorrhagia   . Oropharyngeal candidiasis 01/16/2018  . OSA (obstructive sleep apnea), severe 06/23/2018  . Premature ventricular contractions    a. rare PVC by monitor 12/2016.  . Right ovarian cyst    3 cm.  . Thyroid disease   . Uterine leiomyoma     Family History  Problem Relation Age of Onset  . Hypertension Mother   . Cancer Mother   . Breast cancer Mother 19  . Hypertension Father   . Diabetes Father   . Cancer Father   . Ovarian cancer Maternal Aunt   . Thyroid disease Maternal Aunt        hypothyroid    Past Surgical History:  Procedure Laterality Date  . APPENDECTOMY    .  ARTERY BIOPSY Left 10/25/2017   Procedure: BIOPSY TEMPORAL ARTERY;  Surgeon: Aviva Signs, MD;  Location: [facility];  Service: General;  Laterality: Left;  . CESAREAN SECTION    . CHOLECYSTECTOMY    . HERNIA REPAIR     incisional  . TUMOR REMOVAL  fibroids   Social History   Occupational History  . Not on file  Tobacco Use  . Smoking status: Never Smoker  . Smokeless tobacco: Never Used  Vaping Use  . Vaping Use: Never used  Substance and Sexual Activity  . Alcohol use: No  . Drug use: No  .  Sexual activity: Yes    Birth control/protection: I.U.D.    Comment: Mirena inserted 2015/2016

## 2020-10-05 ENCOUNTER — Ambulatory Visit (INDEPENDENT_AMBULATORY_CARE_PROVIDER_SITE_OTHER): Payer: 59 | Admitting: Obstetrics and Gynecology

## 2020-10-05 ENCOUNTER — Ambulatory Visit: Payer: 59 | Admitting: Obstetrics and Gynecology

## 2020-10-05 ENCOUNTER — Telehealth: Payer: Self-pay | Admitting: Internal Medicine

## 2020-10-05 ENCOUNTER — Encounter: Payer: Self-pay | Admitting: Obstetrics and Gynecology

## 2020-10-05 VITALS — BP 130/80 | Ht 65.0 in | Wt 363.0 lb

## 2020-10-05 DIAGNOSIS — Z01419 Encounter for gynecological examination (general) (routine) without abnormal findings: Secondary | ICD-10-CM

## 2020-10-05 DIAGNOSIS — T8332XA Displacement of intrauterine contraceptive device, initial encounter: Secondary | ICD-10-CM

## 2020-10-05 DIAGNOSIS — R1030 Lower abdominal pain, unspecified: Secondary | ICD-10-CM

## 2020-10-05 DIAGNOSIS — N951 Menopausal and female climacteric states: Secondary | ICD-10-CM

## 2020-10-05 DIAGNOSIS — B354 Tinea corporis: Secondary | ICD-10-CM | POA: Diagnosis not present

## 2020-10-05 LAB — FOLLICLE STIMULATING HORMONE: FSH: 30.1 m[IU]/mL

## 2020-10-05 LAB — ESTRADIOL: Estradiol: <15 pg/mL

## 2020-10-05 MED ORDER — CLOTRIMAZOLE 1 % EX CREA: 1.0000 "application " | TOPICAL_CREAM | Freq: Two times a day (BID) | CUTANEOUS | 2 refills | Status: AC

## 2020-10-05 NOTE — Telephone Encounter (Signed)
Patient cant find the rybelsus discount card Dr. Quay Burow gave her so she was wondering if she could get another one

## 2020-10-05 NOTE — Progress Notes (Addendum)
55 y.o. G69P0 Married [place] American female here for annual exam and lower abdominal pain. Has Mirena IUD placed in 2015 or 2016. History of interstitial cystitis.  Nov 13, 2018, right ovarian cyst 33 x 35 mm with scattered echoes and left hydrosalpinx noted on pelvic ultrasound.  Has not had follow up to date.  No menses in over one year.  Having hot flashes.   Has elevated A1C.  Due to start a new medication.   Asking if the masses under her arms will go away.   Due for her Covid booster.   PCP:   Billey Gosling, MD  No LMP recorded (lmp unknown). (Menstrual status: IUD).           Sexually active: Yes.    The current method of family planning is IUD.   Mirena IUD placed 2015/2016. Exercising: No.  n/a Smoker:  no  Health Maintenance: Pap:  05/02/2018 - normal, negative HR HPV History of abnormal Pap:  no MMG:  2017.   Colonoscopy:  Never had one BMD:   n/a TDaP:  2006 Gardasil:  n/a HIV:  --- Hep C: --- Screening Labs:  PCP.    reports that she has never smoked. She has never used smokeless tobacco. She reports that she does not drink alcohol and does not use drugs.  Past Medical History:  Diagnosis Date  . Abnormal uterine bleeding   . Anemia   . COVID 12/2018  . Depression   . Diabetes mellitus without complication (HCC)    gestational  . Elevated blood-pressure reading without diagnosis of hypertension   . Endometriosis   . Fibroids   . GERD (gastroesophageal reflux disease)   . Graves disease   . Grief at loss of child   . H/O gestational diabetes mellitus, not currently pregnant 09/28/2015  . Hypertension   . Hypothyroidism   . Interstitial cystitis   . Menorrhagia   . Oropharyngeal candidiasis 01/16/2018  . OSA (obstructive sleep apnea), severe 06/23/2018  . Premature ventricular contractions    a. rare PVC by monitor 12/2016.  . Right ovarian cyst    3 cm.  . Thyroid disease   . Uterine leiomyoma     Past Surgical History:  Procedure Laterality  Date  . APPENDECTOMY    . ARTERY BIOPSY Left 10/25/2017   Procedure: BIOPSY TEMPORAL ARTERY;  Surgeon: Aviva Signs, MD;  Location: [facility];  Service: General;  Laterality: Left;  . CESAREAN SECTION    . CHOLECYSTECTOMY    . HERNIA REPAIR     incisional  . TUMOR REMOVAL  fibroids    Current Outpatient Medications  Medication Sig Dispense Refill  . cholecalciferol (VITAMIN D) 1000 units tablet Take 1,000 Units by mouth daily.    . diclofenac (VOLTAREN) 75 MG EC tablet Take 1 tablet (75 mg total) by mouth 2 (two) times daily. 30 tablet 2  . famotidine (PEPCID) 20 MG tablet Take 1 tablet (20 mg total) by mouth 2 (two) times daily. 180 tablet 3  . gabapentin (NEURONTIN) 100 MG capsule Take 1 capsule by mouth in the morning and 3 capsules by mouth at bedtime. 120 capsule 5  . levonorgestrel (MIRENA) 20 MCG/24HR IUD 1 each by Intrauterine route once.     [name] losartan (COZAAR) 50 MG tablet TAKE 1 TABLET BY MOUTH EVERY DAY 90 tablet 1  . meloxicam (MOBIC) 15 MG tablet TAKE 1 TABLET BY MOUTH EVERY DAY WITH FOOD 30 tablet 0  . methocarbamol (ROBAXIN) 750 MG tablet  Take 1 tablet (750 mg total) by mouth 4 (four) times daily. 120 tablet 2  . Multiple Vitamin (MULTIVITAMIN) tablet Take 1 tablet by mouth daily.    . Semaglutide (RYBELSUS) 3 MG TABS Take 3 mg by mouth daily before breakfast. 30 tablet 0  . TIADYLT ER 120 MG 24 hr capsule TAKE 1 CAPSULE BY MOUTH EVERY DAY 90 capsule 3  . UNITHROID 112 MCG tablet Take 1 tablet (112 mcg total) by mouth daily before breakfast. 90 tablet 3   No current facility-administered medications for this visit.    Family History  Problem Relation Age of Onset  . Hypertension Mother   . Cancer Mother   . Breast cancer Mother 17  . Hypertension Father   . Diabetes Father   . Cancer Father   . Ovarian cancer Maternal Aunt   . Thyroid disease Maternal Aunt        hypothyroid    Review of Systems  See HPI.  Exam:   BP 130/80 (BP Location: Right Arm, Patient  Position: Sitting, Cuff Size: Large)   Ht 5\' 5"  (1.651 m)   Wt (!) 363 lb (164.7 kg)   LMP  (LMP Unknown)   BMI 60.41 kg/m     General appearance: alert, cooperative and appears stated age Head: normocephalic, without obvious abnormality, atraumatic Neck: no adenopathy, supple, symmetrical, trachea midline and thyroid normal to inspection and palpation Lungs: clear to auscultation bilaterally Breasts: normal appearance, no masses or tenderness, No nipple retraction or dimpling, No nipple discharge or bleeding, bilateral axillary regions with 5 cm areas of enlargement of tissue.  Right axillary region with significant firmness. Heart: regular rate and rhythm Abdomen: soft, non-tender; no masses, no organomegaly Extremities: extremities normal, atraumatic, no cyanosis or edema Skin:  Grayish plaques of skin in inguinal region and under panus.  Lymph nodes: cervical, supraclavicular, and axillary nodes normal. Neurologic: grossly normal  Pelvic: External genitalia:  no lesions              No abnormal inguinal nodes palpated.              Urethra:  normal appearing urethra with no masses, tenderness or lesions              Bartholins and Skenes: normal                 Vagina: normal appearing vagina with normal color and discharge, no lesions              Cervix: no lesions.  IUD strings not seen.               Pap taken: No. Bimanual Exam:  Uterus:  normal size, contour, position, consistency, mobility, non-tender              Adnexa: no mass, fullness, tenderness              Rectal exam: Yes.  .  Confirms.              Anus:  normal sphincter tone, no lesions  Chaperone was present for exam.  Assessment:   Well woman visit with gynecologic exam. Status post BTL.  Loss of 1 twin at 6 months and a daughter at 45 yo.  Mirena IUD.  May be expiring.  IUD threads not seen.  Lower abdominal pain.   Hx right ovarian cyst and left hydrosalpinx. Hx endometriosis and fibroids.  Status  post myomectomy.  Interstitial cystitis.   HTN.  DM.  FH premenopausal breast cancer of mother. Status post herniorrhaphy with mesh placement.  Bilateral axillary masses with firmness to the right axillary mass. Tinea corporis.  Plan: Dx bilateral mammogram and bilateral breast/axillary [place].  Self breast awareness reviewed. Pap and HR HPV as above. Guidelines for Calcium, Vitamin D, regular exercise program including cardiovascular and weight bearing exercise. Return for pelvic US.  Check FSH and estradiol.  Clotrimazole cream.  Good blood sugar control encouraged. Follow up annually and prn.

## 2020-10-05 NOTE — Telephone Encounter (Signed)
Message left for patient.  No discount cards at this time but reaching out to rep to see about bringing some in.

## 2020-10-06 NOTE — Telephone Encounter (Signed)
Sent patient a mychart message.

## 2020-10-07 ENCOUNTER — Telehealth: Payer: Self-pay | Admitting: Obstetrics and Gynecology

## 2020-10-07 DIAGNOSIS — R2233 Localized swelling, mass and lump, upper limb, bilateral: Secondary | ICD-10-CM

## 2020-10-07 NOTE — Telephone Encounter (Signed)
Patient also needs a pelvic ultrasound and follow up with me.  Order has already been placed.  Thank you.

## 2020-10-07 NOTE — Telephone Encounter (Signed)
Please schedule a bilateral diagnostic mammogram and bilateral breast/axllary ultrasound at [facility].  The patient has bilateral axillary masses, each about 5 cm in size.  The right axillary mass is firm.

## 2020-10-10 NOTE — Telephone Encounter (Signed)
Staff message sent to Butch Penny to schedule pelvic u/s please.

## 2020-10-12 NOTE — Telephone Encounter (Signed)
Orders placed at the breast center of [facility], next available appointment on 11/16/20 @ 3:00pm.  Left message for patient to call.

## 2020-10-14 NOTE — Telephone Encounter (Signed)
Patient informed with breast center info. Aware she needs to schedule U/S as well,said she will call to schedule.

## 2020-10-19 ENCOUNTER — Encounter: Payer: Self-pay | Admitting: Internal Medicine

## 2020-10-19 ENCOUNTER — Telehealth: Payer: Self-pay | Admitting: Internal Medicine

## 2020-10-19 NOTE — Telephone Encounter (Signed)
Reports every medication has possible side effects.  I do feel that the Rybelsus is better tolerated and less likely to cause side effects.  Obviously, if she has side effects she just has to stop the medication and we would consider something different.  If she has side effects from the medication it is more likely to be GI related, but that is not uncommon for a lot of his medications.  Nausea, diarrhea, constipation are all possible.

## 2020-10-19 NOTE — Telephone Encounter (Signed)
Spoke with patient today. She will try Rybelsus and see how she tolerates it. I did give her a sample to try to see how she tolerates it.

## 2020-10-19 NOTE — Telephone Encounter (Addendum)
Looks like she was trying to get a discount card for Rybelsus-has that happen.  She really needs to be on that medication because her sugars have not been controlled.  Can you follow-up with her regarding this, if not we need to consider different options.   Please have her schedule a f/u as well - we need to get these sugars controlled.

## 2020-10-20 ENCOUNTER — Telehealth: Payer: Self-pay | Admitting: Internal Medicine

## 2020-10-20 DIAGNOSIS — E1165 Type 2 diabetes mellitus with hyperglycemia: Secondary | ICD-10-CM

## 2020-10-20 MED ORDER — BLOOD GLUCOSE MONITOR KIT: PACK | 0 refills | Status: DC

## 2020-10-20 NOTE — Telephone Encounter (Signed)
Patient called requesting an order for a new glucose meter and testing kit supplies. It can be sent to [id number]- [place], [place] - [address] Please advise

## 2020-10-22 ENCOUNTER — Encounter: Payer: Self-pay | Admitting: Internal Medicine

## 2020-10-25 MED ORDER — ACCU-CHEK GUIDE VI STRP: ORAL_STRIP | 12 refills | Status: DC

## 2020-10-25 NOTE — Addendum Note (Signed)
Addended by: Binnie Rail on: 10/25/2020 09:46 AM   Modules accepted: Orders

## 2020-10-26 ENCOUNTER — Other Ambulatory Visit: Payer: Self-pay | Admitting: Internal Medicine

## 2020-11-01 DIAGNOSIS — G4733 Obstructive sleep apnea (adult) (pediatric): Secondary | ICD-10-CM | POA: Diagnosis not present

## 2020-11-01 DIAGNOSIS — G4731 Primary central sleep apnea: Secondary | ICD-10-CM | POA: Diagnosis not present

## 2020-11-02 MED ORDER — ACCU-CHEK SOFTCLIX LANCETS MISC: 12 refills | Status: DC

## 2020-11-02 NOTE — Addendum Note (Signed)
Addended by: Binnie Rail on: 11/02/2020 05:14 AM   Modules accepted: Orders

## 2020-11-06 ENCOUNTER — Other Ambulatory Visit: Payer: Self-pay | Admitting: Internal Medicine

## 2020-11-07 ENCOUNTER — Telehealth: Payer: Self-pay | Admitting: Internal Medicine

## 2020-11-07 NOTE — Telephone Encounter (Signed)
meloxicam (MOBIC) 15 MG tablet  [id number] - [place], [place] - [address] AT [address] Phone:  [phone]  Fax:  [phone]     Last seen- 12.29.21 Next apt- n/a

## 2020-11-07 NOTE — Telephone Encounter (Signed)
Needs a f/u appt with me.    Is she still taking diclofenac -- can not take both.

## 2020-11-08 MED ORDER — MELOXICAM 15 MG PO TABS: 15.0000 mg | ORAL_TABLET | Freq: Every day | ORAL | 3 refills | Status: DC

## 2020-11-08 NOTE — Addendum Note (Signed)
Addended by: Binnie Rail on: 11/08/2020 03:53 PM   Modules accepted: Orders

## 2020-11-10 NOTE — Progress Notes (Signed)
Subjective:    Patient ID: Erin [name], female    DOB: [birth year], 55 y.o.   MRN: [id number]  HPI The patient is here for follow up of their chronic medical problems, including DM, hypothyroid, lumbar radiculopathy   Start crestor 5 mg     Medications and allergies reviewed with patient and updated if appropriate.  Patient Active Problem List   Diagnosis Date Noted  . Chronic left hip pain 10/04/2020  . Spasm of muscle of lower back 06/08/2020  . Perimenopausal symptoms 09/16/2019  . De Quervain's tenosynovitis, right 06/16/2019  . Patellofemoral arthritis of left knee 11/28/2018  . Left knee pain 11/24/2018  . Episodic lightheadedness 10/24/2018  . Metrorrhagia 10/24/2018  . Lumbar back pain with radiculopathy affecting left lower extremity 10/07/2018  . Right ovarian cyst   . OSA (obstructive sleep apnea), severe 06/23/2018  . Anxiety 03/05/2018  . Chronic nonintractable headache 03/05/2018  . Hyperlipidemia 01/23/2018  . Elevated liver enzymes   . Diabetes ([place]) 01/16/2018  . DKA (diabetic ketoacidoses) 01/15/2018  . Gastroesophageal reflux disease 10/05/2017  . Temporal arteritis ([place]) 10/03/2017  . Right foot pain 07/15/2017  . Grief at loss of child 11/02/2016  . Hypertension 09/28/2015  . Morbid obesity ([place]) 05/17/2015  . Umbilical hernia 25/42/7062  . Chronic lower back pain 05/17/2015  . Palpitations 05/17/2015  . Iron deficiency anemia 09/03/2007  . Hypothyroidism following radioiodine therapy 09/02/2007  . INTERSTITIAL CYSTITIS 09/01/2007  . ALLERGIC RHINITIS 08/07/2007  . [place] DISEASE, LUMBAR 08/07/2007    Current Outpatient Medications on File Prior to Visit  Medication Sig Dispense Refill  . Accu-Chek Softclix Lancets lancets Use as instructed to check sugars. Dx E11.65 100 each 12  . blood glucose meter kit and supplies KIT Dispense based on patient and insurance preference. Use up to four times daily as directed. (FOR E11.65). 1 each 0  .  cholecalciferol (VITAMIN D) 1000 units tablet Take 1,000 Units by mouth daily.    . clotrimazole (LOTRIMIN) 1 % cream Apply 1 application topically 2 (two) times daily. Use for 2 weeks at a time as needed. 30 g 2  . famotidine (PEPCID) 20 MG tablet Take 1 tablet (20 mg total) by mouth 2 (two) times daily. 180 tablet 3  . gabapentin (NEURONTIN) 100 MG capsule TAKE 1 CAPSULE BY MOUTH IN THE MORNING AND 3 CAPSULES BY MOUTH AT BEDTIME. 120 capsule 5  . glucose blood (ACCU-CHEK GUIDE) test strip Use as instructed to test sugars up to 4 times a day.  Dx E11.65 100 each 12  . levonorgestrel (MIRENA) 20 MCG/24HR IUD 1 each by Intrauterine route once.     [name] losartan (COZAAR) 50 MG tablet TAKE 1 TABLET BY MOUTH EVERY DAY 90 tablet 1  . meloxicam (MOBIC) 15 MG tablet Take 1 tablet (15 mg total) by mouth daily. 30 tablet 3  . methocarbamol (ROBAXIN) 750 MG tablet Take 1 tablet (750 mg total) by mouth 4 (four) times daily. 120 tablet 2  . Multiple Vitamin (MULTIVITAMIN) tablet Take 1 tablet by mouth daily.    . Semaglutide (RYBELSUS) 3 MG TABS Take 3 mg by mouth daily before breakfast. 30 tablet 0  . TIADYLT ER 120 MG 24 hr capsule TAKE 1 CAPSULE BY MOUTH EVERY DAY 90 capsule 3  . UNITHROID 112 MCG tablet Take 1 tablet (112 mcg total) by mouth daily before breakfast. 90 tablet 3   No current facility-administered medications on file prior to visit.    Past  Medical History:  Diagnosis Date  . Abnormal uterine bleeding   . Anemia   . COVID 12/2018  . Depression   . Diabetes mellitus without complication (HCC)    gestational  . Elevated blood-pressure reading without diagnosis of hypertension   . Endometriosis   . Fibroids   . GERD (gastroesophageal reflux disease)   . Graves disease   . Grief at loss of child   . H/O gestational diabetes mellitus, not currently pregnant 09/28/2015  . Hypertension   . Hypothyroidism   . Interstitial cystitis   . Menorrhagia   . Oropharyngeal candidiasis 01/16/2018   . OSA (obstructive sleep apnea), severe 06/23/2018  . Premature ventricular contractions    a. rare PVC by monitor 12/2016.  . Right ovarian cyst    3 cm.  . Thyroid disease   . Uterine leiomyoma     Past Surgical History:  Procedure Laterality Date  . APPENDECTOMY    . ARTERY BIOPSY Left 10/25/2017   Procedure: BIOPSY TEMPORAL ARTERY;  Surgeon: Aviva Signs, MD;  Location: [facility];  Service: General;  Laterality: Left;  . CESAREAN SECTION    . CHOLECYSTECTOMY    . HERNIA REPAIR     incisional  . TUMOR REMOVAL  fibroids    Social History   Socioeconomic History  . Marital status: Married    Spouse name: Not on file  . Number of children: Not on file  . Years of education: Not on file  . Highest education level: Not on file  Occupational History  . Not on file  Tobacco Use  . Smoking status: Never Smoker  . Smokeless tobacco: Never Used  Vaping Use  . Vaping Use: Never used  Substance and Sexual Activity  . Alcohol use: No  . Drug use: No  . Sexual activity: Never    Birth control/protection: I.U.D.    Comment: Mirena inserted 2015/2016  Other Topics Concern  . Not on file  Social History Narrative  . Not on file   Social Determinants of Health   Financial Resource Strain: Not on file  Food Insecurity: Not on file  Transportation Needs: Not on file  Physical Activity: Not on file  Stress: Not on file  Social Connections: Not on file    Family History  Problem Relation Age of Onset  . Hypertension Mother   . Cancer Mother   . Breast cancer Mother 43  . Hypertension Father   . Diabetes Father   . Cancer Father   . Ovarian cancer Maternal Aunt   . Thyroid disease Maternal Aunt        hypothyroid    Review of Systems     Objective:  There were no vitals filed for this visit. BP Readings from Last 3 Encounters:  10/05/20 130/80  08/16/20 112/70  08/12/20 140/74   Wt Readings from Last 3 Encounters:  10/05/20 (!) 363 lb (164.7 kg)  10/04/20  (!) 363 lb (164.7 kg)  08/16/20 (!) 363 lb (164.7 kg)   There is no height or weight on file to calculate BMI.   Physical Exam    Constitutional: Appears well-developed and well-nourished. No distress.  HENT:  Head: Normocephalic and atraumatic.  Neck: Neck supple. No tracheal deviation present. No thyromegaly present.  No cervical lymphadenopathy Cardiovascular: Normal rate, regular rhythm and normal heart sounds.   No murmur heard. No carotid bruit .  No edema Pulmonary/Chest: Effort normal and breath sounds normal. No respiratory distress. No has no wheezes. No  rales.  Skin: Skin is warm and dry. Not diaphoretic.  Psychiatric: Normal mood and affect. Behavior is normal.      Assessment & Plan:    See Problem List for Assessment and Plan of chronic medical problems.    This visit occurred during the SARS-CoV-2 public health emergency.  Safety protocols were in place, including screening questions prior to the visit, additional usage of staff PPE, and extensive cleaning of exam room while observing appropriate contact time as indicated for disinfecting solutions.    This encounter was created in error - please disregard.

## 2020-11-10 NOTE — Patient Instructions (Signed)
  Blood work was ordered.   ° ° °Medications changes include :    ° °Your prescription(s) have been submitted to your pharmacy. Please take as directed and contact our office if you believe you are having problem(s) with the medication(s). ° ° °A referral was ordered for        Someone from their office will call you to schedule an appointment.  ° ° °Please followup in 3 months ° °

## 2020-11-11 ENCOUNTER — Encounter: Payer: 59 | Admitting: Internal Medicine

## 2020-11-11 ENCOUNTER — Other Ambulatory Visit: Payer: Self-pay

## 2020-11-11 DIAGNOSIS — I10 Essential (primary) hypertension: Secondary | ICD-10-CM

## 2020-11-11 DIAGNOSIS — E1165 Type 2 diabetes mellitus with hyperglycemia: Secondary | ICD-10-CM

## 2020-11-11 DIAGNOSIS — M5416 Radiculopathy, lumbar region: Secondary | ICD-10-CM

## 2020-11-11 DIAGNOSIS — E782 Mixed hyperlipidemia: Secondary | ICD-10-CM

## 2020-11-11 DIAGNOSIS — E890 Postprocedural hypothyroidism: Secondary | ICD-10-CM

## 2020-11-16 ENCOUNTER — Ambulatory Visit
Admission: RE | Admit: 2020-11-16 | Discharge: 2020-11-16 | Disposition: A | Discharge status: Home / Self Care | Payer: 59 | Source: Ambulatory Visit | Attending: Obstetrics and Gynecology | Admitting: Obstetrics and Gynecology

## 2020-11-16 ENCOUNTER — Other Ambulatory Visit: Payer: Self-pay

## 2020-11-16 DIAGNOSIS — R2233 Localized swelling, mass and lump, upper limb, bilateral: Secondary | ICD-10-CM

## 2020-11-16 DIAGNOSIS — N6489 Other specified disorders of breast: Secondary | ICD-10-CM | POA: Diagnosis not present

## 2020-11-16 DIAGNOSIS — R928 Other abnormal and inconclusive findings on diagnostic imaging of breast: Secondary | ICD-10-CM | POA: Diagnosis not present

## 2020-11-17 ENCOUNTER — Other Ambulatory Visit: Payer: 59 | Admitting: Obstetrics and Gynecology

## 2020-11-17 ENCOUNTER — Other Ambulatory Visit: Payer: 59

## 2020-12-01 ENCOUNTER — Other Ambulatory Visit: Payer: 59 | Admitting: Obstetrics and Gynecology

## 2020-12-01 ENCOUNTER — Other Ambulatory Visit: Payer: 59

## 2020-12-09 ENCOUNTER — Encounter: Payer: Self-pay | Admitting: Internal Medicine

## 2020-12-21 ENCOUNTER — Ambulatory Visit: Payer: BC Managed Care – PPO | Admitting: Internal Medicine

## 2021-01-04 ENCOUNTER — Telehealth: Payer: Self-pay | Admitting: Internal Medicine

## 2021-01-04 NOTE — Telephone Encounter (Signed)
[facility] FYI:  ---Caller states she is having pressure in her head and sweating.  Go to ED now

## 2021-01-17 NOTE — Patient Instructions (Signed)
  Blood work was ordered.     Medications changes include :     Your prescription(s) have been submitted to your pharmacy. Please take as directed and contact our office if you believe you are having problem(s) with the medication(s).   A referral was ordered for        Someone from their office will call you to schedule an appointment.    Please followup in 6 months  

## 2021-01-17 NOTE — Progress Notes (Signed)
Subjective:    Patient ID: Erin [name], female    DOB: [birth year], 55 y.o.   MRN: [id number]  HPI The patient is here for follow up of their chronic medical problems, including DM, htn, hichol, hypothyroid, lumbar radiculopathy, gerd  Needs [place] 5  Medications and allergies reviewed with patient and updated if appropriate.  Patient Active Problem List   Diagnosis Date Noted   Chronic left hip pain 10/04/2020   Spasm of muscle of lower back 06/08/2020   Perimenopausal symptoms 09/16/2019   De Quervain's tenosynovitis, right 06/16/2019   Patellofemoral arthritis of left knee 11/28/2018   Left knee pain 11/24/2018   Episodic lightheadedness 10/24/2018   Metrorrhagia 10/24/2018   Lumbar back pain with radiculopathy affecting left lower extremity 10/07/2018   Right ovarian cyst    OSA (obstructive sleep apnea), severe 06/23/2018   Anxiety 03/05/2018   Chronic nonintractable headache 03/05/2018   Hyperlipidemia 01/23/2018   Elevated liver enzymes    Diabetes ([place]) 01/16/2018   DKA (diabetic ketoacidoses) 01/15/2018   Gastroesophageal reflux disease 10/05/2017   Temporal arteritis ([place]) 10/03/2017   Right foot pain 07/15/2017   Grief at loss of child 11/02/2016   Hypertension 09/28/2015   Morbid obesity ([place]) 42/87/6811   Umbilical hernia 57/26/2035   Chronic lower back pain 05/17/2015   Palpitations 05/17/2015   Iron deficiency anemia 09/03/2007   Hypothyroidism following radioiodine therapy 09/02/2007   INTERSTITIAL CYSTITIS 09/01/2007   ALLERGIC RHINITIS 08/07/2007   [place] DISEASE, LUMBAR 08/07/2007    Current Outpatient Medications on File Prior to Visit  Medication Sig Dispense Refill   Accu-Chek Softclix Lancets lancets Use as instructed to check sugars. Dx E11.65 100 each 12   blood glucose meter kit and supplies KIT Dispense based on patient and insurance preference. Use up to four times daily as directed. (FOR E11.65). 1 each 0   cholecalciferol (VITAMIN  D) 1000 units tablet Take 1,000 Units by mouth daily.     clotrimazole (LOTRIMIN) 1 % cream Apply 1 application topically 2 (two) times daily. Use for 2 weeks at a time as needed. 30 g 2   famotidine (PEPCID) 20 MG tablet Take 1 tablet (20 mg total) by mouth 2 (two) times daily. 180 tablet 3   gabapentin (NEURONTIN) 100 MG capsule TAKE 1 CAPSULE BY MOUTH IN THE MORNING AND 3 CAPSULES BY MOUTH AT BEDTIME. 120 capsule 5   glucose blood (ACCU-CHEK GUIDE) test strip Use as instructed to test sugars up to 4 times a day.  Dx E11.65 100 each 12   levonorgestrel (MIRENA) 20 MCG/24HR IUD 1 each by Intrauterine route once.      losartan (COZAAR) 50 MG tablet TAKE 1 TABLET BY MOUTH EVERY DAY 90 tablet 1   meloxicam (MOBIC) 15 MG tablet Take 1 tablet (15 mg total) by mouth daily. 30 tablet 3   methocarbamol (ROBAXIN) 750 MG tablet Take 1 tablet (750 mg total) by mouth 4 (four) times daily. 120 tablet 2   Multiple Vitamin (MULTIVITAMIN) tablet Take 1 tablet by mouth daily.     Semaglutide (RYBELSUS) 3 MG TABS Take 3 mg by mouth daily before breakfast. 30 tablet 0   TIADYLT ER 120 MG 24 hr capsule TAKE 1 CAPSULE BY MOUTH EVERY DAY 90 capsule 3   UNITHROID 112 MCG tablet Take 1 tablet (112 mcg total) by mouth daily before breakfast. 90 tablet 3   No current facility-administered medications on file prior to visit.    Past Medical History:  Diagnosis Date   Abnormal uterine bleeding    Anemia    COVID 12/2018   Depression    Diabetes mellitus without complication (HCC)    gestational   Elevated blood-pressure reading without diagnosis of hypertension    Endometriosis    Fibroids    GERD (gastroesophageal reflux disease)    Graves disease    Grief at loss of child    H/O gestational diabetes mellitus, not currently pregnant 09/28/2015   Hypertension    Hypothyroidism    Interstitial cystitis    Menorrhagia    Oropharyngeal candidiasis 01/16/2018   OSA (obstructive sleep apnea), severe 06/23/2018    Premature ventricular contractions    a. rare PVC by monitor 12/2016.   Right ovarian cyst    3 cm.   Thyroid disease    Uterine leiomyoma     Past Surgical History:  Procedure Laterality Date   APPENDECTOMY     ARTERY BIOPSY Left 10/25/2017   Procedure: BIOPSY TEMPORAL ARTERY;  Surgeon: Aviva Signs, MD;  Location: [facility];  Service: General;  Laterality: Left;   CESAREAN SECTION     CHOLECYSTECTOMY     HERNIA REPAIR     incisional   TUMOR REMOVAL  fibroids    Social History   Socioeconomic History   Marital status: Married    Spouse name: Not on file   Number of children: Not on file   Years of education: Not on file   Highest education level: Not on file  Occupational History   Not on file  Tobacco Use   Smoking status: Never   Smokeless tobacco: Never  Vaping Use   Vaping Use: Never used  Substance and Sexual Activity   Alcohol use: No   Drug use: No   Sexual activity: Never    Birth control/protection: I.U.D.    Comment: Mirena inserted 2015/2016  Other Topics Concern   Not on file  Social History Narrative   Not on file   Social Determinants of Health   Financial Resource Strain: Not on file  Food Insecurity: Not on file  Transportation Needs: Not on file  Physical Activity: Not on file  Stress: Not on file  Social Connections: Not on file    Family History  Problem Relation Age of Onset   Hypertension Mother    Cancer Mother    Breast cancer Mother 27   Hypertension Father    Diabetes Father    Cancer Father    Ovarian cancer Maternal Aunt    Thyroid disease Maternal Aunt        hypothyroid    Review of Systems     Objective:  There were no vitals filed for this visit. BP Readings from Last 3 Encounters:  10/05/20 130/80  08/16/20 112/70  08/12/20 140/74   Wt Readings from Last 3 Encounters:  10/05/20 (!) 363 lb (164.7 kg)  10/04/20 (!) 363 lb (164.7 kg)  08/16/20 (!) 363 lb (164.7 kg)   There is no height or weight on file to  calculate BMI.   Physical Exam    Constitutional: Appears well-developed and well-nourished. No distress.  HENT:  Head: Normocephalic and atraumatic.  Neck: Neck supple. No tracheal deviation present. No thyromegaly present.  No cervical lymphadenopathy Cardiovascular: Normal rate, regular rhythm and normal heart sounds.   No murmur heard. No carotid bruit .  No edema Pulmonary/Chest: Effort normal and breath sounds normal. No respiratory distress. No has no wheezes. No rales.  Skin: Skin is  warm and dry. Not diaphoretic.  Psychiatric: Normal mood and affect. Behavior is normal.      Assessment & Plan:    See Problem List for Assessment and Plan of chronic medical problems.    This visit occurred during the SARS-CoV-2 public health emergency.  Safety protocols were in place, including screening questions prior to the visit, additional usage of staff PPE, and extensive cleaning of exam room while observing appropriate contact time as indicated for disinfecting solutions.   This encounter was created in error - please disregard.

## 2021-01-18 ENCOUNTER — Encounter: Payer: 59 | Admitting: Internal Medicine

## 2021-01-18 DIAGNOSIS — E890 Postprocedural hypothyroidism: Secondary | ICD-10-CM

## 2021-01-18 DIAGNOSIS — I10 Essential (primary) hypertension: Secondary | ICD-10-CM

## 2021-01-18 DIAGNOSIS — E1165 Type 2 diabetes mellitus with hyperglycemia: Secondary | ICD-10-CM

## 2021-01-18 DIAGNOSIS — K219 Gastro-esophageal reflux disease without esophagitis: Secondary | ICD-10-CM

## 2021-01-18 DIAGNOSIS — E782 Mixed hyperlipidemia: Secondary | ICD-10-CM

## 2021-01-19 ENCOUNTER — Other Ambulatory Visit: Payer: Self-pay | Admitting: Internal Medicine

## 2021-01-30 DIAGNOSIS — G4733 Obstructive sleep apnea (adult) (pediatric): Secondary | ICD-10-CM | POA: Diagnosis not present

## 2021-01-30 DIAGNOSIS — G4731 Primary central sleep apnea: Secondary | ICD-10-CM | POA: Diagnosis not present

## 2021-01-31 NOTE — Progress Notes (Signed)
Subjective:    Patient ID: Erin [name], female    DOB: Nov 12, 1965, 55 y.o.   MRN: [id number]  HPI The patient is here for follow up of their chronic medical problems, including DM, htn, hichol, hypothyroid, lumbar radiculopathy, gerd   She is off the rybelsus - has sweats, blurry vision.  Sugars in 200's, but going up and down.    She is trying to watch her sugars and carb intake.  She is not exercising regularly.       Medications and allergies reviewed with patient and updated if appropriate.  Patient Active Problem List   Diagnosis Date Noted   Chronic left hip pain 10/04/2020   Spasm of muscle of lower back 06/08/2020   Perimenopausal symptoms 09/16/2019   De Quervain's tenosynovitis, right 06/16/2019   Patellofemoral arthritis of left knee 11/28/2018   Left knee pain 11/24/2018   Episodic lightheadedness 10/24/2018   Metrorrhagia 10/24/2018   Lumbar back pain with radiculopathy affecting left lower extremity 10/07/2018   Right ovarian cyst    OSA (obstructive sleep apnea), severe 06/23/2018   Anxiety 03/05/2018   Chronic nonintractable headache 03/05/2018   Hyperlipidemia 01/23/2018   Elevated liver enzymes    Diabetes ([place]) 01/16/2018   DKA (diabetic ketoacidoses) 01/15/2018   Gastroesophageal reflux disease 10/05/2017   Temporal arteritis ([place]) 10/03/2017   Right foot pain 07/15/2017   Grief at loss of child 11/02/2016   Hypertension 09/28/2015   Morbid obesity ([place]) 12/87/8676   Umbilical hernia 72/02/4708   Chronic lower back pain 05/17/2015   Palpitations 05/17/2015   Iron deficiency anemia 09/03/2007   Hypothyroidism following radioiodine therapy 09/02/2007   INTERSTITIAL CYSTITIS 09/01/2007   ALLERGIC RHINITIS 08/07/2007   [place] DISEASE, LUMBAR 08/07/2007    Current Outpatient Medications on File Prior to Visit  Medication Sig Dispense Refill   Accu-Chek Softclix Lancets lancets Use as instructed to check sugars. Dx E11.65 100 each 12   blood  glucose meter kit and supplies KIT Dispense based on patient and insurance preference. Use up to four times daily as directed. (FOR E11.65). 1 each 0   cholecalciferol (VITAMIN D) 1000 units tablet Take 1,000 Units by mouth daily.     clotrimazole (LOTRIMIN) 1 % cream Apply 1 application topically 2 (two) times daily. Use for 2 weeks at a time as needed. 30 g 2   famotidine (PEPCID) 20 MG tablet Take 1 tablet (20 mg total) by mouth 2 (two) times daily. 180 tablet 3   glucose blood (ACCU-CHEK GUIDE) test strip Use as instructed to test sugars up to 4 times a day.  Dx E11.65 100 each 12   levonorgestrel (MIRENA) 20 MCG/24HR IUD 1 each by Intrauterine route once.      losartan (COZAAR) 50 MG tablet TAKE 1 TABLET BY MOUTH EVERY DAY 90 tablet 1   meloxicam (MOBIC) 15 MG tablet Take 1 tablet (15 mg total) by mouth daily. 30 tablet 3   methocarbamol (ROBAXIN) 750 MG tablet Take 1 tablet (750 mg total) by mouth 4 (four) times daily. 120 tablet 2   Multiple Vitamin (MULTIVITAMIN) tablet Take 1 tablet by mouth daily.     TIADYLT ER 120 MG 24 hr capsule TAKE 1 CAPSULE BY MOUTH EVERY DAY 90 capsule 3   UNITHROID 112 MCG tablet Take 1 tablet (112 mcg total) by mouth daily before breakfast. 90 tablet 3   Semaglutide (RYBELSUS) 3 MG TABS Take 3 mg by mouth daily before breakfast. (Patient not taking: Reported  on 02/01/2021) 30 tablet 0   No current facility-administered medications on file prior to visit.    Past Medical History:  Diagnosis Date   Abnormal uterine bleeding    Anemia    COVID 12/2018   Depression    Diabetes mellitus without complication (HCC)    gestational   Elevated blood-pressure reading without diagnosis of hypertension    Endometriosis    Fibroids    GERD (gastroesophageal reflux disease)    Graves disease    Grief at loss of child    H/O gestational diabetes mellitus, not currently pregnant 09/28/2015   Hypertension    Hypothyroidism    Interstitial cystitis    Menorrhagia     Oropharyngeal candidiasis 01/16/2018   OSA (obstructive sleep apnea), severe 06/23/2018   Premature ventricular contractions    a. rare PVC by monitor 12/2016.   Right ovarian cyst    3 cm.   Thyroid disease    Uterine leiomyoma     Past Surgical History:  Procedure Laterality Date   APPENDECTOMY     ARTERY BIOPSY Left 10/25/2017   Procedure: BIOPSY TEMPORAL ARTERY;  Surgeon: Aviva Signs, MD;  Location: [facility];  Service: General;  Laterality: Left;   CESAREAN SECTION     CHOLECYSTECTOMY     HERNIA REPAIR     incisional   TUMOR REMOVAL  fibroids    Social History   Socioeconomic History   Marital status: Married    Spouse name: Not on file   Number of children: Not on file   Years of education: Not on file   Highest education level: Not on file  Occupational History   Not on file  Tobacco Use   Smoking status: Never   Smokeless tobacco: Never  Vaping Use   Vaping Use: Never used  Substance and Sexual Activity   Alcohol use: No   Drug use: No   Sexual activity: Never    Birth control/protection: I.U.D.    Comment: Mirena inserted 2015/2016  Other Topics Concern   Not on file  Social History Narrative   Not on file   Social Determinants of Health   Financial Resource Strain: Not on file  Food Insecurity: Not on file  Transportation Needs: Not on file  Physical Activity: Not on file  Stress: Not on file  Social Connections: Not on file    Family History  Problem Relation Age of Onset   Hypertension Mother    Cancer Mother    Breast cancer Mother 53   Hypertension Father    Diabetes Father    Cancer Father    Ovarian cancer Maternal Aunt    Thyroid disease Maternal Aunt        hypothyroid    Review of Systems  Constitutional:  Negative for fever.  Eyes:  Positive for visual disturbance (blurry vision w/ elevated sugar).  Respiratory:  Positive for shortness of breath (with walking and at rest - often needs to take a big deep breath). Negative for  cough and wheezing.   Cardiovascular:  Positive for chest pain (pulling pressure - not painful - intermittent, worse at rest, sometimes wakes her) and palpitations. Negative for leg swelling.  Neurological:  Positive for light-headedness (w/ elevated sugar) and headaches.      Objective:   Vitals:   02/01/21 1353  BP: 140/72  Pulse: 87  Temp: 98.7 F (37.1 C)  SpO2: 97%   BP Readings from Last 3 Encounters:  02/01/21 140/72  10/05/20 130/80  08/16/20 112/70   Wt Readings from Last 3 Encounters:  02/01/21 (!) 357 lb (161.9 kg)  10/05/20 (!) 363 lb (164.7 kg)  10/04/20 (!) 363 lb (164.7 kg)   Body mass index is 59.41 kg/m.   Physical Exam    Constitutional: Appears well-developed and well-nourished. No distress.  HENT:  Head: Normocephalic and atraumatic.  Neck: Neck supple. No tracheal deviation present. No thyromegaly present.  No cervical lymphadenopathy Cardiovascular: Normal rate, regular rhythm and normal heart sounds.   No murmur heard. No carotid bruit .  No edema Pulmonary/Chest: Effort normal and breath sounds normal. No respiratory distress. No has no wheezes. No rales.  Skin: Skin is warm and dry. Not diaphoretic.  Psychiatric: Normal mood and affect. Behavior is normal.      Assessment & Plan:    See Problem List for Assessment and Plan of chronic medical problems.    This visit occurred during the SARS-CoV-2 public health emergency.  Safety protocols were in place, including screening questions prior to the visit, additional usage of staff PPE, and extensive cleaning of exam room while observing appropriate contact time as indicated for disinfecting solutions.

## 2021-01-31 NOTE — Patient Instructions (Addendum)
  Blood work was ordered.     Medications changes include :  start trulicity injection [id number] mg once a week for two weeks - if well tolerated we can increase to 1.5 mg weekly.  Restart xanax 0.5 mg twice daily if needed   Your prescription(s) have been submitted to your pharmacy. Please take as directed and contact our office if you believe you are having problem(s) with the medication(s).   A referral was ordered for the eye doctor.     Please followup in 3 months   Tetanus vaccine given today.

## 2021-02-01 ENCOUNTER — Ambulatory Visit (INDEPENDENT_AMBULATORY_CARE_PROVIDER_SITE_OTHER): Payer: 59 | Admitting: Internal Medicine

## 2021-02-01 ENCOUNTER — Other Ambulatory Visit: Payer: Self-pay

## 2021-02-01 ENCOUNTER — Encounter: Payer: Self-pay | Admitting: Internal Medicine

## 2021-02-01 VITALS — BP 140/72 | HR 87 | Temp 98.7°F | Ht 65.0 in | Wt 357.0 lb

## 2021-02-01 DIAGNOSIS — F419 Anxiety disorder, unspecified: Secondary | ICD-10-CM | POA: Diagnosis not present

## 2021-02-01 DIAGNOSIS — D508 Other iron deficiency anemias: Secondary | ICD-10-CM

## 2021-02-01 DIAGNOSIS — Z1159 Encounter for screening for other viral diseases: Secondary | ICD-10-CM | POA: Diagnosis not present

## 2021-02-01 DIAGNOSIS — E1165 Type 2 diabetes mellitus with hyperglycemia: Secondary | ICD-10-CM

## 2021-02-01 DIAGNOSIS — I10 Essential (primary) hypertension: Secondary | ICD-10-CM

## 2021-02-01 DIAGNOSIS — M5416 Radiculopathy, lumbar region: Secondary | ICD-10-CM

## 2021-02-01 DIAGNOSIS — K219 Gastro-esophageal reflux disease without esophagitis: Secondary | ICD-10-CM | POA: Diagnosis not present

## 2021-02-01 DIAGNOSIS — E782 Mixed hyperlipidemia: Secondary | ICD-10-CM | POA: Diagnosis not present

## 2021-02-01 DIAGNOSIS — E890 Postprocedural hypothyroidism: Secondary | ICD-10-CM

## 2021-02-01 LAB — LIPID PANEL
Cholesterol: 227 mg/dL — ABNORMAL HIGH (ref 0–200)
HDL: 40.4 mg/dL (ref >39.00)
LDL Cholesterol: 156 mg/dL — ABNORMAL HIGH (ref 0–99)
NonHDL: 186.3
Total CHOL/HDL Ratio: 6
Triglycerides: 151 mg/dL — ABNORMAL HIGH (ref 0.0–149.0)
VLDL: 30.2 mg/dL (ref 0.0–40.0)

## 2021-02-01 LAB — CBC WITH DIFFERENTIAL/PLATELET
Basophils Absolute: 0.1 10*3/uL (ref 0.0–0.1)
Basophils Relative: 0.5 % (ref 0.0–3.0)
Eosinophils Absolute: 0.1 10*3/uL (ref 0.0–0.7)
Eosinophils Relative: 1 % (ref 0.0–5.0)
HCT: 37.2 % (ref 36.0–46.0)
Hemoglobin: 12.1 g/dL (ref 12.0–15.0)
Lymphocytes Relative: 43.1 % (ref 12.0–46.0)
Lymphs Abs: 5.1 10*3/uL — ABNORMAL HIGH (ref 0.7–4.0)
MCHC: 32.6 g/dL (ref 30.0–36.0)
MCV: 83.1 fl (ref 78.0–100.0)
Monocytes Absolute: 0.6 10*3/uL (ref 0.1–1.0)
Monocytes Relative: 5 % (ref 3.0–12.0)
Neutro Abs: 5.9 10*3/uL (ref 1.4–7.7)
Neutrophils Relative %: 50.4 % (ref 43.0–77.0)
Platelets: 316 10*3/uL (ref 150.0–400.0)
RBC: 4.48 Mil/uL (ref 3.87–5.11)
RDW: 14.7 % (ref 11.5–15.5)
WBC: 11.7 10*3/uL — ABNORMAL HIGH (ref 4.0–10.5)

## 2021-02-01 LAB — COMPREHENSIVE METABOLIC PANEL
ALT: 31 U/L (ref 0–35)
AST: 19 U/L (ref 0–37)
Albumin: 4.1 g/dL (ref 3.5–5.2)
Alkaline Phosphatase: 184 U/L — ABNORMAL HIGH (ref 39–117)
BUN: 11 mg/dL (ref 6–23)
CO2: 26 mEq/L (ref 19–32)
Calcium: 9.5 mg/dL (ref 8.4–10.5)
Chloride: 102 mEq/L (ref 96–112)
Creatinine, Ser: 0.8 mg/dL (ref 0.40–1.20)
GFR: 83.47 mL/min (ref >60.00)
Glucose, Bld: 223 mg/dL — ABNORMAL HIGH (ref 70–99)
Potassium: 3.8 mEq/L (ref 3.5–5.1)
Sodium: 139 mEq/L (ref 135–145)
Total Bilirubin: 0.7 mg/dL (ref 0.2–1.2)
Total Protein: 7.7 g/dL (ref 6.0–8.3)

## 2021-02-01 LAB — HEMOGLOBIN A1C: Hgb A1c MFr Bld: 9.6 % — ABNORMAL HIGH (ref 4.6–6.5)

## 2021-02-01 MED ORDER — TRULICITY 0.75 MG/0.5ML ~~LOC~~ SOAJ: 0.7500 mg | SUBCUTANEOUS | 0 refills | Status: DC

## 2021-02-01 MED ORDER — MELOXICAM 15 MG PO TABS
15.0000 mg | ORAL_TABLET | Freq: Every day | ORAL | 3 refills | Status: DC | PRN
Start: 2021-02-01 — End: 2021-04-11

## 2021-02-01 MED ORDER — ALPRAZOLAM 0.5 MG PO TABS: 0.5000 mg | ORAL_TABLET | Freq: Two times a day (BID) | ORAL | 0 refills | Status: DC | PRN

## 2021-02-01 NOTE — Assessment & Plan Note (Signed)
Chronic Controlled Taking meloxican 15 mg qd prn only Continue methocarbamol 750 mg QID prn

## 2021-02-01 NOTE — Assessment & Plan Note (Signed)
Chronic GERD controlled Continue pepcid 20 mg bid

## 2021-02-01 NOTE — Assessment & Plan Note (Signed)
H/o iron def anemia Has IUD No heavy bleeding Check cbc

## 2021-02-01 NOTE — Assessment & Plan Note (Addendum)
Chronic Not controlled Off rybelsus due to cost Will try trulicity [id number] mg q week - titrate up if tolerated Stressed regular exercise, weight loss and diabetic diet Check a1c Due for eye exam  - referred

## 2021-02-01 NOTE — Assessment & Plan Note (Signed)
Chronic Check lipids Discussed that she needs to be on a statin Work on lifestyle changes Will hold off on statin with starting new DM medication

## 2021-02-01 NOTE — Assessment & Plan Note (Signed)
Chronic Management per Dr Gherghe 

## 2021-02-01 NOTE — Assessment & Plan Note (Signed)
Chronic Increased  Restart xanax 0.5 mg bid prn

## 2021-02-01 NOTE — Assessment & Plan Note (Addendum)
Chronic BP ok - ideally should be lower -  Stressed lifestyle changes Continue losartan 50 mg, tiadylt 120 mg daily cmp

## 2021-02-02 LAB — HEPATITIS C ANTIBODY
Hepatitis C Ab: NONREACTIVE
SIGNAL TO CUT-OFF: 0.01 (ref <1.00)

## 2021-02-03 ENCOUNTER — Encounter: Payer: Self-pay | Admitting: Internal Medicine

## 2021-02-06 MED ORDER — GLIMEPIRIDE 4 MG PO TABS: 4.0000 mg | ORAL_TABLET | Freq: Every day | ORAL | 3 refills | Status: DC

## 2021-02-06 MED ORDER — EMPAGLIFLOZIN 10 MG PO TABS: 10.0000 mg | ORAL_TABLET | Freq: Every day | ORAL | 5 refills | Status: DC

## 2021-02-15 NOTE — Addendum Note (Signed)
Addended by: Binnie Rail on: 02/15/2021 07:37 AM   Modules accepted: Orders

## 2021-02-20 ENCOUNTER — Telehealth: Payer: Self-pay | Admitting: Obstetrics and Gynecology

## 2021-02-20 NOTE — Telephone Encounter (Signed)
Please contact patient to reschedule her pelvic ultrasound and IUD removal.   She presented with pelvic pain in April and I was not able to find her IUD strings.   She had an appointment for the ultrasound. It was cancelled but not rescheduled.  Just for her information, Mirena IUD has now been approved for 8 years of use, including her Mirena.  I do not have the definitive date of her IUD insertion.  She may need to check with [facility] for this date.

## 2021-02-21 NOTE — Telephone Encounter (Signed)
I called and phone rang several times, then a message came on saying" your call can not be complete at this time"

## 2021-02-23 ENCOUNTER — Ambulatory Visit: Payer: 59 | Admitting: Internal Medicine

## 2021-02-23 NOTE — Telephone Encounter (Signed)
Left message for patient to call.

## 2021-03-01 NOTE — Telephone Encounter (Signed)
Dr.Silva patient phone is not working.Nor has she read her my chart message. Do you want me to send a letter with information included in letter or a letter to contact the office?

## 2021-03-02 NOTE — Telephone Encounter (Signed)
Yes, please send the patient a letter with the information included and request to call to schedule her appointment.

## 2021-03-03 ENCOUNTER — Encounter: Payer: Self-pay | Admitting: *Deleted

## 2021-03-03 NOTE — Telephone Encounter (Signed)
Letter mailed with information notes in letter and asked patient to call regarding this as well.

## 2021-03-08 NOTE — Addendum Note (Signed)
Addended by: Binnie Rail on: 03/08/2021 08:28 PM   Modules accepted: Orders

## 2021-03-16 ENCOUNTER — Encounter: Payer: Self-pay | Admitting: Internal Medicine

## 2021-03-16 ENCOUNTER — Other Ambulatory Visit: Payer: Self-pay

## 2021-03-16 DIAGNOSIS — E1165 Type 2 diabetes mellitus with hyperglycemia: Secondary | ICD-10-CM

## 2021-03-16 MED ORDER — ACCU-CHEK SOFTCLIX LANCETS MISC: 12 refills | Status: DC

## 2021-03-16 MED ORDER — ACCU-CHEK GUIDE VI STRP: ORAL_STRIP | 12 refills | Status: DC

## 2021-03-28 MED ORDER — UNITHROID 112 MCG PO TABS: 112.0000 ug | ORAL_TABLET | Freq: Every day | ORAL | 3 refills | Status: DC

## 2021-03-29 MED ORDER — BLOOD GLUCOSE MONITOR KIT: PACK | 0 refills | Status: DC

## 2021-03-29 NOTE — Addendum Note (Signed)
Addended by: Binnie Rail on: 03/29/2021 07:30 AM   Modules accepted: Orders

## 2021-04-04 ENCOUNTER — Encounter: Payer: Self-pay | Admitting: Internal Medicine

## 2021-04-05 ENCOUNTER — Other Ambulatory Visit: Payer: Self-pay

## 2021-04-05 ENCOUNTER — Other Ambulatory Visit: Payer: Self-pay | Admitting: Internal Medicine

## 2021-04-05 ENCOUNTER — Ambulatory Visit (INDEPENDENT_AMBULATORY_CARE_PROVIDER_SITE_OTHER): Payer: 59 | Admitting: Internal Medicine

## 2021-04-05 VITALS — BP 136/92 | HR 88 | Ht 65.0 in | Wt 359.0 lb

## 2021-04-05 DIAGNOSIS — E1165 Type 2 diabetes mellitus with hyperglycemia: Secondary | ICD-10-CM

## 2021-04-05 DIAGNOSIS — E890 Postprocedural hypothyroidism: Secondary | ICD-10-CM | POA: Diagnosis not present

## 2021-04-05 LAB — T4, FREE: Free T4: 0.79 ng/dL (ref 0.60–1.60)

## 2021-04-05 LAB — TSH: TSH: 11.52 u[IU]/mL — ABNORMAL HIGH (ref 0.35–5.50)

## 2021-04-05 NOTE — Progress Notes (Signed)
Patient ID: Erin [name], female   DOB: [birth year], 55 y.o.   MRN: [id number]   This visit occurred during the SARS-CoV-2 public health emergency.  Safety protocols were in place, including screening questions prior to the visit, additional usage of staff PPE, and extensive cleaning of exam room while observing appropriate contact time as indicated for disinfecting solutions.   HPI  Erin [name] is a 55 y.o.-year-old female, initially referred by her PCP, Dr. Quay Burow, presenting for follow-up for postablative hypothyroidism.  She previously saw Dr. Dwyane Dee.  Last visit with me 10 months ago.  Previous visit 1 year and 2 months prior.  Interim history: No increased urination, blurry vision, nausea, chest pain. She does mention occasional dysphagia with drier foods.  Reviewed history: Pt. has been dx with hypothyroidism after RAI treatment for Graves' disease in 08/2008 >> on Unithroid d.a.w.. She had a lot of variability in the TFTs on generic LT4.  In the past she was not taking the medication correctly and we discussed about how to do so.  At last visit, TSH was elevated so I advised her to increase her LT4 dose.  However, she did not return for repeat labs afterwards and, upon questioning, at last visit, she was still on Unithroid 100 mcg daily...  We increase the dose to 112 mcg daily in 05/2020.  She is taking this: - in am - fasting - at least 30 min from b'fast - no calcium - no iron - no multivitamins - no PPIs, on H2 blocker  - not lately - stopped Biotin  Reviewed her TFTs: Lab Results  Component Value Date   TSH 5.98 (H) 06/22/2020   TSH 6.72 (H) 06/08/2020   TSH 8.72 (H) 10/24/2018   TSH 1.34 05/16/2018   TSH 0.43 03/13/2018   TSH 1.06 01/29/2018   TSH 0.288 (L) 01/15/2018   TSH 0.14 (L) 11/27/2017   TSH 0.09 (L) 08/27/2017   TSH 18.55 (H) 06/27/2017   FREET4 0.87 06/22/2020   FREET4 0.83 05/16/2018   FREET4 1.14 11/27/2017   FREET4 1.11 08/27/2017   FREET4  0.67 06/27/2017   FREET4 1.17 10/29/2016   FREET4 0.72 02/14/2016   FREET4 1.07 07/25/2015   FREET4 0.81 11/17/2014   FREET4 0.75 08/03/2014   T3FREE 6.7 (H) 09/04/2007   Antithyroid antibodies: No results found for: THGAB No components found for: TPOAB  Pt denies: - feeling nodules in neck - hoarseness - choking - SOB with lying down She has  dysphagia with dry foods.   She has + FH of thyroid disorders in: mother, M aunts. No FH of thyroid cancer. No h/o radiation tx to head or neck.  No herbal supplements but she plans to start curcumin. No Biotin use. No recent steroids use.   Uncontrolled diabetes, with DKA in 12/2017.  She previously wanted me to also start managing this, but she was lost to follow-up for me afterwards.  This is is currently managed by PCP.  Reviewed HbA1c levels: Lab Results  Component Value Date   HGBA1C 9.6 (H) 02/01/2021   HGBA1C 8.6 (H) 06/08/2020   HGBA1C 7.9 (H) 09/16/2019   HGBA1C 7.2 (H) 02/03/2019   HGBA1C 6.9 (H) 10/24/2018   HGBA1C 6.2 (A) 08/29/2018   HGBA1C 10.3 (H) 03/13/2018   HGBA1C 12.7 (H) 01/15/2018   HGBA1C 8.9 (H) 12/13/2017   HGBA1C 6.3 06/11/2017   Her diabetes was diet controlled in the past but PCP rec'd Rybelsus when HbA1c started to increase, however, this  was expensive. Trulicity was also expensive. Amaryl caused stomach discomfort. Now trying to manage it non-medically.   She is not checking blood sugars now.  No CKD: Lab Results  Component Value Date   BUN 11 02/01/2021   Lab Results  Component Value Date   CREATININE 0.80 02/01/2021  On Cozaar 50.  + HL: Lab Results  Component Value Date   CHOL 227 (H) 02/01/2021   HDL 40.40 02/01/2021   LDLCALC 156 (H) 02/01/2021   TRIG 151.0 (H) 02/01/2021   CHOLHDL 6 02/01/2021  She is not on a statin.  Last eye exam: 01/2021: No DR-Dr. Sarajane Marek  She was on high doses on Prednisone (60 mg) in the past, then decreased.  Now off prednisone.  ROS: Signs  see HPI  I reviewed pt's medications, allergies, PMH, social hx, family hx, and changes were documented in the history of present illness. Otherwise, unchanged from my initial visit note.  Past Medical History:  Diagnosis Date   Abnormal uterine bleeding    Anemia    COVID 12/2018   Depression    Diabetes mellitus without complication (HCC)    gestational   Elevated blood-pressure reading without diagnosis of hypertension    Endometriosis    Fibroids    GERD (gastroesophageal reflux disease)    Graves disease    Grief at loss of child    H/O gestational diabetes mellitus, not currently pregnant 09/28/2015   Hypertension    Hypothyroidism    Interstitial cystitis    Menorrhagia    Oropharyngeal candidiasis 01/16/2018   OSA (obstructive sleep apnea), severe 06/23/2018   Premature ventricular contractions    a. rare PVC by monitor 12/2016.   Right ovarian cyst    3 cm.   Thyroid disease    Uterine leiomyoma    Past Surgical History:  Procedure Laterality Date   APPENDECTOMY     ARTERY BIOPSY Left 10/25/2017   Procedure: BIOPSY TEMPORAL ARTERY;  Surgeon: Aviva Signs, MD;  Location: [facility];  Service: General;  Laterality: Left;   CESAREAN SECTION     CHOLECYSTECTOMY     HERNIA REPAIR     incisional   TUMOR REMOVAL  fibroids   Social History   Socioeconomic History   Marital status: Married    Spouse name: Not on file   Number of children: Not on file   Years of education: Not on file   Highest education level: Not on file  Occupational History   Not on file  Tobacco Use   Smoking status: Never   Smokeless tobacco: Never  Vaping Use   Vaping Use: Never used  Substance and Sexual Activity   Alcohol use: No   Drug use: No   Sexual activity: Never    Birth control/protection: I.U.D.    Comment: Mirena inserted 2015/2016  Other Topics Concern   Not on file  Social History Narrative   Not on file   Social Determinants of Health   Financial Resource Strain: Not  on file  Food Insecurity: Not on file  Transportation Needs: Not on file  Physical Activity: Not on file  Stress: Not on file  Social Connections: Not on file  Intimate Partner Violence: Not on file   Current Outpatient Medications on File Prior to Visit  Medication Sig Dispense Refill   Accu-Chek Softclix Lancets lancets Use as instructed to check sugars. Dx E11.65 100 each 12   ALPRAZolam (XANAX) 0.5 MG tablet Take 1 tablet (0.5 mg total) by  mouth 2 (two) times daily as needed for anxiety. 60 tablet 0   blood glucose meter kit and supplies KIT Dispense based on patient and insurance preference. Use  daily as directd. (FOR E11.65). 1 each 0   cholecalciferol (VITAMIN D) 1000 units tablet Take 1,000 Units by mouth daily.     clotrimazole (LOTRIMIN) 1 % cream Apply 1 application topically 2 (two) times daily. Use for 2 weeks at a time as needed. 30 g 2   famotidine (PEPCID) 20 MG tablet Take 1 tablet (20 mg total) by mouth 2 (two) times daily. 180 tablet 3   glucose blood (ACCU-CHEK GUIDE) test strip Use as instructed to test sugars up to 4 times a day.  Dx E11.65 100 each 12   levonorgestrel (MIRENA) 20 MCG/24HR IUD 1 each by Intrauterine route once.      losartan (COZAAR) 50 MG tablet TAKE 1 TABLET BY MOUTH EVERY DAY 90 tablet 1   meloxicam (MOBIC) 15 MG tablet Take 1 tablet (15 mg total) by mouth daily as needed for pain. 30 tablet 3   methocarbamol (ROBAXIN) 750 MG tablet Take 1 tablet (750 mg total) by mouth 4 (four) times daily. 120 tablet 2   Multiple Vitamin (MULTIVITAMIN) tablet Take 1 tablet by mouth daily.     TIADYLT ER 120 MG 24 hr capsule TAKE 1 CAPSULE BY MOUTH EVERY DAY 90 capsule 3   UNITHROID 112 MCG tablet Take 1 tablet (112 mcg total) by mouth daily before breakfast. 90 tablet 3   No current facility-administered medications on file prior to visit.   Allergies  Allergen Reactions   Prednisone Other (See Comments)    Raises blood sugar so high that she receives  intensive care   Glimepiride     Stomach upset   Prozac [Fluoxetine Hcl] Nausea Only   Hydrocodone Hives, Itching and Rash    On thighs    Metformin And Related     diarrhea   Family History  Problem Relation Age of Onset   Hypertension Mother    Cancer Mother    Breast cancer Mother 89   Hypertension Father    Diabetes Father    Cancer Father    Ovarian cancer Maternal Aunt    Thyroid disease Maternal Aunt        hypothyroid   PE: BP (!) 136/92 (BP Location: Left Arm, Patient Position: Sitting, Cuff Size: Normal)   Pulse 88   Ht 5' 5"  (1.651 m)   Wt (!) 359 lb (162.8 kg)   SpO2 96%   BMI 59.74 kg/m  Wt Readings from Last 3 Encounters:  04/05/21 (!) 359 lb (162.8 kg)  02/01/21 (!) 357 lb (161.9 kg)  10/05/20 (!) 363 lb (164.7 kg)   Constitutional: overweight, in NAD Eyes: PERRLA, EOMI, no exophthalmos ENT: moist mucous membranes, no thyromegaly, no cervical lymphadenopathy Cardiovascular: RRR, No MRG Respiratory: CTA B Gastrointestinal: abdomen soft, NT, ND, BS+ Musculoskeletal: no deformities, strength intact in [name] 4 Skin: moist, warm, no rashes Neurological: no tremor with outstretched hands, DTR normal in [name] 4  ASSESSMENT: 1. Hypothyroidism - After RAI treatment for Graves' disease  2. 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 Patient with longstanding, uncontrolled, hypothyroidism, on levothyroxine therapy.  She is not usually compliant with appointments.  She now returns 10 months after the previous visit. - latest thyroid labs reviewed with pt. >> TSH was elevated so we increased her levothyroxine dose at that time, but unfortunately she did not return  for labs afterwards: Lab Results  Component Value Date   TSH 5.98 (H) 06/22/2020  - she continues on LT4 112 mcg daily - pt feels good on this dose. - we discussed about taking the thyroid hormone every day, with water, >30 minutes before breakfast, separated by >4 hours from acid reflux medications, calcium,  iron, multivitamins. Pt. is taking it correctly. - will check thyroid tests today: TSH and fT4, however, she was off the medication for 2-3 days after running out. - at this visit we also discussed that if she forgets to take the medication 1 day, she can take 2 tablets the next day - If labs are abnormal, she will need to return for repeat TFTs in 1.5 months -I did advise her that we do need to check her blood sugars after any change in dose, after approximately 5 to 6 weeks.  2 DM 2/2 steroid use -In the past, she wanted me to manage this, but she was not compliant with appointments and continues to follow with PCP. -Latest HbA1c was reviewed from 01/2021 and this was higher, at 9.6%. -Previously on Rybelsus added by PCP, but she could not tolerate it due to increased sweating and blurry vision.  It was also expensive.  She also tried Trulicity which was expensive.  Amaryl caused stomachache. -As of now, she is not on any diabetic medication.  I advised her that this is dangerous practice and she can develop complications from it.  We reviewed her HbA1c going back to 08/2018 and her HbA1c continues to increase.  However, for now she would like to stay without medications and try curcumin to help with blood sugar control.  I did advise her that the effect of protamine on blood sugar is only slight, I would not expect this to improve her HbA1c from 9.6%. -We will continue to follow with PCP for this.  Component     Latest Ref Rng & Units 04/05/2021  TSH     0.35 - 5.50 uIU/mL 11.52 (H)  T4,Free(Direct)     0.60 - 1.60 ng/dL 0.79   TSH is still higher than before.  Since she mentions that she is taking levothyroxine correctly, we will increase the dose to 137 mcg daily and repeat the test in 1.5 months.  Philemon Kingdom, MD PhD [facility] Endocrinology

## 2021-04-05 NOTE — Patient Instructions (Signed)
Please continue Unithroid 112 mcg daily.  Take the thyroid hormone every day, with water, at least 30 minutes before breakfast, separated by at least 4 hours from: - acid reflux medications - calcium - iron - multivitamins  Please stop at the lab.  Please come back for a follow-up appointment in 6 months.

## 2021-04-06 ENCOUNTER — Encounter: Payer: Self-pay | Admitting: Internal Medicine

## 2021-04-06 ENCOUNTER — Telehealth: Payer: Self-pay | Admitting: Internal Medicine

## 2021-04-06 MED ORDER — LEVOTHYROXINE SODIUM 137 MCG PO TABS: 137.0000 ug | ORAL_TABLET | Freq: Every day | ORAL | 5 refills | Status: DC

## 2021-04-06 NOTE — Telephone Encounter (Signed)
[facility]   Caller states both feet were numb and tingling. She has feeling in her right foot but not her left foot  Advised to go to ED now. Patient understood and decided to go to ENDO

## 2021-04-08 ENCOUNTER — Other Ambulatory Visit: Payer: Self-pay | Admitting: Internal Medicine

## 2021-04-10 ENCOUNTER — Encounter: Payer: Self-pay | Admitting: Internal Medicine

## 2021-04-10 NOTE — Telephone Encounter (Signed)
Patient is requesting a refill of the following medications: Requested Prescriptions   Pending Prescriptions Disp Refills   meloxicam (MOBIC) 15 MG tablet [Pharmacy Med Name: MELOXICAM 15 MG TABLET] 30 tablet 3    Sig: TAKE 1 TABLET (15 MG TOTAL) BY MOUTH DAILY.    Date of patient request: 04/10/21  Last office visit: 02/01/21  Date of last refill: 02/01/21  Last refill amount: 30,3  Follow up time period per chart: n/a

## 2021-04-14 ENCOUNTER — Encounter: Payer: Self-pay | Admitting: Internal Medicine

## 2021-04-14 DIAGNOSIS — E890 Postprocedural hypothyroidism: Secondary | ICD-10-CM

## 2021-04-17 MED ORDER — UNITHROID 137 MCG PO TABS: 137.0000 ug | ORAL_TABLET | Freq: Every day | ORAL | 3 refills | Status: DC

## 2021-04-29 ENCOUNTER — Other Ambulatory Visit: Payer: Self-pay | Admitting: Internal Medicine

## 2021-05-01 DIAGNOSIS — G4733 Obstructive sleep apnea (adult) (pediatric): Secondary | ICD-10-CM | POA: Diagnosis not present

## 2021-05-01 DIAGNOSIS — G4731 Primary central sleep apnea: Secondary | ICD-10-CM | POA: Diagnosis not present

## 2021-05-06 NOTE — Telephone Encounter (Signed)
Encounter reviewed and closed.  

## 2021-05-07 ENCOUNTER — Other Ambulatory Visit: Payer: Self-pay | Admitting: Internal Medicine

## 2021-05-08 ENCOUNTER — Encounter: Payer: Self-pay | Admitting: Internal Medicine

## 2021-05-08 ENCOUNTER — Other Ambulatory Visit: Payer: Self-pay

## 2021-05-08 ENCOUNTER — Other Ambulatory Visit (HOSPITAL_COMMUNITY): Payer: Self-pay

## 2021-05-08 DIAGNOSIS — E1165 Type 2 diabetes mellitus with hyperglycemia: Secondary | ICD-10-CM

## 2021-05-08 MED ORDER — LOSARTAN POTASSIUM 50 MG PO TABS
50.0000 mg | ORAL_TABLET | Freq: Every day | ORAL | 1 refills | Status: DC
  Filled 2021-05-08 – 2021-05-16 (×2): qty 90, 90d supply, fill #0
  Filled 2021-08-14: qty 90, 90d supply, fill #1

## 2021-05-10 ENCOUNTER — Encounter: Payer: Self-pay | Admitting: Internal Medicine

## 2021-05-11 ENCOUNTER — Emergency Department (HOSPITAL_BASED_OUTPATIENT_CLINIC_OR_DEPARTMENT_OTHER): Payer: 59

## 2021-05-11 ENCOUNTER — Encounter (HOSPITAL_BASED_OUTPATIENT_CLINIC_OR_DEPARTMENT_OTHER): Payer: Self-pay

## 2021-05-11 ENCOUNTER — Other Ambulatory Visit: Payer: Self-pay

## 2021-05-11 ENCOUNTER — Emergency Department (HOSPITAL_BASED_OUTPATIENT_CLINIC_OR_DEPARTMENT_OTHER)
Admission: EM | Admit: 2021-05-11 | Discharge: 2021-05-11 | Disposition: A | Discharge status: Home / Self Care | Payer: 59 | Attending: Emergency Medicine | Admitting: Emergency Medicine

## 2021-05-11 DIAGNOSIS — M25562 Pain in left knee: Secondary | ICD-10-CM | POA: Insufficient documentation

## 2021-05-11 DIAGNOSIS — E119 Type 2 diabetes mellitus without complications: Secondary | ICD-10-CM | POA: Insufficient documentation

## 2021-05-11 DIAGNOSIS — Z043 Encounter for examination and observation following other accident: Secondary | ICD-10-CM | POA: Diagnosis not present

## 2021-05-11 DIAGNOSIS — I10 Essential (primary) hypertension: Secondary | ICD-10-CM | POA: Diagnosis not present

## 2021-05-11 DIAGNOSIS — M1712 Unilateral primary osteoarthritis, left knee: Secondary | ICD-10-CM | POA: Diagnosis not present

## 2021-05-11 DIAGNOSIS — W19XXXA Unspecified fall, initial encounter: Secondary | ICD-10-CM | POA: Insufficient documentation

## 2021-05-11 DIAGNOSIS — Z79899 Other long term (current) drug therapy: Secondary | ICD-10-CM | POA: Diagnosis not present

## 2021-05-11 DIAGNOSIS — Z8616 Personal history of COVID-19: Secondary | ICD-10-CM | POA: Insufficient documentation

## 2021-05-11 DIAGNOSIS — M25561 Pain in right knee: Secondary | ICD-10-CM | POA: Insufficient documentation

## 2021-05-11 DIAGNOSIS — E039 Hypothyroidism, unspecified: Secondary | ICD-10-CM | POA: Diagnosis not present

## 2021-05-11 MED ORDER — ACETAMINOPHEN 325 MG PO TABS
650.0000 mg | ORAL_TABLET | Freq: Once | ORAL | Status: AC
  Administered 2021-05-11: 11:00:00 650 mg via ORAL
  Filled 2021-05-11: qty 2

## 2021-05-11 NOTE — ED Notes (Signed)
Order changed to Knee Immobilizer per PA

## 2021-05-11 NOTE — ED Provider Notes (Signed)
[facility]ENC[facility] DEPARTMENT Provider Note   CSN: [id number] Arrival date & time: 05/11/21  5993     History Chief Complaint  Patient presents with   Fall    Erin [name] is a 55 y.o. female.  HPI 55 year old female with a history of abnormal uterine bleeding, stational diabetes, depression, anemia, fibroids, GERD, Graves' disease, hypertension, hypothyroidism, OSA presents to the ER with complaints of bilateral knee pain.  Patient states that she was walking and missed a curb and fell onto her right knee.  She states that she had used her left knee to try to get up, and is now having pain worse than the left knee than in the right.  She denies any popping sensation, has been able to ambulate though with pain since the fall.  She denies hitting her head.  She presents to the ER to be evaluated for possible fracture.  She has taken meloxicam and Robaxin for her symptoms as she states that she feels that her hip is now tightening up.  She states that these have been helping her muscle tightness but have not been helping her pain in her knees.  She denies any numbness or tingling.    Past Medical History:  Diagnosis Date   Abnormal uterine bleeding    Anemia    COVID 12/2018   Depression    Diabetes mellitus without complication (HCC)    gestational   Elevated blood-pressure reading without diagnosis of hypertension    Endometriosis    Fibroids    GERD (gastroesophageal reflux disease)    Graves disease    Grief at loss of child    H/O gestational diabetes mellitus, not currently pregnant 09/28/2015   Hypertension    Hypothyroidism    Interstitial cystitis    Menorrhagia    Oropharyngeal candidiasis 01/16/2018   OSA (obstructive sleep apnea), severe 06/23/2018   Premature ventricular contractions    a. rare PVC by monitor 12/2016.   Right ovarian cyst    3 cm.   Thyroid disease    Uterine leiomyoma     Patient Active Problem List   Diagnosis Date Noted    Chronic left hip pain 10/04/2020   Spasm of muscle of lower back 06/08/2020   Perimenopausal symptoms 09/16/2019   De Quervain's tenosynovitis, right 06/16/2019   Patellofemoral arthritis of left knee 11/28/2018   Left knee pain 11/24/2018   Episodic lightheadedness 10/24/2018   Metrorrhagia 10/24/2018   Lumbar back pain with radiculopathy affecting left lower extremity 10/07/2018   Right ovarian cyst    OSA (obstructive sleep apnea), severe 06/23/2018   Anxiety 03/05/2018   Chronic nonintractable headache 03/05/2018   Hyperlipidemia 01/23/2018   Elevated liver enzymes    Diabetes ([place]) 01/16/2018   DKA (diabetic ketoacidoses) 01/15/2018   Gastroesophageal reflux disease 10/05/2017   Temporal arteritis ([place]) 10/03/2017   Right foot pain 07/15/2017   Grief at loss of child 11/02/2016   Hypertension 09/28/2015   Morbid obesity ([place]) 57/06/7791   Umbilical hernia 90/30/0923   Chronic lower back pain 05/17/2015   Palpitations 05/17/2015   Iron deficiency anemia 09/03/2007   Hypothyroidism following radioiodine therapy 09/02/2007   INTERSTITIAL CYSTITIS 09/01/2007   ALLERGIC RHINITIS 08/07/2007   [place] DISEASE, LUMBAR 08/07/2007    Past Surgical History:  Procedure Laterality Date   APPENDECTOMY     ARTERY BIOPSY Left 10/25/2017   Procedure: BIOPSY TEMPORAL ARTERY;  Surgeon: Aviva Signs, MD;  Location: [facility];  Service: General;  Laterality:  Left;   CESAREAN SECTION     CHOLECYSTECTOMY     HERNIA REPAIR     incisional   TUMOR REMOVAL  fibroids     OB History     Gravida  8   Para      Term      Preterm      AB      Living  8      SAB      IAB      Ectopic      Multiple      Live Births              Family History  Problem Relation Age of Onset   Hypertension Mother    Cancer Mother    Breast cancer Mother 15   Hypertension Father    Diabetes Father    Cancer Father    Ovarian cancer Maternal Aunt    Thyroid disease Maternal Aunt         hypothyroid    Social History   Tobacco Use   Smoking status: Never   Smokeless tobacco: Never  Vaping Use   Vaping Use: Never used  Substance Use Topics   Alcohol use: No   Drug use: No    Home Medications Prior to Admission medications   Medication Sig Start Date End Date Taking? Authorizing Provider  ALPRAZolam Duanne Moron) 0.5 MG tablet Take 1 tablet (0.5 mg total) by mouth 2 (two) times daily as needed for anxiety. 02/01/21   Binnie Rail, MD  blood glucose meter kit and supplies KIT Dispense based on patient and insurance preference. Use  daily as directd. (FOR E11.65). 03/29/21   Binnie Rail, MD  cholecalciferol (VITAMIN D) 1000 units tablet Take 1,000 Units by mouth daily.    [provider]  clotrimazole (LOTRIMIN) 1 % cream Apply 1 application topically 2 (two) times daily. Use for 2 weeks at a time as needed. 10/05/20   Nunzio Cobbs, MD  famotidine (PEPCID) 20 MG tablet Take 1 tablet (20 mg total) by mouth 2 (two) times daily. 08/12/20   Thornton Park, MD  levonorgestrel (MIRENA) 20 MCG/24HR IUD 1 each by Intrauterine route once.  06/26/15   [provider]  losartan (COZAAR) 50 MG tablet Take 1 tablet (50 mg total) by mouth daily. 05/08/21   Binnie Rail, MD  meloxicam (MOBIC) 15 MG tablet TAKE 1 TABLET (15 MG TOTAL) BY MOUTH DAILY. 04/11/21   Binnie Rail, MD  methocarbamol (ROBAXIN) 750 MG tablet Take 1 tablet (750 mg total) by mouth 4 (four) times daily. 06/08/20   Binnie Rail, MD  Multiple Vitamin (MULTIVITAMIN) tablet Take 1 tablet by mouth daily.    [provider]  TIADYLT ER 120 MG 24 hr capsule TAKE 1 CAPSULE BY MOUTH EVERY DAY 01/19/21   Binnie Rail, MD  UNITHROID 137 MCG tablet Take 1 tablet (137 mcg total) by mouth daily before breakfast. 04/17/21   Philemon Kingdom, MD    Allergies    Prednisone, Glimepiride, Prozac [fluoxetine hcl], Hydrocodone, and Metformin and related  Review of Systems   Review of  Systems Ten systems reviewed and are negative for acute change, except as noted in the HPI.    Physical Exam Updated Vital Signs BP (!) 155/95 (BP Location: Right Arm)   Pulse (!) 59   Temp 98.3 F (36.8 C) (Oral)   Resp 18   Ht 5' 5" (1.651 m)  Wt (!) 161.9 kg   SpO2 99%   BMI 59.41 kg/m   Physical Exam Vitals and nursing note reviewed.  Constitutional:      General: She is not in acute distress.    Appearance: She is well-developed.  HENT:     Head: Normocephalic and atraumatic.  Eyes:     Conjunctiva/sclera: Conjunctivae normal.  Cardiovascular:     Rate and Rhythm: Normal rate and regular rhythm.     Heart sounds: No murmur heard. Pulmonary:     Effort: Pulmonary effort is normal. No respiratory distress.     Breath sounds: Normal breath sounds.  Abdominal:     Palpations: Abdomen is soft.     Tenderness: There is no abdominal tenderness.  Musculoskeletal:        General: No swelling.     Cervical back: Neck supple.     Comments: Generalized tenderness to palpation to both knees bilaterally.  No evidence of skin abrasions, deformities, crepitus, fluctuance.  Full flexion and extension of both knees though with some pain.  No tibial plateau tenderness bilaterally.  2+ DP pulses.  Lower extremities warm, sensations intact.  Skin:    General: Skin is warm and dry.     Capillary Refill: Capillary refill takes less than 2 seconds.  Neurological:     Mental Status: She is alert.  Psychiatric:        Mood and Affect: Mood normal.    ED Results / Procedures / Treatments   Labs (all labs ordered are listed, but only abnormal results are displayed) Labs Reviewed - No data to display  EKG None  Radiology DG Knee Complete 4 Views Left  Result Date: 05/11/2021 CLINICAL DATA:  fall EXAM: LEFT KNEE - COMPLETE 4+ VIEW COMPARISON:  None. FINDINGS: Normal alignment. No acute fracture. Mild degenerative changes of the medial joint compartment. Normal mineralization. The  soft tissues are unremarkable. No joint effusion. IMPRESSION: No malalignment or acute fracture. Mild degenerative changes of the medial joint compartment. Electronically Signed   By: Erin [name] M.D.   On: 05/11/2021 10:56   DG Knee Complete 4 Views Right  Result Date: 05/11/2021 CLINICAL DATA:  fall EXAM: RIGHT KNEE - COMPLETE 4+ VIEW COMPARISON:  None. FINDINGS: Normal alignment. No acute fracture. Patellar enthesopathy. Normal mineralization. Anterior to the patella, there is a punctate (approximately 2 mm) radiopaque object. No joint effusion. IMPRESSION: There is a punctate 2 mm radiopaque object anterior to the patella, which may represent imbedded foreign body in the soft tissues. Correlate clinically. No malalignment or acute fracture. Electronically Signed   By: Erin [name] M.D.   On: 05/11/2021 11:01    Procedures Procedures   Medications Ordered in ED Medications  acetaminophen (TYLENOL) tablet 650 mg (650 mg Oral Given 05/11/21 1047)    ED Course  I have reviewed the triage vital signs and the nursing notes.  Pertinent labs & imaging results that were available during my care of the patient were reviewed by me and considered in my medical decision making (see chart for details).  Clinical Course as of 05/11/21 1210  Thu Nov 17, 719  [id number] 55 year old female here for evaluation of bilateral knee pain after a fall.  She denies any head injury or LOC.  She has no evidence of deformities on exam, no tibial plateau tenderness.  She does not have any skin abrasions.  She does have some mild generalized tenderness throughout, range of motion is fully intact though with some pain.  Plan for further evaluation with knee x-rays.  Suspect this will be normal, discussed adding Voltaren gel.  Will provide bilateral knee sleeves per request of the patient.  Encouraged PCP follow-up. [MB]  1126 Plain films of the left knee without any abnormalities.  Right knee x-ray with small radiopaque  to 2 mm object, however no evidence of skin abrasion to the right knee.  Discussed Voltaren gel and PCP follow-up. Discussed return precautions.  Patient is agreeable to this.  Stable for discharge. [MB]    Clinical Course User Index [MB] Lyndel Safe   MDM Rules/Calculators/A&P                            Final Clinical Impression(s) / ED Diagnoses Final diagnoses:  Fall, initial encounter  Acute pain of both knees    Rx / DC Orders ED Discharge Orders     None        Garald Balding, PA-C 05/11/21 1210    Lorelle Gibbs, DO 05/11/21 1545

## 2021-05-11 NOTE — ED Triage Notes (Signed)
States she tripped over a curb yesterday and fell onto right knee. C/o bilateral knee pain. Denies hitting head/LOC.

## 2021-05-11 NOTE — Discharge Instructions (Signed)
You were evaluated in the Emergency Department and after careful evaluation, we did not find any emergent condition requiring admission or further testing in the hospital.  X-rays today were reassuring.  Please use Voltaren gel, continue taking meloxicam.  Use the knee braces for pain relief.  Please follow-up with your primary care doctor if her symptoms continue.  Please return to the Emergency Department if you experience any worsening of your condition.  Thank you for allowing [place] to be a part of your care.

## 2021-05-16 ENCOUNTER — Encounter: Payer: Self-pay | Admitting: Internal Medicine

## 2021-05-16 ENCOUNTER — Other Ambulatory Visit (HOSPITAL_COMMUNITY): Payer: Self-pay

## 2021-05-16 NOTE — Patient Instructions (Addendum)
    Medications changes include :   mounjaro 2.5 mg weekly  Your prescription(s) have been submitted to your pharmacy. Please take as directed and contact our office if you believe you are having problem(s) with the medication(s).    Please followup in 3 months

## 2021-05-16 NOTE — Progress Notes (Signed)
    Subjective:    Patient ID: Erin [name], female    DOB: 07/14/1965, 55 y.o.   MRN: [id number]  This visit occurred during the SARS-CoV-2 public health emergency.  Safety protocols were in place, including screening questions prior to the visit, additional usage of staff PPE, and extensive cleaning of exam room while observing appropriate contact time as indicated for disinfecting solutions.     HPI The patient is here for follow up of their chronic medical problems, including DM, htn, hld, hypothyroid, lumbar radiculopathy, gerd, anxiety   A1c today 11.9.  she is taking a new supplement and her sugars have been a little low - in the 200's  Medications and allergies reviewed with patient and updated if appropriate.  Patient Active Problem List   Diagnosis Date Noted   Chronic left hip pain 10/04/2020   Spasm of muscle of lower back 06/08/2020   Perimenopausal symptoms 09/16/2019   De Quervain's tenosynovitis, right 06/16/2019   Patellofemoral arthritis of left knee 11/28/2018   Left knee pain 11/24/2018   Episodic lightheadedness 10/24/2018   Metrorrhagia 10/24/2018   Lumbar back pain with radiculopathy affecting left lower extremity 10/07/2018   Right ovarian cyst    OSA (obstructive sleep apnea), severe 06/23/2018   Anxiety 03/05/2018   Chronic nonintractable headache 03/05/2018   Hyperlipidemia 01/23/2018   Elevated liver enzymes    Diabetes (HCC) 01/16/2018   DKA (diabetic ketoacidoses) 01/15/2018   Gastroesophageal reflux disease 10/05/2017   Temporal arteritis (HCC) 10/03/2017   Right foot pain 07/15/2017   Grief at loss of child 11/02/2016   Hypertension 09/28/2015   Morbid obesity (HCC) 05/17/2015   Umbilical hernia 05/17/2015   Chronic lower back pain 05/17/2015   Palpitations 05/17/2015   Iron deficiency anemia 09/03/2007   Hypothyroidism following radioiodine therapy 09/02/2007   INTERSTITIAL CYSTITIS 09/01/2007   ALLERGIC RHINITIS 08/07/2007   DISC  DISEASE, LUMBAR 08/07/2007    Current Outpatient Medications on File Prior to Visit  Medication Sig Dispense Refill   ALPRAZolam (XANAX) 0.5 MG tablet Take 1 tablet (0.5 mg total) by mouth 2 (two) times daily as needed for anxiety. 60 tablet 0   blood glucose meter kit and supplies KIT Dispense based on patient and insurance preference. Use  daily as directd. (FOR E11.65). 1 each 0   cholecalciferol (VITAMIN D) 1000 units tablet Take 1,000 Units by mouth daily.     clotrimazole (LOTRIMIN) 1 % cream Apply 1 application topically 2 (two) times daily. Use for 2 weeks at a time as needed. 30 g 2   famotidine (PEPCID) 20 MG tablet Take 1 tablet (20 mg total) by mouth 2 (two) times daily. 180 tablet 3   levonorgestrel (MIRENA) 20 MCG/24HR IUD 1 each by Intrauterine route once.      losartan (COZAAR) 50 MG tablet Take 1 tablet (50 mg total) by mouth daily. 90 tablet 1   meloxicam (MOBIC) 15 MG tablet TAKE 1 TABLET (15 MG TOTAL) BY MOUTH DAILY. 30 tablet 3   methocarbamol (ROBAXIN) 750 MG tablet Take 1 tablet (750 mg total) by mouth 4 (four) times daily. 120 tablet 2   Multiple Vitamin (MULTIVITAMIN) tablet Take 1 tablet by mouth daily.     TIADYLT ER 120 MG 24 hr capsule TAKE 1 CAPSULE BY MOUTH EVERY DAY 90 capsule 3   UNITHROID 137 MCG tablet Take 1 tablet (137 mcg total) by mouth daily before breakfast. 45 tablet 3   No current facility-administered medications on file   prior to visit.    Past Medical History:  Diagnosis Date   Abnormal uterine bleeding    Anemia    COVID 12/2018   Depression    Diabetes mellitus without complication (HCC)    gestational   Elevated blood-pressure reading without diagnosis of hypertension    Endometriosis    Fibroids    GERD (gastroesophageal reflux disease)    Graves disease    Grief at loss of child    H/O gestational diabetes mellitus, not currently pregnant 09/28/2015   Hypertension    Hypothyroidism    Interstitial cystitis    Menorrhagia     Oropharyngeal candidiasis 01/16/2018   OSA (obstructive sleep apnea), severe 06/23/2018   Premature ventricular contractions    a. rare PVC by monitor 12/2016.   Right ovarian cyst    3 cm.   Thyroid disease    Uterine leiomyoma     Past Surgical History:  Procedure Laterality Date   APPENDECTOMY     ARTERY BIOPSY Left 10/25/2017   Procedure: BIOPSY TEMPORAL ARTERY;  Surgeon: Aviva Signs, MD;  Location: [facility];  Service: General;  Laterality: Left;   CESAREAN SECTION     CHOLECYSTECTOMY     HERNIA REPAIR     incisional   TUMOR REMOVAL  fibroids    Social History   Socioeconomic History   Marital status: Married    Spouse name: Not on file   Number of children: Not on file   Years of education: Not on file   Highest education level: Not on file  Occupational History   Not on file  Tobacco Use   Smoking status: Never   Smokeless tobacco: Never  Vaping Use   Vaping Use: Never used  Substance and Sexual Activity   Alcohol use: No   Drug use: No   Sexual activity: Never    Birth control/protection: I.U.D.    Comment: Mirena inserted 2015/2016  Other Topics Concern   Not on file  Social History Narrative   Not on file   Social Determinants of Health   Financial Resource Strain: Not on file  Food Insecurity: Not on file  Transportation Needs: Not on file  Physical Activity: Not on file  Stress: Not on file  Social Connections: Not on file    Family History  Problem Relation Age of Onset   Hypertension Mother    Cancer Mother    Breast cancer Mother 19   Hypertension Father    Diabetes Father    Cancer Father    Ovarian cancer Maternal Aunt    Thyroid disease Maternal Aunt        hypothyroid    Review of Systems  Constitutional:  Negative for chills and fever.  Eyes:  Negative for visual disturbance.  Respiratory:  Negative for cough, shortness of breath and wheezing.   Cardiovascular:  Positive for palpitations (occ - not as frequent). Negative for  chest pain and leg swelling.  Endocrine: Positive for polydipsia and polyuria.  Musculoskeletal:  Positive for arthralgias (knees).  Neurological:  Positive for headaches. Negative for light-headedness.      Objective:   Vitals:   05/17/21 1459  BP: 136/82  Pulse: 85  Temp: 98.8 F (37.1 C)  SpO2: 98%   BP Readings from Last 3 Encounters:  05/17/21 136/82  05/11/21 (!) 155/95  04/05/21 (!) 136/92   Wt Readings from Last 3 Encounters:  05/17/21 (!) 356 lb (161.5 kg)  05/11/21 (!) 357 lb (161.9 kg)  04/05/21 (!) 359 lb (162.8 kg)   Body mass index is 59.24 kg/m.   Physical Exam    Constitutional: Appears well-developed and well-nourished. No distress.  HENT:  Head: Normocephalic and atraumatic.  Neck: Neck supple. No tracheal deviation present. No thyromegaly present.  No cervical lymphadenopathy Cardiovascular: Normal rate, regular rhythm and normal heart sounds.   No murmur heard. No carotid bruit .  No edema Pulmonary/Chest: Effort normal and breath sounds normal. No respiratory distress. No has no wheezes. No rales.  Skin: Skin is warm and dry. Not diaphoretic.  Psychiatric: Normal mood and affect. Behavior is normal.      Assessment & Plan:    See Problem List for Assessment and Plan of chronic medical problems.     

## 2021-05-17 ENCOUNTER — Ambulatory Visit: Payer: 59 | Admitting: Internal Medicine

## 2021-05-17 ENCOUNTER — Other Ambulatory Visit: Payer: Self-pay

## 2021-05-17 VITALS — BP 136/82 | HR 85 | Temp 98.8°F | Ht 65.0 in | Wt 356.0 lb

## 2021-05-17 DIAGNOSIS — F419 Anxiety disorder, unspecified: Secondary | ICD-10-CM

## 2021-05-17 DIAGNOSIS — I10 Essential (primary) hypertension: Secondary | ICD-10-CM

## 2021-05-17 DIAGNOSIS — E782 Mixed hyperlipidemia: Secondary | ICD-10-CM | POA: Diagnosis not present

## 2021-05-17 DIAGNOSIS — E1165 Type 2 diabetes mellitus with hyperglycemia: Secondary | ICD-10-CM

## 2021-05-17 MED ORDER — TIRZEPATIDE 2.5 MG/0.5ML ~~LOC~~ SOAJ: 2.5000 mg | SUBCUTANEOUS | 0 refills | Status: DC

## 2021-05-17 NOTE — Assessment & Plan Note (Signed)
Chronic Poorly controlled A1c here today 11.9 She does have polyuria, polydipsia, headaches Stressed that we need to get her on a medication to help with her sugar Trial of mounjaro  2.5 mg weekly-I did print out a savings card for her and hopefully this will be covered Encouraged regular exercise-she did recently join the gym Diabetic diet If tolerated will increase the dose of her medication after 1 month Follow-up in 3 months

## 2021-05-17 NOTE — Assessment & Plan Note (Signed)
Chronic Intermittent-situational Continue alprazolam 0.25 mg twice daily as needed-has not needed to take this, but okay to have if needed

## 2021-05-17 NOTE — Assessment & Plan Note (Addendum)
Chronic Blood pressure well controlled Continue losartan 50 mg daily, tiadylt ER 120 mg daily

## 2021-05-17 NOTE — Assessment & Plan Note (Signed)
Chronic Stressed regular exercise, diabetic diet Will be starting mounjaro if covered for her diabetes which will assist with weight loss Follow-up in 3 months

## 2021-05-17 NOTE — Assessment & Plan Note (Signed)
Chronic Not currently on a statin.-She has been resistant to be on a statin Will start new diabetic medication and consider statin in 3 months

## 2021-05-24 ENCOUNTER — Other Ambulatory Visit: Payer: Self-pay

## 2021-05-24 ENCOUNTER — Other Ambulatory Visit (INDEPENDENT_AMBULATORY_CARE_PROVIDER_SITE_OTHER): Payer: 59

## 2021-05-24 DIAGNOSIS — E890 Postprocedural hypothyroidism: Secondary | ICD-10-CM | POA: Diagnosis not present

## 2021-05-24 LAB — T4, FREE: Free T4: 0.82 ng/dL (ref 0.60–1.60)

## 2021-05-24 LAB — TSH: TSH: 5.67 u[IU]/mL — ABNORMAL HIGH (ref 0.35–5.50)

## 2021-05-25 MED ORDER — UNITHROID 150 MCG PO TABS
150.0000 ug | ORAL_TABLET | Freq: Every day | ORAL | 3 refills | Status: DC
  Filled 2021-06-30 – 2021-07-03 (×2): qty 45, 45d supply, fill #0
  Filled 2021-08-14: qty 45, 45d supply, fill #1
  Filled 2021-10-02: qty 45, 45d supply, fill #2

## 2021-06-01 ENCOUNTER — Encounter (INDEPENDENT_AMBULATORY_CARE_PROVIDER_SITE_OTHER): Payer: 59 | Admitting: Ophthalmology

## 2021-06-02 ENCOUNTER — Encounter: Payer: Self-pay | Admitting: Internal Medicine

## 2021-06-13 MED ORDER — TRULICITY 0.75 MG/0.5ML ~~LOC~~ SOAJ
0.7500 mg | SUBCUTANEOUS | 2 refills | Status: DC
  Filled 2021-07-03 – 2021-07-05 (×3): qty 2, 28d supply, fill #0

## 2021-06-13 NOTE — Addendum Note (Signed)
Addended by: Binnie Rail on: 06/13/2021 04:49 PM   Modules accepted: Orders

## 2021-06-15 ENCOUNTER — Other Ambulatory Visit (HOSPITAL_COMMUNITY): Payer: Self-pay

## 2021-06-15 MED ORDER — DILTIAZEM HCL ER BEADS 120 MG PO CP24
120.0000 mg | ORAL_CAPSULE | Freq: Every day | ORAL | 3 refills | Status: DC
  Filled 2021-06-15: qty 90, 90d supply, fill #0

## 2021-06-15 MED FILL — Diltiazem HCl Extended Release Beads Cap ER 24HR 120 MG: ORAL | 90 days supply | Qty: 90 | Fill #0 | Status: AC

## 2021-06-16 ENCOUNTER — Other Ambulatory Visit (HOSPITAL_COMMUNITY): Payer: Self-pay

## 2021-06-20 ENCOUNTER — Encounter: Payer: Self-pay | Admitting: Gastroenterology

## 2021-06-23 ENCOUNTER — Encounter: Payer: Self-pay | Admitting: Internal Medicine

## 2021-06-27 ENCOUNTER — Encounter: Payer: Self-pay | Admitting: Internal Medicine

## 2021-06-30 ENCOUNTER — Telehealth: Payer: Self-pay | Admitting: *Deleted

## 2021-06-30 ENCOUNTER — Other Ambulatory Visit: Payer: Self-pay

## 2021-06-30 ENCOUNTER — Other Ambulatory Visit: Payer: Self-pay | Admitting: Internal Medicine

## 2021-06-30 ENCOUNTER — Other Ambulatory Visit (HOSPITAL_COMMUNITY): Payer: Self-pay

## 2021-06-30 MED ORDER — METHOCARBAMOL 750 MG PO TABS
750.0000 mg | ORAL_TABLET | Freq: Four times a day (QID) | ORAL | 2 refills | Status: DC
Start: 2021-06-30 — End: 2022-08-13
  Filled 2021-06-30 – 2021-10-20 (×3): qty 120, 30d supply, fill #0
  Filled 2022-01-09: qty 120, 30d supply, fill #1
  Filled 2022-04-30: qty 120, 30d supply, fill #2

## 2021-06-30 MED FILL — Meloxicam Tab 15 MG: ORAL | 30 days supply | Qty: 30 | Fill #0 | Status: CN

## 2021-06-30 NOTE — Progress Notes (Signed)
Error

## 2021-06-30 NOTE — Telephone Encounter (Signed)
From Feb 2022 OV w/ Dr. Tarri Glenn:  PLAN: - Iron, ferritin - Screening colonoscopy at the hospital - Consider EGD if iron deficiency is documented - Continue famotidine 20 mg BID - Reviewed dietary and lifestyle recommendations to minimize nausea and reflux - Working to achieve a health weight may improve her nausea/reflux  Uncertain if Dr. Tarri Glenn is wanting to see pt in office to eval iron def anemia AND to determine if EGD is appropriate in addition to colonoscopy. Appears recommendation was for pt to have repeat Iron and Ferritin in 3-6 mo (would've been due ~ May - Aug) which was not completed. Routing this message to Dr. Tarri Glenn to determine if she would like to see pt in the office for further f/u on iron def anemia OR if she would prefer pt to be scheduled for hosp EGD/colon. Will hold off on rescheduling [place] colon to hosp until Dr. Tarri Glenn has responded with appropriate treatment plan.

## 2021-06-30 NOTE — Telephone Encounter (Signed)
Erin [name],  This pt has had 2 OV's with Dr Tarri Glenn ,2020 and 08-12-2020- BOTH notes state pt needs a hospital colon case- pt has been scheduled in the [facility] for a colon- she has a BMI of 59.24  Can you please schedule her at [facility] for a colon  I have canceled  her [place] colon  Thanks  Lelan Pons PV

## 2021-07-03 ENCOUNTER — Encounter: Payer: Self-pay | Admitting: Internal Medicine

## 2021-07-03 ENCOUNTER — Other Ambulatory Visit (HOSPITAL_COMMUNITY): Payer: Self-pay

## 2021-07-03 ENCOUNTER — Other Ambulatory Visit: Payer: Self-pay

## 2021-07-03 DIAGNOSIS — D508 Other iron deficiency anemias: Secondary | ICD-10-CM

## 2021-07-03 NOTE — Telephone Encounter (Signed)
Called pt to advise about the need to cancel procedure d/t elevated BMI and about need to reschedule to [facility]. PV also canceled at this time. Informed pt about need to complete labs. States she will complete labs today. Prefers to wait until results of labs have been received BEFORE rescheduling procedure(s). Pt aware that the procedure could not be rescheduled until Mar 2023. Per pt request, procedures NOT rescheduled at this time. Will await lab results, then reschedule procedures based on her results. Reminder created to f/u.

## 2021-07-04 ENCOUNTER — Other Ambulatory Visit (HOSPITAL_COMMUNITY): Payer: Self-pay

## 2021-07-05 ENCOUNTER — Encounter: Payer: Self-pay | Admitting: Internal Medicine

## 2021-07-05 ENCOUNTER — Other Ambulatory Visit (HOSPITAL_COMMUNITY): Payer: Self-pay

## 2021-07-05 DIAGNOSIS — N3010 Interstitial cystitis (chronic) without hematuria: Secondary | ICD-10-CM

## 2021-07-09 ENCOUNTER — Other Ambulatory Visit: Payer: Self-pay | Admitting: Internal Medicine

## 2021-07-10 ENCOUNTER — Other Ambulatory Visit (HOSPITAL_COMMUNITY): Payer: Self-pay

## 2021-07-12 ENCOUNTER — Encounter: Payer: Self-pay | Admitting: Internal Medicine

## 2021-07-12 ENCOUNTER — Other Ambulatory Visit: Payer: Self-pay | Admitting: Internal Medicine

## 2021-07-12 ENCOUNTER — Other Ambulatory Visit (HOSPITAL_COMMUNITY): Payer: Self-pay

## 2021-07-12 ENCOUNTER — Encounter: Payer: Self-pay | Admitting: Gastroenterology

## 2021-07-12 ENCOUNTER — Other Ambulatory Visit (INDEPENDENT_AMBULATORY_CARE_PROVIDER_SITE_OTHER): Payer: 59

## 2021-07-12 DIAGNOSIS — D508 Other iron deficiency anemias: Secondary | ICD-10-CM | POA: Diagnosis not present

## 2021-07-12 DIAGNOSIS — E890 Postprocedural hypothyroidism: Secondary | ICD-10-CM

## 2021-07-12 LAB — CBC
HCT: 36.6 % (ref 36.0–46.0)
Hemoglobin: 11.7 g/dL — ABNORMAL LOW (ref 12.0–15.0)
MCHC: 32.1 g/dL (ref 30.0–36.0)
MCV: 84 fl (ref 78.0–100.0)
Platelets: 348 10*3/uL (ref 150.0–400.0)
RBC: 4.36 Mil/uL (ref 3.87–5.11)
RDW: 13.7 % (ref 11.5–15.5)
WBC: 11.2 10*3/uL — ABNORMAL HIGH (ref 4.0–10.5)

## 2021-07-12 LAB — IBC + FERRITIN
Ferritin: 55.5 ng/mL (ref 10.0–291.0)
Iron: 50 ug/dL (ref 42–145)
Saturation Ratios: 13.8 % — ABNORMAL LOW (ref 20.0–50.0)
TIBC: 361.2 ug/dL (ref 250.0–450.0)
Transferrin: 258 mg/dL (ref 212.0–360.0)

## 2021-07-12 LAB — TSH: TSH: 3.39 u[IU]/mL (ref 0.35–5.50)

## 2021-07-12 LAB — T4, FREE: Free T4: 0.96 ng/dL (ref 0.60–1.60)

## 2021-07-12 LAB — IRON: Iron: 50 ug/dL (ref 42–145)

## 2021-07-12 MED FILL — Meloxicam Tab 15 MG: ORAL | 30 days supply | Qty: 30 | Fill #0 | Status: AC

## 2021-07-12 NOTE — Telephone Encounter (Signed)
Following letter has been mailed as a reminder for pt to complete labs:  [facility] [address] [place], [place]  [address] Phone:  [phone]   Fax:  [phone]   07/12/2021 MRN : [id number]     SHARONE PICCHI [address] [place] [address]     Dear Ms. Owens Shark,    According to our records, you are overdue for completion of your labs. At this time, we are unable to proceed with scheduling your procedure without these results. Please complete these labs as soon as you are able.   LABS:    Please proceed to the basement level of our office to have your lab work completed. Press "B" on the elevator. The lab is located at the first door on the left as you exit the elevator.   HEALTHCARE LAWS AND MY CHART RESULTS:    Due to recent changes in healthcare laws, you may see results of your imaging and/or laboratory studies on MyChart before I have had a chance to review them.  I understand that in some cases there may be results that are confusing or concerning to you. Please understand that not all results are received at the same time and often I may need to interpret multiple results in order to provide you with the best plan of care or course of treatment. Therefore, I ask that you please give me 48 hours to thoroughly review all your results before contacting my office for clarification.    If you have any questions or concerns, please call the office at [phone].     Sincerely,   Thornton Park, MD, MPH [facility] [phone]

## 2021-07-13 ENCOUNTER — Other Ambulatory Visit: Payer: Self-pay

## 2021-07-13 ENCOUNTER — Other Ambulatory Visit: Payer: Self-pay | Admitting: Internal Medicine

## 2021-07-13 ENCOUNTER — Other Ambulatory Visit: Payer: 59 | Admitting: Obstetrics and Gynecology

## 2021-07-13 ENCOUNTER — Other Ambulatory Visit (HOSPITAL_COMMUNITY): Payer: Self-pay

## 2021-07-13 ENCOUNTER — Other Ambulatory Visit: Payer: 59

## 2021-07-13 DIAGNOSIS — Z1211 Encounter for screening for malignant neoplasm of colon: Secondary | ICD-10-CM

## 2021-07-13 MED ORDER — TRULICITY 0.75 MG/0.5ML ~~LOC~~ SOAJ
0.7500 mg | SUBCUTANEOUS | 2 refills | Status: DC
  Filled 2021-07-13: qty 2, 28d supply, fill #0
  Filled 2021-08-17: qty 4, 56d supply, fill #1

## 2021-07-13 MED ORDER — TRULICITY 0.75 MG/0.5ML ~~LOC~~ SOAJ: 0.7500 mg | SUBCUTANEOUS | 2 refills | Status: DC

## 2021-07-14 ENCOUNTER — Other Ambulatory Visit (HOSPITAL_COMMUNITY): Payer: Self-pay

## 2021-07-18 ENCOUNTER — Telehealth: Payer: Self-pay | Admitting: Gastroenterology

## 2021-07-18 NOTE — Telephone Encounter (Signed)
Inbound call from patient requesting to reschedule procedure please to a later date and time.

## 2021-07-18 NOTE — Telephone Encounter (Signed)
Returned pt call. States she is wanting to cancel procedure d/t date/time does not work for her. Wants to wait until later to schedule. Advised we do not have any availabilities at this time in which to reschedule procedure. Will proceed with cancelling procedure as she has requested. Will place her on a wait list. When April availabilities become available for scheduling, we will call to reschedule procedure. Verbalized acceptance and understanding.

## 2021-07-20 ENCOUNTER — Other Ambulatory Visit (HOSPITAL_COMMUNITY): Payer: Self-pay

## 2021-07-27 ENCOUNTER — Encounter: Payer: 59 | Admitting: Gastroenterology

## 2021-08-01 DIAGNOSIS — G4731 Primary central sleep apnea: Secondary | ICD-10-CM | POA: Diagnosis not present

## 2021-08-01 DIAGNOSIS — G4733 Obstructive sleep apnea (adult) (pediatric): Secondary | ICD-10-CM | POA: Diagnosis not present

## 2021-08-02 ENCOUNTER — Encounter (HOSPITAL_BASED_OUTPATIENT_CLINIC_OR_DEPARTMENT_OTHER): Payer: Self-pay

## 2021-08-02 ENCOUNTER — Other Ambulatory Visit: Payer: Self-pay

## 2021-08-02 ENCOUNTER — Emergency Department (HOSPITAL_BASED_OUTPATIENT_CLINIC_OR_DEPARTMENT_OTHER)
Admission: EM | Admit: 2021-08-02 | Discharge: 2021-08-02 | Disposition: A | Discharge status: Home / Self Care | Payer: 59 | Attending: Emergency Medicine | Admitting: Emergency Medicine

## 2021-08-02 DIAGNOSIS — Z20822 Contact with and (suspected) exposure to covid-19: Secondary | ICD-10-CM | POA: Insufficient documentation

## 2021-08-02 DIAGNOSIS — R519 Headache, unspecified: Secondary | ICD-10-CM | POA: Insufficient documentation

## 2021-08-02 DIAGNOSIS — R5381 Other malaise: Secondary | ICD-10-CM | POA: Diagnosis not present

## 2021-08-02 DIAGNOSIS — R6889 Other general symptoms and signs: Secondary | ICD-10-CM | POA: Insufficient documentation

## 2021-08-02 LAB — BASIC METABOLIC PANEL
Anion gap: 9 (ref 5–15)
BUN: 14 mg/dL (ref 6–20)
CO2: 25 mmol/L (ref 22–32)
Calcium: 9.2 mg/dL (ref 8.9–10.3)
Chloride: 103 mmol/L (ref 98–111)
Creatinine, Ser: 0.68 mg/dL (ref 0.44–1.00)
GFR, Estimated: >60 mL/min (ref >60)
Glucose, Bld: 161 mg/dL — ABNORMAL HIGH (ref 70–99)
Potassium: 4 mmol/L (ref 3.5–5.1)
Sodium: 137 mmol/L (ref 135–145)

## 2021-08-02 LAB — CBC WITH DIFFERENTIAL/PLATELET
Abs Immature Granulocytes: 0.05 10*3/uL (ref 0.00–0.07)
Basophils Absolute: 0.1 10*3/uL (ref 0.0–0.1)
Basophils Relative: 1 %
Eosinophils Absolute: 0.1 10*3/uL (ref 0.0–0.5)
Eosinophils Relative: 1 %
HCT: 37.2 % (ref 36.0–46.0)
Hemoglobin: 11.9 g/dL — ABNORMAL LOW (ref 12.0–15.0)
Immature Granulocytes: 1 %
Lymphocytes Relative: 43 %
Lymphs Abs: 4.5 10*3/uL — ABNORMAL HIGH (ref 0.7–4.0)
MCH: 27.6 pg (ref 26.0–34.0)
MCHC: 32 g/dL (ref 30.0–36.0)
MCV: 86.3 fL (ref 80.0–100.0)
Monocytes Absolute: 0.6 10*3/uL (ref 0.1–1.0)
Monocytes Relative: 5 %
Neutro Abs: 5.2 10*3/uL (ref 1.7–7.7)
Neutrophils Relative %: 49 %
Platelets: 337 10*3/uL (ref 150–400)
RBC: 4.31 MIL/uL (ref 3.87–5.11)
RDW: 13.4 % (ref 11.5–15.5)
WBC: 10.5 10*3/uL (ref 4.0–10.5)
nRBC: 0 % (ref 0.0–0.2)

## 2021-08-02 LAB — RESP PANEL BY RT-PCR (FLU A&B, COVID) ARPGX2
Influenza A by PCR: NEGATIVE
Influenza B by PCR: NEGATIVE
SARS Coronavirus 2 by RT PCR: NEGATIVE

## 2021-08-02 MED ORDER — SODIUM CHLORIDE 0.9 % IV BOLUS
1000.0000 mL | Freq: Once | INTRAVENOUS | Status: AC
  Administered 2021-08-02: 1000 mL via INTRAVENOUS

## 2021-08-02 NOTE — Discharge Instructions (Signed)
Your lab work here was unremarkable.  Your COVID and flu testing was negative.  Please follow-up with your family doctor in the office.

## 2021-08-02 NOTE — ED Provider Notes (Signed)
[facility] Provider Note   CSN: [id number] Arrival date & time: 08/02/21  1128     History  Chief Complaint  Patient presents with   Headache    Erin [name] is a 56 y.o. female.  56 yo F with a chief complaint of not feeling right.  She tells me that her head to her pelvis felt like the pressure was too high.  She tried to check her blood pressure at home but unfortunately her cuff was not working.  She took a home dose of her blood pressure medication and felt a little bit better.  This occurred first thing when she woke up.  Has been having symptoms going on for the past few days.  She denies cough congestion or fever.  Denies decreased oral intake.  Denies any new medicines or supplements.  Denies chest pain or pressure.  Had a little bit of a headache this morning but is completely resolved.   Headache     Home Medications Prior to Admission medications   Medication Sig Start Date End Date Taking? Authorizing Provider  ALPRAZolam Duanne Moron) 0.5 MG tablet Take 1 tablet (0.5 mg total) by mouth 2 (two) times daily as needed for anxiety. 02/01/21   Binnie Rail, MD  blood glucose meter kit and supplies KIT Dispense based on patient and insurance preference. Use  daily as directd. (FOR E11.65). 03/29/21   Binnie Rail, MD  cholecalciferol (VITAMIN D) 1000 units tablet Take 1,000 Units by mouth daily.    [provider]  clotrimazole (LOTRIMIN) 1 % cream Apply 1 application topically 2 (two) times daily. Use for 2 weeks at a time as needed. 10/05/20   Nunzio Cobbs, MD  Dulaglutide (TRULICITY) 7.12 WP/8.0DX SOPN Inject 0.75 mg into the skin once a week. 07/13/21   Binnie Rail, MD  famotidine (PEPCID) 20 MG tablet Take 1 tablet (20 mg total) by mouth 2 (two) times daily. 08/12/20   Thornton Park, MD  levonorgestrel (MIRENA) 20 MCG/24HR IUD 1 each by Intrauterine route once.  06/26/15   [provider]  losartan (COZAAR)  50 MG tablet Take 1 tablet (50 mg total) by mouth daily. 05/08/21   Binnie Rail, MD  meloxicam (MOBIC) 15 MG tablet Take 1 tablet (15 mg total) by mouth daily. 04/11/21   Binnie Rail, MD  methocarbamol (ROBAXIN) 750 MG tablet Take 1 tablet (750 mg total) by mouth 4 (four) times daily. 06/30/21   Binnie Rail, MD  Multiple Vitamin (MULTIVITAMIN) tablet Take 1 tablet by mouth daily.    [provider]  diltiazem (TIADYLT ER) 120 MG 24 hr capsule Take 1 capsule (120 mg total) by mouth daily. 01/19/21   Binnie Rail, MD  TRULICITY 8.33 AS/5.0NL SOPN INJECT 0.75 MG INTO THE SKIN ONCE A WEEK. 07/10/21   Burns, Claudina Lick, MD  UNITHROID 150 MCG tablet Take 1 tablet (150 mcg total) by mouth daily. 05/25/21   Philemon Kingdom, MD      Allergies    Prednisone, Glimepiride, Prozac [fluoxetine hcl], Hydrocodone, and Metformin and related    Review of Systems   Review of Systems  Neurological:  Positive for headaches.   Physical Exam Updated Vital Signs BP (!) 147/94 (BP Location: Left Arm)    Pulse 84    Temp 99.2 F (37.3 C) (Oral)    Resp 20    Wt (!) 161.9 kg    LMP  (Approximate)  SpO2 99%    BMI 59.41 kg/m  Physical Exam Vitals and nursing note reviewed.  Constitutional:      General: She is not in acute distress.    Appearance: She is well-developed. She is not diaphoretic.  HENT:     Head: Normocephalic and atraumatic.     Comments: Swollen turbinates, posterior nasal drip, no noted sinus ttp, tm normal bilaterally.   Eyes:     Pupils: Pupils are equal, round, and reactive to light.  Cardiovascular:     Rate and Rhythm: Normal rate and regular rhythm.     Heart sounds: No murmur heard.   No friction rub. No gallop.  Pulmonary:     Effort: Pulmonary effort is normal.     Breath sounds: No wheezing or rales.  Abdominal:     General: There is no distension.     Palpations: Abdomen is soft.     Tenderness: There is no abdominal tenderness.  Musculoskeletal:         General: No tenderness.     Cervical back: Normal range of motion and neck supple.  Skin:    General: Skin is warm and dry.  Neurological:     Mental Status: She is alert and oriented to person, place, and time.     GCS: GCS eye subscore is 4. GCS verbal subscore is 5. GCS motor subscore is 6.     Cranial Nerves: Cranial nerves 2-12 are intact.     Sensory: Sensation is intact.     Motor: Motor function is intact.     Coordination: Coordination is intact.  Psychiatric:        Behavior: Behavior normal.    ED Results / Procedures / Treatments   Labs (all labs ordered are listed, but only abnormal results are displayed) Labs Reviewed  CBC WITH DIFFERENTIAL/PLATELET - Abnormal; Notable for the following components:      Result Value   Hemoglobin 11.9 (*)    Lymphs Abs 4.5 (*)    All other components within normal limits  BASIC METABOLIC PANEL - Abnormal; Notable for the following components:   Glucose, Bld 161 (*)    All other components within normal limits  RESP PANEL BY RT-PCR (FLU A&B, COVID) ARPGX2    EKG None  Radiology No results found.  Procedures Procedures    Medications Ordered in ED Medications  sodium chloride 0.9 % bolus 1,000 mL (1,000 mLs Intravenous New Bag/Given 08/02/21 1441)    ED Course/ Medical Decision Making/ A&P                           Medical Decision Making Amount and/or Complexity of Data Reviewed Labs: ordered.   Patient is a 56 y.o. female with a cc of not feeling well.  She has difficulty describing her symptoms and so not sure what exactly she means but tells me that she did not feel well and like she had pressure from her head to her pelvis.  This has been going on for a few days.  It usually is worse first thing in the morning and then improves throughout the day.  She has benign neurologic exam.  She has some signs of URI on exam though denies obvious congestion cough or fever.  COVID and flu testing is negative.  We will give a  bolus of IV fluids that she felt like she might be dehydrated check basic blood work.  No leukocytosis, no significant  anemia.  No significant electrolyte abnormality.  Renal function appears to be at baseline.  Will discharge patient home.  PCP follow-up.  3:06 PM:  I have discussed the diagnosis/risks/treatment options with the patient.  Evaluation and diagnostic testing in the emergency department does not suggest an emergent condition requiring admission or immediate intervention beyond what has been performed at this time.  They will follow up with  PCP. We also discussed returning to the ED immediately if new or worsening sx occur. We discussed the sx which are most concerning (e.g., sudden worsening pain, fever, inability to tolerate by mouth) that necessitate immediate return. Medications administered to the patient during their visit and any new prescriptions provided to the patient are listed below.  Medications given during this visit Medications  sodium chloride 0.9 % bolus 1,000 mL (1,000 mLs Intravenous New Bag/Given 08/02/21 1441)     The patient appears reasonably screen and/or stabilized for discharge and I doubt any other medical condition or other [facility] requiring further screening, evaluation, or treatment in the ED at this time prior to discharge.          Final Clinical Impression(s) / ED Diagnoses Final diagnoses:  None    Rx / DC Orders ED Discharge Orders     None         Deno Etienne, DO 08/02/21 1506

## 2021-08-02 NOTE — ED Triage Notes (Signed)
Pt reports she woke up with slight headache, pressure behind eyes and felt disoriented. Blood sugar was 166. Thought it might be her BP or she is dehydrated.no vomiting reported

## 2021-08-03 ENCOUNTER — Encounter: Payer: Self-pay | Admitting: Adult Health

## 2021-08-03 ENCOUNTER — Ambulatory Visit (INDEPENDENT_AMBULATORY_CARE_PROVIDER_SITE_OTHER): Payer: 59 | Admitting: Adult Health

## 2021-08-03 VITALS — BP 122/74 | HR 94 | Ht 65.0 in | Wt 354.0 lb

## 2021-08-03 DIAGNOSIS — Z9989 Dependence on other enabling machines and devices: Secondary | ICD-10-CM | POA: Diagnosis not present

## 2021-08-03 DIAGNOSIS — G4733 Obstructive sleep apnea (adult) (pediatric): Secondary | ICD-10-CM

## 2021-08-03 NOTE — Patient Instructions (Signed)
Continue using CPAP nightly and greater than 4 hours each night Will decrease pressure 7-13 If your symptoms worsen or you develop new symptoms please let us know.

## 2021-08-03 NOTE — Progress Notes (Signed)
PATIENT: Erin [name] DOB: Aug 03, 1965  REASON FOR VISIT: follow up HISTORY FROM: patient  HISTORY OF PRESENT ILLNESS: Today 08/03/21:  Erin [name] is a 56 year old female with a history of obstructive sleep apnea on CPAP.  Reports that the CPAP is working for her.  She would like for her pressure to be lower.  She states that she is working with her primary care for her headaches.  States that they have improved slightly since starting CPAP therapy feels that her headaches may be related to eye pressure.  Returns today for an evaluation.    08/03/20: Erin [name] is a 56 year old female with a history of obstructive sleep apnea on CPAP.  She reports that the CPAP is working well.  She denies any new issues.  She returns today for an evaluation.   Compliance Report Usage 07/04/2020 - 08/02/2020 Usage days 30/30 days (100%) >= 4 hours 30 days (100%) Average usage (days used) 8 hours 10 minutes  AirSense 10 AutoSet Serial number [id number] Mode AutoSet Min Pressure 7 cmH2O Max Pressure 14 cmH2O EPR Fulltime EPR level 2 Response Standard  Therapy Pressure - cmH2O Median: 9.6 95th percentile: 12.2 Maximum: 13.3 Leaks - L/min Median: 0.4 95th percentile: 11.8 Maximum: 37.6 Events per hour AI: 0.1 HI: 0.0 AHI: 0.1 Apnea Index Central: 0.0 Obstructive: 0.0 Unknown: 0.0  REVIEW OF SYSTEMS: Out of a complete 14 system review of symptoms, the patient complains only of the following symptoms, and all other reviewed systems are negative.  ESS 11 FSS 44  ALLERGIES: Allergies  Allergen Reactions   Prednisone Other (See Comments)    Raises blood sugar so high that she receives intensive care   Glimepiride     Stomach upset   Prozac [Fluoxetine Hcl] Nausea Only   Hydrocodone Hives, Itching and Rash    On thighs    Metformin And Related     diarrhea    HOME MEDICATIONS: Outpatient Medications Prior to Visit  Medication Sig Dispense Refill   ALPRAZolam (XANAX) 0.5 MG tablet  Take 1 tablet (0.5 mg total) by mouth 2 (two) times daily as needed for anxiety. 60 tablet 0   blood glucose meter kit and supplies KIT Dispense based on patient and insurance preference. Use  daily as directd. (FOR E11.65). 1 each 0   cholecalciferol (VITAMIN D) 1000 units tablet Take 1,000 Units by mouth daily.     clotrimazole (LOTRIMIN) 1 % cream Apply 1 application topically 2 (two) times daily. Use for 2 weeks at a time as needed. 30 g 2   diltiazem (TIADYLT ER) 120 MG 24 hr capsule Take 1 capsule (120 mg total) by mouth daily. 90 capsule 3   Dulaglutide (TRULICITY) 0.35 WS/5.6CL SOPN Inject 0.75 mg into the skin once a week. 2 mL 2   famotidine (PEPCID) 20 MG tablet Take 1 tablet (20 mg total) by mouth 2 (two) times daily. 180 tablet 3   levonorgestrel (MIRENA) 20 MCG/24HR IUD 1 each by Intrauterine route once.      losartan (COZAAR) 50 MG tablet Take 1 tablet (50 mg total) by mouth daily. 90 tablet 1   meloxicam (MOBIC) 15 MG tablet Take 1 tablet (15 mg total) by mouth daily. 30 tablet 3   methocarbamol (ROBAXIN) 750 MG tablet Take 1 tablet (750 mg total) by mouth 4 (four) times daily. 120 tablet 2   Multiple Vitamin (MULTIVITAMIN) tablet Take 1 tablet by mouth daily.     TRULICITY 2.75 TZ/0.0FV SOPN INJECT 0.75  MG INTO THE SKIN ONCE A WEEK. 0.5 mL 3   UNITHROID 150 MCG tablet Take 1 tablet (150 mcg total) by mouth daily. 45 tablet 3   No facility-administered medications prior to visit.    PAST MEDICAL HISTORY: Past Medical History:  Diagnosis Date   Abnormal uterine bleeding    Anemia    COVID 12/2018   Depression    Diabetes mellitus without complication (HCC)    gestational   Elevated blood-pressure reading without diagnosis of hypertension    Endometriosis    Fibroids    GERD (gastroesophageal reflux disease)    Graves disease    Grief at loss of child    H/O gestational diabetes mellitus, not currently pregnant 09/28/2015   Hypertension    Hypothyroidism     Interstitial cystitis    Menorrhagia    Oropharyngeal candidiasis 01/16/2018   OSA (obstructive sleep apnea), severe 06/23/2018   Premature ventricular contractions    a. rare PVC by monitor 12/2016.   Right ovarian cyst    3 cm.   Thyroid disease    Uterine leiomyoma     PAST SURGICAL HISTORY: Past Surgical History:  Procedure Laterality Date   APPENDECTOMY     ARTERY BIOPSY Left 10/25/2017   Procedure: BIOPSY TEMPORAL ARTERY;  Surgeon: Aviva Signs, MD;  Location: [facility];  Service: General;  Laterality: Left;   CESAREAN SECTION     CHOLECYSTECTOMY     HERNIA REPAIR     incisional   TUMOR REMOVAL  fibroids    FAMILY HISTORY: Family History  Problem Relation Age of Onset   Hypertension Mother    Cancer Mother    Breast cancer Mother 64   Hypertension Father    Diabetes Father    Cancer Father    Ovarian cancer Maternal Aunt    Thyroid disease Maternal Aunt        hypothyroid    SOCIAL HISTORY: Social History   Socioeconomic History   Marital status: Married    Spouse name: Not on file   Number of children: Not on file   Years of education: Not on file   Highest education level: Not on file  Occupational History   Not on file  Tobacco Use   Smoking status: Never   Smokeless tobacco: Never  Vaping Use   Vaping Use: Never used  Substance and Sexual Activity   Alcohol use: No   Drug use: No   Sexual activity: Never    Birth control/protection: I.U.D.    Comment: Mirena inserted 2015/2016  Other Topics Concern   Not on file  Social History Narrative   Not on file   Social Determinants of Health   Financial Resource Strain: Not on file  Food Insecurity: Not on file  Transportation Needs: Not on file  Physical Activity: Not on file  Stress: Not on file  Social Connections: Not on file  Intimate Partner Violence: Not on file      PHYSICAL EXAM  Vitals:   08/03/21 1436  BP: 122/74  Pulse: 94  SpO2: 97%  Weight: (!) 354 lb (160.6 kg)  Height:  5' 5"  (1.651 m)   Body mass index is 58.91 kg/m.  Generalized: Well developed, in no acute distress  Chest: Lungs clear to auscultation bilaterally  Neurological examination  Mentation: Alert oriented to time, place, history taking. Follows all commands speech and language fluent Cranial nerve II-XII: Extraocular movements were full, visual field were full on confrontational test Head turning and  shoulder shrug  were normal and symmetric. Motor: The motor testing reveals 5 over 5 strength of all 4 extremities. Good symmetric motor tone is noted throughout.  Sensory: Sensory testing is intact to soft touch on all 4 extremities. No evidence of extinction is noted.  Gait and station: Gait is normal.    DIAGNOSTIC DATA (LABS, IMAGING, TESTING) - I reviewed patient records, labs, notes, testing and imaging myself where available.  Lab Results  Component Value Date   WBC 10.5 08/02/2021   HGB 11.9 (L) 08/02/2021   HCT 37.2 08/02/2021   MCV 86.3 08/02/2021   PLT 337 08/02/2021      Component Value Date/Time   NA 137 08/02/2021 1442   NA 140 12/05/2016 1616   K 4.0 08/02/2021 1442   CL 103 08/02/2021 1442   CO2 25 08/02/2021 1442   GLUCOSE 161 (H) 08/02/2021 1442   BUN 14 08/02/2021 1442   BUN 12 12/05/2016 1616   CREATININE 0.68 08/02/2021 1442   CREATININE 0.82 01/14/2019 0943   CALCIUM 9.2 08/02/2021 1442   PROT 7.7 02/01/2021 1439   ALBUMIN 4.1 02/01/2021 1439   AST 19 02/01/2021 1439   AST 14 (L) 01/14/2019 0943   ALT 31 02/01/2021 1439   ALT 19 01/14/2019 0943   ALKPHOS 184 (H) 02/01/2021 1439   BILITOT 0.7 02/01/2021 1439   BILITOT 0.5 01/14/2019 0943   GFRNONAA >60 08/02/2021 1442   GFRNONAA >60 01/14/2019 0943   GFRAA >60 01/14/2019 0943   Lab Results  Component Value Date   CHOL 227 (H) 02/01/2021   HDL 40.40 02/01/2021   LDLCALC 156 (H) 02/01/2021   TRIG 151.0 (H) 02/01/2021   CHOLHDL 6 02/01/2021   Lab Results  Component Value Date   HGBA1C 9.6 (H)  02/01/2021   Lab Results  Component Value Date   VITAMINB12 705 01/14/2019   Lab Results  Component Value Date   TSH 3.39 07/12/2021      ASSESSMENT AND PLAN 56 y.o. year old female  has a past medical history of Abnormal uterine bleeding, Anemia, COVID (12/2018), Depression, Diabetes mellitus without complication ([place]), Elevated blood-pressure reading without diagnosis of hypertension, Endometriosis, Fibroids, GERD (gastroesophageal reflux disease), Graves disease, Grief at loss of child, H/O gestational diabetes mellitus, not currently pregnant (09/28/2015), Hypertension, Hypothyroidism, Interstitial cystitis, Menorrhagia, Oropharyngeal candidiasis (01/16/2018), OSA (obstructive sleep apnea), severe (06/23/2018), Premature ventricular contractions, Right ovarian cyst, Thyroid disease, and Uterine leiomyoma. here with:  OSA on CPAP  - CPAP compliance excellent - Good treatment of AHI  -We will decrease pressure 7-13 cmH2O - Encourage patient to use CPAP nightly and > 4 hours each night - F/U in 1 year or sooner if needed    Ward Givens, MSN, NP-C 08/03/2021, 2:43 PM [facility] [address], [place], [place] [address] [phone]

## 2021-08-04 ENCOUNTER — Other Ambulatory Visit: Payer: Self-pay

## 2021-08-04 ENCOUNTER — Telehealth: Payer: Self-pay

## 2021-08-04 DIAGNOSIS — Z1211 Encounter for screening for malignant neoplasm of colon: Secondary | ICD-10-CM

## 2021-08-04 NOTE — Telephone Encounter (Signed)
Called pt to inform about newly scheduled appts for hosp screening colon. LVM requesting returned call. PV appt reminder printed and mailed to pt home address.

## 2021-08-08 NOTE — Telephone Encounter (Signed)
SECOND ATTEMPT:  Called pt to inform about newly scheduled appt for screening colon and PV appt. Phone rang x3, then beeped repeatedly.

## 2021-08-09 ENCOUNTER — Encounter: Payer: Self-pay | Admitting: Internal Medicine

## 2021-08-09 NOTE — Telephone Encounter (Signed)
THIRD ATTEMPT:  Called pt to inform about PV appt and new date for screening colonoscopy. Phone rang x3, then beeped repeatedly.

## 2021-08-10 NOTE — Telephone Encounter (Signed)
FINAL ATTEMPT:  Called pt to inform about date/time of PV appt and date/time/location of screening colon. Phone rang x3, then beeped repeatedly.

## 2021-08-14 ENCOUNTER — Other Ambulatory Visit (HOSPITAL_COMMUNITY): Payer: Self-pay

## 2021-08-14 NOTE — Progress Notes (Signed)
Denyse Amass, RN; Vanessa Ralphs got it      Previous Messages   ----- Message -----  From: Brandon Melnick, RN  Sent: 08/08/2021   4:15 PM EST  To: Ocie Bob, *   New order in [facility]  Female, 56 y.o., [birth year]  Pronouns:  she/her/hers  MRN:  [id number]    Thanks ,  Lovey Newcomer RN

## 2021-08-15 DIAGNOSIS — Z20822 Contact with and (suspected) exposure to covid-19: Secondary | ICD-10-CM | POA: Diagnosis not present

## 2021-08-16 ENCOUNTER — Encounter: Payer: Self-pay | Admitting: Internal Medicine

## 2021-08-17 ENCOUNTER — Other Ambulatory Visit (HOSPITAL_COMMUNITY): Payer: Self-pay

## 2021-08-18 ENCOUNTER — Ambulatory Visit: Payer: 59 | Admitting: Internal Medicine

## 2021-08-29 ENCOUNTER — Encounter (HOSPITAL_COMMUNITY): Payer: Self-pay

## 2021-08-29 ENCOUNTER — Ambulatory Visit (HOSPITAL_COMMUNITY): Admit: 2021-08-29 | Payer: 59 | Admitting: Gastroenterology

## 2021-08-29 SURGERY — COLONOSCOPY WITH PROPOFOL
Anesthesia: Monitor Anesthesia Care

## 2021-09-12 ENCOUNTER — Other Ambulatory Visit (HOSPITAL_COMMUNITY): Payer: Self-pay

## 2021-09-12 MED FILL — Diltiazem HCl Extended Release Beads Cap ER 24HR 120 MG: ORAL | 90 days supply | Qty: 90 | Fill #1 | Status: AC

## 2021-09-14 ENCOUNTER — Telehealth: Payer: Self-pay | Admitting: Gastroenterology

## 2021-09-14 NOTE — Telephone Encounter (Signed)
Inbound call from patient stating that she needs to reschedule her PV call and procedure on 4/27 at [facility] due to a family emergency. I have already canceled PV. Patient is requesting a call back to reschedule  procedure. Please advise.  ?

## 2021-09-14 NOTE — Telephone Encounter (Signed)
Appears pt also canceled 3/7 hosp procedure with no reason provided. Routing this message to Dr. Tarri Glenn to make her aware of 2nd cancellation for hosp procedure. Will await her response re: if she would like pt to be rescheduled for a 3rd time. ?

## 2021-09-15 ENCOUNTER — Encounter: Payer: Self-pay | Admitting: Gastroenterology

## 2021-09-15 ENCOUNTER — Other Ambulatory Visit: Payer: Self-pay

## 2021-09-15 DIAGNOSIS — Z1211 Encounter for screening for malignant neoplasm of colon: Secondary | ICD-10-CM

## 2021-09-15 NOTE — Telephone Encounter (Signed)
Colon has been rescheduled @ [facility] on 12/19/21 @ 830am, arrival time 7am. Updated prep instructions sent BOTH via My Chart and mailed. Amb referral placed for auth purposes. ?

## 2021-09-24 ENCOUNTER — Encounter: Payer: Self-pay | Admitting: Internal Medicine

## 2021-09-24 NOTE — Patient Instructions (Addendum)
? ? ? ?  Blood work was ordered.   ? ? ?Medications changes include :  increase trulicity to 1.5 mg weekly.  Start crestor 10 mg daily ? ? ?Your prescription(s) have been sent to your pharmacy.  ? ? ? ? ?Return in about 3 months (around 12/25/2021) for follow up. ? ?

## 2021-09-24 NOTE — Progress Notes (Signed)
? ? ? ? ?Subjective:  ? ? Patient ID: Erin [name], female    DOB: 08-31-1965, 56 y.o.   MRN: [id number] ? ?This visit occurred during the SARS-CoV-2 public health emergency.  Safety protocols were in place, including screening questions prior to the visit, additional usage of staff PPE, and extensive cleaning of exam room while observing appropriate contact time as indicated for disinfecting solutions.   ? ? ?HPI ?Erin [name] is here for follow up of her chronic medical problems, including DM, htn, hld, hypothyroid, lumbar radiculopathy, gerd, anxiety ? ? ?Trulicity causes some nausea.  She has not lost much weight.  She is not exercising regularly.   ? 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 ? ?Headaches - come and go - may last one hour, sharp, sometimes in moves.  Tender not tender.  Occ pain in back of neck - that can be painful.  She has seen neurology. ? ?Medications and allergies reviewed with patient and updated if appropriate. ? ?Current Outpatient Medications on File Prior to Visit  ?Medication Sig Dispense Refill  ? ALPRAZolam (XANAX) 0.5 MG tablet Take 1 tablet (0.5 mg total) by mouth 2 (two) times daily as needed for anxiety. 60 tablet 0  ? blood glucose meter kit and supplies KIT Dispense based on patient and insurance preference. Use  daily as directd. (FOR E11.65). 1 each 0  ? cholecalciferol (VITAMIN D) 1000 units tablet Take 1,000 Units by mouth daily.    ? clotrimazole (LOTRIMIN) 1 % cream Apply 1 application topically 2 (two) times daily. Use for 2 weeks at a time as needed. 30 g 2  ? diltiazem (TIADYLT ER) 120 MG 24 hr capsule Take 1 capsule (120 mg total) by mouth daily. 90 capsule 3  ? famotidine (PEPCID) 20 MG tablet Take 1 tablet (20 mg total) by mouth 2 (two) times daily. 180 tablet 3  ? levonorgestrel (MIRENA) 20 MCG/24HR IUD 1 each by Intrauterine route once.     ? losartan (COZAAR) 50 MG tablet Take 1 tablet (50 mg total) by mouth  daily. 90 tablet 1  ? meloxicam (MOBIC) 15 MG tablet Take 1 tablet (15 mg total) by mouth daily. 30 tablet 3  ? methocarbamol (ROBAXIN) 750 MG tablet Take 1 tablet (750 mg total) by mouth 4 (four) times daily. 120 tablet 2  ? Multiple Vitamin (MULTIVITAMIN) tablet Take 1 tablet by mouth daily.    ? UNITHROID 150 MCG tablet Take 1 tablet (150 mcg total) by mouth daily. 45 tablet 3  ? ?No current facility-administered medications on file prior to visit.  ? ? ? ?Review of Systems  ?Constitutional:  Positive for diaphoresis (at night). Negative for chills and fever.  ?Respiratory:  Positive for shortness of breath (moderate exertion). Negative for cough and wheezing.   ?Cardiovascular:  Positive for palpitations (not as often - usually when walking) and leg swelling (left lower leg). Negative for chest pain.  ?Neurological:  Positive for headaches. Negative for light-headedness.  ? ?   ?Objective:  ? ?Vitals:  ? 09/25/21 1522  ?BP: 134/84  ?Pulse: 80  ?Temp: 98.4 ?F (36.9 ?C)  ?SpO2: 98%  ? ?BP Readings from Last 3 Encounters:  ?09/25/21 134/84  ?08/03/21 122/74  ?08/02/21 (!) 145/90  ? ?Wt Readings from Last 3 Encounters:  ?09/25/21 (!) 355 lb (161 kg)  ?08/03/21 (!) 354 lb (160.6 kg)  ?08/02/21 (!) 357 lb (161.9 kg)  ? ?Body  mass index is 59.08 kg/m?. ? ?  ?Physical Exam ?Constitutional:   ?   General: She is not in acute distress. ?   Appearance: Normal appearance.  ?HENT:  ?   Head: Normocephalic and atraumatic.  ?Eyes:  ?   Conjunctiva/sclera: Conjunctivae normal.  ?Cardiovascular:  ?   Rate and Rhythm: Normal rate and regular rhythm.  ?   Heart sounds: Normal heart sounds. No murmur heard. ?Pulmonary:  ?   Effort: Pulmonary effort is normal. No respiratory distress.  ?   Breath sounds: Normal breath sounds. No wheezing.  ?Musculoskeletal:  ?   Cervical back: Neck supple.  ?   Right lower leg: No edema.  ?   Left lower leg: No edema.  ?Lymphadenopathy:  ?   Cervical: No cervical adenopathy.  ?Skin: ?   Findings: No  rash.  ?Neurological:  ?   Mental Status: She is alert. Mental status is at baseline.  ?Psychiatric:     ?   Mood and Affect: Mood normal.     ?   Behavior: Behavior normal.  ? ?   ? ?Lab Results  ?Component Value Date  ? WBC 10.5 08/02/2021  ? HGB 11.9 (L) 08/02/2021  ? HCT 37.2 08/02/2021  ? PLT 337 08/02/2021  ? GLUCOSE 161 (H) 08/02/2021  ? CHOL 227 (H) 02/01/2021  ? TRIG 151.0 (H) 02/01/2021  ? HDL 40.40 02/01/2021  ? LDLCALC 156 (H) 02/01/2021  ? ALT 31 02/01/2021  ? AST 19 02/01/2021  ? NA 137 08/02/2021  ? K 4.0 08/02/2021  ? CL 103 08/02/2021  ? CREATININE 0.68 08/02/2021  ? BUN 14 08/02/2021  ? CO2 25 08/02/2021  ? TSH 3.39 07/12/2021  ? HGBA1C 9.6 (H) 02/01/2021  ? ? ? ?Assessment & Plan:  ? ? ?See Problem List for Assessment and Plan of chronic medical problems.  ? ? ?

## 2021-09-25 ENCOUNTER — Ambulatory Visit: Payer: 59 | Admitting: Internal Medicine

## 2021-09-25 ENCOUNTER — Other Ambulatory Visit (HOSPITAL_COMMUNITY): Payer: Self-pay

## 2021-09-25 VITALS — BP 134/84 | HR 80 | Temp 98.4°F | Ht 65.0 in | Wt 355.0 lb

## 2021-09-25 DIAGNOSIS — F419 Anxiety disorder, unspecified: Secondary | ICD-10-CM

## 2021-09-25 DIAGNOSIS — K219 Gastro-esophageal reflux disease without esophagitis: Secondary | ICD-10-CM | POA: Diagnosis not present

## 2021-09-25 DIAGNOSIS — I10 Essential (primary) hypertension: Secondary | ICD-10-CM | POA: Diagnosis not present

## 2021-09-25 DIAGNOSIS — M5416 Radiculopathy, lumbar region: Secondary | ICD-10-CM

## 2021-09-25 DIAGNOSIS — Z111 Encounter for screening for respiratory tuberculosis: Secondary | ICD-10-CM

## 2021-09-25 DIAGNOSIS — E782 Mixed hyperlipidemia: Secondary | ICD-10-CM | POA: Diagnosis not present

## 2021-09-25 DIAGNOSIS — E1165 Type 2 diabetes mellitus with hyperglycemia: Secondary | ICD-10-CM | POA: Diagnosis not present

## 2021-09-25 DIAGNOSIS — E890 Postprocedural hypothyroidism: Secondary | ICD-10-CM

## 2021-09-25 LAB — LIPID PANEL
Cholesterol: 235 mg/dL — ABNORMAL HIGH (ref 0–200)
HDL: 40 mg/dL (ref >39.00)
LDL Cholesterol: 159 mg/dL — ABNORMAL HIGH (ref 0–99)
NonHDL: 194.64
Total CHOL/HDL Ratio: 6
Triglycerides: 179 mg/dL — ABNORMAL HIGH (ref 0.0–149.0)
VLDL: 35.8 mg/dL (ref 0.0–40.0)

## 2021-09-25 LAB — COMPREHENSIVE METABOLIC PANEL
ALT: 21 U/L (ref 0–35)
AST: 18 U/L (ref 0–37)
Albumin: 4.3 g/dL (ref 3.5–5.2)
Alkaline Phosphatase: 166 U/L — ABNORMAL HIGH (ref 39–117)
BUN: 15 mg/dL (ref 6–23)
CO2: 28 mEq/L (ref 19–32)
Calcium: 9.6 mg/dL (ref 8.4–10.5)
Chloride: 103 mEq/L (ref 96–112)
Creatinine, Ser: 0.75 mg/dL (ref 0.40–1.20)
GFR: 89.78 mL/min (ref >60.00)
Glucose, Bld: 192 mg/dL — ABNORMAL HIGH (ref 70–99)
Potassium: 3.7 mEq/L (ref 3.5–5.1)
Sodium: 139 mEq/L (ref 135–145)
Total Bilirubin: 0.7 mg/dL (ref 0.2–1.2)
Total Protein: 7.8 g/dL (ref 6.0–8.3)

## 2021-09-25 LAB — HEMOGLOBIN A1C: Hgb A1c MFr Bld: 9.8 % — ABNORMAL HIGH (ref 4.6–6.5)

## 2021-09-25 MED ORDER — ROSUVASTATIN CALCIUM 10 MG PO TABS
10.0000 mg | ORAL_TABLET | Freq: Every day | ORAL | 3 refills | Status: DC
  Filled 2021-09-25: qty 90, 90d supply, fill #0
  Filled 2021-12-22: qty 90, 90d supply, fill #1
  Filled 2022-03-29: qty 90, 90d supply, fill #2
  Filled 2022-06-26: qty 90, 90d supply, fill #3

## 2021-09-25 MED ORDER — ONDANSETRON HCL 4 MG PO TABS
4.0000 mg | ORAL_TABLET | Freq: Three times a day (TID) | ORAL | 3 refills | Status: DC | PRN
  Filled 2021-09-25: qty 30, 10d supply, fill #0

## 2021-09-25 MED ORDER — TRULICITY 1.5 MG/0.5ML ~~LOC~~ SOAJ
1.5000 mg | SUBCUTANEOUS | 5 refills | Status: DC
Start: 2021-09-25 — End: 2021-11-01
  Filled 2021-09-25: qty 2, 28d supply, fill #0

## 2021-09-25 MED FILL — Meloxicam Tab 15 MG: ORAL | 30 days supply | Qty: 30 | Fill #1 | Status: AC

## 2021-09-25 NOTE — Assessment & Plan Note (Signed)
Chronic Management per endocrine 

## 2021-09-25 NOTE — Assessment & Plan Note (Signed)
Chronic GERD controlled Continue famotidine 20 mg twice daily 

## 2021-09-25 NOTE — Assessment & Plan Note (Signed)
Chronic ?BP well controlled ?Continue diltiazem 120 mg ER daily,losartan 50 mg daily ?cmp ? ?

## 2021-09-25 NOTE — Assessment & Plan Note (Signed)
Chronic ?Start Crestor 10 mg daily ?Check lipid panel today ?Recheck lipid panel, hepatic function in about 6 weeks ?Stressed regular exercise, healthy diet and weight loss ?

## 2021-09-25 NOTE — Assessment & Plan Note (Signed)
Chronic ?Intermittent symptoms ?Overall controlled ?Continue meloxicam 15 mg daily as needed ?Continue methocarbamol 750 mg 4 times daily as needed only ?

## 2021-09-25 NOTE — Assessment & Plan Note (Signed)
Chronic ?Intermittent ?Continue alprazolam 0.5 mg twice daily as needed ?

## 2021-09-25 NOTE — Assessment & Plan Note (Signed)
Chronic ?Lab Results  ?Component Value Date  ? HGBA1C 9.6 (H) 02/01/2021  ? ?Sugars not controlled, but sugars better at home recently-still high-about 190s ?Check A1c, urine microalbumin today ?Continue Trulicity, but increased to 1.5 mg weekly-hopefully she will tolerate this.  She is experiencing some nausea ?Stressed regular exercise, diabetic diet ? ? ?

## 2021-09-26 LAB — MICROALBUMIN / CREATININE URINE RATIO
Creatinine,U: 126 mg/dL
Microalb Creat Ratio: 0.7 mg/g (ref 0.0–30.0)
Microalb, Ur: 0.8 mg/dL (ref 0.0–1.9)

## 2021-09-28 LAB — QUANTIFERON-TB GOLD PLUS
Mitogen-NIL: >10 IU/mL
NIL: 0.05 IU/mL
QuantiFERON-TB Gold Plus: NEGATIVE
TB1-NIL: 0.22 IU/mL
TB2-NIL: 0.19 IU/mL

## 2021-10-02 ENCOUNTER — Other Ambulatory Visit (HOSPITAL_COMMUNITY): Payer: Self-pay

## 2021-10-03 ENCOUNTER — Other Ambulatory Visit (HOSPITAL_COMMUNITY): Payer: Self-pay

## 2021-10-05 ENCOUNTER — Ambulatory Visit (INDEPENDENT_AMBULATORY_CARE_PROVIDER_SITE_OTHER): Payer: 59 | Admitting: Internal Medicine

## 2021-10-05 ENCOUNTER — Encounter: Payer: Self-pay | Admitting: Internal Medicine

## 2021-10-05 VITALS — BP 130/88 | HR 88 | Ht 65.0 in | Wt 358.8 lb

## 2021-10-05 DIAGNOSIS — E890 Postprocedural hypothyroidism: Secondary | ICD-10-CM

## 2021-10-05 DIAGNOSIS — E1165 Type 2 diabetes mellitus with hyperglycemia: Secondary | ICD-10-CM

## 2021-10-05 DIAGNOSIS — Z8639 Personal history of other endocrine, nutritional and metabolic disease: Secondary | ICD-10-CM

## 2021-10-05 LAB — T4, FREE: Free T4: 0.92 ng/dL (ref 0.60–1.60)

## 2021-10-05 LAB — TSH: TSH: 3.27 u[IU]/mL (ref 0.35–5.50)

## 2021-10-05 NOTE — Patient Instructions (Signed)
Please continue Levothyroxine 150 mcg daily.  Take the thyroid hormone every day, with water, at least 30 minutes before breakfast, separated by at least 4 hours from: - acid reflux medications - calcium - iron - multivitamins  Please stop at the lab.  Please come back for a follow-up appointment in 6 months. 

## 2021-10-05 NOTE — Progress Notes (Signed)
Patient ID: Erin [name], female   DOB: Aug 28, 1965, 56 y.o.   MRN: [id number]  ? ?This visit occurred during the SARS-CoV-2 public health emergency.  Safety protocols were in place, including screening questions prior to the visit, additional usage of staff PPE, and extensive cleaning of exam room while observing appropriate contact time as indicated for disinfecting solutions.  ? ?HPI  ?EMMALEA [name] is a 56 y.o.-year-old female, initially referred by her PCP, Dr. Quay Burow, presenting for follow-up for postablative hypothyroidism.  She previously saw Dr. Dwyane Dee.  She is not usually compliant with the recommended appointment intervals.  Last visit with me 6 months ago.  Previously, 10 months prior.  Previous visit 1 year and 2 months prior. ? ?Interim history: ?No increased urination, blurry vision, nausea, chest pain. ?She does mention occasional dysphagia with drier foods. ?She started Trulicity 1 mo ago - still on the 0.75 mg >> tolerated well. Some nausea - occas. Zofran. ?She also started Crestor 10 mg daily. ? ?Reviewed history: ?Pt. has been dx with hypothyroidism after RAI treatment for Graves' disease in 08/2008 >> on Unithroid d.a.w.. She had a lot of variability in the TFTs on generic LT4.  In the past she was not taking the medication correctly and we discussed about how to do so. ? ?At last visit, TSH was elevated so I advised her to increase her LT4 dose.  However, she did not return for repeat labs afterwards and, upon questioning, at last visit, she was still on Unithroid 100 mcg daily... ? ?We increased the dose to 112 mcg daily in 05/2020. ? ? We increased the dose to 137 mcg daily in 03/2021. ? ?We increased the dose to 150 mcg daily in 04/2021. ? ?She is taking this: ?- in am ?- fasting ?- at least 30 min from b'fast ?- no calcium ?- no iron ?- no multivitamins ?- no PPIs, on H2 blocker  - not lately ?- stopped Biotin ? ?Reviewed her TFTs: ?Lab Results  ?Component Value Date  ? TSH 3.39 07/12/2021   ? TSH 5.67 (H) 05/24/2021  ? TSH 11.52 (H) 04/05/2021  ? TSH 5.98 (H) 06/22/2020  ? TSH 6.72 (H) 06/08/2020  ? TSH 8.72 (H) 10/24/2018  ? TSH 1.34 05/16/2018  ? TSH 0.43 03/13/2018  ? TSH 1.06 01/29/2018  ? TSH 0.288 (L) 01/15/2018  ? FREET4 0.96 07/12/2021  ? FREET4 0.82 05/24/2021  ? FREET4 0.79 04/05/2021  ? FREET4 0.87 06/22/2020  ? FREET4 0.83 05/16/2018  ? FREET4 1.14 11/27/2017  ? FREET4 1.11 08/27/2017  ? FREET4 0.67 06/27/2017  ? FREET4 1.17 10/29/2016  ? FREET4 0.72 02/14/2016  ? T3FREE 6.7 (H) 09/04/2007  ? ?Antithyroid antibodies: ?No results found for: THGAB ?No components found for: TPOAB ? ?Pt denies: ?- feeling nodules in neck ?- hoarseness ?- choking ?- SOB with lying down ?She has  dysphagia with dry foods.  ? ?She has + FH of thyroid disorders in: mother, M aunts. No FH of thyroid cancer. No h/o radiation tx to head or neck. ? ?No herbal supplements but she plans to start curcumin. No Biotin use. No recent steroids use.  ? ?She also has uncontrolled diabetes, with DKA in 12/2017. ?She previously wanted me to also start managing this, but she was lost to follow-up for me afterwards.  This is is currently managed by PCP. ? ?She recently started Trulicity, currently at 0.75 5 mg daily.  She tolerates this well, with only slight nausea. ?Amaryl caused stomach  discomfort.   ? ?Reviewed HbA1c levels: ?Lab Results  ?Component Value Date  ? HGBA1C 9.8 (H) 09/25/2021  ? HGBA1C 9.6 (H) 02/01/2021  ? HGBA1C 8.6 (H) 06/08/2020  ? HGBA1C 7.9 (H) 09/16/2019  ? HGBA1C 7.2 (H) 02/03/2019  ? HGBA1C 6.9 (H) 10/24/2018  ? HGBA1C 6.2 (A) 08/29/2018  ? HGBA1C 10.3 (H) 03/13/2018  ? HGBA1C 12.7 (H) 01/15/2018  ? HGBA1C 8.9 (H) 12/13/2017  ? ?No CKD: ?Lab Results  ?Component Value Date  ? BUN 15 09/25/2021  ? ?Lab Results  ?Component Value Date  ? CREATININE 0.75 09/25/2021  ?On Cozaar 50. ? ?+ HL: ?Lab Results  ?Component Value Date  ? CHOL 235 (H) 09/25/2021  ? HDL 40.00 09/25/2021  ? LDLCALC 159 (H) 09/25/2021  ?  TRIG 179.0 (H) 09/25/2021  ? CHOLHDL 6 09/25/2021  ?She was not on a statin - but started Crestor 10 mg daily after the above results returned. ? ?Last eye exam: 01/2021: No DR-Dr. Sarajane Marek ? ?She was on high doses on Prednisone (60 mg) in the past, then decreased.  Now off prednisone. ? ?ROS: ?Signs see HPI ? ?I reviewed pt's medications, allergies, PMH, social hx, family hx, and changes were documented in the history of present illness. Otherwise, unchanged from my initial visit note. ? ?Past Medical History:  ?Diagnosis Date  ? Abnormal uterine bleeding   ? Anemia   ? COVID 12/2018  ? Depression   ? Diabetes mellitus without complication ([place])   ? gestational  ? Elevated blood-pressure reading without diagnosis of hypertension   ? Endometriosis   ? Fibroids   ? GERD (gastroesophageal reflux disease)   ? Graves disease   ? Grief at loss of child   ? H/O gestational diabetes mellitus, not currently pregnant 09/28/2015  ? Hypertension   ? Hypothyroidism   ? Interstitial cystitis   ? Menorrhagia   ? Oropharyngeal candidiasis 01/16/2018  ? OSA (obstructive sleep apnea), severe 06/23/2018  ? Premature ventricular contractions   ? a. rare PVC by monitor 12/2016.  ? Right ovarian cyst   ? 3 cm.  ? Thyroid disease   ? Uterine leiomyoma   ? ?Past Surgical History:  ?Procedure Laterality Date  ? APPENDECTOMY    ? ARTERY BIOPSY Left 10/25/2017  ? Procedure: BIOPSY TEMPORAL ARTERY;  Surgeon: Aviva Signs, MD;  Location: [facility];  Service: General;  Laterality: Left;  ? CESAREAN SECTION    ? CHOLECYSTECTOMY    ? HERNIA REPAIR    ? incisional  ? TUMOR REMOVAL  fibroids  ? ?Social History  ? ?Socioeconomic History  ? Marital status: Married  ?  Spouse name: Not on file  ? Number of children: Not on file  ? Years of education: Not on file  ? Highest education level: Not on file  ?Occupational History  ? Not on file  ?Tobacco Use  ? Smoking status: Never  ? Smokeless tobacco: Never  ?Vaping Use  ? Vaping Use: Never used   ?Substance and Sexual Activity  ? Alcohol use: No  ? Drug use: No  ? Sexual activity: Never  ?  Birth control/protection: I.U.D.  ?  Comment: Mirena inserted 2015/2016  ?Other Topics Concern  ? Not on file  ?Social History Narrative  ? Not on file  ? ?Social Determinants of Health  ? ?Financial Resource Strain: Not on file  ?Food Insecurity: Not on file  ?Transportation Needs: Not on file  ?Physical Activity: Not on file  ?Stress: Not  on file  ?Social Connections: Not on file  ?Intimate Partner Violence: Not on file  ? ?Current Outpatient Medications on File Prior to Visit  ?Medication Sig Dispense Refill  ? ALPRAZolam (XANAX) 0.5 MG tablet Take 1 tablet (0.5 mg total) by mouth 2 (two) times daily as needed for anxiety. 60 tablet 0  ? blood glucose meter kit and supplies KIT Dispense based on patient and insurance preference. Use  daily as directd. (FOR E11.65). 1 each 0  ? cholecalciferol (VITAMIN D) 1000 units tablet Take 1,000 Units by mouth daily.    ? clotrimazole (LOTRIMIN) 1 % cream Apply 1 application topically 2 (two) times daily. Use for 2 weeks at a time as needed. 30 g 2  ? diltiazem (TIADYLT ER) 120 MG 24 hr capsule Take 1 capsule (120 mg total) by mouth daily. 90 capsule 3  ? Dulaglutide (TRULICITY) 1.5 OE/3.2ZY SOPN Inject 1.5 mg into the skin once a week. 2 mL 5  ? famotidine (PEPCID) 20 MG tablet Take 1 tablet (20 mg total) by mouth 2 (two) times daily. 180 tablet 3  ? levonorgestrel (MIRENA) 20 MCG/24HR IUD 1 each by Intrauterine route once.     ? losartan (COZAAR) 50 MG tablet Take 1 tablet (50 mg total) by mouth daily. 90 tablet 1  ? meloxicam (MOBIC) 15 MG tablet Take 1 tablet (15 mg total) by mouth daily. 30 tablet 3  ? methocarbamol (ROBAXIN) 750 MG tablet Take 1 tablet (750 mg total) by mouth 4 (four) times daily. 120 tablet 2  ? Multiple Vitamin (MULTIVITAMIN) tablet Take 1 tablet by mouth daily.    ? ondansetron (ZOFRAN) 4 MG tablet Take 1 tablet (4 mg total) by mouth every 8 (eight) hours  as needed for nausea or vomiting. 30 tablet 3  ? rosuvastatin (CRESTOR) 10 MG tablet Take 1 tablet (10 mg total) by mouth daily. 90 tablet 3  ? UNITHROID 150 MCG tablet Take 1 tablet (150 mcg total) by mouth da

## 2021-10-06 ENCOUNTER — Other Ambulatory Visit (HOSPITAL_COMMUNITY): Payer: Self-pay

## 2021-10-06 MED ORDER — UNITHROID 150 MCG PO TABS
150.0000 ug | ORAL_TABLET | Freq: Every day | ORAL | 3 refills | Status: DC
Start: 2021-10-06 — End: 2022-11-23
  Filled 2021-10-06 – 2021-11-21 (×3): qty 90, 90d supply, fill #0
  Filled 2022-02-09: qty 90, 90d supply, fill #1
  Filled 2022-05-14: qty 90, 90d supply, fill #2
  Filled 2022-08-13: qty 30, 30d supply, fill #3
  Filled 2022-08-13: qty 90, 90d supply, fill #3
  Filled 2022-08-17: qty 30, 30d supply, fill #3
  Filled 2022-09-21: qty 30, 30d supply, fill #4
  Filled 2022-09-21: qty 3, 3d supply, fill #4

## 2021-10-19 ENCOUNTER — Encounter (HOSPITAL_COMMUNITY): Payer: Self-pay

## 2021-10-19 ENCOUNTER — Ambulatory Visit (HOSPITAL_COMMUNITY): Admit: 2021-10-19 | Payer: 59 | Admitting: Gastroenterology

## 2021-10-19 SURGERY — COLONOSCOPY WITH PROPOFOL
Anesthesia: Monitor Anesthesia Care

## 2021-10-20 ENCOUNTER — Other Ambulatory Visit (HOSPITAL_COMMUNITY): Payer: Self-pay

## 2021-10-20 MED FILL — Meloxicam Tab 15 MG: ORAL | 30 days supply | Qty: 30 | Fill #2 | Status: AC

## 2021-10-29 ENCOUNTER — Encounter: Payer: Self-pay | Admitting: Internal Medicine

## 2021-10-29 DIAGNOSIS — G8929 Other chronic pain: Secondary | ICD-10-CM

## 2021-10-30 DIAGNOSIS — G4731 Primary central sleep apnea: Secondary | ICD-10-CM | POA: Diagnosis not present

## 2021-10-30 DIAGNOSIS — G4733 Obstructive sleep apnea (adult) (pediatric): Secondary | ICD-10-CM | POA: Diagnosis not present

## 2021-11-01 ENCOUNTER — Other Ambulatory Visit (HOSPITAL_COMMUNITY): Payer: Self-pay

## 2021-11-01 DIAGNOSIS — M170 Bilateral primary osteoarthritis of knee: Secondary | ICD-10-CM | POA: Diagnosis not present

## 2021-11-01 MED ORDER — TRULICITY 3 MG/0.5ML ~~LOC~~ SOAJ
3.0000 mg | SUBCUTANEOUS | 3 refills | Status: DC
  Filled 2021-11-01: qty 2, 28d supply, fill #0

## 2021-11-01 NOTE — Addendum Note (Signed)
Addended by: Binnie Rail on: 11/01/2021 07:31 AM ? ? Modules accepted: Orders ? ?

## 2021-11-06 ENCOUNTER — Telehealth: Payer: Self-pay | Admitting: Gastroenterology

## 2021-11-06 NOTE — Telephone Encounter (Signed)
We received a call from patient regarding procedure. Per patient, "this is the 3rd time I told you guys not to schedule me, I will call to reschedule."  ? ?Pre-visit 12/08/21 has been cancelled. Please cancel [facility] admission 12/19/21.  ?

## 2021-11-06 NOTE — Telephone Encounter (Signed)
No documentation found indicating pt had ever requested NOT to be called nor rescheduled but rather found the following message: ? ?Inbound call from patient stating that she needs to reschedule her PV call and procedure on 4/27 at [facility] due to a family emergency. ? ?Procedure has been cancelled as pt has requested. Routing this message to Dr. Tarri Glenn as this has been the 3rd cancellation. Will await Dr. Tarri Glenn response re: how she wishes to proceed going further with future appts should pt call to request to reschedule. ?

## 2021-11-09 ENCOUNTER — Other Ambulatory Visit (HOSPITAL_COMMUNITY): Payer: Self-pay

## 2021-11-09 MED ORDER — TRULICITY 1.5 MG/0.5ML ~~LOC~~ SOAJ
1.5000 mg | SUBCUTANEOUS | 5 refills | Status: DC
Start: 2021-11-09 — End: 2021-11-10
  Filled 2021-11-09: qty 2, 28d supply, fill #0

## 2021-11-09 NOTE — Addendum Note (Signed)
Addended by: Binnie Rail on: 11/09/2021 09:37 AM   Modules accepted: Orders

## 2021-11-10 ENCOUNTER — Other Ambulatory Visit (HOSPITAL_COMMUNITY): Payer: Self-pay

## 2021-11-10 ENCOUNTER — Ambulatory Visit: Payer: 59 | Admitting: Internal Medicine

## 2021-11-10 ENCOUNTER — Encounter: Payer: Self-pay | Admitting: Internal Medicine

## 2021-11-10 DIAGNOSIS — A084 Viral intestinal infection, unspecified: Secondary | ICD-10-CM | POA: Insufficient documentation

## 2021-11-10 MED ORDER — DIPHENOXYLATE-ATROPINE 2.5-0.025 MG PO TABS
2.0000 | ORAL_TABLET | Freq: Four times a day (QID) | ORAL | 0 refills | Status: DC | PRN
  Filled 2021-11-10: qty 20, 3d supply, fill #0

## 2021-11-10 MED ORDER — TRULICITY 1.5 MG/0.5ML ~~LOC~~ SOAJ
1.5000 mg | SUBCUTANEOUS | 5 refills | Status: DC
  Filled 2021-11-10: qty 2, 28d supply, fill #0

## 2021-11-10 MED ORDER — ONDANSETRON HCL 8 MG PO TABS
8.0000 mg | ORAL_TABLET | Freq: Three times a day (TID) | ORAL | 0 refills | Status: DC | PRN
  Filled 2021-11-10: qty 20, 7d supply, fill #0

## 2021-11-10 NOTE — Progress Notes (Signed)
Subjective:    Patient ID: Erin [name], female    DOB: 1965/12/14, 56 y.o.   MRN: [id number]      HPI Erin [name] is here for  Chief Complaint  Patient presents with   Diarrhea   She thinks she may have a stomach bug.  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  She took her last truliicty Monday.  Initially she thought that was the cause, but she has not had the side effects before with it.   Wednesday her stomach started bubbling/gurgling- that was not usual.  Yesterday she had full blown diarrhea - muliple times a day.  She has been nauseous and had vomiting.  She still feels nauseous today, but has not vomited.  She is still having no diarrhea.  She did take Zofran 4 mg that she had at home yesterday, but it did not help.   She is drinking pepermint tea.  Eating pretzels.     Medications and allergies reviewed with patient and updated if appropriate.  Current Outpatient Medications on File Prior to Visit  Medication Sig Dispense Refill   ALPRAZolam (XANAX) 0.5 MG tablet Take 1 tablet (0.5 mg total) by mouth 2 (two) times daily as needed for anxiety. 60 tablet 0   blood glucose meter kit and supplies KIT Dispense based on patient and insurance preference. Use  daily as directd. (FOR E11.65). 1 each 0   cholecalciferol (VITAMIN D) 1000 units tablet Take 1,000 Units by mouth daily.     clotrimazole (LOTRIMIN) 1 % cream Apply 1 application topically 2 (two) times daily. Use for 2 weeks at a time as needed. 30 g 2   diltiazem (TIADYLT ER) 120 MG 24 hr capsule Take 1 capsule (120 mg total) by mouth daily. 90 capsule 3   Dulaglutide (TRULICITY) 1.5 DG/6.4QI SOPN Inject 1.5 mg into the skin once a week. 2 mL 5   famotidine (PEPCID) 20 MG tablet Take 1 tablet (20 mg total) by mouth 2 (two) times daily. 180 tablet 3   levonorgestrel (MIRENA) 20 MCG/24HR IUD 1 each by Intrauterine route once.      losartan (COZAAR) 50 MG tablet Take 1 tablet (50 mg total) by mouth  daily. 90 tablet 1   meloxicam (MOBIC) 15 MG tablet Take 1 tablet (15 mg total) by mouth daily. 30 tablet 3   methocarbamol (ROBAXIN) 750 MG tablet Take 1 tablet (750 mg total) by mouth 4 (four) times daily. 120 tablet 2   Multiple Vitamin (MULTIVITAMIN) tablet Take 1 tablet by mouth daily.     ondansetron (ZOFRAN) 4 MG tablet Take 1 tablet (4 mg total) by mouth every 8 (eight) hours as needed for nausea or vomiting. 30 tablet 3   rosuvastatin (CRESTOR) 10 MG tablet Take 1 tablet (10 mg total) by mouth daily. 90 tablet 3   UNITHROID 150 MCG tablet Take 1 tablet (150 mcg total) by mouth daily. 90 tablet 3   No current facility-administered medications on file prior to visit.    Review of Systems  Constitutional:  Positive for fatigue and fever. Negative for chills.  HENT:  Negative for congestion, ear pain, sinus pain and sore throat.   Respiratory:  Negative for cough.   Gastrointestinal:  Positive for diarrhea, nausea and vomiting. Negative for abdominal pain and blood in stool (no melena).  Neurological:  Positive for dizziness. Negative for headaches.      Objective:   Vitals:   11/10/21 1533  BP: 140/84  Pulse: 90  Temp: 98.6 F (37 C)  SpO2: 98%   BP Readings from Last 3 Encounters:  11/10/21 140/84  10/05/21 130/88  09/25/21 134/84   Wt Readings from Last 3 Encounters:  11/10/21 (!) 352 lb (159.7 kg)  10/05/21 (!) 358 lb 12.8 oz (162.8 kg)  09/25/21 (!) 355 lb (161 kg)   Body mass index is 58.58 kg/m.    Physical Exam Constitutional:      General: She is not in acute distress.    Appearance: Normal appearance.  HENT:     Head: Normocephalic and atraumatic.  Abdominal:     Palpations: Abdomen is soft.     Tenderness: There is no abdominal tenderness. There is no guarding or rebound.  Skin:    General: Skin is warm and dry.  Neurological:     Mental Status: She is alert.           Assessment & Plan:    See Problem List for Assessment and Plan of  chronic medical problems.

## 2021-11-10 NOTE — Assessment & Plan Note (Signed)
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

## 2021-11-10 NOTE — Patient Instructions (Addendum)
Medications changes include :   zofran 8 mg for nausea and lomotil for diarrhea   Your prescription(s) have been sent to your pharmacy.     Return if symptoms worsen or fail to improve.    Viral Gastroenteritis, Adult  Viral gastroenteritis is also known as the stomach flu. This condition may affect your stomach, your small intestine, and your large intestine. It can cause sudden watery poop (diarrhea), fever, and vomiting. This condition is caused by certain germs (viruses). These germs can be passed from person to person very easily (are contagious). Having watery poop and vomiting can make you feel weak and cause you to not have enough water in your body (get dehydrated). This can make you tired and thirsty, make you have a dry mouth, and make it so you pee (urinate) less often. It is important to replace the fluids that you lose from having watery poop and vomiting. What are the causes? You can get sick by catching germs from other people. You can also get sick by: Eating food, drinking water, or touching a surface that has the germs on it (is contaminated). Sharing utensils or other personal items with a person who is sick. What increases the risk? Having a weak body defense system (immune system). Living with one or more children who are younger than 2 years. Living in a nursing home. Going on cruise ships. What are the signs or symptoms? Symptoms of this condition start suddenly. Symptoms may last for a few days or for as long as a week. Common symptoms include: Watery poop. Vomiting. Other symptoms include: Fever. Headache. Feeling tired (fatigue). Pain in the belly (abdomen). Chills. Feeling weak. Feeling like you may vomit (nauseous). Muscle aches. Not feeling hungry. How is this treated? This condition typically goes away on its own. The focus of treatment is to replace the fluids that you lose. This condition may be treated with: An ORS (oral rehydration  solution). This is a drink that helps you replace fluids and minerals your body lost. It is sold at pharmacies and stores. Medicines to help with your symptoms. Probiotic supplements to reduce symptoms of watery poop. Fluids given through an IV tube, if needed. Older adults and people with other diseases or a weak body defense system are at higher risk for not having enough water in the body. Follow these instructions at home: Eating and drinking  Take an ORS as told by your doctor. Drink clear fluids in small amounts as you are able. Clear fluids include: Water. Ice chips. Fruit juice that has water added to it (is diluted). Low-calorie sports drinks. Drink enough fluid to keep your pee (urine) pale yellow. Eat small amounts of healthy foods every 3-4 hours as you are able. This may include whole grains, fruits, vegetables, lean meats, and yogurt. Avoid fluids that have a lot of sugar or caffeine in them. This includes energy drinks, sports drinks, and soda. Avoid spicy or fatty foods. Avoid alcohol. General instructions  Wash your hands often. This is very important after you have watery poop or you vomit. If you cannot use soap and water, use hand sanitizer. Make sure that all people in your home wash their hands well and often. Take over-the-counter and prescription medicines only as told by your doctor. Rest at home while you get better. Watch your condition for any changes. Take a warm bath to help with any burning or pain from having watery poop. Keep all follow-up visits. Contact a  doctor if: You cannot keep fluids down. Your symptoms get worse. You have new symptoms. You feel light-headed or dizzy. You have muscle cramps. Get help right away if: You have chest pain. You have trouble breathing, or you are breathing very fast. You have a fast heartbeat. You feel very weak or you faint. You have a very bad headache, a stiff neck, or both. You have a rash. You have very  bad pain, cramping, or bloating in your belly. Your skin feels cold and clammy. You feel mixed up (confused). You have pain when you pee. You have signs of not having enough water in the body, such as: Dark pee, hardly any pee, or no pee. Cracked lips. Dry mouth. Sunken eyes. Feeling very sleepy. Feeling weak. You have signs of bleeding, such as: You see blood in your vomit. Your vomit looks like coffee grounds. You have bloody or black poop or poop that looks like tar. These symptoms may be an emergency. Get help right away. Call 911. Do not wait to see if the symptoms will go away. Do not drive yourself to the hospital. Summary Viral gastroenteritis is also known as the stomach flu. This condition can cause sudden watery poop (diarrhea), fever, and vomiting. These germs can be passed from person to person very easily. Take an ORS (oral rehydration solution) as told by your doctor. This is a drink that is sold at pharmacies and stores. Wash your hands often, especially after having watery poop or vomiting. If you cannot use soap and water, use hand sanitizer. This information is not intended to replace advice given to you by your health care provider. Make sure you discuss any questions you have with your health care provider. Document Revised: 04/10/2021 Document Reviewed: 04/10/2021 Elsevier Patient Education  [place].

## 2021-11-15 ENCOUNTER — Other Ambulatory Visit: Payer: Self-pay | Admitting: Internal Medicine

## 2021-11-15 ENCOUNTER — Other Ambulatory Visit (HOSPITAL_COMMUNITY): Payer: Self-pay

## 2021-11-15 MED ORDER — LOSARTAN POTASSIUM 50 MG PO TABS
50.0000 mg | ORAL_TABLET | Freq: Every day | ORAL | 1 refills | Status: DC
  Filled 2021-11-15: qty 90, 90d supply, fill #0
  Filled 2022-02-09: qty 90, 90d supply, fill #1

## 2021-11-15 MED FILL — Meloxicam Tab 15 MG: ORAL | 30 days supply | Qty: 30 | Fill #3 | Status: AC

## 2021-11-16 ENCOUNTER — Other Ambulatory Visit (HOSPITAL_COMMUNITY): Payer: Self-pay

## 2021-11-16 ENCOUNTER — Encounter: Payer: Self-pay | Admitting: Internal Medicine

## 2021-11-21 ENCOUNTER — Other Ambulatory Visit (HOSPITAL_COMMUNITY): Payer: Self-pay

## 2021-12-11 ENCOUNTER — Encounter: Payer: Self-pay | Admitting: Internal Medicine

## 2021-12-12 ENCOUNTER — Other Ambulatory Visit: Payer: Self-pay | Admitting: Internal Medicine

## 2021-12-12 ENCOUNTER — Other Ambulatory Visit (HOSPITAL_COMMUNITY): Payer: Self-pay

## 2021-12-12 MED ORDER — MELOXICAM 15 MG PO TABS
15.0000 mg | ORAL_TABLET | Freq: Every day | ORAL | 3 refills | Status: DC
  Filled 2021-12-12: qty 30, 30d supply, fill #0
  Filled 2022-01-09: qty 30, 30d supply, fill #1
  Filled 2022-02-05: qty 30, 30d supply, fill #2
  Filled 2022-03-06: qty 30, 30d supply, fill #3

## 2021-12-12 MED FILL — Diltiazem HCl Extended Release Beads Cap ER 24HR 120 MG: ORAL | 60 days supply | Qty: 60 | Fill #2 | Status: AC

## 2021-12-19 ENCOUNTER — Encounter (HOSPITAL_COMMUNITY): Payer: Self-pay

## 2021-12-19 ENCOUNTER — Ambulatory Visit (HOSPITAL_COMMUNITY): Admit: 2021-12-19 | Payer: 59 | Admitting: Gastroenterology

## 2021-12-19 SURGERY — COLONOSCOPY WITH PROPOFOL
Anesthesia: Monitor Anesthesia Care

## 2021-12-21 DIAGNOSIS — M170 Bilateral primary osteoarthritis of knee: Secondary | ICD-10-CM | POA: Diagnosis not present

## 2021-12-22 ENCOUNTER — Other Ambulatory Visit (HOSPITAL_COMMUNITY): Payer: Self-pay

## 2021-12-27 ENCOUNTER — Other Ambulatory Visit (HOSPITAL_COMMUNITY): Payer: Self-pay

## 2021-12-27 ENCOUNTER — Other Ambulatory Visit: Payer: Self-pay | Admitting: Internal Medicine

## 2021-12-27 ENCOUNTER — Encounter: Payer: Self-pay | Admitting: Internal Medicine

## 2021-12-27 DIAGNOSIS — E118 Type 2 diabetes mellitus with unspecified complications: Secondary | ICD-10-CM | POA: Insufficient documentation

## 2021-12-27 MED ORDER — TRULICITY 3 MG/0.5ML ~~LOC~~ SOAJ
3.0000 mg | SUBCUTANEOUS | 0 refills | Status: DC
  Filled 2021-12-27: qty 6, 84d supply, fill #0

## 2021-12-27 NOTE — Progress Notes (Unsigned)
Lab Results  Component Value Date   WBC 10.5 08/02/2021   HGB 11.9 (L) 08/02/2021   HCT 37.2 08/02/2021   PLT 337 08/02/2021   GLUCOSE 192 (H) 09/25/2021   CHOL 235 (H) 09/25/2021   TRIG 179.0 (H) 09/25/2021   HDL 40.00 09/25/2021   LDLCALC 159 (H) 09/25/2021   ALT 21 09/25/2021   AST 18 09/25/2021   NA 139 09/25/2021   K 3.7 09/25/2021   CL 103 09/25/2021   CREATININE 0.75 09/25/2021   BUN 15 09/25/2021   CO2 28 09/25/2021   TSH 3.27 10/05/2021   HGBA1C 9.8 (H) 09/25/2021   MICROALBUR 0.8 09/25/2021

## 2021-12-28 ENCOUNTER — Encounter: Payer: Self-pay | Admitting: Internal Medicine

## 2021-12-29 ENCOUNTER — Other Ambulatory Visit: Payer: Self-pay

## 2022-01-09 ENCOUNTER — Other Ambulatory Visit (HOSPITAL_COMMUNITY): Payer: Self-pay

## 2022-01-12 ENCOUNTER — Ambulatory Visit: Payer: 59 | Admitting: Internal Medicine

## 2022-01-16 ENCOUNTER — Encounter: Payer: Self-pay | Admitting: Internal Medicine

## 2022-01-16 NOTE — Progress Notes (Unsigned)
Subjective:    Patient ID: Erin [name], female    DOB: 1966-04-22, 56 y.o.   MRN: [id number]     HPI Erin [name] is here for follow up of her chronic medical problems, including   Knees still hurting - seeing Erin [name].    Doing well with trulicity.  Sugar in 180's the past couple of days.  She has seen some lower sugars.  She is trying to eat better.  She would be open to seeing a nutritionist.   Exercise - at work - no formal exercise.  She is limited by her knee pain  Medications and allergies reviewed with patient and updated if appropriate.  Current Outpatient Medications on File Prior to Visit  Medication Sig Dispense Refill   ALPRAZolam (XANAX) 0.5 MG tablet Take 1 tablet (0.5 mg total) by mouth 2 (two) times daily as needed for anxiety. 60 tablet 0   blood glucose meter kit and supplies KIT Dispense based on patient and insurance preference. Use  daily as directd. (FOR E11.65). 1 each 0   cholecalciferol (VITAMIN D) 1000 units tablet Take 1,000 Units by mouth daily.     clotrimazole (LOTRIMIN) 1 % cream Apply 1 application topically 2 (two) times daily. Use for 2 weeks at a time as needed. 30 g 2   diltiazem (TIADYLT ER) 120 MG 24 hr capsule Take 1 capsule (120 mg total) by mouth daily. 90 capsule 3   diphenoxylate-atropine (LOMOTIL) 2.5-0.025 MG tablet Take 2 tablets by mouth 4 (four) times daily as needed for diarrhea or loose stools. 20 tablet 0   levonorgestrel (MIRENA) 20 MCG/24HR IUD 1 each by Intrauterine route once.      losartan (COZAAR) 50 MG tablet Take 1 tablet (50 mg total) by mouth daily. 90 tablet 1   meloxicam (MOBIC) 15 MG tablet Take 1 tablet (15 mg total) by mouth daily. 30 tablet 3   methocarbamol (ROBAXIN) 750 MG tablet Take 1 tablet (750 mg total) by mouth 4 (four) times daily. 120 tablet 2   Multiple Vitamin (MULTIVITAMIN) tablet Take 1 tablet by mouth daily.     ondansetron (ZOFRAN) 8 MG tablet Take 1 tablet (8 mg total) by mouth every 8  (eight) hours as needed for nausea or vomiting. 20 tablet 0   rosuvastatin (CRESTOR) 10 MG tablet Take 1 tablet (10 mg total) by mouth daily. 90 tablet 3   UNITHROID 150 MCG tablet Take 1 tablet (150 mcg total) by mouth daily. 90 tablet 3   No current facility-administered medications on file prior to visit.     Review of Systems  Constitutional:  Negative for fever.  Respiratory:  Negative for cough, shortness of breath and wheezing.   Cardiovascular:  Negative for chest pain, palpitations and leg swelling.  Musculoskeletal:  Positive for arthralgias. Negative for back pain.  Neurological:  Positive for headaches (occ). Negative for light-headedness.       Objective:   Vitals:   01/17/22 1123 01/17/22 1130  BP: (!) 136/94 136/88  Pulse: 86   Temp: 98.7 F (37.1 C)   SpO2: 98%    BP Readings from Last 3 Encounters:  01/17/22 136/88  11/10/21 140/84  10/05/21 130/88   Wt Readings from Last 3 Encounters:  01/17/22 (!) 351 lb 6 oz (159.4 kg)  11/10/21 (!) 352 lb (159.7 kg)  10/05/21 (!) 358 lb 12.8 oz (162.8 kg)   Body mass index is 58.47 kg/m.    Physical Exam Constitutional:  General: She is not in acute distress.    Appearance: Normal appearance.  HENT:     Head: Normocephalic and atraumatic.  Eyes:     Conjunctiva/sclera: Conjunctivae normal.  Cardiovascular:     Rate and Rhythm: Normal rate and regular rhythm.     Heart sounds: Normal heart sounds. No murmur heard. Pulmonary:     Effort: Pulmonary effort is normal. No respiratory distress.     Breath sounds: Normal breath sounds. No wheezing.  Musculoskeletal:     Cervical back: Neck supple.     Right lower leg: No edema.     Left lower leg: No edema.  Lymphadenopathy:     Cervical: No cervical adenopathy.  Skin:    General: Skin is warm and dry.     Findings: No rash.  Neurological:     Mental Status: She is alert. Mental status is at baseline.  Psychiatric:        Mood and Affect: Mood  normal.        Behavior: Behavior normal.        Lab Results  Component Value Date   WBC 10.5 08/02/2021   HGB 11.9 (L) 08/02/2021   HCT 37.2 08/02/2021   PLT 337 08/02/2021   GLUCOSE 192 (H) 09/25/2021   CHOL 235 (H) 09/25/2021   TRIG 179.0 (H) 09/25/2021   HDL 40.00 09/25/2021   LDLCALC 159 (H) 09/25/2021   ALT 21 09/25/2021   AST 18 09/25/2021   NA 139 09/25/2021   K 3.7 09/25/2021   CL 103 09/25/2021   CREATININE 0.75 09/25/2021   BUN 15 09/25/2021   CO2 28 09/25/2021   TSH 3.27 10/05/2021   HGBA1C 9.8 (H) 09/25/2021   MICROALBUR 0.8 09/25/2021     Assessment & Plan:    See Problem List for Assessment and Plan of chronic medical problems.

## 2022-01-16 NOTE — Patient Instructions (Addendum)
     Blood work was ordered.     Medications changes include :   none   A referral was ordered for nutrition - someone will call you to schedule an appointment.    Return in about 4 months (around 05/20/2022) for follow up.    [facility] [address] [place],  [place]  [id number] [facility] [address] [place], [place] [address] [phone]  [facility]  [address] [place], [place], [place] [address] Phone: [phone]  [facility] [place], [place], [place] [address] Phone: [phone]

## 2022-01-17 ENCOUNTER — Ambulatory Visit: Payer: 59 | Admitting: Internal Medicine

## 2022-01-17 ENCOUNTER — Other Ambulatory Visit (HOSPITAL_COMMUNITY): Payer: Self-pay

## 2022-01-17 VITALS — BP 136/88 | HR 86 | Temp 98.7°F | Ht 65.0 in | Wt 351.4 lb

## 2022-01-17 DIAGNOSIS — K2100 Gastro-esophageal reflux disease with esophagitis, without bleeding: Secondary | ICD-10-CM | POA: Diagnosis not present

## 2022-01-17 DIAGNOSIS — E782 Mixed hyperlipidemia: Secondary | ICD-10-CM | POA: Diagnosis not present

## 2022-01-17 DIAGNOSIS — E1165 Type 2 diabetes mellitus with hyperglycemia: Secondary | ICD-10-CM | POA: Diagnosis not present

## 2022-01-17 DIAGNOSIS — D508 Other iron deficiency anemias: Secondary | ICD-10-CM

## 2022-01-17 DIAGNOSIS — F419 Anxiety disorder, unspecified: Secondary | ICD-10-CM

## 2022-01-17 DIAGNOSIS — M5416 Radiculopathy, lumbar region: Secondary | ICD-10-CM

## 2022-01-17 DIAGNOSIS — E890 Postprocedural hypothyroidism: Secondary | ICD-10-CM

## 2022-01-17 DIAGNOSIS — I10 Essential (primary) hypertension: Secondary | ICD-10-CM | POA: Diagnosis not present

## 2022-01-17 LAB — LIPID PANEL
Cholesterol: 134 mg/dL (ref 0–200)
HDL: 40.6 mg/dL (ref >39.00)
LDL Cholesterol: 77 mg/dL (ref 0–99)
NonHDL: 93.01
Total CHOL/HDL Ratio: 3
Triglycerides: 81 mg/dL (ref 0.0–149.0)
VLDL: 16.2 mg/dL (ref 0.0–40.0)

## 2022-01-17 LAB — COMPREHENSIVE METABOLIC PANEL
ALT: 25 U/L (ref 0–35)
AST: 21 U/L (ref 0–37)
Albumin: 4.1 g/dL (ref 3.5–5.2)
Alkaline Phosphatase: 233 U/L — ABNORMAL HIGH (ref 39–117)
BUN: 16 mg/dL (ref 6–23)
CO2: 30 mEq/L (ref 19–32)
Calcium: 9.6 mg/dL (ref 8.4–10.5)
Chloride: 105 mEq/L (ref 96–112)
Creatinine, Ser: 0.69 mg/dL (ref 0.40–1.20)
GFR: 97.65 mL/min (ref >60.00)
Glucose, Bld: 169 mg/dL — ABNORMAL HIGH (ref 70–99)
Potassium: 4.3 mEq/L (ref 3.5–5.1)
Sodium: 140 mEq/L (ref 135–145)
Total Bilirubin: 0.5 mg/dL (ref 0.2–1.2)
Total Protein: 7.5 g/dL (ref 6.0–8.3)

## 2022-01-17 LAB — HEMOGLOBIN A1C: Hgb A1c MFr Bld: 9.1 % — ABNORMAL HIGH (ref 4.6–6.5)

## 2022-01-17 LAB — CBC WITH DIFFERENTIAL/PLATELET
Basophils Absolute: 0 10*3/uL (ref 0.0–0.1)
Basophils Relative: 0.4 % (ref 0.0–3.0)
Eosinophils Absolute: 0.1 10*3/uL (ref 0.0–0.7)
Eosinophils Relative: 1 % (ref 0.0–5.0)
HCT: 35.8 % — ABNORMAL LOW (ref 36.0–46.0)
Hemoglobin: 11.5 g/dL — ABNORMAL LOW (ref 12.0–15.0)
Lymphocytes Relative: 45.8 % (ref 12.0–46.0)
Lymphs Abs: 5.1 10*3/uL — ABNORMAL HIGH (ref 0.7–4.0)
MCHC: 32.1 g/dL (ref 30.0–36.0)
MCV: 84.3 fl (ref 78.0–100.0)
Monocytes Absolute: 0.5 10*3/uL (ref 0.1–1.0)
Monocytes Relative: 4.8 % (ref 3.0–12.0)
Neutro Abs: 5.3 10*3/uL (ref 1.4–7.7)
Neutrophils Relative %: 48 % (ref 43.0–77.0)
Platelets: 340 10*3/uL (ref 150.0–400.0)
RBC: 4.25 Mil/uL (ref 3.87–5.11)
RDW: 14.4 % (ref 11.5–15.5)
WBC: 11.1 10*3/uL — ABNORMAL HIGH (ref 4.0–10.5)

## 2022-01-17 LAB — IBC PANEL
Iron: 45 ug/dL (ref 42–145)
Saturation Ratios: 11.1 % — ABNORMAL LOW (ref 20.0–50.0)
TIBC: 406 ug/dL (ref 250.0–450.0)
Transferrin: 290 mg/dL (ref 212.0–360.0)

## 2022-01-17 LAB — FERRITIN: Ferritin: 62.3 ng/mL (ref 10.0–291.0)

## 2022-01-17 MED ORDER — TRULICITY 4.5 MG/0.5ML ~~LOC~~ SOAJ
4.5000 mg | SUBCUTANEOUS | 0 refills | Status: DC
  Filled 2022-01-17: qty 2, 28d supply, fill #0

## 2022-01-17 MED ORDER — FAMOTIDINE 20 MG PO TABS
20.0000 mg | ORAL_TABLET | Freq: Two times a day (BID) | ORAL | 3 refills | Status: DC
  Filled 2022-01-17: qty 180, 90d supply, fill #0
  Filled 2022-05-14: qty 180, 90d supply, fill #1

## 2022-01-17 NOTE — Assessment & Plan Note (Signed)
Chronic Blood pressure well controlled CMP Continue diltiazem ER 120 mg daily, losartan 50 mg daily

## 2022-01-17 NOTE — Assessment & Plan Note (Signed)
Chronic Regular exercise and healthy diet encouraged Check lipid panel  Continue Crestor 10 mg daily 

## 2022-01-17 NOTE — Assessment & Plan Note (Signed)
Chronic She is having some intermittent GERD symptoms-not currently taking the medication because she needs to contact GI I will refill the medication so that she does not have to go to another appointment Continue famotidine 20 mg twice daily-refill sent to pharmacy

## 2022-01-17 NOTE — Assessment & Plan Note (Addendum)
Chronic  Lab Results  Component Value Date   HGBA1C 9.8 (H) 09/25/2021   Sugars not ideally controlled with last blood work, but she is tolerating the Trulicity well and expect sugars to be better Testing sugars 1  times a day Check L4J Continue Trulicity increased to 4.5 mg weekly Stressed regular exercise, diabetic diet Stressed the importance of weight loss Stressed decreasing sugar and carb intake-Will refer to nutritionist

## 2022-01-17 NOTE — Assessment & Plan Note (Signed)
Chronic Following with Dr. Cruzita Lederer Current management of thyroid 150 mg daily

## 2022-01-17 NOTE — Assessment & Plan Note (Signed)
Chronic Discussed the importance of weight loss She is limited on how much exercise she can do because of knee arthritis-encouraged her to do as much she can-she is active at work Stressed decreased portions, diet high in protein and vegetables Decrease sugars and carbs Referral to nutrition

## 2022-01-17 NOTE — Assessment & Plan Note (Addendum)
Chronic Related to heavy menses in the past-currently has no bleeding and will have the IUD removed at some point Check CBC, iron panel

## 2022-01-17 NOTE — Assessment & Plan Note (Signed)
Chronic Intermittent symptoms Controlled Continue meloxicam 15 mg daily as needed and methocarbamol 750 mg 4 times daily as needed

## 2022-01-17 NOTE — Assessment & Plan Note (Signed)
Chronic Controlled, Stable Continue alprazolam 0.5 mg twice daily as needed, which she takes infrequently

## 2022-01-29 DIAGNOSIS — G4733 Obstructive sleep apnea (adult) (pediatric): Secondary | ICD-10-CM | POA: Diagnosis not present

## 2022-01-29 DIAGNOSIS — G4731 Primary central sleep apnea: Secondary | ICD-10-CM | POA: Diagnosis not present

## 2022-02-02 ENCOUNTER — Encounter: Payer: Self-pay | Admitting: Internal Medicine

## 2022-02-05 ENCOUNTER — Other Ambulatory Visit (HOSPITAL_COMMUNITY): Payer: Self-pay

## 2022-02-09 ENCOUNTER — Other Ambulatory Visit: Payer: Self-pay | Admitting: Internal Medicine

## 2022-02-09 ENCOUNTER — Other Ambulatory Visit (HOSPITAL_COMMUNITY): Payer: Self-pay

## 2022-02-11 MED ORDER — DILTIAZEM HCL ER BEADS 120 MG PO CP24
120.0000 mg | ORAL_CAPSULE | Freq: Every day | ORAL | 3 refills | Status: DC
  Filled 2022-02-11: qty 90, 90d supply, fill #0
  Filled 2022-05-14: qty 90, 90d supply, fill #1
  Filled 2022-08-13: qty 90, 90d supply, fill #2
  Filled 2022-11-13: qty 90, 90d supply, fill #3

## 2022-02-12 ENCOUNTER — Other Ambulatory Visit (HOSPITAL_COMMUNITY): Payer: Self-pay

## 2022-02-20 ENCOUNTER — Telehealth: Payer: Self-pay | Admitting: Nutrition

## 2022-02-20 ENCOUNTER — Ambulatory Visit: Payer: 59 | Admitting: Nutrition

## 2022-02-20 NOTE — Telephone Encounter (Signed)
Patient called to reschedule appointment for today.  Dates and times given for appointments that I have for next week.

## 2022-02-24 DIAGNOSIS — J029 Acute pharyngitis, unspecified: Secondary | ICD-10-CM | POA: Diagnosis not present

## 2022-02-24 DIAGNOSIS — I10 Essential (primary) hypertension: Secondary | ICD-10-CM | POA: Diagnosis not present

## 2022-02-24 DIAGNOSIS — Z6841 Body Mass Index (BMI) 40.0 and over, adult: Secondary | ICD-10-CM | POA: Diagnosis not present

## 2022-02-24 DIAGNOSIS — J309 Allergic rhinitis, unspecified: Secondary | ICD-10-CM | POA: Diagnosis not present

## 2022-02-24 DIAGNOSIS — Z03818 Encounter for observation for suspected exposure to other biological agents ruled out: Secondary | ICD-10-CM | POA: Diagnosis not present

## 2022-03-06 ENCOUNTER — Other Ambulatory Visit: Payer: Self-pay | Admitting: Internal Medicine

## 2022-03-06 ENCOUNTER — Other Ambulatory Visit (HOSPITAL_COMMUNITY): Payer: Self-pay

## 2022-03-06 MED ORDER — TRULICITY 4.5 MG/0.5ML ~~LOC~~ SOAJ
4.5000 mg | SUBCUTANEOUS | 0 refills | Status: DC
  Filled 2022-03-06 (×2): qty 2, 28d supply, fill #0

## 2022-03-07 ENCOUNTER — Other Ambulatory Visit (HOSPITAL_COMMUNITY): Payer: Self-pay

## 2022-03-09 ENCOUNTER — Other Ambulatory Visit (HOSPITAL_COMMUNITY): Payer: Self-pay

## 2022-03-29 ENCOUNTER — Other Ambulatory Visit (HOSPITAL_COMMUNITY): Payer: Self-pay

## 2022-04-04 ENCOUNTER — Other Ambulatory Visit: Payer: Self-pay | Admitting: Internal Medicine

## 2022-04-04 ENCOUNTER — Other Ambulatory Visit (HOSPITAL_COMMUNITY): Payer: Self-pay

## 2022-04-04 MED ORDER — TRULICITY 4.5 MG/0.5ML ~~LOC~~ SOAJ
4.5000 mg | SUBCUTANEOUS | 0 refills | Status: DC
  Filled 2022-04-04: qty 2, 28d supply, fill #0

## 2022-04-05 ENCOUNTER — Other Ambulatory Visit (HOSPITAL_COMMUNITY): Payer: Self-pay

## 2022-04-05 ENCOUNTER — Other Ambulatory Visit: Payer: Self-pay | Admitting: Internal Medicine

## 2022-04-05 MED ORDER — MELOXICAM 15 MG PO TABS
15.0000 mg | ORAL_TABLET | Freq: Every day | ORAL | 3 refills | Status: DC
  Filled 2022-04-05: qty 30, 30d supply, fill #0
  Filled 2022-04-30: qty 30, 30d supply, fill #1
  Filled 2022-05-29: qty 30, 30d supply, fill #2
  Filled 2022-07-02: qty 30, 30d supply, fill #3

## 2022-04-12 ENCOUNTER — Ambulatory Visit: Payer: 59 | Admitting: Internal Medicine

## 2022-04-12 NOTE — Progress Notes (Deleted)
Patient ID: Erin [name], female   DOB: 05/25/1966, 56 y.o.   MRN: [id number]   HPI  Erin [name] is a 56 y.o.-year-old female, initially referred by her PCP, Dr. Quay Burow, presenting for follow-up for postablative hypothyroidism.  She previously saw Dr. Dwyane Dee.  She is not usually compliant with the recommended appointment intervals.  Last visit with me 6 months ago.  She has previous noncompliance with visits.  Interim history: No increased urination, blurry vision, nausea, chest pain. She continues to have occasional dysphagia with drier foods. She continues on Trulicity.  Her previous nausea improved.  Reviewed history: Pt. has been dx with hypothyroidism after RAI treatment for Graves' disease in 08/2008 >> on Unithroid d.a.w.. She had a lot of variability in the TFTs on generic LT4.  In the past she was not taking the medication correctly and we discussed about how to do so.  At last visit, TSH was elevated so I advised her to increase her LT4 dose.  However, she did not return for repeat labs afterwards and, upon questioning, at last visit, she was still on Unithroid 100 mcg daily...  We increased the dose to 112 mcg daily in 05/2020.   We increased the dose to 137 mcg daily in 03/2021.  We increased the dose to 150 mcg daily in 04/2021.  She is taking this: - in am - fasting - at least 30 min from b'fast - no calcium - no iron - no multivitamins - no PPIs, on H2 blocker  - not lately - stopped Biotin  Reviewed her TFTs: Lab Results  Component Value Date   TSH 3.27 10/05/2021   TSH 3.39 07/12/2021   TSH 5.67 (H) 05/24/2021   TSH 11.52 (H) 04/05/2021   TSH 5.98 (H) 06/22/2020   TSH 6.72 (H) 06/08/2020   TSH 8.72 (H) 10/24/2018   TSH 1.34 05/16/2018   TSH 0.43 03/13/2018   TSH 1.06 01/29/2018   FREET4 0.92 10/05/2021   FREET4 0.96 07/12/2021   FREET4 0.82 05/24/2021   FREET4 0.79 04/05/2021   FREET4 0.87 06/22/2020   FREET4 0.83 05/16/2018   FREET4 1.14  11/27/2017   FREET4 1.11 08/27/2017   FREET4 0.67 06/27/2017   FREET4 1.17 10/29/2016   T3FREE 6.7 (H) 09/04/2007   Antithyroid antibodies: No results found for: "THGAB" No components found for: "TPOAB"  Pt denies: - feeling nodules in neck - hoarseness - choking - SOB with lying down She has  dysphagia with dry foods.   She has + FH of thyroid disorders in: mother, M aunts. No FH of thyroid cancer. No h/o radiation tx to head or neck. No Biotin use. No recent steroids use.   She also has uncontrolled diabetes - She previously wanted me to also start managing this, but she was lost to follow-up for me afterwards.  This is is currently managed by PCP. - h/o DKA in [id number] - on Trulicity 4.5 mg weekly now - slight nausea. - Amaryl caused stomach discomfort.    Reviewed HbA1c levels: Lab Results  Component Value Date   HGBA1C 9.1 (H) 01/17/2022   HGBA1C 9.8 (H) 09/25/2021   HGBA1C 9.6 (H) 02/01/2021   HGBA1C 8.6 (H) 06/08/2020   HGBA1C 7.9 (H) 09/16/2019   HGBA1C 7.2 (H) 02/03/2019   HGBA1C 6.9 (H) 10/24/2018   HGBA1C 6.2 (A) 08/29/2018   HGBA1C 10.3 (H) 03/13/2018   HGBA1C 12.7 (H) 01/15/2018   No CKD: Lab Results  Component Value Date   BUN 16  01/17/2022   Lab Results  Component Value Date   CREATININE 0.69 01/17/2022  On Cozaar 50.  + HL: Lab Results  Component Value Date   CHOL 134 01/17/2022   HDL 40.60 01/17/2022   LDLCALC 77 01/17/2022   TRIG 81.0 01/17/2022   CHOLHDL 3 01/17/2022  She was not on a statin - but started Crestor 10 mg daily after the above results returned.  Last eye exam: 01/2021: No DR-Dr. Sarajane Marek  She was on high doses on Prednisone (60 mg) in the past, then decreased.  Now off prednisone.  ROS: Signs see HPI  I reviewed pt's medications, allergies, PMH, social hx, family hx, and changes were documented in the history of present illness. Otherwise, unchanged from my initial visit note.  Past Medical History:  Diagnosis  Date   Abnormal uterine bleeding    Anemia    COVID 12/2018   Depression    Diabetes mellitus without complication (HCC)    gestational   Elevated blood-pressure reading without diagnosis of hypertension    Endometriosis    Fibroids    GERD (gastroesophageal reflux disease)    Graves disease    Grief at loss of child    H/O gestational diabetes mellitus, not currently pregnant 09/28/2015   Hypertension    Hypothyroidism    Interstitial cystitis    Menorrhagia    Oropharyngeal candidiasis 01/16/2018   OSA (obstructive sleep apnea), severe 06/23/2018   Premature ventricular contractions    a. rare PVC by monitor 12/2016.   Right ovarian cyst    3 cm.   Thyroid disease    Uterine leiomyoma    Past Surgical History:  Procedure Laterality Date   APPENDECTOMY     ARTERY BIOPSY Left 10/25/2017   Procedure: BIOPSY TEMPORAL ARTERY;  Surgeon: Aviva Signs, MD;  Location: [facility];  Service: General;  Laterality: Left;   CESAREAN SECTION     CHOLECYSTECTOMY     HERNIA REPAIR     incisional   TUMOR REMOVAL  fibroids   Social History   Socioeconomic History   Marital status: Married    Spouse name: Not on file   Number of children: Not on file   Years of education: Not on file   Highest education level: Not on file  Occupational History   Not on file  Tobacco Use   Smoking status: Never   Smokeless tobacco: Never  Vaping Use   Vaping Use: Never used  Substance and Sexual Activity   Alcohol use: No   Drug use: No   Sexual activity: Never    Birth control/protection: I.U.D.    Comment: Mirena inserted 2015/2016  Other Topics Concern   Not on file  Social History Narrative   Not on file   Social Determinants of Health   Financial Resource Strain: Not on file  Food Insecurity: Not on file  Transportation Needs: Not on file  Physical Activity: Not on file  Stress: Not on file  Social Connections: Not on file  Intimate Partner Violence: Not on file   Current Outpatient  Medications on File Prior to Visit  Medication Sig Dispense Refill   ALPRAZolam (XANAX) 0.5 MG tablet Take 1 tablet (0.5 mg total) by mouth 2 (two) times daily as needed for anxiety. 60 tablet 0   blood glucose meter kit and supplies KIT Dispense based on patient and insurance preference. Use  daily as directd. (FOR E11.65). 1 each 0   cholecalciferol (VITAMIN D) 1000 units tablet  Take 1,000 Units by mouth daily.     clotrimazole (LOTRIMIN) 1 % cream Apply 1 application topically 2 (two) times daily. Use for 2 weeks at a time as needed. 30 g 2   diltiazem (TIAZAC) 120 MG 24 hr capsule Take 1 capsule (120 mg total) by mouth daily. 90 capsule 3   diphenoxylate-atropine (LOMOTIL) 2.5-0.025 MG tablet Take 2 tablets by mouth 4 (four) times daily as needed for diarrhea or loose stools. 20 tablet 0   Dulaglutide (TRULICITY) 4.5 LN/9.8XQ SOPN Inject 4.5 mg as directed once a week. 2 mL 0   famotidine (PEPCID) 20 MG tablet Take 1 tablet (20 mg total) by mouth 2 (two) times daily. 180 tablet 3   levonorgestrel (MIRENA) 20 MCG/24HR IUD 1 each by Intrauterine route once.      losartan (COZAAR) 50 MG tablet Take 1 tablet (50 mg total) by mouth daily. 90 tablet 1   meloxicam (MOBIC) 15 MG tablet Take 1 tablet (15 mg total) by mouth daily. 30 tablet 3   methocarbamol (ROBAXIN) 750 MG tablet Take 1 tablet (750 mg total) by mouth 4 (four) times daily. 120 tablet 2   Multiple Vitamin (MULTIVITAMIN) tablet Take 1 tablet by mouth daily.     ondansetron (ZOFRAN) 8 MG tablet Take 1 tablet (8 mg total) by mouth every 8 (eight) hours as needed for nausea or vomiting. 20 tablet 0   rosuvastatin (CRESTOR) 10 MG tablet Take 1 tablet (10 mg total) by mouth daily. 90 tablet 3   UNITHROID 150 MCG tablet Take 1 tablet (150 mcg total) by mouth daily. 90 tablet 3   No current facility-administered medications on file prior to visit.   Allergies  Allergen Reactions   Prednisone Other (See Comments)    Raises blood sugar so  high that she receives intensive care   Fluoxetine    Glimepiride     Stomach upset   Prozac [Fluoxetine Hcl] Nausea Only   Hydrocodone Hives, Itching and Rash    On thighs    Metformin And Related     diarrhea   Family History  Problem Relation Age of Onset   Hypertension Mother    Cancer Mother    Breast cancer Mother 67   Hypertension Father    Diabetes Father    Cancer Father    Ovarian cancer Maternal Aunt    Thyroid disease Maternal Aunt        hypothyroid   PE: There were no vitals taken for this visit. Wt Readings from Last 3 Encounters:  01/17/22 (!) 351 lb 6 oz (159.4 kg)  11/10/21 (!) 352 lb (159.7 kg)  10/05/21 (!) 358 lb 12.8 oz (162.8 kg)   Constitutional: overweight, in NAD Eyes:  EOMI, no exophthalmos ENT: no neck masses, no cervical lymphadenopathy Cardiovascular: RRR, No MRG Respiratory: CTA B Musculoskeletal: no deformities Skin:no rashes Neurological: no tremor with outstretched hands  ASSESSMENT: 1. Hypothyroidism - After RAI treatment for Graves' disease  2. 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 Patient with longstanding, uncontrolled, hypothyroidism, on levothyroxine therapy.  She is not usually compliant with appointments.  However, she now returns 6 months after her previous visit. - latest thyroid labs reviewed with pt. >> normal: Lab Results  Component Value Date   TSH 3.27 10/05/2021  - she continues on LT4 150 mcg daily - pt feels good on this dose. - we discussed about taking the thyroid hormone every day, with water, >30 minutes before breakfast, separated by >4  hours from acid reflux medications, calcium, iron, multivitamins. Pt. is taking it correctly. - will check thyroid tests today: TSH and fT4 - If labs are abnormal, she will need to return for repeat TFTs in 1.5 months  2 DM 2/2 steroid use -In the past, she wanted me to manage this, but she was not compliant with appointments as she now continues to follow with PCP. -last HbA1c  decreased from 9.8% to: Lab Results  Component Value Date   HGBA1C 9.1 (H) 01/17/2022  -Previously on Rybelsus, but she could not tolerate it due to increased sweating of blurry vision.  This is also expensive. -She tried Amaryl but this caused abdominal pain -At last visit, she just started Trulicity, which she tolerated reasonably, with only slight nausea >> currently at the maximal dose of 1.5 mg weekly -Continue to follow-up with PCP  Philemon Kingdom, MD PhD [facility]

## 2022-04-20 ENCOUNTER — Encounter (HOSPITAL_BASED_OUTPATIENT_CLINIC_OR_DEPARTMENT_OTHER): Payer: Self-pay | Admitting: *Deleted

## 2022-04-20 ENCOUNTER — Other Ambulatory Visit: Payer: Self-pay

## 2022-04-20 ENCOUNTER — Emergency Department (HOSPITAL_BASED_OUTPATIENT_CLINIC_OR_DEPARTMENT_OTHER)
Admission: EM | Admit: 2022-04-20 | Discharge: 2022-04-20 | Disposition: A | Discharge status: Home / Self Care | Payer: 59 | Attending: Emergency Medicine | Admitting: Emergency Medicine

## 2022-04-20 DIAGNOSIS — K648 Other hemorrhoids: Secondary | ICD-10-CM | POA: Insufficient documentation

## 2022-04-20 DIAGNOSIS — K5900 Constipation, unspecified: Secondary | ICD-10-CM | POA: Insufficient documentation

## 2022-04-20 DIAGNOSIS — I10 Essential (primary) hypertension: Secondary | ICD-10-CM | POA: Diagnosis not present

## 2022-04-20 DIAGNOSIS — K649 Unspecified hemorrhoids: Secondary | ICD-10-CM | POA: Diagnosis not present

## 2022-04-20 MED ORDER — DOCUSATE SODIUM 100 MG PO CAPS: 100.0000 mg | ORAL_CAPSULE | Freq: Two times a day (BID) | ORAL | 0 refills | Status: DC

## 2022-04-20 MED ORDER — FLEET ENEMA 7-19 GM/118ML RE ENEM: 1.0000 | ENEMA | Freq: Once | RECTAL | 0 refills | Status: AC

## 2022-04-20 MED ORDER — SENNOSIDES-DOCUSATE SODIUM 8.6-50 MG PO TABS: 1.0000 | ORAL_TABLET | Freq: Two times a day (BID) | ORAL | 0 refills | Status: AC

## 2022-04-20 NOTE — Discharge Instructions (Addendum)
You were seen today for with constipation and hemorrhoids.  Please take MiraLAX as a laxative and docusate as a stool softener.  These combined will hopefully help you have a bowel movement.  You may also try a Fleet enema at home.  If these fail to work and you continue to have problems you may return to the emergency department or urgent care for possible disimpaction.  Try over-the-counter medication such as Preparation H for hemorrhoid care.  Please try to avoid straining as this will aggravate hemorrhoids.

## 2022-04-20 NOTE — ED Provider Notes (Signed)
[facility] EMERGENCY DEPARTMENT Provider Note   CSN: [id number] Arrival date & time: 04/20/22  1442     History  Chief Complaint  Patient presents with   Constipation    Erin [name] is a 56 y.o. female.  Patient presents to the emergency department complaining of constipation and painful defecation.  Patient states that earlier this afternoon she went to have a bowel movement and felt like she had "razor blades" in her rectum.  She was unable to have a large bowel movement at that time. She does endorse a small amount of bright red blood on her toilet paper.  She denies abdominal pain, nausea, vomiting  HPI     Home Medications Prior to Admission medications   Medication Sig Start Date End Date Taking? Authorizing Provider  docusate sodium (COLACE) 100 MG capsule Take 1 capsule (100 mg total) by mouth every 12 (twelve) hours. 04/20/22  Yes Dorothyann Peng, PA-C  senna-docusate (SENOKOT-S) 8.6-50 MG tablet Take 1 tablet by mouth 2 (two) times daily for 10 days. 04/20/22 04/30/22 Yes Dorothyann Peng, PA-C  sodium phosphate (FLEET) 7-19 GM/118ML ENEM Place 133 mLs (1 enema total) rectally once for 1 dose. 04/20/22 04/20/22 Yes Dorothyann Peng, PA-C  ALPRAZolam Duanne Moron) 0.5 MG tablet Take 1 tablet (0.5 mg total) by mouth 2 (two) times daily as needed for anxiety. 02/01/21   Binnie Rail, MD  blood glucose meter kit and supplies KIT Dispense based on patient and insurance preference. Use  daily as directd. (FOR E11.65). 03/29/21   Binnie Rail, MD  cholecalciferol (VITAMIN D) 1000 units tablet Take 1,000 Units by mouth daily.    [provider]  clotrimazole (LOTRIMIN) 1 % cream Apply 1 application topically 2 (two) times daily. Use for 2 weeks at a time as needed. 10/05/20   Nunzio Cobbs, MD  diltiazem (TIAZAC) 120 MG 24 hr capsule Take 1 capsule (120 mg total) by mouth daily. 02/11/22   Binnie Rail, MD  diphenoxylate-atropine (LOMOTIL)  2.5-0.025 MG tablet Take 2 tablets by mouth 4 (four) times daily as needed for diarrhea or loose stools. 11/10/21   Burns, Claudina Lick, MD  Dulaglutide (TRULICITY) 4.5 CW/2.3JS SOPN Inject 4.5 mg as directed once a week. 04/04/22   Binnie Rail, MD  famotidine (PEPCID) 20 MG tablet Take 1 tablet (20 mg total) by mouth 2 (two) times daily. 01/17/22   Binnie Rail, MD  levonorgestrel (MIRENA) 20 MCG/24HR IUD 1 each by Intrauterine route once.  06/26/15   [provider]  losartan (COZAAR) 50 MG tablet Take 1 tablet (50 mg total) by mouth daily. 11/15/21   Binnie Rail, MD  meloxicam (MOBIC) 15 MG tablet Take 1 tablet (15 mg total) by mouth daily. 04/05/22   Binnie Rail, MD  methocarbamol (ROBAXIN) 750 MG tablet Take 1 tablet (750 mg total) by mouth 4 (four) times daily. 06/30/21   Binnie Rail, MD  Multiple Vitamin (MULTIVITAMIN) tablet Take 1 tablet by mouth daily.    [provider]  ondansetron (ZOFRAN) 8 MG tablet Take 1 tablet (8 mg total) by mouth every 8 (eight) hours as needed for nausea or vomiting. 11/10/21   Burns, Claudina Lick, MD  rosuvastatin (CRESTOR) 10 MG tablet Take 1 tablet (10 mg total) by mouth daily. 09/25/21   Binnie Rail, MD  UNITHROID 150 MCG tablet Take 1 tablet (150 mcg total) by mouth daily. 10/06/21   Philemon Kingdom,  MD      Allergies    Prednisone, Fluoxetine, Glimepiride, Prozac [fluoxetine hcl], Hydrocodone, and Metformin and related    Review of Systems   Review of Systems  Gastrointestinal:  Positive for anal bleeding and constipation. Negative for abdominal pain, nausea and vomiting.    Physical Exam Updated Vital Signs BP (!) 146/86   Pulse 100   Temp 98.2 F (36.8 C)   Resp 18   SpO2 96%  Physical Exam Vitals and nursing note reviewed. Exam conducted with a chaperone present.  HENT:     Head: Normocephalic and atraumatic.  Eyes:     Pupils: Pupils are equal, round, and reactive to light.  Cardiovascular:     Rate and Rhythm: Normal  rate.  Pulmonary:     Effort: Pulmonary effort is normal. No respiratory distress.  Abdominal:     Palpations: Abdomen is soft.     Tenderness: There is no abdominal tenderness.  Genitourinary:    Rectum: Tenderness and internal hemorrhoid present.  Musculoskeletal:        General: No signs of injury.     Cervical back: Normal range of motion.  Skin:    General: Skin is dry.  Neurological:     Mental Status: She is alert.  Psychiatric:        Speech: Speech normal.        Behavior: Behavior normal.     ED Results / Procedures / Treatments   Labs (all labs ordered are listed, but only abnormal results are displayed) Labs Reviewed - No data to display  EKG None  Radiology No results found.  Procedures Procedures    Medications Ordered in ED Medications - No data to display  ED Course/ Medical Decision Making/ A&P                           Medical Decision Making  Patient presents to the emergency department with a chief complaint of constipation.  Rectal exam does reveal internal hemorrhoids.  No large impaction appreciated on rectal exam  The patient reports constipation for the past 2 to 3 days.  She has tried no remedies including stool softeners, laxatives, enemas.  I discussed possible treatments ranging from home attempts using laxatives, stool softeners, enemas, enema here in the emergency department, and manual disimpaction.  Patient has chosen at this time to try stool softeners and laxatives at home.  This is perfectly reasonable.  She will give these time to work.  If these fail to work she understands that she may return to the emergency department as needed for possible disimpaction.  The patient does have internal hemorrhoids.  Plan to discharge patient home with prescription for cortisone suppositories  No indication at this time for admission, imaging, or lab work.  Discharge patient home        Final Clinical Impression(s) / ED  Diagnoses Final diagnoses:  Constipation, unspecified constipation type  Internal hemorrhoid    Rx / DC Orders ED Discharge Orders          Ordered    docusate sodium (COLACE) 100 MG capsule  Every 12 hours        04/20/22 2131    senna-docusate (SENOKOT-S) 8.6-50 MG tablet  2 times daily        04/20/22 2131    sodium phosphate (FLEET) 7-19 GM/118ML ENEM   Once        04/20/22 2131  Dorothyann Peng, PA-C 04/20/22 2131    Leanord Asal K, DO 04/20/22 2316

## 2022-04-20 NOTE — ED Notes (Signed)
Pain with going to the bathroom, said it feels like razor blades being passed. Small amounts of feces at a time.

## 2022-04-20 NOTE — ED Triage Notes (Signed)
Patient states she tried to have a BM and it felt like she had razor blades in her rectum.  She states it feels like it is stuck in her rectum.  She could not get the stool to pass.  Patient does not have a hx of constipation.  This is a first occurrence.  She did just start taking joint supplements.  She noticed some bleeding on the tissue today.

## 2022-04-29 DIAGNOSIS — G4731 Primary central sleep apnea: Secondary | ICD-10-CM | POA: Diagnosis not present

## 2022-04-29 DIAGNOSIS — G4733 Obstructive sleep apnea (adult) (pediatric): Secondary | ICD-10-CM | POA: Diagnosis not present

## 2022-04-30 ENCOUNTER — Other Ambulatory Visit: Payer: Self-pay | Admitting: Internal Medicine

## 2022-04-30 ENCOUNTER — Other Ambulatory Visit (HOSPITAL_COMMUNITY): Payer: Self-pay

## 2022-04-30 MED ORDER — TRULICITY 4.5 MG/0.5ML ~~LOC~~ SOAJ
4.5000 mg | SUBCUTANEOUS | 1 refills | Status: DC
  Filled 2022-04-30: qty 6, 84d supply, fill #0
  Filled 2022-07-24: qty 6, 84d supply, fill #1

## 2022-05-01 ENCOUNTER — Other Ambulatory Visit (HOSPITAL_COMMUNITY): Payer: Self-pay

## 2022-05-14 ENCOUNTER — Other Ambulatory Visit: Payer: Self-pay | Admitting: Internal Medicine

## 2022-05-14 ENCOUNTER — Other Ambulatory Visit (HOSPITAL_COMMUNITY): Payer: Self-pay

## 2022-05-14 MED ORDER — LOSARTAN POTASSIUM 50 MG PO TABS
50.0000 mg | ORAL_TABLET | Freq: Every day | ORAL | 1 refills | Status: DC
  Filled 2022-05-14: qty 90, 90d supply, fill #0

## 2022-05-14 MED ORDER — FREESTYLE LANCETS MISC
12 refills | Status: DC
  Filled 2022-05-14 – 2022-05-25 (×2): qty 100, 90d supply, fill #0
  Filled 2022-10-18: qty 100, 90d supply, fill #1

## 2022-05-14 MED ORDER — FREESTYLE LITE W/DEVICE KIT
PACK | 0 refills | Status: DC
  Filled 2022-05-14: qty 1, 90d supply, fill #0

## 2022-05-14 MED ORDER — FREESTYLE LITE TEST VI STRP
ORAL_STRIP | 12 refills | Status: DC
  Filled 2022-05-14: qty 50, 50d supply, fill #0
  Filled 2022-05-25: qty 100, 90d supply, fill #0

## 2022-05-15 ENCOUNTER — Other Ambulatory Visit (HOSPITAL_COMMUNITY): Payer: Self-pay

## 2022-05-24 ENCOUNTER — Encounter: Payer: Self-pay | Admitting: Internal Medicine

## 2022-05-24 NOTE — Patient Instructions (Addendum)
     Flu immunization administered today.      Blood work was ordered.   The lab is on the first floor.    Medications changes include :   increase losartan to 100 mg daily     Return in about 4 months (around 09/24/2022) for follow up.

## 2022-05-24 NOTE — Progress Notes (Signed)
Subjective:    Patient ID: Erin [name], female    DOB: 1966-06-17, 56 y.o.   MRN: [id number]     HPI Erin [name] is here for follow up of her chronic medical problems, including DM, htn, hld, hypothyroid, lumbar radiculopathy, gerd, anxiety  Not exercising due to knee pain.   She is somewhat compliant with a diabetic diet.  Sugar today 139. Has had 63, 70's.  She is tolerating the Trulicity well.  She needs to lose weight in order to get her left knee replaced.  Medications and allergies reviewed with patient and updated if appropriate.  Current Outpatient Medications on File Prior to Visit  Medication Sig Dispense Refill   ALPRAZolam (XANAX) 0.5 MG tablet Take 1 tablet (0.5 mg total) by mouth 2 (two) times daily as needed for anxiety. 60 tablet 0   Blood Glucose Monitoring Suppl (FREESTYLE LITE) w/Device KIT Use as directed. 1 kit 0   cholecalciferol (VITAMIN D) 1000 units tablet Take 1,000 Units by mouth daily.     clotrimazole (LOTRIMIN) 1 % cream Apply 1 application topically 2 (two) times daily. Use for 2 weeks at a time as needed. 30 g 2   diltiazem (TIAZAC) 120 MG 24 hr capsule Take 1 capsule (120 mg total) by mouth daily. 90 capsule 3   diphenoxylate-atropine (LOMOTIL) 2.5-0.025 MG tablet Take 2 tablets by mouth 4 (four) times daily as needed for diarrhea or loose stools. 20 tablet 0   docusate sodium (COLACE) 100 MG capsule Take 1 capsule (100 mg total) by mouth every 12 (twelve) hours. 60 capsule 0   Dulaglutide (TRULICITY) 4.5 DU/2.0UR SOPN Inject 4.5 mg as directed once a week. 6 mL 1   famotidine (PEPCID) 20 MG tablet Take 1 tablet (20 mg total) by mouth 2 (two) times daily. 180 tablet 3   glucose blood (FREESTYLE LITE) test strip Use as instructed 100 each 12   Lancets (FREESTYLE) lancets Use as instructed 100 each 12   levonorgestrel (MIRENA) 20 MCG/24HR IUD 1 each by Intrauterine route once.      losartan (COZAAR) 50 MG tablet Take 1 tablet (50 mg total) by  mouth daily. 90 tablet 1   meloxicam (MOBIC) 15 MG tablet Take 1 tablet (15 mg total) by mouth daily. 30 tablet 3   methocarbamol (ROBAXIN) 750 MG tablet Take 1 tablet (750 mg total) by mouth 4 (four) times daily. 120 tablet 2   Multiple Vitamin (MULTIVITAMIN) tablet Take 1 tablet by mouth daily.     ondansetron (ZOFRAN) 8 MG tablet Take 1 tablet (8 mg total) by mouth every 8 (eight) hours as needed for nausea or vomiting. 20 tablet 0   rosuvastatin (CRESTOR) 10 MG tablet Take 1 tablet (10 mg total) by mouth daily. 90 tablet 3   UNITHROID 150 MCG tablet Take 1 tablet (150 mcg total) by mouth daily. 90 tablet 3   No current facility-administered medications on file prior to visit.     Review of Systems  Constitutional:  Negative for fever.  HENT:  Positive for nosebleeds.   Respiratory:  Negative for cough, shortness of breath and wheezing.   Cardiovascular:  Positive for chest pain (occ - sharp burning pain - very transient) and palpitations. Negative for leg swelling.  Musculoskeletal:  Positive for arthralgias.  Neurological:  Positive for light-headedness (occ) and headaches.       Objective:   Vitals:   05/25/22 1053  BP: (!) 142/86  Pulse: 90  Temp: 98.2  F (36.8 C)  SpO2: 97%   BP Readings from Last 3 Encounters:  05/25/22 (!) 142/86  04/20/22 138/84  01/17/22 136/88   Wt Readings from Last 3 Encounters:  05/25/22 (!) 349 lb (158.3 kg)  01/17/22 (!) 351 lb 6 oz (159.4 kg)  11/10/21 (!) 352 lb (159.7 kg)   Body mass index is 58.08 kg/m.    Physical Exam Constitutional:      General: She is not in acute distress.    Appearance: Normal appearance.  HENT:     Head: Normocephalic and atraumatic.  Eyes:     Conjunctiva/sclera: Conjunctivae normal.  Cardiovascular:     Rate and Rhythm: Normal rate and regular rhythm.     Heart sounds: Normal heart sounds. No murmur heard. Pulmonary:     Effort: Pulmonary effort is normal. No respiratory distress.     Breath  sounds: Normal breath sounds. No wheezing.  Musculoskeletal:     Cervical back: Neck supple.     Right lower leg: No edema.     Left lower leg: No edema.  Lymphadenopathy:     Cervical: No cervical adenopathy.  Skin:    General: Skin is warm and dry.     Findings: No rash.  Neurological:     Mental Status: She is alert. Mental status is at baseline.  Psychiatric:        Mood and Affect: Mood normal.        Behavior: Behavior normal.        Lab Results  Component Value Date   WBC 11.1 (H) 01/17/2022   HGB 11.5 (L) 01/17/2022   HCT 35.8 (L) 01/17/2022   PLT 340.0 01/17/2022   GLUCOSE 169 (H) 01/17/2022   CHOL 134 01/17/2022   TRIG 81.0 01/17/2022   HDL 40.60 01/17/2022   LDLCALC 77 01/17/2022   ALT 25 01/17/2022   AST 21 01/17/2022   NA 140 01/17/2022   K 4.3 01/17/2022   CL 105 01/17/2022   CREATININE 0.69 01/17/2022   BUN 16 01/17/2022   CO2 30 01/17/2022   TSH 3.27 10/05/2021   HGBA1C 9.1 (H) 01/17/2022   MICROALBUR 0.8 09/25/2021     Assessment & Plan:    See Problem List for Assessment and Plan of chronic medical problems.

## 2022-05-25 ENCOUNTER — Other Ambulatory Visit (HOSPITAL_COMMUNITY): Payer: Self-pay

## 2022-05-25 ENCOUNTER — Ambulatory Visit: Payer: 59 | Admitting: Internal Medicine

## 2022-05-25 VITALS — BP 132/72 | HR 90 | Temp 98.2°F | Ht 65.0 in | Wt 349.0 lb

## 2022-05-25 DIAGNOSIS — E890 Postprocedural hypothyroidism: Secondary | ICD-10-CM

## 2022-05-25 DIAGNOSIS — E782 Mixed hyperlipidemia: Secondary | ICD-10-CM | POA: Diagnosis not present

## 2022-05-25 DIAGNOSIS — Z23 Encounter for immunization: Secondary | ICD-10-CM

## 2022-05-25 DIAGNOSIS — E1165 Type 2 diabetes mellitus with hyperglycemia: Secondary | ICD-10-CM

## 2022-05-25 DIAGNOSIS — F419 Anxiety disorder, unspecified: Secondary | ICD-10-CM | POA: Diagnosis not present

## 2022-05-25 DIAGNOSIS — K219 Gastro-esophageal reflux disease without esophagitis: Secondary | ICD-10-CM

## 2022-05-25 DIAGNOSIS — M5416 Radiculopathy, lumbar region: Secondary | ICD-10-CM | POA: Diagnosis not present

## 2022-05-25 DIAGNOSIS — I10 Essential (primary) hypertension: Secondary | ICD-10-CM

## 2022-05-25 LAB — CBC WITH DIFFERENTIAL/PLATELET
Basophils Absolute: 0.1 10*3/uL (ref 0.0–0.1)
Basophils Relative: 1.1 % (ref 0.0–3.0)
Eosinophils Absolute: 0.7 10*3/uL (ref 0.0–0.7)
Eosinophils Relative: 4.9 % (ref 0.0–5.0)
HCT: 36.7 % (ref 36.0–46.0)
Hemoglobin: 11.6 g/dL — ABNORMAL LOW (ref 12.0–15.0)
Lymphocytes Relative: 36.1 % (ref 12.0–46.0)
Lymphs Abs: 4.8 10*3/uL — ABNORMAL HIGH (ref 0.7–4.0)
MCHC: 31.5 g/dL (ref 30.0–36.0)
MCV: 84 fl (ref 78.0–100.0)
Monocytes Absolute: 0.5 10*3/uL (ref 0.1–1.0)
Monocytes Relative: 3.8 % (ref 3.0–12.0)
Neutro Abs: 7.2 10*3/uL (ref 1.4–7.7)
Neutrophils Relative %: 54.1 % (ref 43.0–77.0)
Platelets: 337 10*3/uL (ref 150.0–400.0)
RBC: 4.37 Mil/uL (ref 3.87–5.11)
RDW: 14.4 % (ref 11.5–15.5)
WBC: 13.4 10*3/uL — ABNORMAL HIGH (ref 4.0–10.5)

## 2022-05-25 LAB — COMPREHENSIVE METABOLIC PANEL
ALT: 20 U/L (ref 0–35)
AST: 15 U/L (ref 0–37)
Albumin: 4.1 g/dL (ref 3.5–5.2)
Alkaline Phosphatase: 219 U/L — ABNORMAL HIGH (ref 39–117)
BUN: 14 mg/dL (ref 6–23)
CO2: 29 mEq/L (ref 19–32)
Calcium: 9.4 mg/dL (ref 8.4–10.5)
Chloride: 104 mEq/L (ref 96–112)
Creatinine, Ser: 0.69 mg/dL (ref 0.40–1.20)
GFR: 97.41 mL/min (ref >60.00)
Glucose, Bld: 124 mg/dL — ABNORMAL HIGH (ref 70–99)
Potassium: 4.2 mEq/L (ref 3.5–5.1)
Sodium: 140 mEq/L (ref 135–145)
Total Bilirubin: 0.5 mg/dL (ref 0.2–1.2)
Total Protein: 7.7 g/dL (ref 6.0–8.3)

## 2022-05-25 LAB — HEMOGLOBIN A1C: Hgb A1c MFr Bld: 7.6 % — ABNORMAL HIGH (ref 4.6–6.5)

## 2022-05-25 LAB — LIPID PANEL
Cholesterol: 121 mg/dL (ref 0–200)
HDL: 42.9 mg/dL (ref >39.00)
LDL Cholesterol: 66 mg/dL (ref 0–99)
NonHDL: 78.44
Total CHOL/HDL Ratio: 3
Triglycerides: 61 mg/dL (ref 0.0–149.0)
VLDL: 12.2 mg/dL (ref 0.0–40.0)

## 2022-05-25 LAB — IBC PANEL
Iron: 44 ug/dL (ref 42–145)
Saturation Ratios: 12.6 % — ABNORMAL LOW (ref 20.0–50.0)
TIBC: 348.6 ug/dL (ref 250.0–450.0)
Transferrin: 249 mg/dL (ref 212.0–360.0)

## 2022-05-25 LAB — FERRITIN: Ferritin: 53.5 ng/mL (ref 10.0–291.0)

## 2022-05-25 MED ORDER — DOCUSATE SODIUM 100 MG PO CAPS
100.0000 mg | ORAL_CAPSULE | Freq: Every day | ORAL | 5 refills | Status: AC | PRN
  Filled 2022-05-25 – 2022-05-29 (×2): qty 90, 30d supply, fill #0
  Filled 2022-06-26: qty 100, 33d supply, fill #0

## 2022-05-25 MED ORDER — LOSARTAN POTASSIUM 100 MG PO TABS
100.0000 mg | ORAL_TABLET | Freq: Every day | ORAL | 3 refills | Status: DC
  Filled 2022-05-25 – 2022-06-26 (×2): qty 90, 90d supply, fill #0
  Filled 2022-10-18: qty 90, 90d supply, fill #1
  Filled 2023-01-18 (×2): qty 90, 90d supply, fill #2
  Filled 2023-04-22: qty 90, 90d supply, fill #3

## 2022-05-25 NOTE — Assessment & Plan Note (Addendum)
Chronic Controlled, Stable Continue alprazolam 0.5 mg twice daily as needed-she does not take often.  She does have a lot of stress in her life now and will continue to take as needed

## 2022-05-25 NOTE — Assessment & Plan Note (Addendum)
Chronic Blood pressure not well controlled CMP Continue diltiazem 120 mg daily, increase losartan to 100 mg daily

## 2022-05-25 NOTE — Addendum Note (Signed)
Addended by: Marcina Millard on: 05/25/2022 04:34 PM   Modules accepted: Orders

## 2022-05-25 NOTE — Assessment & Plan Note (Signed)
Chronic Management per endocrine-Dr. Cruzita Lederer On unithyroid 150 mcg daily

## 2022-05-25 NOTE — Assessment & Plan Note (Signed)
Chronic Regular exercise and healthy diet encouraged Check lipid panel  Continue Crestor 10 mg daily 

## 2022-05-25 NOTE — Assessment & Plan Note (Addendum)
Chronic Intermittent symptoms Continue meloxicam 15 mg daily , methocarbamol 750 mg every 6 hours as needed - taking both daily Encouraged weight loss

## 2022-05-25 NOTE — Assessment & Plan Note (Signed)
Chronic GERD controlled Continue famotidine 20 mg twice daily

## 2022-05-25 NOTE — Assessment & Plan Note (Addendum)
Chronic   Lab Results  Component Value Date   HGBA1C 9.1 (H) 01/17/2022   Sugars not ideally controlled by A1c when it was last checked, but sugars at home are well-controlled, but does her fasting sugar is only and she is somewhat compliant with diabetic diet hard to know for sure Testing sugars 1 times a day Check H2D Continue Trulicity 4.5 mg weekly Stressed regular exercise, diabetic diet

## 2022-05-29 ENCOUNTER — Other Ambulatory Visit (HOSPITAL_COMMUNITY): Payer: Self-pay

## 2022-06-01 ENCOUNTER — Other Ambulatory Visit (HOSPITAL_COMMUNITY): Payer: Self-pay

## 2022-06-06 DIAGNOSIS — H524 Presbyopia: Secondary | ICD-10-CM | POA: Diagnosis not present

## 2022-06-06 DIAGNOSIS — E119 Type 2 diabetes mellitus without complications: Secondary | ICD-10-CM | POA: Diagnosis not present

## 2022-06-06 DIAGNOSIS — H04123 Dry eye syndrome of bilateral lacrimal glands: Secondary | ICD-10-CM | POA: Diagnosis not present

## 2022-06-06 DIAGNOSIS — H52203 Unspecified astigmatism, bilateral: Secondary | ICD-10-CM | POA: Diagnosis not present

## 2022-06-06 DIAGNOSIS — H5203 Hypermetropia, bilateral: Secondary | ICD-10-CM | POA: Diagnosis not present

## 2022-06-07 ENCOUNTER — Encounter: Payer: Self-pay | Admitting: Internal Medicine

## 2022-06-09 ENCOUNTER — Encounter: Payer: Self-pay | Admitting: Internal Medicine

## 2022-06-26 ENCOUNTER — Other Ambulatory Visit: Payer: Self-pay

## 2022-06-26 ENCOUNTER — Other Ambulatory Visit (HOSPITAL_COMMUNITY): Payer: Self-pay

## 2022-06-27 ENCOUNTER — Other Ambulatory Visit: Payer: Self-pay

## 2022-06-29 ENCOUNTER — Other Ambulatory Visit (HOSPITAL_COMMUNITY): Payer: Self-pay

## 2022-07-02 ENCOUNTER — Other Ambulatory Visit (HOSPITAL_COMMUNITY): Payer: Self-pay

## 2022-07-13 ENCOUNTER — Other Ambulatory Visit (HOSPITAL_COMMUNITY): Payer: Self-pay

## 2022-07-13 ENCOUNTER — Encounter (HOSPITAL_BASED_OUTPATIENT_CLINIC_OR_DEPARTMENT_OTHER): Payer: Self-pay

## 2022-07-13 ENCOUNTER — Emergency Department (HOSPITAL_BASED_OUTPATIENT_CLINIC_OR_DEPARTMENT_OTHER)
Admission: EM | Admit: 2022-07-13 | Discharge: 2022-07-13 | Disposition: A | Discharge status: Home / Self Care | Payer: 59 | Attending: Emergency Medicine | Admitting: Emergency Medicine

## 2022-07-13 ENCOUNTER — Other Ambulatory Visit: Payer: Self-pay

## 2022-07-13 ENCOUNTER — Emergency Department (HOSPITAL_BASED_OUTPATIENT_CLINIC_OR_DEPARTMENT_OTHER): Payer: 59

## 2022-07-13 ENCOUNTER — Encounter: Payer: Self-pay | Admitting: Internal Medicine

## 2022-07-13 DIAGNOSIS — N201 Calculus of ureter: Secondary | ICD-10-CM | POA: Diagnosis not present

## 2022-07-13 DIAGNOSIS — N200 Calculus of kidney: Secondary | ICD-10-CM | POA: Diagnosis not present

## 2022-07-13 DIAGNOSIS — R748 Abnormal levels of other serum enzymes: Secondary | ICD-10-CM | POA: Insufficient documentation

## 2022-07-13 DIAGNOSIS — D649 Anemia, unspecified: Secondary | ICD-10-CM | POA: Insufficient documentation

## 2022-07-13 DIAGNOSIS — D72829 Elevated white blood cell count, unspecified: Secondary | ICD-10-CM | POA: Diagnosis not present

## 2022-07-13 DIAGNOSIS — N132 Hydronephrosis with renal and ureteral calculous obstruction: Secondary | ICD-10-CM | POA: Insufficient documentation

## 2022-07-13 DIAGNOSIS — R1032 Left lower quadrant pain: Secondary | ICD-10-CM | POA: Diagnosis present

## 2022-07-13 DIAGNOSIS — N23 Unspecified renal colic: Secondary | ICD-10-CM | POA: Diagnosis not present

## 2022-07-13 DIAGNOSIS — Z79899 Other long term (current) drug therapy: Secondary | ICD-10-CM | POA: Insufficient documentation

## 2022-07-13 DIAGNOSIS — E1165 Type 2 diabetes mellitus with hyperglycemia: Secondary | ICD-10-CM | POA: Insufficient documentation

## 2022-07-13 DIAGNOSIS — Z8616 Personal history of COVID-19: Secondary | ICD-10-CM | POA: Insufficient documentation

## 2022-07-13 DIAGNOSIS — I10 Essential (primary) hypertension: Secondary | ICD-10-CM | POA: Diagnosis not present

## 2022-07-13 DIAGNOSIS — E039 Hypothyroidism, unspecified: Secondary | ICD-10-CM | POA: Insufficient documentation

## 2022-07-13 LAB — URINALYSIS, ROUTINE W REFLEX MICROSCOPIC
Bacteria, UA: NONE SEEN
Bilirubin Urine: NEGATIVE
Glucose, UA: NEGATIVE mg/dL
Ketones, ur: NEGATIVE mg/dL
Leukocytes,Ua: NEGATIVE
Nitrite: NEGATIVE
Protein, ur: 30 mg/dL — AB
RBC / HPF: >50 RBC/hpf — ABNORMAL HIGH (ref 0–5)
Specific Gravity, Urine: 1.026 (ref 1.005–1.030)
pH: 5.5 (ref 5.0–8.0)

## 2022-07-13 LAB — CBC
HCT: 35.8 % — ABNORMAL LOW (ref 36.0–46.0)
Hemoglobin: 11.5 g/dL — ABNORMAL LOW (ref 12.0–15.0)
MCH: 27.3 pg (ref 26.0–34.0)
MCHC: 32.1 g/dL (ref 30.0–36.0)
MCV: 84.8 fL (ref 80.0–100.0)
Platelets: 301 10*3/uL (ref 150–400)
RBC: 4.22 MIL/uL (ref 3.87–5.11)
RDW: 14.3 % (ref 11.5–15.5)
WBC: 11.8 10*3/uL — ABNORMAL HIGH (ref 4.0–10.5)
nRBC: 0 % (ref 0.0–0.2)

## 2022-07-13 LAB — COMPREHENSIVE METABOLIC PANEL
ALT: 32 U/L (ref 0–44)
AST: 27 U/L (ref 15–41)
Albumin: 4.1 g/dL (ref 3.5–5.0)
Alkaline Phosphatase: 158 U/L — ABNORMAL HIGH (ref 38–126)
Anion gap: 11 (ref 5–15)
BUN: 20 mg/dL (ref 6–20)
CO2: 24 mmol/L (ref 22–32)
Calcium: 9.4 mg/dL (ref 8.9–10.3)
Chloride: 106 mmol/L (ref 98–111)
Creatinine, Ser: 0.86 mg/dL (ref 0.44–1.00)
GFR, Estimated: >60 mL/min (ref >60)
Glucose, Bld: 158 mg/dL — ABNORMAL HIGH (ref 70–99)
Potassium: 3.6 mmol/L (ref 3.5–5.1)
Sodium: 141 mmol/L (ref 135–145)
Total Bilirubin: 0.7 mg/dL (ref 0.3–1.2)
Total Protein: 7.8 g/dL (ref 6.5–8.1)

## 2022-07-13 LAB — PREGNANCY, URINE: Preg Test, Ur: NEGATIVE

## 2022-07-13 LAB — LIPASE, BLOOD: Lipase: 53 U/L — ABNORMAL HIGH (ref 11–51)

## 2022-07-13 MED ORDER — KETOROLAC TROMETHAMINE 15 MG/ML IJ SOLN
15.0000 mg | Freq: Once | INTRAMUSCULAR | Status: AC
  Administered 2022-07-13: 15 mg via INTRAVENOUS
  Filled 2022-07-13: qty 1

## 2022-07-13 MED ORDER — TAMSULOSIN HCL 0.4 MG PO CAPS
0.4000 mg | ORAL_CAPSULE | Freq: Once | ORAL | Status: AC
  Administered 2022-07-13: 0.4 mg via ORAL
  Filled 2022-07-13: qty 1

## 2022-07-13 MED ORDER — METOCLOPRAMIDE HCL 5 MG/ML IJ SOLN
10.0000 mg | Freq: Once | INTRAMUSCULAR | Status: AC
  Administered 2022-07-13: 10 mg via INTRAVENOUS
  Filled 2022-07-13: qty 2

## 2022-07-13 MED ORDER — ONDANSETRON 4 MG PO TBDP
4.0000 mg | ORAL_TABLET | Freq: Three times a day (TID) | ORAL | 0 refills | Status: AC | PRN
  Filled 2022-07-13: qty 15, 5d supply, fill #0

## 2022-07-13 MED ORDER — TAMSULOSIN HCL 0.4 MG PO CAPS
0.4000 mg | ORAL_CAPSULE | Freq: Every day | ORAL | 0 refills | Status: AC
  Filled 2022-07-13: qty 3, 3d supply, fill #0

## 2022-07-13 MED ORDER — ONDANSETRON HCL 4 MG/2ML IJ SOLN
4.0000 mg | Freq: Once | INTRAMUSCULAR | Status: DC | PRN
  Filled 2022-07-13: qty 2

## 2022-07-13 NOTE — ED Triage Notes (Signed)
Pt states she has had an abdominal hernia for several years, states pain has worsened over past few weeks and has had nausea.

## 2022-07-13 NOTE — ED Notes (Signed)
Pt to CT scan.

## 2022-07-13 NOTE — ED Provider Notes (Signed)
[facility] DEPT Provider Note  CSN: [id number] Arrival date & time: 07/13/22 0103  Chief Complaint(s) Hernia  HPI Erin [name] is a 57 y.o. female with a past medical history listed below including hypertension, prediabetes here for left flank pain.  Pain has been intermittent for the past several days but worsened tonight.  Pain is severe in intensity.  Fluctuating in nature.  No alleviating or aggravating factors.  Patient believes this might be related to her prior hernia.  Denies any urinary symptoms.  Endorses nausea without emesis.  No fevers or chills.  No coughing or congestion.  No chest pain or shortness of breath.  No other physical complaints.  The history is provided by the patient.    Past Medical History Past Medical History:  Diagnosis Date   Abnormal uterine bleeding    Anemia    COVID 12/2018   Depression    Diabetes mellitus without complication (HCC)    gestational   Elevated blood-pressure reading without diagnosis of hypertension    Endometriosis    Fibroids    GERD (gastroesophageal reflux disease)    Graves disease    Grief at loss of child    H/O gestational diabetes mellitus, not currently pregnant 09/28/2015   Hypertension    Hypothyroidism    Interstitial cystitis    Menorrhagia    Oropharyngeal candidiasis 01/16/2018   OSA (obstructive sleep apnea), severe 06/23/2018   Premature ventricular contractions    a. rare PVC by monitor 12/2016.   Right ovarian cyst    3 cm.   Thyroid disease    Uterine leiomyoma    Patient Active Problem List   Diagnosis Date Noted   Type II diabetes mellitus with manifestations ([place]) 12/27/2021   Chronic left hip pain 10/04/2020   De Quervain's tenosynovitis, right 06/16/2019   Patellofemoral arthritis of left knee 11/28/2018   Left knee pain 11/24/2018   Episodic lightheadedness 10/24/2018   Lumbar back pain with radiculopathy affecting left lower extremity 10/07/2018   Right ovarian  cyst    OSA (obstructive sleep apnea), severe 06/23/2018   Anxiety 03/05/2018   Chronic nonintractable headache 03/05/2018   Hyperlipidemia 01/23/2018   Diabetes ([place]) 01/16/2018   DKA (diabetic ketoacidoses) 01/15/2018   Gastroesophageal reflux disease 10/05/2017   Temporal arteritis ([place]) 10/03/2017   Hypertension 09/28/2015   Morbid obesity ([place]) 34/19/3790   Umbilical hernia 24/02/7352   Palpitations 05/17/2015   Iron deficiency anemia 09/03/2007   Hypothyroidism following radioiodine therapy 09/02/2007   INTERSTITIAL CYSTITIS 09/01/2007   ALLERGIC RHINITIS 08/07/2007   [place] DISEASE, LUMBAR 08/07/2007   Home Medication(s) Prior to Admission medications   Medication Sig Start Date End Date Taking? Authorizing Provider  ondansetron (ZOFRAN-ODT) 4 MG disintegrating tablet Take 1 tablet (4 mg total) by mouth every 8 (eight) hours as needed for up to 3 days for nausea or vomiting. 07/13/22 07/16/22 Yes Azhar Knope, Grayce Sessions, MD  tamsulosin (FLOMAX) 0.4 MG CAPS capsule Take 1 capsule (0.4 mg total) by mouth daily for 3 days. 07/13/22 07/16/22 Yes Nyazia Canevari, Grayce Sessions, MD  ALPRAZolam Duanne Moron) 0.5 MG tablet Take 1 tablet (0.5 mg total) by mouth 2 (two) times daily as needed for anxiety. 02/01/21   Binnie Rail, MD  Blood Glucose Monitoring Suppl (FREESTYLE LITE) w/Device KIT Use as directed. 05/14/22   Binnie Rail, MD  cholecalciferol (VITAMIN D) 1000 units tablet Take 1,000 Units by mouth daily.    [provider]  clotrimazole (LOTRIMIN) 1 % cream Apply  1 application topically 2 (two) times daily. Use for 2 weeks at a time as needed. 10/05/20   Nunzio Cobbs, MD  diltiazem (TIAZAC) 120 MG 24 hr capsule Take 1 capsule (120 mg total) by mouth daily. 02/11/22   Binnie Rail, MD  diphenoxylate-atropine (LOMOTIL) 2.5-0.025 MG tablet Take 2 tablets by mouth 4 (four) times daily as needed for diarrhea or loose stools. 11/10/21   Binnie Rail, MD  docusate sodium (COLACE)  100 MG capsule Take 1-3 capsules (100-300 mg total) by mouth daily as needed for mild constipation. 05/25/22   Burns, Claudina Lick, MD  Dulaglutide (TRULICITY) 4.5 MW/1.0UV SOPN Inject 4.5 mg as directed once a week. 04/30/22   Binnie Rail, MD  famotidine (PEPCID) 20 MG tablet Take 1 tablet (20 mg total) by mouth 2 (two) times daily. 01/17/22   Binnie Rail, MD  glucose blood (FREESTYLE LITE) test strip Use as instructed 05/14/22   Binnie Rail, MD  Lancets (FREESTYLE) lancets Use as instructed 05/14/22   Binnie Rail, MD  levonorgestrel (MIRENA) 20 MCG/24HR IUD 1 each by Intrauterine route once.  06/26/15   [provider]  losartan (COZAAR) 100 MG tablet Take 1 tablet (100 mg total) by mouth daily. 05/25/22   Binnie Rail, MD  meloxicam (MOBIC) 15 MG tablet Take 1 tablet (15 mg total) by mouth daily. 04/05/22   Binnie Rail, MD  methocarbamol (ROBAXIN) 750 MG tablet Take 1 tablet (750 mg total) by mouth 4 (four) times daily. 06/30/21   Binnie Rail, MD  Multiple Vitamin (MULTIVITAMIN) tablet Take 1 tablet by mouth daily.    [provider]  rosuvastatin (CRESTOR) 10 MG tablet Take 1 tablet (10 mg total) by mouth daily. 09/25/21   Binnie Rail, MD  UNITHROID 150 MCG tablet Take 1 tablet (150 mcg total) by mouth daily. 10/06/21   Philemon Kingdom, MD                                                                                                                                    Allergies Prednisone, Fluoxetine, Glimepiride, Prozac [fluoxetine hcl], Hydrocodone, and Metformin and related  Review of Systems Review of Systems As noted in HPI  Physical Exam Vital Signs  I have reviewed the triage vital signs BP (!) 150/88   Pulse 96   Temp 98.7 F (37.1 C) (Oral)   Resp 19   Ht '5\' 5"'$  (1.651 m)   Wt (!) 154.2 kg   SpO2 93%   BMI 56.58 kg/m   Physical Exam Vitals reviewed.  Constitutional:      General: She is not in acute distress.    Appearance: She is  well-developed. She is morbidly obese. She is not diaphoretic.     Comments: Writhing in bed in obvious discomfort  HENT:     Head: Normocephalic and atraumatic.  Right Ear: External ear normal.     Left Ear: External ear normal.     Nose: Nose normal.  Eyes:     General: No scleral icterus.    Conjunctiva/sclera: Conjunctivae normal.  Neck:     Trachea: Phonation normal.  Cardiovascular:     Rate and Rhythm: Normal rate and regular rhythm.  Pulmonary:     Effort: Pulmonary effort is normal. No respiratory distress.     Breath sounds: No stridor.  Abdominal:     General: There is no distension.     Tenderness: There is no abdominal tenderness.     Hernia: No hernia is present.  Musculoskeletal:        General: Normal range of motion.     Cervical back: Normal range of motion.  Neurological:     Mental Status: She is alert and oriented to person, place, and time.  Psychiatric:        Behavior: Behavior normal.     ED Results and Treatments Labs (all labs ordered are listed, but only abnormal results are displayed) Labs Reviewed  LIPASE, BLOOD - Abnormal; Notable for the following components:      Result Value   Lipase 53 (*)    All other components within normal limits  COMPREHENSIVE METABOLIC PANEL - Abnormal; Notable for the following components:   Glucose, Bld 158 (*)    Alkaline Phosphatase 158 (*)    All other components within normal limits  CBC - Abnormal; Notable for the following components:   WBC 11.8 (*)    Hemoglobin 11.5 (*)    HCT 35.8 (*)    All other components within normal limits  URINALYSIS, ROUTINE W REFLEX MICROSCOPIC - Abnormal; Notable for the following components:   APPearance HAZY (*)    Hgb urine dipstick LARGE (*)    Protein, ur 30 (*)    RBC / HPF >50 (*)    All other components within normal limits  PREGNANCY, URINE                                                                                                                          EKG  EKG Interpretation  Date/Time:  Friday July 13 2022 01:21:17 EST Ventricular Rate:  96 PR Interval:  176 QRS Duration: 92 QT Interval:  370 QTC Calculation: 468 R Axis:   -39 Text Interpretation: Sinus rhythm Left axis deviation Borderline T abnormalities, anterior leads Baseline wander in lead(s) I II III aVR aVF V1 V2 V3 V4 V5 V6 Confirmed by Addison Lank [phone]) on 07/13/2022 3:39:12 AM       Radiology CT Renal Stone Study  Result Date: 07/13/2022 CLINICAL DATA:  Flank pain, initial encounter EXAM: CT ABDOMEN AND PELVIS WITHOUT CONTRAST TECHNIQUE: Multidetector CT imaging of the abdomen and pelvis was performed following the standard protocol without IV contrast. RADIATION DOSE REDUCTION: This exam was performed according to the departmental dose-optimization program which includes automated exposure control, adjustment of  the mA and/or kV according to patient size and/or use of iterative reconstruction technique. COMPARISON:  None Available. FINDINGS: Lower chest: No acute abnormality. Hepatobiliary: Gallbladder has been surgically removed. Liver is decreased in attenuation consistent with fatty infiltration. Pancreas: Unremarkable. No pancreatic ductal dilatation or surrounding inflammatory changes. Spleen: Normal in size without focal abnormality. Adrenals/Urinary Tract: Adrenal glands are within normal limits. Kidneys are well visualized bilaterally. Nonobstructing right renal stone is noted in the lower pole measuring 3 mm. The right ureter is within normal limits. Left ureter shows mild dilatation which extends to the level of the left UVJ. A small 3 mm stone is identified causing obstructive change. The bladder is decompressed. Stomach/Bowel: No obstructive or inflammatory changes of the colon are seen. The appendix has been surgically removed. Small bowel and stomach are unremarkable. Vascular/Lymphatic: No significant vascular findings are present. No enlarged abdominal or  pelvic lymph nodes. Reproductive: Uterus is within normal limits. IUD is noted in place. No adnexal mass is seen. Other: Small fat containing umbilical hernia is noted. No bowel is noted within. No free fluid in the pelvis is noted. Musculoskeletal: No acute or significant osseous findings. IMPRESSION: 3 mm distal left ureteral stone with mild obstructive change. Nonobstructing 3 mm right renal stone. Fat containing umbilical hernia.  No bowel is noted within. Electronically Signed   By: Inez Catalina M.D.   On: 07/13/2022 03:33    Medications Ordered in ED Medications  ondansetron (ZOFRAN) injection 4 mg (has no administration in time range)  tamsulosin (FLOMAX) capsule 0.4 mg (has no administration in time range)  metoCLOPramide (REGLAN) injection 10 mg (10 mg Intravenous Given 07/13/22 0155)  ketorolac (TORADOL) 15 MG/ML injection 15 mg (15 mg Intravenous Given 07/13/22 0154)                                                                                                                                     Procedures Procedures  (including critical care time)  Medical Decision Making / ED Course   Medical Decision Making Amount and/or Complexity of Data Reviewed Labs: ordered. Decision-making details documented in ED Course. Radiology: ordered and independent interpretation performed. Decision-making details documented in ED Course. ECG/medicine tests: ordered and independent interpretation performed. Decision-making details documented in ED Course.  Risk Prescription drug management.    Left flank pain.  Differential includes but not limited to renal colic, pyelonephritis, gastritis, pancreatitis, colitis, bowel obstruction, incarcerated hernia.  Clinically favoring renal colic.  Treated as such.  This provided near complete resolution of the patient's pain.  CBC with leukocytosis.  Mild anemia. Metabolic panel without significant electrolyte derangements.  Mild hyperglycemia  without evidence of DKA.  No renal sufficiency.  No evidence of bili obstruction.  Mildly elevated lipase but not consistent with pancreatitis.  UA with hematuria.  No evidence of infection. CT confirmed 3 mm left UVJ stone.  No other evidence of intra-abdominal inflammatory/infectious process or  bowel obstruction.  Patient does have fat-containing umbilical hernia which is not contributing to her clinical picture.  Pain remained controlled.      Final Clinical Impression(s) / ED Diagnoses Final diagnoses:  Left ureteral stone  Renal colic on left side   The patient appears reasonably screened and/or stabilized for discharge and I doubt any other medical condition or other [facility] requiring further screening, evaluation, or treatment in the ED at this time. I have discussed the findings, Dx and Tx plan with the patient/family who expressed understanding and agree(s) with the plan. Discharge instructions discussed at length. The patient/family was given strict return precautions who verbalized understanding of the instructions. No further questions at time of discharge.  Disposition: Discharge  Condition: Good  ED Discharge Orders          Ordered    ondansetron (ZOFRAN-ODT) 4 MG disintegrating tablet  Every 8 hours PRN        07/13/22 0349    tamsulosin (FLOMAX) 0.4 MG CAPS capsule  Daily        07/13/22 0349            Follow Up: Binnie Rail, MD [place] [address] [phone]  Call  as needed  Janith Lima, [place] [address] [phone]  Call  to schedule an appointment for close follow up           This chart was dictated using voice recognition software.  Despite best efforts to proofread,  errors can occur which can change the documentation meaning.    Fatima Blank, MD 07/13/22 [phone]

## 2022-07-13 NOTE — ED Notes (Signed)
Pt returned from CT scan.

## 2022-07-24 ENCOUNTER — Other Ambulatory Visit: Payer: Self-pay

## 2022-07-24 ENCOUNTER — Other Ambulatory Visit (HOSPITAL_COMMUNITY): Payer: Self-pay

## 2022-07-24 ENCOUNTER — Other Ambulatory Visit: Payer: Self-pay | Admitting: Internal Medicine

## 2022-07-24 IMAGING — DX DG KNEE COMPLETE 4+V*R*
4 series · 4 of 4 positions shown · non-contrast
Comparison: None.

CLINICAL DATA: fall

EXAM:
RIGHT KNEE - COMPLETE 4+ VIEW

[knee lat]
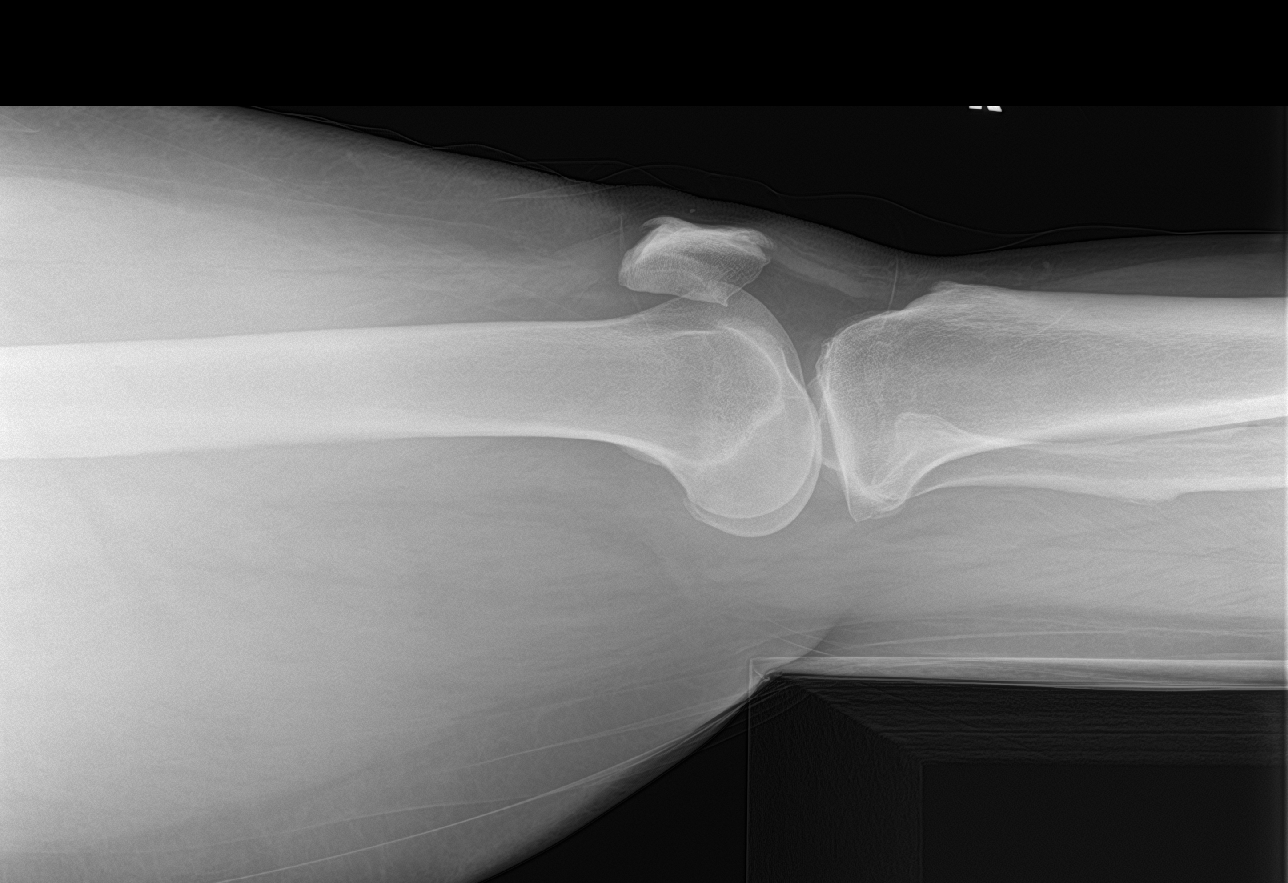

[knee obl (1 of 2)]
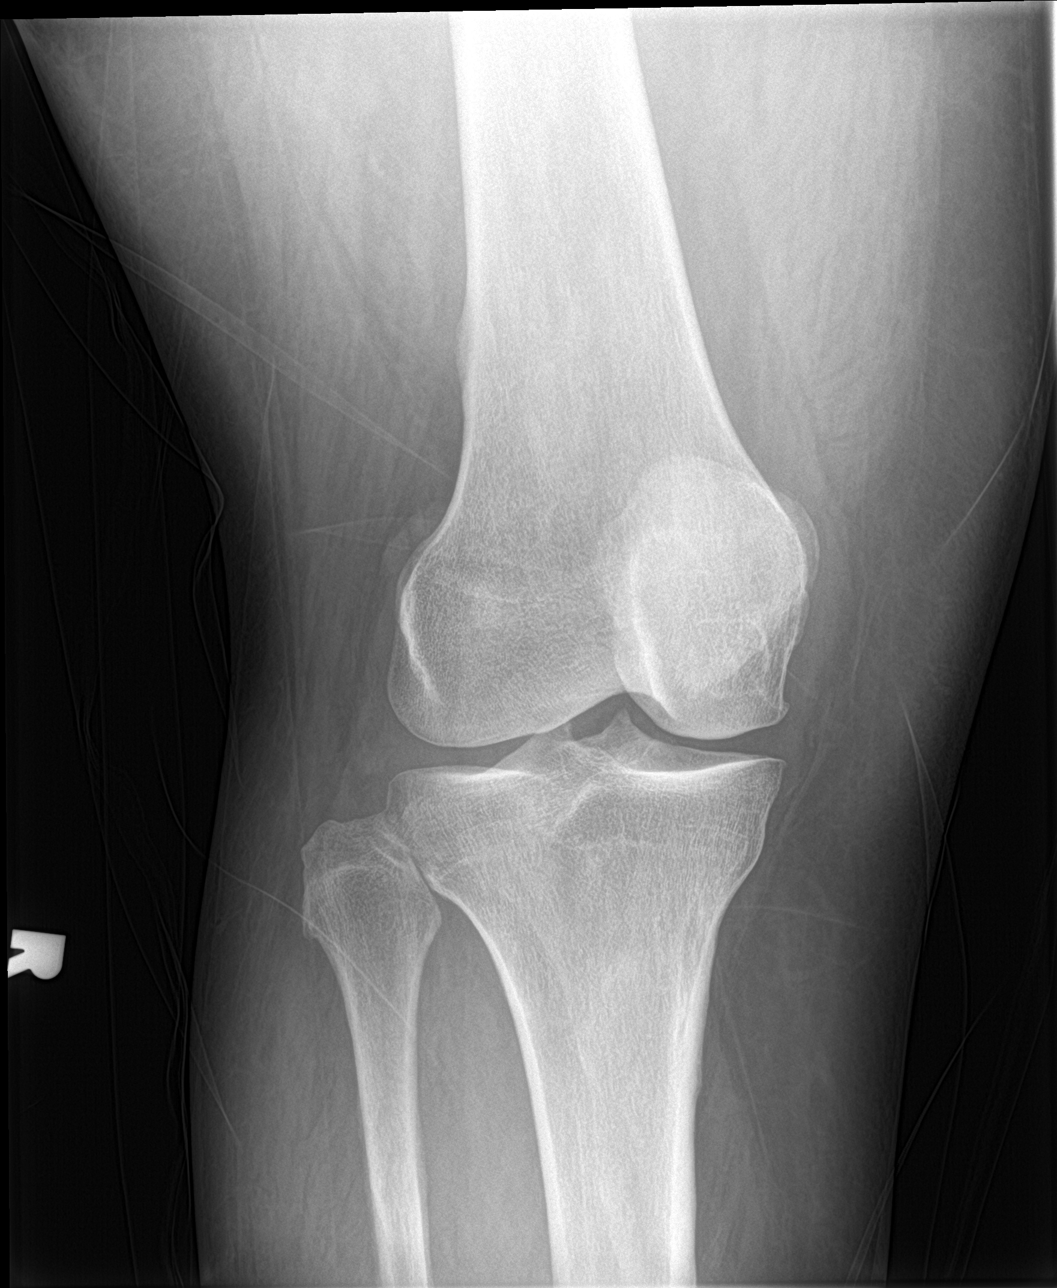

[knee obl (2 of 2)]
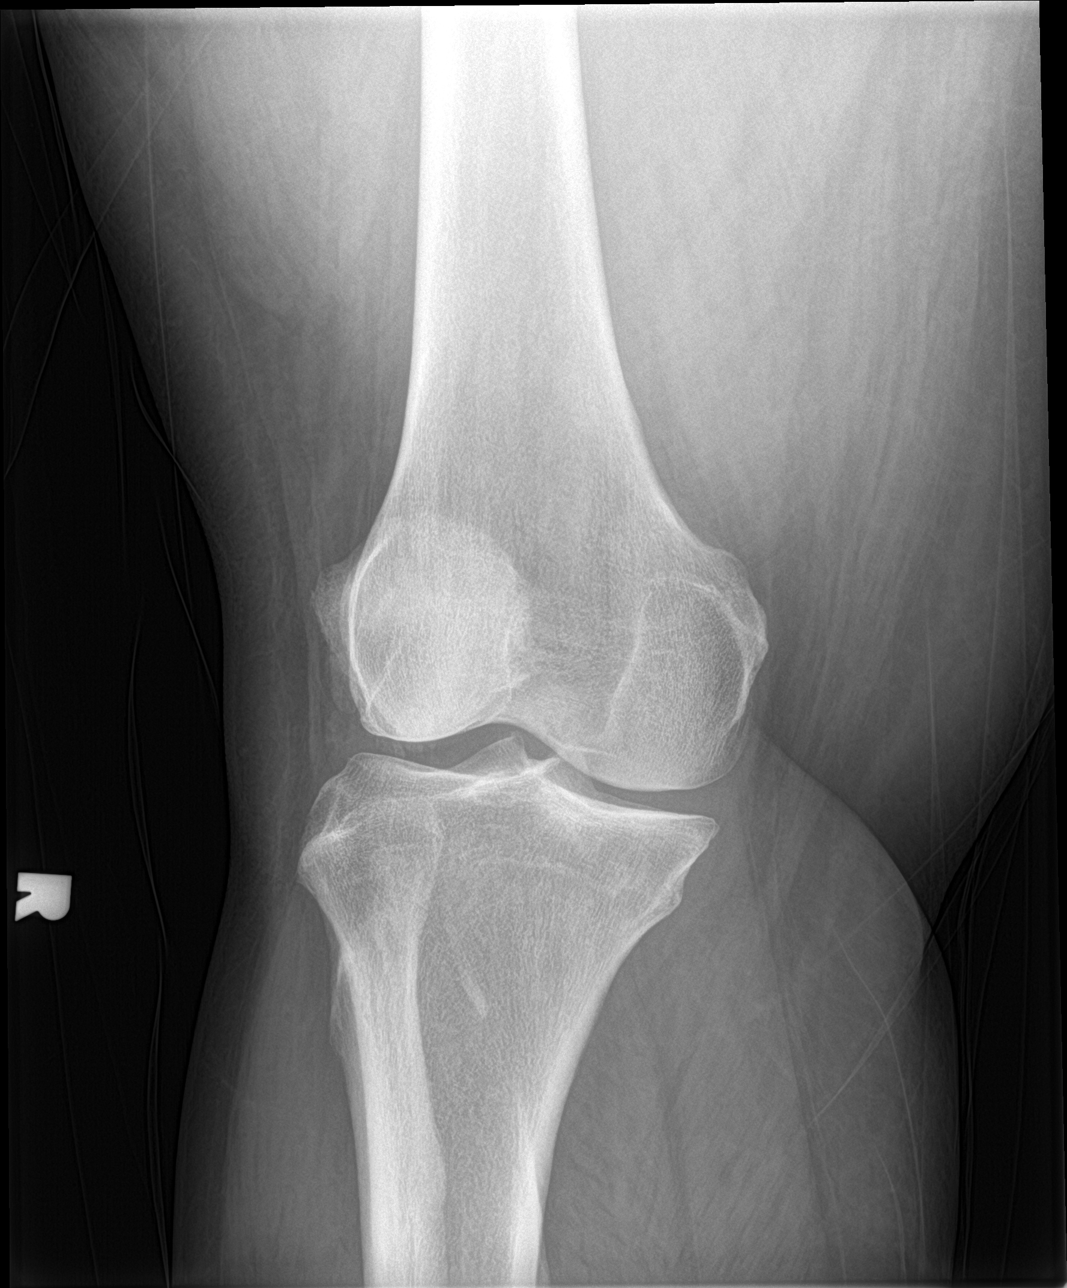

[knee ap]
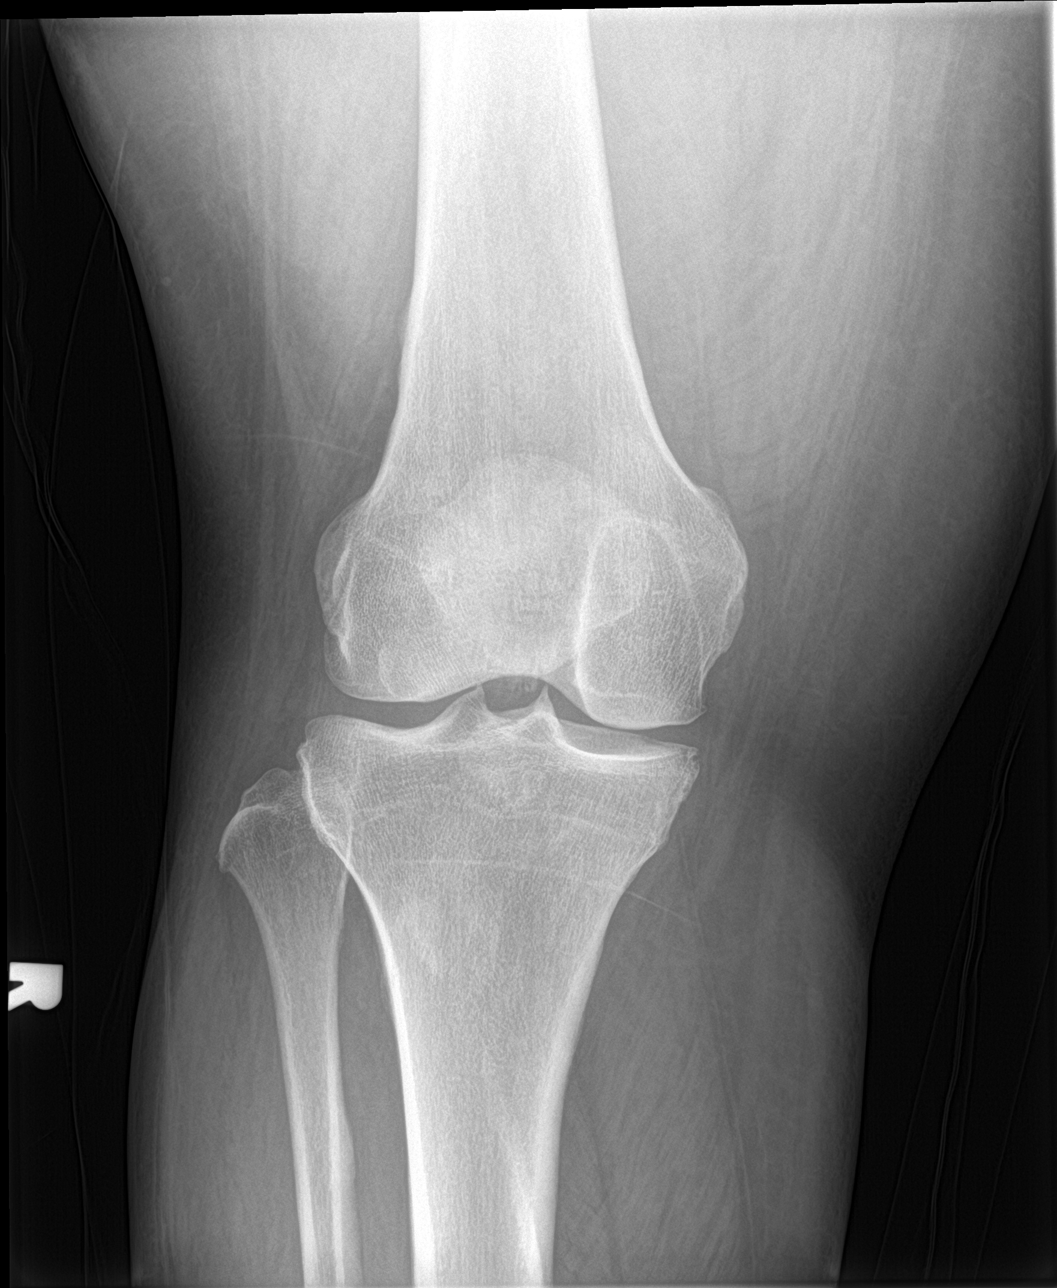

[4 of 4 positions shown; findings below may reference images not displayed]

FINDINGS: Normal alignment. No acute fracture. Patellar enthesopathy. Normal
mineralization. Anterior to the patella, there is a punctate
(approximately 2 mm) radiopaque object. No joint effusion.
IMPRESSION: There is a punctate 2 mm radiopaque object anterior to the patella,
which may represent imbedded foreign body in the soft tissues.
Correlate clinically.

No malalignment or acute fracture.

## 2022-07-24 MED ORDER — MELOXICAM 15 MG PO TABS
15.0000 mg | ORAL_TABLET | Freq: Every day | ORAL | 3 refills | Status: DC
  Filled 2022-07-24: qty 30, 30d supply, fill #0
  Filled 2022-08-13: qty 30, 30d supply, fill #1
  Filled 2022-09-07: qty 30, 30d supply, fill #2
  Filled 2022-10-18: qty 30, 30d supply, fill #3

## 2022-07-25 ENCOUNTER — Encounter: Payer: Self-pay | Admitting: Internal Medicine

## 2022-07-25 ENCOUNTER — Other Ambulatory Visit (HOSPITAL_COMMUNITY): Payer: Self-pay

## 2022-07-27 ENCOUNTER — Other Ambulatory Visit: Payer: Self-pay

## 2022-07-27 ENCOUNTER — Emergency Department (HOSPITAL_BASED_OUTPATIENT_CLINIC_OR_DEPARTMENT_OTHER)
Admission: EM | Admit: 2022-07-27 | Discharge: 2022-07-27 | Disposition: A | Discharge status: Home / Self Care | Payer: 59 | Attending: Emergency Medicine | Admitting: Emergency Medicine

## 2022-07-27 ENCOUNTER — Encounter (HOSPITAL_BASED_OUTPATIENT_CLINIC_OR_DEPARTMENT_OTHER): Payer: Self-pay

## 2022-07-27 DIAGNOSIS — E039 Hypothyroidism, unspecified: Secondary | ICD-10-CM | POA: Insufficient documentation

## 2022-07-27 DIAGNOSIS — I10 Essential (primary) hypertension: Secondary | ICD-10-CM | POA: Insufficient documentation

## 2022-07-27 DIAGNOSIS — Z8616 Personal history of COVID-19: Secondary | ICD-10-CM | POA: Insufficient documentation

## 2022-07-27 DIAGNOSIS — Z79899 Other long term (current) drug therapy: Secondary | ICD-10-CM | POA: Diagnosis not present

## 2022-07-27 DIAGNOSIS — J029 Acute pharyngitis, unspecified: Secondary | ICD-10-CM | POA: Diagnosis present

## 2022-07-27 DIAGNOSIS — U071 COVID-19: Secondary | ICD-10-CM | POA: Insufficient documentation

## 2022-07-27 DIAGNOSIS — J020 Streptococcal pharyngitis: Secondary | ICD-10-CM | POA: Insufficient documentation

## 2022-07-27 DIAGNOSIS — N202 Calculus of kidney with calculus of ureter: Secondary | ICD-10-CM | POA: Diagnosis not present

## 2022-07-27 DIAGNOSIS — R102 Pelvic and perineal pain: Secondary | ICD-10-CM | POA: Diagnosis not present

## 2022-07-27 DIAGNOSIS — Z794 Long term (current) use of insulin: Secondary | ICD-10-CM | POA: Insufficient documentation

## 2022-07-27 DIAGNOSIS — E119 Type 2 diabetes mellitus without complications: Secondary | ICD-10-CM | POA: Diagnosis not present

## 2022-07-27 LAB — RESP PANEL BY RT-PCR (RSV, FLU A&B, COVID)  RVPGX2
Influenza A by PCR: NEGATIVE
Influenza B by PCR: NEGATIVE
Resp Syncytial Virus by PCR: NEGATIVE
SARS Coronavirus 2 by RT PCR: POSITIVE — AB

## 2022-07-27 LAB — GROUP A STREP BY PCR: Group A Strep by PCR: DETECTED — AB

## 2022-07-27 MED ORDER — PAXLOVID (300/100) 20 X 150 MG & 10 X 100MG PO TBPK: 3.0000 | ORAL_TABLET | Freq: Two times a day (BID) | ORAL | 0 refills | Status: AC

## 2022-07-27 MED ORDER — AMOXICILLIN 500 MG PO CAPS: 500.0000 mg | ORAL_CAPSULE | Freq: Two times a day (BID) | ORAL | 0 refills | Status: AC

## 2022-07-27 NOTE — Discharge Instructions (Addendum)
We evaluated you for your sore throat, congestion, nausea and diarrhea.  Your COVID test and your strep test were both positive.  We have prescribed you Paxlovid for COVID.  Please hold your Crestor until you complete your course of Paxlovid.  We have prescribed you antibiotics for your strep throat.  Please take this twice a day for 10 days.

## 2022-07-27 NOTE — ED Triage Notes (Signed)
Pt presents POV from home.  Pt woke with sore throat this morning, sinus drainage, chills and feeling exhausted.  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

## 2022-07-29 DIAGNOSIS — G4733 Obstructive sleep apnea (adult) (pediatric): Secondary | ICD-10-CM | POA: Diagnosis not present

## 2022-07-29 DIAGNOSIS — G4731 Primary central sleep apnea: Secondary | ICD-10-CM | POA: Diagnosis not present

## 2022-08-02 NOTE — Progress Notes (Signed)
PATIENT: Erin [name] DOB: 1966-05-08  REASON FOR VISIT: follow up HISTORY FROM: patient  Chief Complaint  Patient presents with   Rm 19    Here alone for CPAP f/u. She would like to discuss decreasing the pressure. She states it still causes her stomach to be nauseous. ESS 14   '  HISTORY OF PRESENT ILLNESS: Today 08/06/22:  Erin [name] is a 57 y.o. female with a history of OSA on CPAP. Returns today for follow-up.  Reports that she is the pressure may be a little too high causing air into her stomach and feeling nauseous.  Reports that CPAP continues to work well for her.  States that she sleeps well on the machine.  Download is below       08/03/21: Erin [name] is a 57 year old female with a history of obstructive sleep apnea on CPAP.  Reports that the CPAP is working for her.  She would like for her pressure to be lower.  She states that she is working with her primary care for her headaches.  States that they have improved slightly since starting CPAP therapy feels that her headaches may be related to eye pressure.  Returns today for an evaluation.    08/03/20: Erin [name] is a 58 year old female with a history of obstructive sleep apnea on CPAP.  She reports that the CPAP is working well.  She denies any new issues.  She returns today for an evaluation.   Compliance Report Usage 07/04/2020 - 08/02/2020 Usage days 30/30 days (100%) >= 4 hours 30 days (100%) Average usage (days used) 8 hours 10 minutes  AirSense 10 AutoSet Serial number [id number] Mode AutoSet Min Pressure 7 cmH2O Max Pressure 14 cmH2O EPR Fulltime EPR level 2 Response Standard  Therapy Pressure - cmH2O Median: 9.6 95th percentile: 12.2 Maximum: 13.3 Leaks - L/min Median: 0.4 95th percentile: 11.8 Maximum: 37.6 Events per hour AI: 0.1 HI: 0.0 AHI: 0.1 Apnea Index Central: 0.0 Obstructive: 0.0 Unknown: 0.0  REVIEW OF SYSTEMS: Out of a complete 14 system review of symptoms, the patient  complains only of the following symptoms, and all other reviewed systems are negative.  ESS 8  ALLERGIES: Allergies  Allergen Reactions   Prednisone Other (See Comments)    Raises blood sugar so high that she receives intensive care   Fluoxetine    Glimepiride     Stomach upset   Prozac [Fluoxetine Hcl] Nausea Only   Hydrocodone Hives, Itching and Rash    On thighs    Metformin And Related     diarrhea    HOME MEDICATIONS: Outpatient Medications Prior to Visit  Medication Sig Dispense Refill   ALPRAZolam (XANAX) 0.5 MG tablet Take 1 tablet (0.5 mg total) by mouth 2 (two) times daily as needed for anxiety. 60 tablet 0   amoxicillin (AMOXIL) 500 MG capsule Take 1 capsule (500 mg total) by mouth 2 (two) times daily for 10 days. 20 capsule 0   Blood Glucose Monitoring Suppl (FREESTYLE LITE) w/Device KIT Use as directed. 1 kit 0   cholecalciferol (VITAMIN D) 1000 units tablet Take 1,000 Units by mouth daily.     clotrimazole (LOTRIMIN) 1 % cream Apply 1 application topically 2 (two) times daily. Use for 2 weeks at a time as needed. 30 g 2   diltiazem (TIAZAC) 120 MG 24 hr capsule Take 1 capsule (120 mg total) by mouth daily. 90 capsule 3   diphenoxylate-atropine (LOMOTIL) 2.5-0.025 MG tablet Take 2 tablets by  mouth 4 (four) times daily as needed for diarrhea or loose stools. 20 tablet 0   docusate sodium (COLACE) 100 MG capsule Take 1-3 capsules (100-300 mg total) by mouth daily as needed for mild constipation. 90 capsule 5   Dulaglutide (TRULICITY) 4.5 0000000 SOPN Inject 4.5 mg as directed once a week. 6 mL 1   famotidine (PEPCID) 20 MG tablet Take 1 tablet (20 mg total) by mouth 2 (two) times daily. 180 tablet 3   glucose blood (FREESTYLE LITE) test strip Use as instructed 100 each 12   Lancets (FREESTYLE) lancets Use as instructed 100 each 12   levonorgestrel (MIRENA) 20 MCG/24HR IUD 1 each by Intrauterine route once.      losartan (COZAAR) 100 MG tablet Take 1 tablet (100 mg  total) by mouth daily. 90 tablet 3   meloxicam (MOBIC) 15 MG tablet Take 1 tablet (15 mg total) by mouth daily. 30 tablet 3   methocarbamol (ROBAXIN) 750 MG tablet Take 1 tablet (750 mg total) by mouth 4 (four) times daily. 120 tablet 2   Multiple Vitamin (MULTIVITAMIN) tablet Take 1 tablet by mouth daily.     rosuvastatin (CRESTOR) 10 MG tablet Take 1 tablet (10 mg total) by mouth daily. 90 tablet 3   UNITHROID 150 MCG tablet Take 1 tablet (150 mcg total) by mouth daily. 90 tablet 3   No facility-administered medications prior to visit.    PAST MEDICAL HISTORY: Past Medical History:  Diagnosis Date   Abnormal uterine bleeding    Anemia    COVID 12/2018   Depression    Diabetes mellitus without complication (HCC)    gestational   Elevated blood-pressure reading without diagnosis of hypertension    Endometriosis    Fibroids    GERD (gastroesophageal reflux disease)    Graves disease    Grief at loss of child    H/O gestational diabetes mellitus, not currently pregnant 09/28/2015   Hypertension    Hypothyroidism    Interstitial cystitis    Menorrhagia    Oropharyngeal candidiasis 01/16/2018   OSA (obstructive sleep apnea), severe 06/23/2018   Premature ventricular contractions    a. rare PVC by monitor 12/2016.   Right ovarian cyst    3 cm.   Thyroid disease    Uterine leiomyoma     PAST SURGICAL HISTORY: Past Surgical History:  Procedure Laterality Date   APPENDECTOMY     ARTERY BIOPSY Left 10/25/2017   Procedure: BIOPSY TEMPORAL ARTERY;  Surgeon: Aviva Signs, MD;  Location: [facility];  Service: General;  Laterality: Left;   CESAREAN SECTION     CHOLECYSTECTOMY     HERNIA REPAIR     incisional   TUMOR REMOVAL  fibroids    FAMILY HISTORY: Family History  Problem Relation Age of Onset   Hypertension Mother    Cancer Mother    Breast cancer Mother 34   Hypertension Father    Diabetes Father    Cancer Father    Ovarian cancer Maternal Aunt    Thyroid disease  Maternal Aunt        hypothyroid    SOCIAL HISTORY: Social History   Socioeconomic History   Marital status: Married    Spouse name: Not on file   Number of children: Not on file   Years of education: Not on file   Highest education level: Not on file  Occupational History   Not on file  Tobacco Use   Smoking status: Never   Smokeless tobacco: Never  Vaping Use   Vaping Use: Never used  Substance and Sexual Activity   Alcohol use: No   Drug use: No   Sexual activity: Never    Birth control/protection: I.U.D.    Comment: Mirena inserted 2015/2016  Other Topics Concern   Not on file  Social History Narrative   Not on file   Social Determinants of Health   Financial Resource Strain: Not on file  Food Insecurity: Not on file  Transportation Needs: Not on file  Physical Activity: Not on file  Stress: Not on file  Social Connections: Not on file  Intimate Partner Violence: Not on file      PHYSICAL EXAM  Vitals:   08/06/22 1425  BP: 110/74  Pulse: 95  Weight: (!) 349 lb (158.3 kg)  Height: 5' 5"$  (1.651 m)    Body mass index is 58.08 kg/m.  Generalized: Well developed, in no acute distress  Chest: Lungs clear to auscultation bilaterally  Neurological examination  Mentation: Alert oriented to time, place, history taking. Follows all commands speech and language fluent Cranial nerve II-XII: Extraocular movements were full, visual field were full on confrontational test Head turning and shoulder shrug  were normal and symmetric. Motor: The motor testing reveals 5 over 5 strength of all 4 extremities. Good symmetric motor tone is noted throughout.  Sensory: Sensory testing is intact to soft touch on all 4 extremities. No evidence of extinction is noted.  Gait and station: Gait is normal.    DIAGNOSTIC DATA (LABS, IMAGING, TESTING) - I reviewed patient records, labs, notes, testing and imaging myself where available.  Lab Results  Component Value Date    WBC 11.8 (H) 07/13/2022   HGB 11.5 (L) 07/13/2022   HCT 35.8 (L) 07/13/2022   MCV 84.8 07/13/2022   PLT 301 07/13/2022      Component Value Date/Time   NA 141 07/13/2022 0141   NA 140 12/05/2016 1616   K 3.6 07/13/2022 0141   CL 106 07/13/2022 0141   CO2 24 07/13/2022 0141   GLUCOSE 158 (H) 07/13/2022 0141   BUN 20 07/13/2022 0141   BUN 12 12/05/2016 1616   CREATININE 0.86 07/13/2022 0141   CREATININE 0.82 01/14/2019 0943   CALCIUM 9.4 07/13/2022 0141   PROT 7.8 07/13/2022 0141   ALBUMIN 4.1 07/13/2022 0141   AST 27 07/13/2022 0141   AST 14 (L) 01/14/2019 0943   ALT 32 07/13/2022 0141   ALT 19 01/14/2019 0943   ALKPHOS 158 (H) 07/13/2022 0141   BILITOT 0.7 07/13/2022 0141   BILITOT 0.5 01/14/2019 0943   GFRNONAA >60 07/13/2022 0141   GFRNONAA >60 01/14/2019 0943   GFRAA >60 01/14/2019 0943   Lab Results  Component Value Date   CHOL 121 05/25/2022   HDL 42.90 05/25/2022   LDLCALC 66 05/25/2022   TRIG 61.0 05/25/2022   CHOLHDL 3 05/25/2022   Lab Results  Component Value Date   HGBA1C 7.6 (H) 05/25/2022   Lab Results  Component Value Date   VITAMINB12 705 01/14/2019   Lab Results  Component Value Date   TSH 3.27 10/05/2021      ASSESSMENT AND PLAN 57 y.o. year old female  has a past medical history of Abnormal uterine bleeding, Anemia, COVID (12/2018), Depression, Diabetes mellitus without complication ([place]), Elevated blood-pressure reading without diagnosis of hypertension, Endometriosis, Fibroids, GERD (gastroesophageal reflux disease), Graves disease, Grief at loss of child, H/O gestational diabetes mellitus, not currently pregnant (09/28/2015), Hypertension, Hypothyroidism, Interstitial cystitis, Menorrhagia, Oropharyngeal candidiasis (  01/16/2018), OSA (obstructive sleep apnea), severe (06/23/2018), Premature ventricular contractions, Right ovarian cyst, Thyroid disease, and Uterine leiomyoma. here with:  OSA on CPAP  - CPAP compliance excellent - Good  treatment of AHI  -We will decrease pressure 7-12cmH2O increase EPR 3 - Encourage patient to use CPAP nightly and > 4 hours each night - F/U in 1 year or sooner if needed    Ward Givens, MSN, NP-C 08/06/2022, 2:19 PM [facility] [address], [place], [place] [address] [phone]

## 2022-08-06 ENCOUNTER — Encounter: Payer: Self-pay | Admitting: Internal Medicine

## 2022-08-06 ENCOUNTER — Encounter: Payer: Self-pay | Admitting: Adult Health

## 2022-08-06 ENCOUNTER — Ambulatory Visit (INDEPENDENT_AMBULATORY_CARE_PROVIDER_SITE_OTHER): Payer: 59 | Admitting: Adult Health

## 2022-08-06 VITALS — BP 110/74 | HR 95 | Ht 65.0 in | Wt 349.0 lb

## 2022-08-06 DIAGNOSIS — G4733 Obstructive sleep apnea (adult) (pediatric): Secondary | ICD-10-CM

## 2022-08-06 NOTE — ED Provider Notes (Signed)
[facility] Provider Note  CSN: [id number] Arrival date & time: 07/27/22 1914  Chief Complaint(s) Sore Throat  HPI Erin [name] is a 57 y.o. female with history of hypertension, hypothyroidism, diabetes presenting to the emergency department with sore throat.  Reports sore throat began this morning.  Reports associated fatigue, body aches, runny nose.  No cough.  No nausea or vomiting.  No chest pain, shortness of breath.  No abdominal pain.  No diarrhea.  Symptoms are mild.  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   Past Medical History Past Medical History:  Diagnosis Date   Abnormal uterine bleeding    Anemia    COVID 12/2018   Depression    Diabetes mellitus without complication (HCC)    gestational   Elevated blood-pressure reading without diagnosis of hypertension    Endometriosis    Fibroids    GERD (gastroesophageal reflux disease)    Graves disease    Grief at loss of child    H/O gestational diabetes mellitus, not currently pregnant 09/28/2015   Hypertension    Hypothyroidism    Interstitial cystitis    Menorrhagia    Oropharyngeal candidiasis 01/16/2018   OSA (obstructive sleep apnea), severe 06/23/2018   Premature ventricular contractions    a. rare PVC by monitor 12/2016.   Right ovarian cyst    3 cm.   Thyroid disease    Uterine leiomyoma    Patient Active Problem List   Diagnosis Date Noted   Type II diabetes mellitus with manifestations ([place]) 12/27/2021   Chronic left hip pain 10/04/2020   De Quervain's tenosynovitis, right 06/16/2019   Patellofemoral arthritis of left knee 11/28/2018   Left knee pain 11/24/2018   Episodic lightheadedness 10/24/2018   Lumbar back pain with radiculopathy affecting left lower extremity 10/07/2018   Right ovarian cyst    OSA (obstructive sleep apnea), severe 06/23/2018   Anxiety 03/05/2018   Chronic nonintractable headache 03/05/2018    Hyperlipidemia 01/23/2018   Diabetes ([place]) 01/16/2018   DKA (diabetic ketoacidoses) 01/15/2018   Gastroesophageal reflux disease 10/05/2017   Temporal arteritis ([place]) 10/03/2017   Hypertension 09/28/2015   Morbid obesity ([place]) 123XX123   Umbilical hernia 123XX123   Palpitations 05/17/2015   Iron deficiency anemia 09/03/2007   Hypothyroidism following radioiodine therapy 09/02/2007   INTERSTITIAL CYSTITIS 09/01/2007   ALLERGIC RHINITIS 08/07/2007   [place] DISEASE, LUMBAR 08/07/2007   Home Medication(s) Prior to Admission medications   Medication Sig Start Date End Date Taking? Authorizing Provider  amoxicillin (AMOXIL) 500 MG capsule Take 1 capsule (500 mg total) by mouth 2 (two) times daily for 10 days. 07/27/22 08/06/22 Yes Cristie Hem, MD  ALPRAZolam Duanne Moron) 0.5 MG tablet Take 1 tablet (0.5 mg total) by mouth 2 (two) times daily as needed for anxiety. 02/01/21   Binnie Rail, MD  Blood Glucose Monitoring Suppl (FREESTYLE LITE) w/Device KIT Use as directed. 05/14/22   Binnie Rail, MD  cholecalciferol (VITAMIN D) 1000 units tablet Take 1,000 Units by mouth daily.    [provider]  clotrimazole (LOTRIMIN) 1 % cream Apply 1 application topically 2 (two) times daily. Use for 2 weeks at a time as needed. 10/05/20   Nunzio Cobbs, MD  diltiazem (TIAZAC) 120 MG 24 hr capsule Take 1 capsule (120 mg total) by mouth daily. 02/11/22   Binnie Rail, MD  diphenoxylate-atropine (LOMOTIL) 2.5-0.025 MG tablet Take  2 tablets by mouth 4 (four) times daily as needed for diarrhea or loose stools. 11/10/21   Binnie Rail, MD  docusate sodium (COLACE) 100 MG capsule Take 1-3 capsules (100-300 mg total) by mouth daily as needed for mild constipation. 05/25/22   Burns, Claudina Lick, MD  Dulaglutide (TRULICITY) 4.5 0000000 SOPN Inject 4.5 mg as directed once a week. 04/30/22   Binnie Rail, MD  famotidine (PEPCID) 20 MG tablet Take 1 tablet (20 mg total) by mouth 2 (two) times  daily. 01/17/22   Binnie Rail, MD  glucose blood (FREESTYLE LITE) test strip Use as instructed 05/14/22   Binnie Rail, MD  Lancets (FREESTYLE) lancets Use as instructed 05/14/22   Binnie Rail, MD  levonorgestrel (MIRENA) 20 MCG/24HR IUD 1 each by Intrauterine route once.  06/26/15   [provider]  losartan (COZAAR) 100 MG tablet Take 1 tablet (100 mg total) by mouth daily. 05/25/22   Binnie Rail, MD  meloxicam (MOBIC) 15 MG tablet Take 1 tablet (15 mg total) by mouth daily. 07/24/22   Binnie Rail, MD  methocarbamol (ROBAXIN) 750 MG tablet Take 1 tablet (750 mg total) by mouth 4 (four) times daily. 06/30/21   Binnie Rail, MD  Multiple Vitamin (MULTIVITAMIN) tablet Take 1 tablet by mouth daily.    [provider]  rosuvastatin (CRESTOR) 10 MG tablet Take 1 tablet (10 mg total) by mouth daily. 09/25/21   Binnie Rail, MD  UNITHROID 150 MCG tablet Take 1 tablet (150 mcg total) by mouth daily. 10/06/21   Philemon Kingdom, MD                                                                                                                                    Past Surgical History Past Surgical History:  Procedure Laterality Date   APPENDECTOMY     ARTERY BIOPSY Left 10/25/2017   Procedure: BIOPSY TEMPORAL ARTERY;  Surgeon: Aviva Signs, MD;  Location: [facility];  Service: General;  Laterality: Left;   CESAREAN SECTION     CHOLECYSTECTOMY     HERNIA REPAIR     incisional   TUMOR REMOVAL  fibroids   Family History Family History  Problem Relation Age of Onset   Hypertension Mother    Cancer Mother    Breast cancer Mother 29   Hypertension Father    Diabetes Father    Cancer Father    Ovarian cancer Maternal Aunt    Thyroid disease Maternal Aunt        hypothyroid    Social History Social History   Tobacco Use   Smoking status: Never   Smokeless tobacco: Never  Vaping Use   Vaping Use: Never used  Substance Use Topics   Alcohol use: No   Drug use: No    Allergies Prednisone, Fluoxetine, Glimepiride, Prozac [fluoxetine hcl], Hydrocodone, and Metformin and related  Review of  Systems Review of Systems  All other systems reviewed and are negative.   Physical Exam Vital Signs  I have reviewed the triage vital signs BP (!) 160/93   Pulse (!) 103   Temp 99.5 F (37.5 C) (Oral)   Resp 18   SpO2 98%  Physical Exam Vitals and nursing note reviewed.  Constitutional:      General: She is not in acute distress.    Appearance: She is well-developed.  HENT:     Head: Normocephalic and atraumatic.     Mouth/Throat:     Mouth: Mucous membranes are moist.     Pharynx: Uvula midline. Posterior oropharyngeal erythema present. No pharyngeal swelling or uvula swelling.     Tonsils: No tonsillar abscesses. 2+ on the right. 2+ on the left.  Eyes:     Pupils: Pupils are equal, round, and reactive to light.  Cardiovascular:     Rate and Rhythm: Normal rate and regular rhythm.     Heart sounds: No murmur heard. Pulmonary:     Effort: Pulmonary effort is normal. No respiratory distress.     Breath sounds: Normal breath sounds.  Abdominal:     General: Abdomen is flat.     Palpations: Abdomen is soft.     Tenderness: There is no abdominal tenderness.  Musculoskeletal:        General: No tenderness.     Right lower leg: No edema.     Left lower leg: No edema.  Skin:    General: Skin is warm and dry.  Neurological:     General: No focal deficit present.     Mental Status: She is alert. Mental status is at baseline.  Psychiatric:        Mood and Affect: Mood normal.        Behavior: Behavior normal.     ED Results and Treatments Labs (all labs ordered are listed, but only abnormal results are displayed) Labs Reviewed  GROUP A STREP BY PCR - Abnormal; Notable for the following components:      Result Value   Group A Strep by PCR DETECTED (*)    All other components within normal limits  RESP PANEL BY RT-PCR (RSV, FLU A&B, COVID)   RVPGX2 - Abnormal; Notable for the following components:   SARS Coronavirus 2 by RT PCR POSITIVE (*)    All other components within normal limits                                                                                                                          Radiology No results found.  Pertinent labs & imaging results that were available during my care of the patient were reviewed by me and considered in my medical decision making (see MDM for details).  Medications Ordered in ED Medications - No data to display  Procedures Procedures  (including critical care time)  Medical Decision Making / ED Course   MDM:  57 year old female presenting to the emergency department sore throat.  Patient well-appearing, physical exam with some posterior pharyngeal erythema without evidence of tonsillar abscess, Ludwig's angina,, retropharyngeal abscess.  Workup notable for both positive group A strep PCR and COVID PCR.  Either of these could potentially be causing her symptoms.  Suspect primary process is most likely COVID but will treat for strep with amoxicillin.  Given comorbidities will also treat for COVID with Paxlovid. Will discharge patient to home. All questions answered. Patient comfortable with plan of discharge. Return precautions discussed with patient and specified on the after visit summary.       Additional history obtained: -Additional history obtained from family -External records from outside source obtained and reviewed including: Chart review including previous notes, labs, imaging, consultation notes including ED note 04/20/22   Lab Tests: -I ordered, reviewed, and interpreted labs.   The pertinent results include:   Labs Reviewed  GROUP A STREP BY PCR - Abnormal; Notable for the following components:      Result Value   Group A  Strep by PCR DETECTED (*)    All other components within normal limits  RESP PANEL BY RT-PCR (RSV, FLU A&B, COVID)  RVPGX2 - Abnormal; Notable for the following components:   SARS Coronavirus 2 by RT PCR POSITIVE (*)    All other components within normal limits    Notable for positive COVID, positive strep PCR   Medicines ordered and prescription drug management: Meds ordered this encounter  Medications   nirmatrelvir & ritonavir (PAXLOVID, 300/100,) 20 x 150 MG & 10 x 100MG TBPK    Sig: Take 3 tablets by mouth 2 (two) times daily for 5 days.    Dispense:  30 tablet    Refill:  0   amoxicillin (AMOXIL) 500 MG capsule    Sig: Take 1 capsule (500 mg total) by mouth 2 (two) times daily for 10 days.    Dispense:  20 capsule    Refill:  0    -I have reviewed the patients home medicines and have made adjustments as needed   Social Determinants of Health:  Diagnosis or treatment significantly limited by social determinants of health: obesity   Reevaluation: After the interventions noted above, I reevaluated the patient and found that they have improved  Co morbidities that complicate the patient evaluation  Past Medical History:  Diagnosis Date   Abnormal uterine bleeding    Anemia    COVID 12/2018   Depression    Diabetes mellitus without complication (HCC)    gestational   Elevated blood-pressure reading without diagnosis of hypertension    Endometriosis    Fibroids    GERD (gastroesophageal reflux disease)    Graves disease    Grief at loss of child    H/O gestational diabetes mellitus, not currently pregnant 09/28/2015   Hypertension    Hypothyroidism    Interstitial cystitis    Menorrhagia    Oropharyngeal candidiasis 01/16/2018   OSA (obstructive sleep apnea), severe 06/23/2018   Premature ventricular contractions    a. rare PVC by monitor 12/2016.   Right ovarian cyst    3 cm.   Thyroid disease    Uterine leiomyoma       Dispostion: Disposition decision  including need for hospitalization was considered, and patient discharged from emergency department.    Final Clinical Impression(s) / ED Diagnoses Final diagnoses:  COVID-19  Strep throat     This chart was dictated using voice recognition software.  Despite best efforts to proofread,  errors can occur which can change the documentation meaning.    Cristie Hem, MD 08/06/22 [phone]

## 2022-08-06 NOTE — Progress Notes (Addendum)
Order for pressure change sent to [facility]. [facility] confirmed receipt of order.

## 2022-08-07 ENCOUNTER — Encounter: Payer: Self-pay | Admitting: Internal Medicine

## 2022-08-09 ENCOUNTER — Encounter: Payer: Self-pay | Admitting: Internal Medicine

## 2022-08-13 ENCOUNTER — Other Ambulatory Visit (HOSPITAL_COMMUNITY): Payer: Self-pay

## 2022-08-13 ENCOUNTER — Other Ambulatory Visit: Payer: Self-pay | Admitting: Internal Medicine

## 2022-08-13 ENCOUNTER — Other Ambulatory Visit: Payer: Self-pay

## 2022-08-13 MED ORDER — METHOCARBAMOL 750 MG PO TABS
750.0000 mg | ORAL_TABLET | Freq: Four times a day (QID) | ORAL | 2 refills | Status: DC
  Filled 2022-08-13: qty 120, 30d supply, fill #0
  Filled 2022-11-13: qty 120, 30d supply, fill #1
  Filled 2023-03-25: qty 120, 30d supply, fill #2

## 2022-08-13 MED ORDER — ROSUVASTATIN CALCIUM 10 MG PO TABS
10.0000 mg | ORAL_TABLET | Freq: Every day | ORAL | 3 refills | Status: DC
  Filled 2022-08-13 – 2022-10-18 (×2): qty 90, 90d supply, fill #0
  Filled 2022-12-25 – 2023-01-18 (×3): qty 90, 90d supply, fill #1
  Filled 2023-04-22: qty 90, 90d supply, fill #2
  Filled 2023-06-23 – 2023-07-19 (×2): qty 90, 90d supply, fill #3

## 2022-08-14 ENCOUNTER — Other Ambulatory Visit (HOSPITAL_COMMUNITY): Payer: Self-pay

## 2022-08-14 ENCOUNTER — Other Ambulatory Visit: Payer: Self-pay

## 2022-08-16 ENCOUNTER — Other Ambulatory Visit (HOSPITAL_COMMUNITY): Payer: Self-pay

## 2022-08-16 DIAGNOSIS — R102 Pelvic and perineal pain: Secondary | ICD-10-CM | POA: Diagnosis not present

## 2022-08-16 DIAGNOSIS — N201 Calculus of ureter: Secondary | ICD-10-CM | POA: Diagnosis not present

## 2022-08-17 ENCOUNTER — Other Ambulatory Visit (HOSPITAL_COMMUNITY): Payer: Self-pay

## 2022-08-22 NOTE — Progress Notes (Signed)
GYNECOLOGY  VISIT   HPI: 57 y.o.   Married  [place] American  female   G7P0 with No LMP recorded. (Menstrual status: IUD).   here for   discuss follow up.  Does not want IUD removed today. Mirena was placed in 2015 or 2016 and is due for removal.  No menstrual bleeding.  Has some night sweats.  [place] 30.1 on 10/05/20.  IUD threads not seen on pelvic exam in office at last visit on 10/05/20.  Hx right ovarian cyst and left hydrosalpinx.  Last US done 11/13/18.  She declined follow up US to date.  CT scan 07/13/22 done for flank pain showed IUD in place in the uterus.  3 mm distal left ureteral stone  and nonobstructing 3 mm right renal stone noted.  Had pinching in her right side.    Has panic attacks.   Lost one daughter 6 years ago.  Lost her first daughter 26 years ago.  GYNECOLOGIC HISTORY: No LMP recorded. (Menstrual status: IUD). Contraception:  IUD 2015/2016 Menopausal hormone therapy:  n/a Last mammogram:  11/16/20 Breast Density category A, BI-RADS CATEGORY 1 neg Last pap smear:   05/02/18 neg: HR HPV neg, 09/04/07 neg        OB History     Gravida  7   Para      Term      Preterm      AB      Living  7      SAB      IAB      Ectopic      Multiple      Live Births                 Patient Active Problem List   Diagnosis Date Noted   Type II diabetes mellitus with manifestations ([place]) 12/27/2021   Chronic left hip pain 10/04/2020   De Quervain's tenosynovitis, right 06/16/2019   Patellofemoral arthritis of left knee 11/28/2018   Left knee pain 11/24/2018   Episodic lightheadedness 10/24/2018   Lumbar back pain with radiculopathy affecting left lower extremity 10/07/2018   Right ovarian cyst    OSA (obstructive sleep apnea), severe 06/23/2018   Anxiety 03/05/2018   Chronic nonintractable headache 03/05/2018   Hyperlipidemia 01/23/2018   Diabetes ([place]) 01/16/2018   DKA (diabetic ketoacidoses) 01/15/2018   Gastroesophageal reflux disease  10/05/2017   Temporal arteritis ([place]) 10/03/2017   Hypertension 09/28/2015   Morbid obesity ([place]) 123XX123   Umbilical hernia 123XX123   Palpitations 05/17/2015   Iron deficiency anemia 09/03/2007   Hypothyroidism following radioiodine therapy 09/02/2007   INTERSTITIAL CYSTITIS 09/01/2007   ALLERGIC RHINITIS 08/07/2007   [place] DISEASE, LUMBAR 08/07/2007    Past Medical History:  Diagnosis Date   Abnormal uterine bleeding    Anemia    COVID 12/2018   Depression    Diabetes mellitus without complication ([place])    gestational   Elevated blood-pressure reading without diagnosis of hypertension    Endometriosis    Fibroids    GERD (gastroesophageal reflux disease)    Graves disease    Grief at loss of child    H/O gestational diabetes mellitus, not currently pregnant 09/28/2015   Hypertension    Hypothyroidism    Interstitial cystitis    Menorrhagia    Oropharyngeal candidiasis 01/16/2018   OSA (obstructive sleep apnea), severe 06/23/2018   Premature ventricular contractions    a. rare PVC by monitor 12/2016.   Right ovarian cyst  3 cm.   Thyroid disease    Uterine leiomyoma     Past Surgical History:  Procedure Laterality Date   APPENDECTOMY     ARTERY BIOPSY Left 10/25/2017   Procedure: BIOPSY TEMPORAL ARTERY;  Surgeon: Aviva Signs, MD;  Location: [facility];  Service: General;  Laterality: Left;   CESAREAN SECTION     CHOLECYSTECTOMY     HERNIA REPAIR     incisional   TUMOR REMOVAL  fibroids    Current Outpatient Medications  Medication Sig Dispense Refill   ALPRAZolam (XANAX) 0.5 MG tablet Take 1 tablet (0.5 mg total) by mouth 2 (two) times daily as needed for anxiety. 60 tablet 0   Blood Glucose Monitoring Suppl (FREESTYLE LITE) w/Device KIT Use as directed. 1 kit 0   cholecalciferol (VITAMIN D) 1000 units tablet Take 1,000 Units by mouth daily.     clotrimazole (LOTRIMIN) 1 % cream Apply 1 application topically 2 (two) times daily. Use for 2 weeks at a time as  needed. 30 g 2   diltiazem (TIAZAC) 120 MG 24 hr capsule Take 1 capsule (120 mg total) by mouth daily. 90 capsule 3   diphenoxylate-atropine (LOMOTIL) 2.5-0.025 MG tablet Take 2 tablets by mouth 4 (four) times daily as needed for diarrhea or loose stools. 20 tablet 0   docusate sodium (COLACE) 100 MG capsule Take 1-3 capsules (100-300 mg total) by mouth daily as needed for mild constipation. 90 capsule 5   Dulaglutide (TRULICITY) 4.5 0000000 SOPN Inject 4.5 mg as directed once a week. 6 mL 1   famotidine (PEPCID) 20 MG tablet Take 1 tablet (20 mg total) by mouth 2 (two) times daily. 180 tablet 3   glucose blood (FREESTYLE LITE) test strip Use as instructed 100 each 12   Lancets (FREESTYLE) lancets Use as instructed 100 each 12   levonorgestrel (MIRENA) 20 MCG/24HR IUD 1 each by Intrauterine route once.      losartan (COZAAR) 100 MG tablet Take 1 tablet (100 mg total) by mouth daily. 90 tablet 3   meloxicam (MOBIC) 15 MG tablet Take 1 tablet (15 mg total) by mouth daily. 30 tablet 3   methocarbamol (ROBAXIN) 750 MG tablet Take 1 tablet (750 mg total) by mouth 4 (four) times daily. 120 tablet 2   Multiple Vitamin (MULTIVITAMIN) tablet Take 1 tablet by mouth daily.     rosuvastatin (CRESTOR) 10 MG tablet Take 1 tablet (10 mg total) by mouth daily. 90 tablet 3   UNITHROID 150 MCG tablet Take 1 tablet (150 mcg total) by mouth daily. 90 tablet 3   No current facility-administered medications for this visit.     ALLERGIES: Prednisone, Fluoxetine, Glimepiride, Prozac [fluoxetine hcl], Hydrocodone, and Metformin and related  Family History  Problem Relation Age of Onset   Hypertension Mother    Cancer Mother    Breast cancer Mother 30   Hypertension Father    Diabetes Father    Cancer Father    Ovarian cancer Maternal Aunt    Thyroid disease Maternal Aunt        hypothyroid    Social History   Socioeconomic History   Marital status: Married    Spouse name: Not on file   Number of  children: Not on file   Years of education: Not on file   Highest education level: Not on file  Occupational History   Not on file  Tobacco Use   Smoking status: Never   Smokeless tobacco: Never  Vaping Use  Vaping Use: Never used  Substance and Sexual Activity   Alcohol use: No   Drug use: No   Sexual activity: Never    Birth control/protection: I.U.D.    Comment: Mirena inserted 2015/2016  Other Topics Concern   Not on file  Social History Narrative   Caffeine: maybe 4 cups/day (tea/coffee)   Social Determinants of Health   Financial Resource Strain: Not on file  Food Insecurity: Not on file  Transportation Needs: Not on file  Physical Activity: Not on file  Stress: Not on file  Social Connections: Not on file  Intimate Partner Violence: Not on file    Review of Systems  All other systems reviewed and are negative.   PHYSICAL EXAMINATION:    BP 118/68 (BP Location: Right Arm, Patient Position: Sitting, Cuff Size: Large)   Ht '5\' 5"'$  (1.651 m)   Wt (!) 350 lb (158.8 kg)   BMI 58.24 kg/m     General appearance: alert, cooperative and appears stated age  ASSESSMENT  Menopausal symptoms.  Expiring or expired Mirena IUD.  Right ovarian cyst.  Left hydrosalpinx. Nephrolithiasis.  Hx panic attacks.  FH breast cancer.   PLAN  We reviewed her care today and the need for IUD removal.  Will check FSH and estradiol today.  Return for annual exam and IUD removal. Patient will also return for pelvic ultrasound.  I discussed treatment options for menopausal symptoms:  HRT, SSRIs, SNRIs.  I did highlight Paxil as an option that is nonhormonal and also treated anxiety and panic disorder.  Risks and benefits reviewed.  Written information provided.  No Rx given today.  She will consider.  She will update her mammogram.   35 min  total time was spent for this patient encounter, including preparation, face-to-face counseling with the patient, coordination of care, and  documentation of the encounter.

## 2022-08-27 DIAGNOSIS — G4731 Primary central sleep apnea: Secondary | ICD-10-CM | POA: Diagnosis not present

## 2022-08-27 DIAGNOSIS — G4733 Obstructive sleep apnea (adult) (pediatric): Secondary | ICD-10-CM | POA: Diagnosis not present

## 2022-09-05 ENCOUNTER — Encounter: Payer: Self-pay | Admitting: Obstetrics and Gynecology

## 2022-09-05 ENCOUNTER — Ambulatory Visit: Payer: 59 | Admitting: Obstetrics and Gynecology

## 2022-09-05 VITALS — BP 118/68 | Ht 65.0 in | Wt 350.0 lb

## 2022-09-05 DIAGNOSIS — N83201 Unspecified ovarian cyst, right side: Secondary | ICD-10-CM

## 2022-09-05 DIAGNOSIS — Z975 Presence of (intrauterine) contraceptive device: Secondary | ICD-10-CM

## 2022-09-05 DIAGNOSIS — N7011 Chronic salpingitis: Secondary | ICD-10-CM | POA: Diagnosis not present

## 2022-09-05 DIAGNOSIS — N951 Menopausal and female climacteric states: Secondary | ICD-10-CM | POA: Diagnosis not present

## 2022-09-05 DIAGNOSIS — Z8659 Personal history of other mental and behavioral disorders: Secondary | ICD-10-CM

## 2022-09-05 NOTE — Patient Instructions (Signed)
Paroxetine Tablets What is this medication? PAROXETINE (pa ROX e teen) treats depression, anxiety, obsessive-compulsive disorder (OCD), post-traumatic stress disorder (PTSD), and premenstrual dysphoric disorder (PMDD). It increases the amount of serotonin in the brain, a hormone that helps regulate mood. It belongs to a group of medications called SSRIs. This medicine may be used for other purposes; ask your health care provider or pharmacist if you have questions. COMMON BRAND NAME(S): Paxil, Pexeva What should I tell my care team before I take this medication? They need to know if you have any of these conditions: Bipolar disorder or a family history of bipolar disorder Bleeding disorders Glaucoma Heart disease Kidney disease Liver disease Low levels of sodium in the blood Seizures Suicidal thoughts, plans, or attempt; a previous suicide attempt by you or a family member Take MAOIs like Carbex, Eldepryl, Marplan, Nardil, and Parnate Take medications that treat or prevent blood clots Thyroid disease An unusual or allergic reaction to paroxetine, other medications, foods, dyes, or preservatives Pregnant or trying to get pregnant Breast-feeding How should I use this medication? Take this medication by mouth with a glass of water. Follow the directions on the prescription label. You can take it with or without food. Take your medication at regular intervals. Do not take your medication more often than directed. Do not stop taking this medication suddenly except upon the advice of your care team. Stopping this medication too quickly may cause serious side effects or your condition may worsen. A special MedGuide will be given to you by the pharmacist with each prescription and refill. Be sure to read this information carefully each time. Talk to your care team regarding the use of this medication in children. Special care may be needed. Overdosage: If you think you have taken too much of this  medicine contact a poison control center or emergency room at once. NOTE: This medicine is only for you. Do not share this medicine with others. What if I miss a dose? If you miss a dose, take it as soon as you can. If it is almost time for your next dose, take only that dose. Do not take double or extra doses. What may interact with this medication? Do not take this medication with any of the following: Linezolid MAOIs like Carbex, Eldepryl, Marplan, Nardil, and Parnate Methylene blue (injected into a vein) Pimozide Thioridazine This medication may also interact with the following: Alcohol Amphetamines Aspirin and aspirin-like medications Atomoxetine Certain medications for depression, anxiety, or psychotic disturbances Certain medications for irregular heart beat like propafenone, flecainide, encainide, and quinidine Certain medications for migraine headache like almotriptan, eletriptan, frovatriptan, naratriptan, rizatriptan, sumatriptan, zolmitriptan Cimetidine Digoxin Diuretics Fentanyl Fosamprenavir Furazolidone Isoniazid Lithium Medications that treat or prevent blood clots like warfarin, enoxaparin, and dalteparin Medications for sleep NSAIDs, medications for pain and inflammation, like ibuprofen or naproxen Phenobarbital Phenytoin Procarbazine Rasagiline Ritonavir Supplements like St. John's wort, kava kava, valerian Tamoxifen Tramadol Tryptophan This list may not describe all possible interactions. Give your health care provider a list of all the medicines, herbs, non-prescription drugs, or dietary supplements you use. Also tell them if you smoke, drink alcohol, or use illegal drugs. Some items may interact with your medicine. What should I watch for while using this medication? Tell your care team if your symptoms do not get better or if they get worse. Visit your care team for regular checks on your progress. Because it may take several weeks to see the full  effects of this medication, it is important  to continue your treatment as prescribed by your care team. Watch for new or worsening thoughts of suicide or depression. This includes sudden changes in mood, behaviors, or thoughts. These changes can happen at any time but are more common in the beginning of treatment or after a change in dose. Call your care team right away if you experience these thoughts or worsening depression. Manic episodes may happen in patients with bipolar disorder who take this medication. Watch for changes in feelings or behaviors such as feeling anxious, nervous, agitated, panicky, irritable, hostile, aggressive, impulsive, severely restless, overly excited and hyperactive, or trouble sleeping. These changes can happen at any time but are more common in the beginning of treatment or after a change in dose. Call your care team right away if you notice any of these symptoms. You may get drowsy or dizzy. Do not drive, use machinery, or do anything that needs mental alertness until you know how this medication affects you. Do not stand or sit up quickly, especially if you are an older patient. This reduces the risk of dizzy or fainting spells. Alcohol may interfere with the effect of this medication. Avoid alcoholic drinks. Your mouth may get dry. Chewing sugarless gum or sucking hard candy, and drinking plenty of water will help. Contact your care team if the problem does not go away or is severe. What side effects may I notice from receiving this medication? Side effects that you should report to your care team as soon as possible: Allergic reactions--skin rash, itching, hives, swelling of the face, lips, tongue, or throat Bleeding--bloody or black, tar-like stools, red or dark [place] urine, vomiting blood or [place] material that looks like coffee grounds, small, red or purple spots on skin, unusual bleeding or bruising Heart rhythm changes--fast or irregular heartbeat, dizziness,  feeling faint or lightheaded, chest pain, trouble breathing Low sodium level--muscle weakness, fatigue, dizziness, headache, confusion Serotonin syndrome--irritability, confusion, fast or irregular heartbeat, muscle stiffness, twitching muscles, sweating, high fever, seizures, chills, vomiting, diarrhea Sudden eye pain or change in vision such as blurry vision, seeing halos around lights, vision loss Thoughts of suicide or self-harm, worsening mood, feelings of depression Side effects that usually do not require medical attention (report to your care team if they continue or are bothersome): Change in sex drive or performance Diarrhea Excessive sweating Nausea Tremors or shaking Upset stomach This list may not describe all possible side effects. Call your doctor for medical advice about side effects. You may report side effects to FDA at 1-800-FDA-1088. Where should I keep my medication? Keep out of the reach of children and pets. Store at room temperature between 15 and 30 degrees C (59 and 86 degrees F). Keep container tightly closed. Throw away any unused medication after the expiration date. NOTE: This sheet is a summary. It may not cover all possible information. If you have questions about this medicine, talk to your doctor, pharmacist, or health care provider.  2023 Elsevier/Gold Standard (2004-07-12 00:00:00)

## 2022-09-06 LAB — ESTRADIOL: Estradiol: <15 pg/mL

## 2022-09-06 LAB — FOLLICLE STIMULATING HORMONE: FSH: 30 m[IU]/mL

## 2022-09-07 ENCOUNTER — Other Ambulatory Visit (HOSPITAL_COMMUNITY): Payer: Self-pay

## 2022-09-10 DIAGNOSIS — N200 Calculus of kidney: Secondary | ICD-10-CM | POA: Diagnosis not present

## 2022-09-14 ENCOUNTER — Ambulatory Visit: Payer: 59 | Admitting: Internal Medicine

## 2022-09-14 NOTE — Progress Notes (Deleted)
Patient ID: Erin [name], female   DOB: 20-Oct-1965, 57 y.o.   MRN: [id number]   HPI  Erin [name] is a 57 y.o.-year-old female, initially referred by her PCP, Dr. Quay Burow, presenting for follow-up for postablative hypothyroidism.  She previously saw Dr. Dwyane Dee.  She is not usually compliant with the recommended appointment intervals.  Last visit with me 1 year ago.  Interim history: No increased urination, blurry vision, nausea, chest pain. She does mention occasional dysphagia with drier foods.   Reviewed history: Pt. has been dx with hypothyroidism after RAI treatment for Graves' disease in 08/2008 >> on Unithroid d.a.w.. She had a lot of variability in the TFTs on generic LT4.  In the past she was not taking the medication correctly and we discussed about how to do so.  At last visit, TSH was elevated so I advised her to increase her LT4 dose.  However, she did not return for repeat labs afterwards and, upon questioning, at last visit, she was still on Unithroid 100 mcg daily...  We increased the dose to 112 mcg daily in 05/2020.   We increased the dose to 137 mcg daily in 03/2021.  We increased the dose to 150 mcg daily in 04/2021.  She is taking this: - in am - fasting - at least 30 min from b'fast - no calcium - no iron - no multivitamins - no PPIs, on H2 blocker  - not lately - stopped Biotin  Reviewed her TFTs: Lab Results  Component Value Date   TSH 3.27 10/05/2021   TSH 3.39 07/12/2021   TSH 5.67 (H) 05/24/2021   TSH 11.52 (H) 04/05/2021   TSH 5.98 (H) 06/22/2020   TSH 6.72 (H) 06/08/2020   TSH 8.72 (H) 10/24/2018   TSH 1.34 05/16/2018   TSH 0.43 03/13/2018   TSH 1.06 01/29/2018   FREET4 0.92 10/05/2021   FREET4 0.96 07/12/2021   FREET4 0.82 05/24/2021   FREET4 0.79 04/05/2021   FREET4 0.87 06/22/2020   FREET4 0.83 05/16/2018   FREET4 1.14 11/27/2017   FREET4 1.11 08/27/2017   FREET4 0.67 06/27/2017   FREET4 1.17 10/29/2016   T3FREE 6.7 (H) 09/04/2007    Antithyroid antibodies: No results found for: "THGAB" No components found for: "TPOAB"  Pt denies: - feeling nodules in neck - hoarseness - choking She has  dysphagia with dry foods.   She has + FH of thyroid disorders in: mother, M aunts. No FH of thyroid cancer. No h/o radiation tx to head or neck. No herbal supplements but she plans to start curcumin. No Biotin use. No recent steroids use.   She also has uncontrolled diabetes, with DKA in 12/2017. She previously wanted me to also start managing this, but she was lost to follow-up for me afterwards.  This is is currently managed by PCP. On Trulicity - tolerated well, with only slight nausea. Amaryl caused stomach discomfort.    Reviewed HbA1c levels: Lab Results  Component Value Date   HGBA1C 7.6 (H) 05/25/2022   HGBA1C 9.1 (H) 01/17/2022   HGBA1C 9.8 (H) 09/25/2021   HGBA1C 9.6 (H) 02/01/2021   HGBA1C 8.6 (H) 06/08/2020   HGBA1C 7.9 (H) 09/16/2019   HGBA1C 7.2 (H) 02/03/2019   HGBA1C 6.9 (H) 10/24/2018   HGBA1C 6.2 (A) 08/29/2018   HGBA1C 10.3 (H) 03/13/2018   No CKD: Lab Results  Component Value Date   BUN 20 07/13/2022   Lab Results  Component Value Date   CREATININE 0.86 07/13/2022  On  Cozaar 50.  + HL: Lab Results  Component Value Date   CHOL 121 05/25/2022   HDL 42.90 05/25/2022   LDLCALC 66 05/25/2022   TRIG 61.0 05/25/2022   CHOLHDL 3 05/25/2022  She was not on a statin - but started Crestor 10 mg daily after the above results returned.  Last eye exam: 01/2021: No DR-Dr. Sarajane Marek  She was on high doses on Prednisone (60 mg) in the past, then decreased.  Now off prednisone.  ROS: + see HPI  I reviewed pt's medications, allergies, PMH, social hx, family hx, and changes were documented in the history of present illness. Otherwise, unchanged from my initial visit note.  Past Medical History:  Diagnosis Date   Abnormal uterine bleeding    Anemia    COVID 12/2018   Depression     Diabetes mellitus without complication (HCC)    gestational   Elevated blood-pressure reading without diagnosis of hypertension    Endometriosis    Fibroids    GERD (gastroesophageal reflux disease)    Graves disease    Grief at loss of child    H/O gestational diabetes mellitus, not currently pregnant 09/28/2015   Hypertension    Hypothyroidism    Interstitial cystitis    Menorrhagia    Oropharyngeal candidiasis 01/16/2018   OSA (obstructive sleep apnea), severe 06/23/2018   Premature ventricular contractions    a. rare PVC by monitor 12/2016.   Right ovarian cyst    3 cm.   Thyroid disease    Uterine leiomyoma    Past Surgical History:  Procedure Laterality Date   APPENDECTOMY     ARTERY BIOPSY Left 10/25/2017   Procedure: BIOPSY TEMPORAL ARTERY;  Surgeon: Aviva Signs, MD;  Location: [facility];  Service: General;  Laterality: Left;   CESAREAN SECTION     CHOLECYSTECTOMY     HERNIA REPAIR     incisional   TUMOR REMOVAL  fibroids   Social History   Socioeconomic History   Marital status: Married    Spouse name: Not on file   Number of children: Not on file   Years of education: Not on file   Highest education level: Not on file  Occupational History   Not on file  Tobacco Use   Smoking status: Never   Smokeless tobacco: Never  Vaping Use   Vaping Use: Never used  Substance and Sexual Activity   Alcohol use: No   Drug use: No   Sexual activity: Never    Birth control/protection: I.U.D.    Comment: Mirena inserted 2015/2016  Other Topics Concern   Not on file  Social History Narrative   Caffeine: maybe 4 cups/day (tea/coffee)   Social Determinants of Health   Financial Resource Strain: Not on file  Food Insecurity: Not on file  Transportation Needs: Not on file  Physical Activity: Not on file  Stress: Not on file  Social Connections: Not on file  Intimate Partner Violence: Not on file   Current Outpatient Medications on File Prior to Visit  Medication Sig  Dispense Refill   ALPRAZolam (XANAX) 0.5 MG tablet Take 1 tablet (0.5 mg total) by mouth 2 (two) times daily as needed for anxiety. 60 tablet 0   Blood Glucose Monitoring Suppl (FREESTYLE LITE) w/Device KIT Use as directed. 1 kit 0   cholecalciferol (VITAMIN D) 1000 units tablet Take 1,000 Units by mouth daily.     clotrimazole (LOTRIMIN) 1 % cream Apply 1 application topically 2 (two) times daily. Use  for 2 weeks at a time as needed. 30 g 2   diltiazem (TIAZAC) 120 MG 24 hr capsule Take 1 capsule (120 mg total) by mouth daily. 90 capsule 3   diphenoxylate-atropine (LOMOTIL) 2.5-0.025 MG tablet Take 2 tablets by mouth 4 (four) times daily as needed for diarrhea or loose stools. 20 tablet 0   docusate sodium (COLACE) 100 MG capsule Take 1-3 capsules (100-300 mg total) by mouth daily as needed for mild constipation. 90 capsule 5   Dulaglutide (TRULICITY) 4.5 0000000 SOPN Inject 4.5 mg as directed once a week. 6 mL 1   famotidine (PEPCID) 20 MG tablet Take 1 tablet (20 mg total) by mouth 2 (two) times daily. 180 tablet 3   glucose blood (FREESTYLE LITE) test strip Use as instructed 100 each 12   Lancets (FREESTYLE) lancets Use as instructed 100 each 12   levonorgestrel (MIRENA) 20 MCG/24HR IUD 1 each by Intrauterine route once.      losartan (COZAAR) 100 MG tablet Take 1 tablet (100 mg total) by mouth daily. 90 tablet 3   meloxicam (MOBIC) 15 MG tablet Take 1 tablet (15 mg total) by mouth daily. 30 tablet 3   methocarbamol (ROBAXIN) 750 MG tablet Take 1 tablet (750 mg total) by mouth 4 (four) times daily. 120 tablet 2   Multiple Vitamin (MULTIVITAMIN) tablet Take 1 tablet by mouth daily.     rosuvastatin (CRESTOR) 10 MG tablet Take 1 tablet (10 mg total) by mouth daily. 90 tablet 3   UNITHROID 150 MCG tablet Take 1 tablet (150 mcg total) by mouth daily. 90 tablet 3   No current facility-administered medications on file prior to visit.   Allergies  Allergen Reactions   Prednisone Other (See  Comments)    Raises blood sugar so high that she receives intensive care   Fluoxetine    Glimepiride     Stomach upset   Prozac [Fluoxetine Hcl] Nausea Only   Hydrocodone Hives, Itching and Rash    On thighs    Metformin And Related     diarrhea   Family History  Problem Relation Age of Onset   Hypertension Mother    Cancer Mother    Breast cancer Mother 59   Hypertension Father    Diabetes Father    Cancer Father    Ovarian cancer Maternal Aunt    Thyroid disease Maternal Aunt        hypothyroid   PE: There were no vitals taken for this visit. Wt Readings from Last 3 Encounters:  09/05/22 (!) 350 lb (158.8 kg)  08/06/22 (!) 349 lb (158.3 kg)  07/13/22 (!) 340 lb (154.2 kg)   Constitutional: overweight, in NAD Eyes:  EOMI, no exophthalmos ENT: no neck masses, no cervical lymphadenopathy Cardiovascular: RRR, No MRG Respiratory: CTA B Musculoskeletal: no deformities Skin:no rashes Neurological: no tremor with outstretched hands  ASSESSMENT: 1. Hypothyroidism - After RAI treatment for Graves' disease  2. 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 Patient with longstanding, uncontrolled, hypothyroidism, on levothyroxine therapy.   - latest thyroid labs reviewed with pt. >> normal: Lab Results  Component Value Date   TSH 3.27 10/05/2021  - she continues on LT4 150 mcg daily (Unithroid d.a.w.) - pt feels good on this dose. - we discussed about taking the thyroid hormone every day, with water, >30 minutes before breakfast, separated by >4 hours from acid reflux medications, calcium, iron, multivitamins. Pt. is taking it correctly. - will check thyroid tests today: TSH and  fT4 - If labs are abnormal, she will need to return for repeat TFTs in 1.5 months - OTW, I will see her back in a year  2 DM 2/2 steroid use -In the past, she wanted me to manage this, but she was not compliant with appointments so she continues to be managed by PCP -Previously on Rybelsus added by PCP, but  she could not tolerate it due to increased sweating and blurry vision.  It was also expensive.  She also tried Trulicity which was expensive.  Amaryl caused stomachache.  Before last visit, she started Trulicity, currently tolerated reasonably, with only slight nausea -HbA1c before last visit was higher, at 9.8% but the latest value was 7.6% from 3 months ago  Needs refills for 90 days.  Philemon Kingdom, MD PhD [facility] Endocr[facility]

## 2022-09-21 ENCOUNTER — Encounter: Payer: Self-pay | Admitting: Internal Medicine

## 2022-09-21 ENCOUNTER — Ambulatory Visit: Payer: Self-pay | Admitting: Internal Medicine

## 2022-09-21 ENCOUNTER — Other Ambulatory Visit (HOSPITAL_COMMUNITY): Payer: Self-pay

## 2022-09-27 DIAGNOSIS — G4731 Primary central sleep apnea: Secondary | ICD-10-CM | POA: Diagnosis not present

## 2022-09-27 DIAGNOSIS — G4733 Obstructive sleep apnea (adult) (pediatric): Secondary | ICD-10-CM | POA: Diagnosis not present

## 2022-10-08 NOTE — Progress Notes (Deleted)
57 y.o. G72P0 Married [place] American female here for annual exam.    PCP:     No LMP recorded. (Menstrual status: IUD).           Sexually active: {yes no:314532}  The current method of family planning is IUD--2015/2016.    Exercising: {yes no:314532}  {types:19826} Smoker:  no  Health Maintenance: Pap:  05/02/18 neg: HR HPV neg, 09/04/07 neg  History of abnormal Pap:  no MMG:  11/16/20 Breast Density category A, BI-RADS CATEGORY 1 neg  Colonoscopy:  n/a BMD:   n/a  Result  n/a TDaP:  2006 Gardasil:   no HIV: n/a Hep C: n/a Screening Labs:  Hb today: ***, Urine today: ***   reports that she has never smoked. She has never used smokeless tobacco. She reports that she does not drink alcohol and does not use drugs.  Past Medical History:  Diagnosis Date   Abnormal uterine bleeding    Anemia    COVID 12/2018   Depression    Diabetes mellitus without complication (HCC)    gestational   Elevated blood-pressure reading without diagnosis of hypertension    Endometriosis    Fibroids    GERD (gastroesophageal reflux disease)    Graves disease    Grief at loss of child    H/O gestational diabetes mellitus, not currently pregnant 09/28/2015   Hypertension    Hypothyroidism    Interstitial cystitis    Menorrhagia    Oropharyngeal candidiasis 01/16/2018   OSA (obstructive sleep apnea), severe 06/23/2018   Premature ventricular contractions    a. rare PVC by monitor 12/2016.   Right ovarian cyst    3 cm.   Thyroid disease    Uterine leiomyoma     Past Surgical History:  Procedure Laterality Date   APPENDECTOMY     ARTERY BIOPSY Left 10/25/2017   Procedure: BIOPSY TEMPORAL ARTERY;  Surgeon: Franky Macho, MD;  Location: [facility];  Service: General;  Laterality: Left;   CESAREAN SECTION     CHOLECYSTECTOMY     HERNIA REPAIR     incisional   TUMOR REMOVAL  fibroids    Current Outpatient Medications  Medication Sig Dispense Refill   ALPRAZolam (XANAX) 0.5 MG tablet Take 1  tablet (0.5 mg total) by mouth 2 (two) times daily as needed for anxiety. 60 tablet 0   Blood Glucose Monitoring Suppl (FREESTYLE LITE) w/Device KIT Use as directed. 1 kit 0   cholecalciferol (VITAMIN D) 1000 units tablet Take 1,000 Units by mouth daily.     clotrimazole (LOTRIMIN) 1 % cream Apply 1 application topically 2 (two) times daily. Use for 2 weeks at a time as needed. 30 g 2   diltiazem (TIAZAC) 120 MG 24 hr capsule Take 1 capsule (120 mg total) by mouth daily. 90 capsule 3   diphenoxylate-atropine (LOMOTIL) 2.5-0.025 MG tablet Take 2 tablets by mouth 4 (four) times daily as needed for diarrhea or loose stools. 20 tablet 0   docusate sodium (COLACE) 100 MG capsule Take 1-3 capsules (100-300 mg total) by mouth daily as needed for mild constipation. 90 capsule 5   Dulaglutide (TRULICITY) 4.5 MG/0.5ML SOPN Inject 4.5 mg as directed once a week. 6 mL 1   famotidine (PEPCID) 20 MG tablet Take 1 tablet (20 mg total) by mouth 2 (two) times daily. 180 tablet 3   glucose blood (FREESTYLE LITE) test strip Use as instructed 100 each 12   Lancets (FREESTYLE) lancets Use as instructed 100 each 12  levonorgestrel (MIRENA) 20 MCG/24HR IUD 1 each by Intrauterine route once.      losartan (COZAAR) 100 MG tablet Take 1 tablet (100 mg total) by mouth daily. 90 tablet 3   meloxicam (MOBIC) 15 MG tablet Take 1 tablet (15 mg total) by mouth daily. 30 tablet 3   methocarbamol (ROBAXIN) 750 MG tablet Take 1 tablet (750 mg total) by mouth 4 (four) times daily. 120 tablet 2   Multiple Vitamin (MULTIVITAMIN) tablet Take 1 tablet by mouth daily.     rosuvastatin (CRESTOR) 10 MG tablet Take 1 tablet (10 mg total) by mouth daily. 90 tablet 3   UNITHROID 150 MCG tablet Take 1 tablet (150 mcg total) by mouth daily. 90 tablet 3   No current facility-administered medications for this visit.    Family History  Problem Relation Age of Onset   Hypertension Mother    Cancer Mother    Breast cancer Mother 39    Hypertension Father    Diabetes Father    Cancer Father    Ovarian cancer Maternal Aunt    Thyroid disease Maternal Aunt        hypothyroid    Review of Systems  Exam:   There were no vitals taken for this visit.    General appearance: alert, cooperative and appears stated age Head: normocephalic, without obvious abnormality, atraumatic Neck: no adenopathy, supple, symmetrical, trachea midline and thyroid normal to inspection and palpation Lungs: clear to auscultation bilaterally Breasts: normal appearance, no masses or tenderness, No nipple retraction or dimpling, No nipple discharge or bleeding, No axillary adenopathy Heart: regular rate and rhythm Abdomen: soft, non-tender; no masses, no organomegaly Extremities: extremities normal, atraumatic, no cyanosis or edema Skin: skin color, texture, turgor normal. No rashes or lesions Lymph nodes: cervical, supraclavicular, and axillary nodes normal. Neurologic: grossly normal  Pelvic: External genitalia:  no lesions              No abnormal inguinal nodes palpated.              Urethra:  normal appearing urethra with no masses, tenderness or lesions              Bartholins and Skenes: normal                 Vagina: normal appearing vagina with normal color and discharge, no lesions              Cervix: no lesions              Pap taken: {yes no:314532} Bimanual Exam:  Uterus:  normal size, contour, position, consistency, mobility, non-tender              Adnexa: no mass, fullness, tenderness              Rectal exam: {yes no:314532}.  Confirms.              Anus:  normal sphincter tone, no lesions  Chaperone was present for exam:  ***  Assessment:   Well woman visit with gynecologic exam.   Plan: Mammogram screening discussed. Self breast awareness reviewed. Pap and HR HPV as above. Guidelines for Calcium, Vitamin D, regular exercise program including cardiovascular and weight bearing exercise.   Follow up annually and  prn.   Additional counseling given.  {yes [id number]. _______ minutes face to face time of which over 50% was spent in counseling.    After visit summary provided.

## 2022-10-18 ENCOUNTER — Other Ambulatory Visit: Payer: Self-pay | Admitting: Internal Medicine

## 2022-10-18 ENCOUNTER — Other Ambulatory Visit: Payer: Self-pay

## 2022-10-18 ENCOUNTER — Other Ambulatory Visit (HOSPITAL_COMMUNITY): Payer: Self-pay

## 2022-10-18 MED ORDER — TRULICITY 4.5 MG/0.5ML ~~LOC~~ SOAJ
4.5000 mg | SUBCUTANEOUS | 1 refills | Status: DC
  Filled 2022-10-18: qty 6, 84d supply, fill #0

## 2022-10-22 ENCOUNTER — Ambulatory Visit: Payer: 59 | Admitting: Obstetrics and Gynecology

## 2022-10-28 DIAGNOSIS — G4731 Primary central sleep apnea: Secondary | ICD-10-CM | POA: Diagnosis not present

## 2022-10-28 DIAGNOSIS — G4733 Obstructive sleep apnea (adult) (pediatric): Secondary | ICD-10-CM | POA: Diagnosis not present

## 2022-11-13 ENCOUNTER — Other Ambulatory Visit: Payer: Self-pay | Admitting: Internal Medicine

## 2022-11-14 ENCOUNTER — Other Ambulatory Visit: Payer: Self-pay

## 2022-11-14 ENCOUNTER — Other Ambulatory Visit (HOSPITAL_COMMUNITY): Payer: Self-pay

## 2022-11-14 MED ORDER — MELOXICAM 15 MG PO TABS
15.0000 mg | ORAL_TABLET | Freq: Every day | ORAL | 0 refills | Status: DC
  Filled 2022-11-14: qty 30, 30d supply, fill #0

## 2022-11-21 ENCOUNTER — Other Ambulatory Visit: Payer: Self-pay | Admitting: Internal Medicine

## 2022-11-23 ENCOUNTER — Telehealth: Payer: Self-pay | Admitting: Internal Medicine

## 2022-11-23 MED ORDER — UNITHROID 150 MCG PO TABS: 150.0000 ug | ORAL_TABLET | Freq: Every day | ORAL | 3 refills | Status: DC

## 2022-11-23 NOTE — Telephone Encounter (Signed)
MEDICATION: Unithroid UNITHROID 150 MCG tablet  PHARMACY:  [id number] - [place], [place] - [address] AT [facility] OF [address] (Ph: [phone])   HAS THE PATIENT CONTACTED THEIR PHARMACY?  Yes  IS THIS A 90 DAY SUPPLY : Yes  IS PATIENT OUT OF MEDICATION: Will be in 2 days  IF NOT; HOW MUCH IS LEFT: 2 days  LAST APPOINTMENT DATE: @4 /13/2023  NEXT APPOINTMENT DATE:@9 /20/2024  DO WE HAVE YOUR PERMISSION TO LEAVE A DETAILED MESSAGE?: Yes  OTHER COMMENTS:    **Let patient know to contact pharmacy at the end of the day to make sure medication is ready. **  ** Please notify patient to allow 48-72 hours to process**  **Encourage patient to contact the pharmacy for refills or they can request refills through [facility]**

## 2022-11-23 NOTE — Telephone Encounter (Signed)
Requested Prescriptions   Signed Prescriptions Disp Refills   UNITHROID 150 MCG tablet 30 tablet 3    Sig: Take 1 tablet (150 mcg total) by mouth daily.    Authorizing Provider: Carlus Pavlov    Ordering User: Pollie Meyer

## 2022-11-28 DIAGNOSIS — G4731 Primary central sleep apnea: Secondary | ICD-10-CM | POA: Diagnosis not present

## 2022-11-28 DIAGNOSIS — G4733 Obstructive sleep apnea (adult) (pediatric): Secondary | ICD-10-CM | POA: Diagnosis not present

## 2022-12-14 ENCOUNTER — Telehealth (INDEPENDENT_AMBULATORY_CARE_PROVIDER_SITE_OTHER): Payer: 59 | Admitting: Nurse Practitioner

## 2022-12-14 DIAGNOSIS — H109 Unspecified conjunctivitis: Secondary | ICD-10-CM | POA: Diagnosis not present

## 2022-12-14 MED ORDER — POLYMYXIN B-TRIMETHOPRIM 10000-0.1 UNIT/ML-% OP SOLN: OPHTHALMIC | 0 refills | Status: DC

## 2022-12-14 NOTE — Patient Instructions (Signed)
Call us if symptoms do not improve by next week, if symptoms worsen over the weekend seek emergent evaluation with ophthalmologist.

## 2022-12-14 NOTE — Progress Notes (Signed)
   Established Patient Office Visit  An audio/visual tele-health visit was completed today for this patient. I connected with  Erin [name] on 12/14/22 utilizing audio/visual technology and verified that I am speaking with the correct person using two identifiers. The patient was located at their home, and I was located at the office of [facility] during the encounter. I discussed the limitations of evaluation and management by telemedicine. The patient expressed understanding and agreed to proceed.     Subjective   Patient ID: Erin [name], female    DOB: 04/07/1966  Age: 57 y.o. MRN: [id number]  Chief Complaint  Patient presents with   Conjunctivitis    Symptom onset 2 days ago.  Left eye is having purulent discharge, feels itchy, intermittent blurriness, seems to be watering more frequently, and is mildly uncomfortable.  Patient feels that this could be conjunctivitis no known exposure.  Reports that blurry vision improves when she wipes or cleans her eye, discomfort is very mild. Patient does not wear contacts.     ROS    Objective:     There were no vitals taken for this visit.   Physical Exam Comprehensive physical exam not completed today as office visit was conducted remotely.  Left eyelid does appear slightly more swollen than right, no significant scleral icterus noted.  Patient was alert and oriented, and appeared to have appropriate judgment.   No results found for any visits on 12/14/22.    The ASCVD Risk score (Arnett DK, et al., 2019) failed to calculate for the following reasons:   The valid total cholesterol range is 130 to 320 mg/dL    Assessment & Plan:   Problem List Items Addressed This Visit       Other   Bacterial conjunctivitis - Primary    Acute Treat with trimethoprim polymyxin eyedrops. 1 drop to left eye every 3-4 hours for 10 days to not exceed 6 drops in a 24 hour period.  Encouraged use of warm compress  throughout the day and to practice proper hand hygiene frequently.  Call us if symptoms do not improve by next week, if symptoms worsen over the weekend seek emergent evaluation with ophthalmologist.  Patient reports understanding.       Relevant Medications   trimethoprim-polymyxin b (POLYTRIM) ophthalmic solution    No follow-ups on file.    Elenore Paddy, NP

## 2022-12-14 NOTE — Assessment & Plan Note (Signed)
Acute Treat with trimethoprim polymyxin eyedrops. 1 drop to left eye every 3-4 hours for 10 days to not exceed 6 drops in a 24 hour period.  Encouraged use of warm compress throughout the day and to practice proper hand hygiene frequently.  Call us if symptoms do not improve by next week, if symptoms worsen over the weekend seek emergent evaluation with ophthalmologist.  Patient reports understanding.

## 2022-12-25 ENCOUNTER — Other Ambulatory Visit: Payer: Self-pay | Admitting: Internal Medicine

## 2022-12-25 ENCOUNTER — Telehealth: Payer: Self-pay

## 2022-12-25 ENCOUNTER — Other Ambulatory Visit (HOSPITAL_COMMUNITY): Payer: Self-pay

## 2022-12-25 ENCOUNTER — Encounter: Payer: Self-pay | Admitting: Internal Medicine

## 2022-12-25 DIAGNOSIS — E890 Postprocedural hypothyroidism: Secondary | ICD-10-CM

## 2022-12-25 NOTE — Patient Instructions (Addendum)
      Blood work was ordered.   The lab is on the first floor.    Medications changes include :  none       Return in about 6 months (around 06/28/2023) for Physical Exam.  

## 2022-12-25 NOTE — Progress Notes (Unsigned)
Subjective:    Patient ID: Erin [name], female    DOB: [birth year], 57 y.o.   MRN: [id number]     HPI Jiya is here for follow up of her chronic medical problems.  Trulicity is causing diarrhea - she has been taking it twice a month instead of every week recently and the diarrhea did not improve.  For a while she was tolerating medication okay.  She is not 100% sure that the medication is what is causing the diarrhea.  Has been a lot of stress which could be contributing.  She last took the medication 2 days ago and she had a little diarrhea the first day but has been fine since then.  Sugars have been in the 200s.     Feels like there is something on the back of her tongue.  Started 3 weeks ago-has not better.  She had DOE which is chronic and not new.  She has a new feeling of needing to take a deep breath- she has to take several deep breaths to feel ok.  This occurs at rest, when laying or with activity.  Stress level is horrible.  She is crying all the time.  She lost her job.  Her best friend no longer wants to be friends with her.  She is still grieves her daughter that died several years ago.  There are financial stresses.  Medications and allergies reviewed with patient and updated if appropriate.  Current Outpatient Medications on File Prior to Visit  Medication Sig Dispense Refill   ALPRAZolam (XANAX) 0.5 MG tablet Take 1 tablet (0.5 mg total) by mouth 2 (two) times daily as needed for anxiety. 60 tablet 0   Blood Glucose Monitoring Suppl (FREESTYLE LITE) w/Device KIT Use as directed. 1 kit 0   cholecalciferol (VITAMIN D) 1000 units tablet Take 1,000 Units by mouth daily.     clotrimazole (LOTRIMIN) 1 % cream Apply 1 application topically 2 (two) times daily. Use for 2 weeks at a time as needed. 30 g 2   diltiazem (TIAZAC) 120 MG 24 hr capsule Take 1 capsule (120 mg total) by mouth daily. 90 capsule 3   diphenoxylate-atropine (LOMOTIL) 2.5-0.025 MG tablet Take 2  tablets by mouth 4 (four) times daily as needed for diarrhea or loose stools. 20 tablet 0   docusate sodium (COLACE) 100 MG capsule Take 1-3 capsules (100-300 mg total) by mouth daily as needed for mild constipation. 90 capsule 5   Dulaglutide (TRULICITY) 4.5 MG/0.5ML SOPN Inject 4.5 mg as directed once a week. 6 mL 1   famotidine (PEPCID) 20 MG tablet Take 1 tablet (20 mg total) by mouth 2 (two) times daily. 180 tablet 3   glucose blood (FREESTYLE LITE) test strip Use as instructed 100 each 12   Lancets (FREESTYLE) lancets Use as instructed 100 each 12   levonorgestrel (MIRENA) 20 MCG/24HR IUD 1 each by Intrauterine route once.      levothyroxine (SYNTHROID) 150 MCG tablet Take 1 tablet (150 mcg total) by mouth daily. 90 tablet 3   losartan (COZAAR) 100 MG tablet Take 1 tablet (100 mg total) by mouth daily. 90 tablet 3   meloxicam (MOBIC) 15 MG tablet Take 1 tablet (15 mg total) by mouth daily. 30 tablet 0   methocarbamol (ROBAXIN) 750 MG tablet Take 1 tablet (750 mg total) by mouth 4 (four) times daily. 120 tablet 2   Multiple Vitamin (MULTIVITAMIN) tablet Take 1 tablet by mouth daily.  rosuvastatin (CRESTOR) 10 MG tablet Take 1 tablet (10 mg total) by mouth daily. 90 tablet 3   trimethoprim-polymyxin b (POLYTRIM) ophthalmic solution Instill 1 drop into the affected eye every 3-4 hours for 7-10 days, DO NOT EXCEED 6 drops in 24 hours. 10 mL 0   UNITHROID 150 MCG tablet Take 1 tablet (150 mcg total) by mouth daily. 30 tablet 3   No current facility-administered medications on file prior to visit.     Review of Systems  Constitutional:  Positive for diaphoresis (night sweats). Negative for fever.  Respiratory:  Positive for shortness of breath (chronic). Negative for cough and wheezing.   Cardiovascular:  Positive for palpitations and leg swelling (LLE). Negative for chest pain.  Musculoskeletal:  Positive for arthralgias and back pain.  Neurological:  Positive for headaches.        Objective:   Vitals:   12/26/22 1449  BP: 132/82  Pulse: 80  Temp: 98.3 F (36.8 C)  SpO2: 98%   BP Readings from Last 3 Encounters:  12/26/22 132/82  09/05/22 118/68  08/06/22 110/74   Wt Readings from Last 3 Encounters:  12/26/22 (!) 352 lb (159.7 kg)  09/05/22 (!) 350 lb (158.8 kg)  08/06/22 (!) 349 lb (158.3 kg)   Body mass index is 58.58 kg/m.    Physical Exam Constitutional:      General: She is not in acute distress.    Appearance: Normal appearance.  HENT:     Head: Normocephalic and atraumatic.  Eyes:     Conjunctiva/sclera: Conjunctivae normal.  Cardiovascular:     Rate and Rhythm: Normal rate and regular rhythm.     Heart sounds: Normal heart sounds.  Pulmonary:     Effort: Pulmonary effort is normal. No respiratory distress.     Breath sounds: Normal breath sounds. No wheezing.  Musculoskeletal:     Cervical back: Neck supple.     Right lower leg: No edema.     Left lower leg: No edema.  Lymphadenopathy:     Cervical: No cervical adenopathy.  Skin:    General: Skin is warm and dry.     Findings: No rash.  Neurological:     Mental Status: She is alert. Mental status is at baseline.  Psychiatric:        Mood and Affect: Mood normal.        Behavior: Behavior normal.        Lab Results  Component Value Date   WBC 11.8 (H) 07/13/2022   HGB 11.5 (L) 07/13/2022   HCT 35.8 (L) 07/13/2022   PLT 301 07/13/2022   GLUCOSE 158 (H) 07/13/2022   CHOL 121 05/25/2022   TRIG 61.0 05/25/2022   HDL 42.90 05/25/2022   LDLCALC 66 05/25/2022   ALT 32 07/13/2022   AST 27 07/13/2022   NA 141 07/13/2022   K 3.6 07/13/2022   CL 106 07/13/2022   CREATININE 0.86 07/13/2022   BUN 20 07/13/2022   CO2 24 07/13/2022   TSH 3.27 10/05/2021   HGBA1C 7.6 (H) 05/25/2022   MICROALBUR 0.8 09/25/2021     Assessment & Plan:    See Problem List for Assessment and Plan of chronic medical problems.

## 2022-12-25 NOTE — Telephone Encounter (Signed)
[facility] Clinical [facility] (supporting Carlus Pavlov, MD)1 hour ago (2:53 PM)    Afternoon Dr. Elvera Lennox, I went to the pharmacy to pick up my prescription and I was told that they needed a prior authorization code for Unithroid name brand could you please help me with this matter? Thanks

## 2022-12-26 ENCOUNTER — Ambulatory Visit: Payer: 59 | Admitting: Internal Medicine

## 2022-12-26 ENCOUNTER — Other Ambulatory Visit (HOSPITAL_COMMUNITY): Payer: Self-pay

## 2022-12-26 VITALS — BP 132/82 | HR 80 | Temp 98.3°F | Ht 65.0 in | Wt 352.0 lb

## 2022-12-26 DIAGNOSIS — I10 Essential (primary) hypertension: Secondary | ICD-10-CM

## 2022-12-26 DIAGNOSIS — E890 Postprocedural hypothyroidism: Secondary | ICD-10-CM | POA: Diagnosis not present

## 2022-12-26 DIAGNOSIS — K2100 Gastro-esophageal reflux disease with esophagitis, without bleeding: Secondary | ICD-10-CM

## 2022-12-26 DIAGNOSIS — K219 Gastro-esophageal reflux disease without esophagitis: Secondary | ICD-10-CM

## 2022-12-26 DIAGNOSIS — M5416 Radiculopathy, lumbar region: Secondary | ICD-10-CM

## 2022-12-26 DIAGNOSIS — E1165 Type 2 diabetes mellitus with hyperglycemia: Secondary | ICD-10-CM | POA: Diagnosis not present

## 2022-12-26 DIAGNOSIS — Z7985 Long-term (current) use of injectable non-insulin antidiabetic drugs: Secondary | ICD-10-CM

## 2022-12-26 DIAGNOSIS — E782 Mixed hyperlipidemia: Secondary | ICD-10-CM

## 2022-12-26 DIAGNOSIS — F419 Anxiety disorder, unspecified: Secondary | ICD-10-CM

## 2022-12-26 DIAGNOSIS — Z7984 Long term (current) use of oral hypoglycemic drugs: Secondary | ICD-10-CM | POA: Diagnosis not present

## 2022-12-26 LAB — CBC WITH DIFFERENTIAL/PLATELET
Basophils Absolute: 0.1 10*3/uL (ref 0.0–0.1)
Basophils Relative: 0.7 % (ref 0.0–3.0)
Eosinophils Absolute: 0.1 10*3/uL (ref 0.0–0.7)
Eosinophils Relative: 0.9 % (ref 0.0–5.0)
HCT: 39.3 % (ref 36.0–46.0)
Hemoglobin: 12.7 g/dL (ref 12.0–15.0)
Lymphocytes Relative: 38.6 % (ref 12.0–46.0)
Lymphs Abs: 4.6 10*3/uL — ABNORMAL HIGH (ref 0.7–4.0)
MCHC: 32.5 g/dL (ref 30.0–36.0)
MCV: 83.4 fl (ref 78.0–100.0)
Monocytes Absolute: 0.6 10*3/uL (ref 0.1–1.0)
Monocytes Relative: 5.3 % (ref 3.0–12.0)
Neutro Abs: 6.6 10*3/uL (ref 1.4–7.7)
Neutrophils Relative %: 54.5 % (ref 43.0–77.0)
Platelets: 305 10*3/uL (ref 150.0–400.0)
RBC: 4.71 Mil/uL (ref 3.87–5.11)
RDW: 14.8 % (ref 11.5–15.5)
WBC: 12 10*3/uL — ABNORMAL HIGH (ref 4.0–10.5)

## 2022-12-26 LAB — LIPID PANEL
Cholesterol: 160 mg/dL (ref 0–200)
HDL: 42.3 mg/dL (ref >39.00)
LDL Cholesterol: 93 mg/dL (ref 0–99)
NonHDL: 117.75
Total CHOL/HDL Ratio: 4
Triglycerides: 126 mg/dL (ref 0.0–149.0)
VLDL: 25.2 mg/dL (ref 0.0–40.0)

## 2022-12-26 LAB — COMPREHENSIVE METABOLIC PANEL
ALT: 29 U/L (ref 0–35)
AST: 22 U/L (ref 0–37)
Albumin: 4.3 g/dL (ref 3.5–5.2)
Alkaline Phosphatase: 190 U/L — ABNORMAL HIGH (ref 39–117)
BUN: 15 mg/dL (ref 6–23)
CO2: 28 mEq/L (ref 19–32)
Calcium: 10 mg/dL (ref 8.4–10.5)
Chloride: 101 mEq/L (ref 96–112)
Creatinine, Ser: 0.81 mg/dL (ref 0.40–1.20)
GFR: 81.14 mL/min (ref >60.00)
Glucose, Bld: 167 mg/dL — ABNORMAL HIGH (ref 70–99)
Potassium: 3.9 mEq/L (ref 3.5–5.1)
Sodium: 138 mEq/L (ref 135–145)
Total Bilirubin: 0.8 mg/dL (ref 0.2–1.2)
Total Protein: 8.1 g/dL (ref 6.0–8.3)

## 2022-12-26 LAB — HEMOGLOBIN A1C: Hgb A1c MFr Bld: 9.6 % — ABNORMAL HIGH (ref 4.6–6.5)

## 2022-12-26 LAB — TSH: TSH: 0.36 u[IU]/mL (ref 0.35–5.50)

## 2022-12-26 LAB — MICROALBUMIN / CREATININE URINE RATIO
Creatinine,U: 245.8 mg/dL
Microalb Creat Ratio: 1 mg/g (ref 0.0–30.0)
Microalb, Ur: 2.5 mg/dL — ABNORMAL HIGH (ref 0.0–1.9)

## 2022-12-26 MED ORDER — LEVOTHYROXINE SODIUM 150 MCG PO TABS: 150.0000 ug | ORAL_TABLET | Freq: Every day | ORAL | 3 refills | Status: DC

## 2022-12-26 MED ORDER — MELOXICAM 15 MG PO TABS
15.0000 mg | ORAL_TABLET | Freq: Every day | ORAL | 3 refills | Status: DC
  Filled 2022-12-26: qty 30, 30d supply, fill #0
  Filled 2023-01-18 – 2023-01-25 (×2): qty 30, 30d supply, fill #1
  Filled 2023-02-22: qty 30, 30d supply, fill #2
  Filled 2023-03-25: qty 30, 30d supply, fill #3

## 2022-12-26 MED ORDER — EMPAGLIFLOZIN 10 MG PO TABS
10.0000 mg | ORAL_TABLET | Freq: Every day | ORAL | 5 refills | Status: DC
  Filled 2022-12-26: qty 30, 30d supply, fill #0
  Filled 2023-01-18 – 2023-01-25 (×2): qty 30, 30d supply, fill #1
  Filled 2023-02-20: qty 30, 30d supply, fill #2
  Filled 2023-03-25: qty 30, 30d supply, fill #3
  Filled 2023-04-22: qty 30, 30d supply, fill #4
  Filled 2023-05-20: qty 30, 30d supply, fill #5

## 2022-12-26 MED ORDER — FAMOTIDINE 20 MG PO TABS
20.0000 mg | ORAL_TABLET | Freq: Two times a day (BID) | ORAL | 3 refills | Status: DC
  Filled 2022-12-26: qty 180, 90d supply, fill #0
  Filled 2023-04-22: qty 180, 90d supply, fill #1

## 2022-12-26 NOTE — Assessment & Plan Note (Addendum)
Chronic   Lab Results  Component Value Date   HGBA1C 7.6 (H) 05/25/2022   Sugars not ideally controlled by A1c when it was last checked Testing sugars 1 times a day-in 200s Check A1c Continue Trulicity 4.5 mg-go back to weekly-I do not think this is the cause of her diarrhea Start Jardiance 10 mg daily-believe this is covered Stressed regular exercise, diabetic diet Continue working on weight loss

## 2022-12-26 NOTE — Assessment & Plan Note (Signed)
Chronic Blood pressure not well controlled CMP, CBC Continue diltiazem 120 mg daily, losartan to 100 mg daily

## 2022-12-26 NOTE — Assessment & Plan Note (Addendum)
Chronic Increased stress Continue alprazolam 0.5 mg twice daily as needed-she does not take often. Discussed possibly starting a daily SSRI-she will discuss with GYN because they had 1 in mind it may also help with her menopausal symptoms Discussed seeing a therapist

## 2022-12-26 NOTE — Assessment & Plan Note (Addendum)
Chronic Intermittent symptoms Continue meloxicam 15 mg daily , methocarbamol 750 mg every 6 hours as needed - taking daily Encouraged weight loss

## 2022-12-26 NOTE — Assessment & Plan Note (Addendum)
Chronic Discussed the importance of weight loss She is limited on how much exercise she can do because of knee arthritis-encouraged her to do as much she can Decrease sugars and carbs Continue Trulicity 4.5 mg-go back to weekly-I do not think this is what has caused her diarrhea

## 2022-12-26 NOTE — Assessment & Plan Note (Signed)
Chronic ?GERD controlled ?Continue famotidine 20 mg twice daily ?

## 2022-12-26 NOTE — Assessment & Plan Note (Signed)
Chronic Management per endocrine-Dr. Gherghe On unithyroid 150 mcg daily 

## 2022-12-26 NOTE — Assessment & Plan Note (Signed)
Chronic Regular exercise and healthy diet encouraged Check lipid panel  Continue Crestor 10 mg daily 

## 2022-12-28 DIAGNOSIS — G4733 Obstructive sleep apnea (adult) (pediatric): Secondary | ICD-10-CM | POA: Diagnosis not present

## 2022-12-28 DIAGNOSIS — G4731 Primary central sleep apnea: Secondary | ICD-10-CM | POA: Diagnosis not present

## 2022-12-29 ENCOUNTER — Encounter: Payer: Self-pay | Admitting: Internal Medicine

## 2023-01-01 ENCOUNTER — Other Ambulatory Visit (HOSPITAL_COMMUNITY): Payer: Self-pay

## 2023-01-18 ENCOUNTER — Other Ambulatory Visit (HOSPITAL_COMMUNITY): Payer: Self-pay

## 2023-01-25 ENCOUNTER — Other Ambulatory Visit (HOSPITAL_COMMUNITY): Payer: Self-pay

## 2023-01-28 DIAGNOSIS — G4733 Obstructive sleep apnea (adult) (pediatric): Secondary | ICD-10-CM | POA: Diagnosis not present

## 2023-01-28 DIAGNOSIS — G4731 Primary central sleep apnea: Secondary | ICD-10-CM | POA: Diagnosis not present

## 2023-02-12 ENCOUNTER — Encounter: Payer: Self-pay | Admitting: Internal Medicine

## 2023-02-20 ENCOUNTER — Other Ambulatory Visit (HOSPITAL_COMMUNITY): Payer: Self-pay

## 2023-02-20 ENCOUNTER — Other Ambulatory Visit: Payer: Self-pay | Admitting: Internal Medicine

## 2023-02-20 MED ORDER — DILTIAZEM HCL ER BEADS 120 MG PO CP24
120.0000 mg | ORAL_CAPSULE | Freq: Every day | ORAL | 3 refills | Status: DC
  Filled 2023-02-20: qty 90, 90d supply, fill #0
  Filled 2023-04-22 – 2023-05-20 (×2): qty 90, 90d supply, fill #1
  Filled 2023-08-16: qty 90, 90d supply, fill #2
  Filled 2023-11-15 (×2): qty 90, 90d supply, fill #3

## 2023-02-25 ENCOUNTER — Telehealth: Payer: Self-pay | Admitting: Obstetrics and Gynecology

## 2023-02-25 DIAGNOSIS — N898 Other specified noninflammatory disorders of vagina: Secondary | ICD-10-CM

## 2023-02-25 DIAGNOSIS — R300 Dysuria: Secondary | ICD-10-CM

## 2023-02-25 MED ORDER — FLUCONAZOLE 150 MG PO TABS: 150.0000 mg | ORAL_TABLET | Freq: Once | ORAL | 0 refills | Status: AC

## 2023-02-25 MED ORDER — SULFAMETHOXAZOLE-TRIMETHOPRIM 800-160 MG PO TABS: 1.0000 | ORAL_TABLET | Freq: Two times a day (BID) | ORAL | 0 refills | Status: AC

## 2023-02-25 NOTE — Telephone Encounter (Signed)
Pt called complaining of burning with urination as well as increased urgency and frequency. Also experiencing some vaginal itching.   Will treat possible UTI with bactrim DS BID x 3 days. Then will also prescribe fluconazole for possible yeast infection.   Marguerita Beards, MD

## 2023-02-27 ENCOUNTER — Other Ambulatory Visit (HOSPITAL_COMMUNITY): Payer: Self-pay

## 2023-02-27 ENCOUNTER — Encounter: Payer: Self-pay | Admitting: Internal Medicine

## 2023-02-27 DIAGNOSIS — E890 Postprocedural hypothyroidism: Secondary | ICD-10-CM

## 2023-02-27 MED ORDER — UNITHROID 150 MCG PO TABS
150.0000 ug | ORAL_TABLET | Freq: Every day | ORAL | 3 refills | Status: DC
  Filled 2023-02-27: qty 30, 30d supply, fill #0

## 2023-02-28 ENCOUNTER — Other Ambulatory Visit (HOSPITAL_COMMUNITY): Payer: Self-pay

## 2023-02-28 DIAGNOSIS — G4733 Obstructive sleep apnea (adult) (pediatric): Secondary | ICD-10-CM | POA: Diagnosis not present

## 2023-02-28 DIAGNOSIS — G4731 Primary central sleep apnea: Secondary | ICD-10-CM | POA: Diagnosis not present

## 2023-03-01 MED ORDER — UNITHROID 150 MCG PO TABS: 150.0000 ug | ORAL_TABLET | Freq: Every day | ORAL | 3 refills | Status: DC

## 2023-03-01 NOTE — Addendum Note (Signed)
Addended by: Pollie Meyer on: 03/01/2023 07:57 AM   Modules accepted: Orders

## 2023-03-11 ENCOUNTER — Telehealth: Payer: Self-pay | Admitting: Obstetrics and Gynecology

## 2023-03-11 DIAGNOSIS — R300 Dysuria: Secondary | ICD-10-CM

## 2023-03-11 DIAGNOSIS — N898 Other specified noninflammatory disorders of vagina: Secondary | ICD-10-CM

## 2023-03-11 MED ORDER — FLUCONAZOLE 150 MG PO TABS: 150.0000 mg | ORAL_TABLET | Freq: Once | ORAL | 0 refills | Status: AC

## 2023-03-11 MED ORDER — NITROFURANTOIN MONOHYD MACRO 100 MG PO CAPS: 100.0000 mg | ORAL_CAPSULE | Freq: Two times a day (BID) | ORAL | 0 refills | Status: AC

## 2023-03-11 NOTE — Telephone Encounter (Signed)
Please contact patient to schedule an office visit with me.  She has been treated recently by the on call provider for two suspected urinary tract infections, which were close together.

## 2023-03-11 NOTE — Telephone Encounter (Signed)
Patient called answering service. Having burning with urination that started a few days ago and worsened today. She called the office earlier but did not get a response. Will prescribe macrobid 100mg  BID x 5 days. Also will gve a dose of diflucan per patient request. Advised patient to call office again tomorrow so she can arrange f/u wiith Dr Edward Jolly since she also had UTI symptoms a few weeks ago.   Marguerita Beards, MD

## 2023-03-12 NOTE — Telephone Encounter (Signed)
Please have the patient keep her appointment with me tomorrow.  I see her appointment was moved to the morning.   If she is in a lot of discomfort, she can start the Macrobid antibiotic.   I recommend a urinalysis and reflex culture.  I will need to examine her tomorrow.

## 2023-03-12 NOTE — Telephone Encounter (Signed)
VM's received from pt on 03/11/2023 @ 1044: stating that she is having reoccurring UTI's and is requesting rx to be sent and advise on how/what she can do for prevention?  VM left on 03/12/23 @ 0920: stating that she was prescribed an abx by on call physician but has appt scheduled with BS on 03/13/2023 and is inquiring if she needed to start taking abx or wait until she can be seen?  Rx sent by Dr. Florian Buff on 03/11/2023 for macrobid   Spoke w/ pt and inquired if she has had any UA/Ucxs performed at any external facilities since there weren't any recents in her record and she confirmed that she had not, only been taking medications. Has not started on macrobid yet.  Pt advised of importance of having tests ran if having sxs to ensure that she is receiving proper treatment. Pt voiced understanding.  Pt also advised to try to avoid taking abx until appt since medication can potentially mask test results. Scheduled for 03/13/23 @ 415.   Advised pt that I will notify BS of advise and notify her of any additional recommendations.   Routing to provider for review.

## 2023-03-12 NOTE — Telephone Encounter (Signed)
Spoke with patient. Advised per Dr. Edward Jolly.  Patient states no change in symptoms, plans to hold off on Macrobid until OV. Patient appreciative of call.   Routing to provider for final review. Patient is agreeable to disposition. Will close encounter.

## 2023-03-13 ENCOUNTER — Ambulatory Visit (INDEPENDENT_AMBULATORY_CARE_PROVIDER_SITE_OTHER): Payer: 59 | Admitting: Obstetrics and Gynecology

## 2023-03-13 ENCOUNTER — Encounter: Payer: Self-pay | Admitting: Obstetrics and Gynecology

## 2023-03-13 VITALS — BP 132/88 | Ht 65.0 in | Wt 349.0 lb

## 2023-03-13 DIAGNOSIS — Z780 Asymptomatic menopausal state: Secondary | ICD-10-CM | POA: Diagnosis not present

## 2023-03-13 DIAGNOSIS — F419 Anxiety disorder, unspecified: Secondary | ICD-10-CM | POA: Diagnosis not present

## 2023-03-13 DIAGNOSIS — B3732 Chronic candidiasis of vulva and vagina: Secondary | ICD-10-CM | POA: Diagnosis not present

## 2023-03-13 DIAGNOSIS — N898 Other specified noninflammatory disorders of vagina: Secondary | ICD-10-CM

## 2023-03-13 DIAGNOSIS — R300 Dysuria: Secondary | ICD-10-CM | POA: Diagnosis not present

## 2023-03-13 MED ORDER — MISOPROSTOL 200 MCG PO TABS: ORAL_TABLET | ORAL | 0 refills | Status: DC

## 2023-03-13 NOTE — Progress Notes (Signed)
GYNECOLOGY  VISIT   HPI: 57 y.o.   Married  [place]  female   G7P0 with No LMP recorded. (Menstrual status: IUD).   here for   recurrent UTI's- dysuria and frequency. She declines a physical exam today.   Treated by phone on 02/25/23 and 03/11/23 for UTIs by Dr. Florian Buff.  She received Rx for Macrobid and Diflucan each time.  Has not started the Macrobid yet from her latest phone call.  She states the combination usually works for her.   Started Jardiance.  Her vulvar area is sore.  Having some itching.   Having some hot flashes.  Some anxiety and depression.   Coming up on the anniversary of her daughter's death. Her mother has also passed away.  She struggles with the losses.   GYNECOLOGIC HISTORY: No LMP recorded. (Menstrual status: IUD). Contraception:  IUD 2015/2016 Menopausal hormone therapy:  n/a Last mammogram:  11/16/20 Breast Density category A, BI-RADS CATEGORY 1 neg  Last pap smear:   05/02/18 neg: HR HPV neg, 09/04/07 neg         OB History     Gravida  7   Para      Term      Preterm      AB      Living  7      SAB      IAB      Ectopic      Multiple      Live Births                 Patient Active Problem List   Diagnosis Date Noted   Chronic left hip pain 10/04/2020   De Quervain's tenosynovitis, right 06/16/2019   Patellofemoral arthritis of left knee 11/28/2018   Left knee pain 11/24/2018   Episodic lightheadedness 10/24/2018   Lumbar back pain with radiculopathy affecting left lower extremity 10/07/2018   Right ovarian cyst    OSA (obstructive sleep apnea), severe 06/23/2018   Anxiety 03/05/2018   Chronic nonintractable headache 03/05/2018   Hyperlipidemia 01/23/2018   Diabetes (HCC) 01/16/2018   DKA (diabetic ketoacidoses) 01/15/2018   Gastroesophageal reflux disease 10/05/2017   Temporal arteritis (HCC) 10/03/2017   Hypertension 09/28/2015   Morbid obesity (HCC) 05/17/2015   Umbilical hernia 05/17/2015    Palpitations 05/17/2015   Iron deficiency anemia 09/03/2007   Hypothyroidism following radioiodine therapy 09/02/2007   INTERSTITIAL CYSTITIS 09/01/2007   ALLERGIC RHINITIS 08/07/2007   DISC DISEASE, LUMBAR 08/07/2007    Past Medical History:  Diagnosis Date   Abnormal uterine bleeding    Anemia    COVID 12/2018   Depression    Diabetes mellitus without complication (HCC)    gestational   Elevated blood-pressure reading without diagnosis of hypertension    Endometriosis    Fibroids    GERD (gastroesophageal reflux disease)    Graves disease    Grief at loss of child    H/O gestational diabetes mellitus, not currently pregnant 09/28/2015   Hypertension    Hypothyroidism    Interstitial cystitis    Menorrhagia    Oropharyngeal candidiasis 01/16/2018   OSA (obstructive sleep apnea), severe 06/23/2018   Premature ventricular contractions    a. rare PVC by monitor 12/2016.   Right ovarian cyst    3 cm.   Thyroid disease    Uterine leiomyoma     Past Surgical History:  Procedure Laterality Date   APPENDECTOMY     ARTERY BIOPSY Left 10/25/2017  Procedure: BIOPSY TEMPORAL ARTERY;  Surgeon: Franky Macho, MD;  Location: [facility];  Service: General;  Laterality: Left;   CESAREAN SECTION     CHOLECYSTECTOMY     HERNIA REPAIR     incisional   TUMOR REMOVAL  fibroids    Current Outpatient Medications  Medication Sig Dispense Refill   ALPRAZolam (XANAX) 0.5 MG tablet Take 1 tablet (0.5 mg total) by mouth 2 (two) times daily as needed for anxiety. 60 tablet 0   Blood Glucose Monitoring Suppl (FREESTYLE LITE) w/Device KIT Use as directed. 1 kit 0   cholecalciferol (VITAMIN D) 1000 units tablet Take 1,000 Units by mouth daily.     clotrimazole (LOTRIMIN) 1 % cream Apply 1 application topically 2 (two) times daily. Use for 2 weeks at a time as needed. 30 g 2   diltiazem (TIAZAC) 120 MG 24 hr capsule Take 1 capsule (120 mg total) by mouth daily. 90 capsule 3   diphenoxylate-atropine  (LOMOTIL) 2.5-0.025 MG tablet Take 2 tablets by mouth 4 (four) times daily as needed for diarrhea or loose stools. 20 tablet 0   docusate sodium (COLACE) 100 MG capsule Take 1-3 capsules (100-300 mg total) by mouth daily as needed for mild constipation. 90 capsule 5   Dulaglutide (TRULICITY) 4.5 MG/0.5ML SOPN Inject 4.5 mg as directed once a week. 6 mL 1   empagliflozin (JARDIANCE) 10 MG TABS tablet Take 1 tablet (10 mg total) by mouth daily before breakfast. 30 tablet 5   famotidine (PEPCID) 20 MG tablet Take 1 tablet (20 mg total) by mouth 2 (two) times daily. 180 tablet 3   fluconazole (DIFLUCAN) 150 MG tablet Take 150 mg by mouth once.     glucose blood (FREESTYLE LITE) test strip Use as instructed 100 each 12   Lancets (FREESTYLE) lancets Use as instructed 100 each 12   levonorgestrel (MIRENA) 20 MCG/24HR IUD 1 each by Intrauterine route once.      losartan (COZAAR) 100 MG tablet Take 1 tablet (100 mg total) by mouth daily. 90 tablet 3   meloxicam (MOBIC) 15 MG tablet Take 1 tablet (15 mg total) by mouth daily. 30 tablet 3   methocarbamol (ROBAXIN) 750 MG tablet Take 1 tablet (750 mg total) by mouth 4 (four) times daily. 120 tablet 2   misoprostol (CYTOTEC) 200 MCG tablet Place one table (200 mcg) in the vagina the night before IUD removal and then place one tablet in the vagina the morning of the IUD removal. 2 tablet 0   Multiple Vitamin (MULTIVITAMIN) tablet Take 1 tablet by mouth daily.     nitrofurantoin, macrocrystal-monohydrate, (MACROBID) 100 MG capsule Take 1 capsule (100 mg total) by mouth 2 (two) times daily for 5 days. 10 capsule 0   rosuvastatin (CRESTOR) 10 MG tablet Take 1 tablet (10 mg total) by mouth daily. 90 tablet 3   UNITHROID 150 MCG tablet Take 1 tablet (150 mcg total) by mouth daily. 30 tablet 3   No current facility-administered medications for this visit.     ALLERGIES: Prednisone, Fluoxetine, Glimepiride, Prozac [fluoxetine hcl], Hydrocodone, and Metformin and  related  Family History  Problem Relation Age of Onset   Hypertension Mother    Cancer Mother    Breast cancer Mother 41   Hypertension Father    Diabetes Father    Cancer Father    Ovarian cancer Maternal Aunt    Thyroid disease Maternal Aunt        hypothyroid    Social History  Socioeconomic History   Marital status: Married    Spouse name: Not on file   Number of children: Not on file   Years of education: Not on file   Highest education level: Not on file  Occupational History   Not on file  Tobacco Use   Smoking status: Never   Smokeless tobacco: Never  Vaping Use   Vaping status: Never Used  Substance and Sexual Activity   Alcohol use: No   Drug use: No   Sexual activity: Never    Birth control/protection: I.U.D.    Comment: Mirena inserted 2015/2016  Other Topics Concern   Not on file  Social History Narrative   Caffeine: maybe 4 cups/day (tea/coffee)   Social Determinants of Health   Financial Resource Strain: Not on file  Food Insecurity: Not on file  Transportation Needs: Not on file  Physical Activity: Not on file  Stress: Not on file  Social Connections: Not on file  Intimate Partner Violence: Not on file    Review of Systems  Genitourinary:  Positive for dysuria and urgency.    PHYSICAL EXAMINATION:    BP 132/88 (BP Location: Right Arm, Patient Position: Sitting, Cuff Size: Large)   Ht 5\' 5"  (1.651 m)   Wt (!) 349 lb (158.3 kg)   BMI 58.08 kg/m     General appearance: alert, cooperative and appears stated age  ASSESSMENT  Recent suspected UTIs.  DM.  On Jardiance.  Probable yeast vulvovaginitis.  Menopausal female.  Expired IUD.  Strings lost and IUD location confirmed by CT scan 07/13/22 done for renal stones. Bereavement.  Hx right ovarian cyst and left hydrosalpinx noted on [place] 2020.   PLAN  Urinalysis:  sg1.015, pH 6.0, 0 - 5 WBC, NS RBC, few bacteria, few yeast.  UC sent.  She will start the Macrobid that was already  prescribed.  She has a course of Diflucan to take also.  We discussed menopause as a risk for UTI and diabetes and Jardiance as a risk for yeast infections.  I do recommend a visit to remove her IUD that is separate from her annual exam as this may require dilation of her cervix.  Rx for Cytotec 200 mcg to place in the cervix the night before and the am of the IUD removal.  She will update her mammogram.  We will address menopausal symptoms at a future visit.  She is not a good candidate for an SSRI/SNRI to treat menopausal symptoms because she takes daily Meloxicam.  I recommended grief counseling.  We discussed Grief Share, Hospice, pastoral care, reading.  Keep annual exam appointment for January, 2025.    30 min  total time was spent for this patient encounter, including preparation, face-to-face counseling with the patient, coordination of care, and documentation of the encounter.

## 2023-03-15 ENCOUNTER — Encounter: Payer: Self-pay | Admitting: Internal Medicine

## 2023-03-15 ENCOUNTER — Ambulatory Visit (INDEPENDENT_AMBULATORY_CARE_PROVIDER_SITE_OTHER): Payer: 59 | Admitting: Internal Medicine

## 2023-03-15 VITALS — BP 120/70 | HR 105 | Ht 65.0 in | Wt 350.2 lb

## 2023-03-15 DIAGNOSIS — Z8639 Personal history of other endocrine, nutritional and metabolic disease: Secondary | ICD-10-CM

## 2023-03-15 DIAGNOSIS — E890 Postprocedural hypothyroidism: Secondary | ICD-10-CM | POA: Diagnosis not present

## 2023-03-15 LAB — URINALYSIS, COMPLETE W/RFL CULTURE
Bilirubin Urine: NEGATIVE
Hgb urine dipstick: NEGATIVE
Hyaline Cast: NONE SEEN /LPF
Ketones, ur: NEGATIVE
Leukocyte Esterase: NEGATIVE
Nitrites, Initial: NEGATIVE
Protein, ur: NEGATIVE
RBC / HPF: NONE SEEN /HPF (ref 0–2)
Specific Gravity, Urine: 1.015 (ref 1.001–1.035)
pH: 6 (ref 5.0–8.0)

## 2023-03-15 LAB — URINE CULTURE
MICRO NUMBER:: 15483293
SPECIMEN QUALITY:: ADEQUATE

## 2023-03-15 LAB — TSH: TSH: 0.09 u[IU]/mL — ABNORMAL LOW (ref 0.35–5.50)

## 2023-03-15 LAB — T4, FREE: Free T4: 1.28 ng/dL (ref 0.60–1.60)

## 2023-03-15 LAB — CULTURE INDICATED

## 2023-03-15 MED ORDER — UNITHROID 137 MCG PO TABS: 137.0000 ug | ORAL_TABLET | Freq: Every day | ORAL | 6 refills | Status: DC

## 2023-03-15 NOTE — Progress Notes (Signed)
Patient ID: Erin [name], female   DOB: [birth year], 57 y.o.   MRN: [id number]   HPI  Erin [name] is a 57 y.o.-year-old female, initially referred by her PCP, Dr. Lawerance Bach, presenting for follow-up for postablative hypothyroidism.  She previously saw Dr. Lucianne Muss.   Last visit with me 1 year and 5 months ago.  She is not usually compliant with appointments.  Interim history: No increased urination, blurry vision, nausea, chest pain. She does mention occasional dysphagia with drier foods >> but this improved.  Reviewed history: Pt. has been dx with hypothyroidism after RAI treatment for Graves' disease in 08/2008 >> on Unithroid d.a.w.. She had a lot of variability in the TFTs on generic LT4.  In the past she was not taking the medication correctly and we discussed about how to do so.  At last visit, TSH was elevated so I advised her to increase her LT4 dose.  However, she did not return for repeat labs afterwards and, upon questioning, she was still on Unithroid 100 mcg daily...  We increased the dose to 112 mcg daily in 05/2020.   We increased the dose to 137 mcg daily in 03/2021.  We increased the dose to 150 mcg daily in 04/2021.  She is taking this: - in am - fasting - at least 30 min from b'fast - no calcium - no iron - no multivitamins - no PPIs, on H2 blocker - evening - off Biotin  Reviewed her TFTs: Lab Results  Component Value Date   TSH 0.36 12/26/2022   TSH 3.27 10/05/2021   TSH 3.39 07/12/2021   TSH 5.67 (H) 05/24/2021   TSH 11.52 (H) 04/05/2021   TSH 5.98 (H) 06/22/2020   TSH 6.72 (H) 06/08/2020   TSH 8.72 (H) 10/24/2018   TSH 1.34 05/16/2018   TSH 0.43 03/13/2018   FREET4 0.92 10/05/2021   FREET4 0.96 07/12/2021   FREET4 0.82 05/24/2021   FREET4 0.79 04/05/2021   FREET4 0.87 06/22/2020   FREET4 0.83 05/16/2018   FREET4 1.14 11/27/2017   FREET4 1.11 08/27/2017   FREET4 0.67 06/27/2017   FREET4 1.17 10/29/2016   T3FREE 6.7 (H) 09/04/2007   Antithyroid  antibodies: No results found for: "THGAB" No components found for: "TPOAB"  Pt denies: - feeling nodules in neck - hoarseness - choking - SOB with lying down She has  dysphagia with dry foods.   She has + FH of thyroid disorders in: mother, M aunts. No FH of thyroid cancer. No h/o radiation tx to head or neck. No herbal supplements but she plans to start curcumin. No Biotin use. No recent steroids use.   She also has uncontrolled diabetes, with DKA in 12/2017. She previously wanted me to also start managing this, but she was lost to follow-up for me afterwards.  This is is currently managed by PCP.  She is on: - Trulicity 4.5 mg weekly >> nausea and vomiting - Jardiance 10 mg daily Amaryl caused stomach discomfort.    Reviewed HbA1c levels: Lab Results  Component Value Date   HGBA1C 9.6 (H) 12/26/2022   HGBA1C 7.6 (H) 05/25/2022   HGBA1C 9.1 (H) 01/17/2022   HGBA1C 9.8 (H) 09/25/2021   HGBA1C 9.6 (H) 02/01/2021   HGBA1C 8.6 (H) 06/08/2020   HGBA1C 7.9 (H) 09/16/2019   HGBA1C 7.2 (H) 02/03/2019   HGBA1C 6.9 (H) 10/24/2018   HGBA1C 6.2 (A) 08/29/2018   No CKD: Lab Results  Component Value Date   BUN 15 12/26/2022  Lab Results  Component Value Date   CREATININE 0.81 12/26/2022   Lab Results  Component Value Date   MICRALBCREAT 1.0 12/26/2022   MICRALBCREAT 0.7 09/25/2021  On Cozaar 100 mg daily.  + HL: Lab Results  Component Value Date   CHOL 160 12/26/2022   HDL 42.30 12/26/2022   LDLCALC 93 12/26/2022   TRIG 126.0 12/26/2022   CHOLHDL 4 12/26/2022  She is on Crestor 10 mg daily.  Last eye exam: 06/06/2022: No DR-reviewed note: 1. Type 2 diabetes mellitus without complication, without long-term current use of insulin (HCC) No retinopathy OU  2. Bilateral dry eyes Excess tearing Will try at's bid to qid OU If no improvement, call back can consider oculoplastic consult  3. Hyperopia with astigmatism and presbyopia, bilateral rx dispensed   Hx of  Hypothyroidism following radioiodine therapy  She saw Dr. Steele Sizer in the past.  She was on high doses on Prednisone (60 mg) in the past, then decreased.  Now off prednisone.  ROS: Signs see HPI  I reviewed pt's medications, allergies, PMH, social hx, family hx, and changes were documented in the history of present illness. Otherwise, unchanged from my initial visit note.  Past Medical History:  Diagnosis Date   Abnormal uterine bleeding    Anemia    COVID 12/2018   Depression    Diabetes mellitus without complication (HCC)    gestational   Elevated blood-pressure reading without diagnosis of hypertension    Endometriosis    Fibroids    GERD (gastroesophageal reflux disease)    Graves disease    Grief at loss of child    H/O gestational diabetes mellitus, not currently pregnant 09/28/2015   Hypertension    Hypothyroidism    Interstitial cystitis    Menorrhagia    Oropharyngeal candidiasis 01/16/2018   OSA (obstructive sleep apnea), severe 06/23/2018   Premature ventricular contractions    a. rare PVC by monitor 12/2016.   Right ovarian cyst    3 cm.   Thyroid disease    Uterine leiomyoma    Past Surgical History:  Procedure Laterality Date   APPENDECTOMY     ARTERY BIOPSY Left 10/25/2017   Procedure: BIOPSY TEMPORAL ARTERY;  Surgeon: Franky Macho, MD;  Location: [facility];  Service: General;  Laterality: Left;   CESAREAN SECTION     CHOLECYSTECTOMY     HERNIA REPAIR     incisional   TUMOR REMOVAL  fibroids   Social History   Socioeconomic History   Marital status: Married    Spouse name: Not on file   Number of children: Not on file   Years of education: Not on file   Highest education level: Not on file  Occupational History   Not on file  Tobacco Use   Smoking status: Never   Smokeless tobacco: Never  Vaping Use   Vaping status: Never Used  Substance and Sexual Activity   Alcohol use: No   Drug use: No   Sexual activity: Never    Birth  control/protection: I.U.D.    Comment: Mirena inserted 2015/2016  Other Topics Concern   Not on file  Social History Narrative   Caffeine: maybe 4 cups/day (tea/coffee)   Social Determinants of Health   Financial Resource Strain: Not on file  Food Insecurity: Not on file  Transportation Needs: Not on file  Physical Activity: Not on file  Stress: Not on file  Social Connections: Not on file  Intimate Partner Violence: Not on file  Current Outpatient Medications on File Prior to Visit  Medication Sig Dispense Refill   ALPRAZolam (XANAX) 0.5 MG tablet Take 1 tablet (0.5 mg total) by mouth 2 (two) times daily as needed for anxiety. 60 tablet 0   Blood Glucose Monitoring Suppl (FREESTYLE LITE) w/Device KIT Use as directed. 1 kit 0   cholecalciferol (VITAMIN D) 1000 units tablet Take 1,000 Units by mouth daily.     clotrimazole (LOTRIMIN) 1 % cream Apply 1 application topically 2 (two) times daily. Use for 2 weeks at a time as needed. 30 g 2   diltiazem (TIAZAC) 120 MG 24 hr capsule Take 1 capsule (120 mg total) by mouth daily. 90 capsule 3   diphenoxylate-atropine (LOMOTIL) 2.5-0.025 MG tablet Take 2 tablets by mouth 4 (four) times daily as needed for diarrhea or loose stools. 20 tablet 0   docusate sodium (COLACE) 100 MG capsule Take 1-3 capsules (100-300 mg total) by mouth daily as needed for mild constipation. 90 capsule 5   Dulaglutide (TRULICITY) 4.5 MG/0.5ML SOPN Inject 4.5 mg as directed once a week. 6 mL 1   empagliflozin (JARDIANCE) 10 MG TABS tablet Take 1 tablet (10 mg total) by mouth daily before breakfast. 30 tablet 5   famotidine (PEPCID) 20 MG tablet Take 1 tablet (20 mg total) by mouth 2 (two) times daily. 180 tablet 3   fluconazole (DIFLUCAN) 150 MG tablet Take 150 mg by mouth once.     glucose blood (FREESTYLE LITE) test strip Use as instructed 100 each 12   Lancets (FREESTYLE) lancets Use as instructed 100 each 12   levonorgestrel (MIRENA) 20 MCG/24HR IUD 1 each by  Intrauterine route once.      losartan (COZAAR) 100 MG tablet Take 1 tablet (100 mg total) by mouth daily. 90 tablet 3   meloxicam (MOBIC) 15 MG tablet Take 1 tablet (15 mg total) by mouth daily. 30 tablet 3   methocarbamol (ROBAXIN) 750 MG tablet Take 1 tablet (750 mg total) by mouth 4 (four) times daily. 120 tablet 2   misoprostol (CYTOTEC) 200 MCG tablet Place one table (200 mcg) in the vagina the night before IUD removal and then place one tablet in the vagina the morning of the IUD removal. 2 tablet 0   Multiple Vitamin (MULTIVITAMIN) tablet Take 1 tablet by mouth daily.     nitrofurantoin, macrocrystal-monohydrate, (MACROBID) 100 MG capsule Take 1 capsule (100 mg total) by mouth 2 (two) times daily for 5 days. 10 capsule 0   rosuvastatin (CRESTOR) 10 MG tablet Take 1 tablet (10 mg total) by mouth daily. 90 tablet 3   UNITHROID 150 MCG tablet Take 1 tablet (150 mcg total) by mouth daily. 30 tablet 3   No current facility-administered medications on file prior to visit.   Allergies  Allergen Reactions   Prednisone Other (See Comments)    Raises blood sugar so high that she receives intensive care   Fluoxetine    Glimepiride     Stomach upset   Prozac [Fluoxetine Hcl] Nausea Only   Hydrocodone Hives, Itching and Rash    On thighs    Metformin And Related     diarrhea   Family History  Problem Relation Age of Onset   Hypertension Mother    Cancer Mother    Breast cancer Mother 31   Hypertension Father    Diabetes Father    Cancer Father    Ovarian cancer Maternal Aunt    Thyroid disease Maternal Aunt  hypothyroid   PE: BP 120/70   Pulse (!) 105   Ht 5\' 5"  (1.651 m)   Wt (!) 350 lb 3.2 oz (158.8 kg)   SpO2 99%   BMI 58.28 kg/m  Wt Readings from Last 3 Encounters:  03/15/23 (!) 350 lb 3.2 oz (158.8 kg)  03/13/23 (!) 349 lb (158.3 kg)  12/26/22 (!) 352 lb (159.7 kg)   Constitutional: overweight, in NAD Eyes:  EOMI, no exophthalmos ENT: no neck masses, no  cervical lymphadenopathy Cardiovascular: tachycardia, RR, No MRG Respiratory: CTA B Musculoskeletal: no deformities Skin:no rashes Neurological: no tremor with outstretched hands  ASSESSMENT: 1. Hypothyroidism - After RAI treatment for Graves' disease  2. 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 Patient with longstanding, uncontrolled, hypothyroidism, on levothyroxine therapy.  She is not usually compliant with appointments.  She returns now, 1 year and 5 months after the previous appointment. - latest thyroid labs reviewed with pt. >> normal: Lab Results  Component Value Date   TSH 0.36 12/26/2022  - she continues on LT4 150 mcg daily - pt feels good on this dose. - we discussed about taking the thyroid hormone every day, with water, >30 minutes before breakfast, separated by >4 hours from acid reflux medications, calcium, iron, multivitamins. Pt. is taking it correctly. - will check thyroid tests today: TSH and fT4 - If labs are abnormal, she will need to return for repeat TFTs in 1.5 months - RTC in 1 year  2 DM 2/2 steroid use -In the past, she also wanted me to manage this but she was not compliant with appointments so she continues to see PCP for this problem. -We did discuss at the previous visit that this is a serious problem, and her latest HbA1c was quite high, 9.6%, predisposing her to complications.  Previous value was better, at 7.6% in 05/2022. -At today's visit, she tells me that she is on Trulicity, but she does not tolerate this due to nausea/vomiting.  She is planning to discuss with PCP about this.  She is also on Farxiga but this causes urinary tract infections.  She will most likely benefit from insulin.  Needs refills for 90 days - Unithroid - [facility] (she gets a good price with a coupon).  Component     Latest Ref Rng 03/15/2023  TSH     0.35 - 5.50 uIU/mL 0.09 (L)   T4,Free(Direct)     0.60 - 1.60 ng/dL 7.82   TSH is suppressed >> will advise her to decrease the  dose of LT4 to 137 mcg daily and repeat her test in 1.5 months.  Carlus Pavlov, MD PhD [facility]

## 2023-03-15 NOTE — Patient Instructions (Signed)
Please continue Levothyroxine 150 mcg daily.  Take the thyroid hormone every day, with water, at least 30 minutes before breakfast, separated by at least 4 hours from: - acid reflux medications - calcium - iron - multivitamins  Please stop at the lab.  Please come back for a follow-up appointment in 1 year.

## 2023-03-18 ENCOUNTER — Encounter: Payer: Self-pay | Admitting: Internal Medicine

## 2023-03-27 ENCOUNTER — Telehealth: Payer: Self-pay

## 2023-03-27 ENCOUNTER — Ambulatory Visit: Payer: 59 | Admitting: Obstetrics and Gynecology

## 2023-03-27 ENCOUNTER — Encounter: Payer: Self-pay | Admitting: Obstetrics and Gynecology

## 2023-03-27 VITALS — BP 116/70 | HR 104 | Wt 350.0 lb

## 2023-03-27 DIAGNOSIS — N760 Acute vaginitis: Secondary | ICD-10-CM

## 2023-03-27 DIAGNOSIS — B3732 Chronic candidiasis of vulva and vagina: Secondary | ICD-10-CM | POA: Diagnosis not present

## 2023-03-27 DIAGNOSIS — R300 Dysuria: Secondary | ICD-10-CM | POA: Diagnosis not present

## 2023-03-27 LAB — WET PREP FOR TRICH, YEAST, CLUE

## 2023-03-27 MED ORDER — FLUCONAZOLE 150 MG PO TABS: ORAL_TABLET | ORAL | 0 refills | Status: DC

## 2023-03-27 MED ORDER — NYSTATIN 100000 UNIT/GM EX POWD: 1.0000 | Freq: Three times a day (TID) | CUTANEOUS | 2 refills | Status: DC

## 2023-03-27 MED ORDER — FLUCONAZOLE 150 MG PO TABS: 150.0000 mg | ORAL_TABLET | Freq: Once | ORAL | 0 refills | Status: DC

## 2023-03-27 NOTE — Telephone Encounter (Signed)
Pt notified and voiced understanding. Scheduled for 03/27/2023.   Dr. Edward Jolly aware. Encounter closed.

## 2023-03-27 NOTE — Addendum Note (Signed)
Addended by: Pincus Sanes on: 03/27/2023 12:06 PM   Modules accepted: Orders

## 2023-03-27 NOTE — Patient Instructions (Signed)

## 2023-03-27 NOTE — Telephone Encounter (Signed)
I recommend an office visit for evaluation and examination.  Patient has been treated by phone on several occasions.   These are recurrent symptoms.   We can develop a plan to help her better with an office visit.   I am happy to see her and help!

## 2023-03-27 NOTE — Telephone Encounter (Signed)
Pt LVM in triage line stating that she needs help with the reoccurring yeast sxs.   Pt taking Jardiance for DM.  Pt recent OV on 03/13/23.  Scheduled for f/u for IUD removal on 04/17/2023.   Spoke w/ pt and she confirmed that her current sxs are vaginal itching and vulvar/perineal burning.   Pt reports last night she did an insert of generic monistat and took an AZO as well. Denied signs of a bladder infection at this time but does tend to burn when urine touches the skin that is inflamed.   Pt reports she does usually get relief when taking the oral fluconazole but feels as if her sxs reoccur the next day that she takes her next dose of the Jardiance. Pt states that her PCP whom prescribes her [place] Pepper has her referring to [place] for her yeast sxs.  Please advise.

## 2023-03-27 NOTE — Progress Notes (Signed)
GYNECOLOGY  VISIT   HPI: 57 y.o.   Married  [place]  female   G7P0 with No LMP recorded. (Menstrual status: IUD).   here for   yeast- pt has noticed itching and burning. No vaginal discharge.  Area near rectum is also sore.   Took one Diflucan 2 weeks ago.  It helps her symptoms.   Patient has recurrent yeast infections on Jardiance, which she started about one month ago.    She has been treated by phone after hours on a couple of occasions for dysuria and received Rx for Macrobid and Diflucan.  Her last UC was 03/13/23 and was negative for infection.   GYNECOLOGIC HISTORY: No LMP recorded. (Menstrual status: IUD). Contraception:  IUD-2015/2016 Menopausal hormone therapy:  n/a Last mammogram:  11/16/20 Breast Density Cat A, BI-RADS CAT 1 neg Last pap smear:   05/02/18 neg: HR HPV neg        OB History     Gravida  7   Para      Term      Preterm      AB      Living  7      SAB      IAB      Ectopic      Multiple      Live Births                 Patient Active Problem List   Diagnosis Date Noted   Chronic left hip pain 10/04/2020   De Quervain's tenosynovitis, right 06/16/2019   Patellofemoral arthritis of left knee 11/28/2018   Left knee pain 11/24/2018   Episodic lightheadedness 10/24/2018   Lumbar back pain with radiculopathy affecting left lower extremity 10/07/2018   Right ovarian cyst    OSA (obstructive sleep apnea), severe 06/23/2018   Anxiety 03/05/2018   Chronic nonintractable headache 03/05/2018   Hyperlipidemia 01/23/2018   Diabetes (HCC) 01/16/2018   DKA (diabetic ketoacidosis) (HCC) 01/15/2018   Gastroesophageal reflux disease 10/05/2017   Temporal arteritis (HCC) 10/03/2017   Hypertension 09/28/2015   Morbid obesity (HCC) 05/17/2015   Umbilical hernia 05/17/2015   Palpitations 05/17/2015   Iron deficiency anemia 09/03/2007   Hypothyroidism following radioiodine therapy 09/02/2007   INTERSTITIAL CYSTITIS 09/01/2007    ALLERGIC RHINITIS 08/07/2007   DISC DISEASE, LUMBAR 08/07/2007    Past Medical History:  Diagnosis Date   Abnormal uterine bleeding    Anemia    COVID 12/2018   Depression    Diabetes mellitus without complication (HCC)    gestational   Elevated blood-pressure reading without diagnosis of hypertension    Endometriosis    Fibroids    GERD (gastroesophageal reflux disease)    Graves disease    Grief at loss of child    H/O gestational diabetes mellitus, not currently pregnant 09/28/2015   Hypertension    Hypothyroidism    Interstitial cystitis    Menorrhagia    Oropharyngeal candidiasis 01/16/2018   OSA (obstructive sleep apnea), severe 06/23/2018   Premature ventricular contractions    a. rare PVC by monitor 12/2016.   Right ovarian cyst    3 cm.   Thyroid disease    Uterine leiomyoma     Past Surgical History:  Procedure Laterality Date   APPENDECTOMY     ARTERY BIOPSY Left 10/25/2017   Procedure: BIOPSY TEMPORAL ARTERY;  Surgeon: Franky Macho, MD;  Location: [facility];  Service: General;  Laterality: Left;   CESAREAN SECTION  CHOLECYSTECTOMY     HERNIA REPAIR     incisional   TUMOR REMOVAL  fibroids    Current Outpatient Medications  Medication Sig Dispense Refill   ALPRAZolam (XANAX) 0.5 MG tablet Take 1 tablet (0.5 mg total) by mouth 2 (two) times daily as needed for anxiety. 60 tablet 0   Blood Glucose Monitoring Suppl (FREESTYLE LITE) w/Device KIT Use as directed. 1 kit 0   cholecalciferol (VITAMIN D) 1000 units tablet Take 1,000 Units by mouth daily.     clotrimazole (LOTRIMIN) 1 % cream Apply 1 application topically 2 (two) times daily. Use for 2 weeks at a time as needed. 30 g 2   diltiazem (TIAZAC) 120 MG 24 hr capsule Take 1 capsule (120 mg total) by mouth daily. 90 capsule 3   diphenoxylate-atropine (LOMOTIL) 2.5-0.025 MG tablet Take 2 tablets by mouth 4 (four) times daily as needed for diarrhea or loose stools. 20 tablet 0   docusate sodium (COLACE) 100 MG  capsule Take 1-3 capsules (100-300 mg total) by mouth daily as needed for mild constipation. 90 capsule 5   empagliflozin (JARDIANCE) 10 MG TABS tablet Take 1 tablet (10 mg total) by mouth daily before breakfast. 30 tablet 5   famotidine (PEPCID) 20 MG tablet Take 1 tablet (20 mg total) by mouth 2 (two) times daily. 180 tablet 3   glucose blood (FREESTYLE LITE) test strip Use as instructed 100 each 12   Lancets (FREESTYLE) lancets Use as instructed 100 each 12   levonorgestrel (MIRENA) 20 MCG/24HR IUD 1 each by Intrauterine route once.      losartan (COZAAR) 100 MG tablet Take 1 tablet (100 mg total) by mouth daily. 90 tablet 3   meloxicam (MOBIC) 15 MG tablet Take 1 tablet (15 mg total) by mouth daily. 30 tablet 3   methocarbamol (ROBAXIN) 750 MG tablet Take 1 tablet (750 mg total) by mouth 4 (four) times daily. 120 tablet 2   misoprostol (CYTOTEC) 200 MCG tablet Place one table (200 mcg) in the vagina the night before IUD removal and then place one tablet in the vagina the morning of the IUD removal. 2 tablet 0   Multiple Vitamin (MULTIVITAMIN) tablet Take 1 tablet by mouth daily.     rosuvastatin (CRESTOR) 10 MG tablet Take 1 tablet (10 mg total) by mouth daily. 90 tablet 3   UNITHROID 137 MCG tablet Take 1 tablet (137 mcg total) by mouth daily before breakfast. 45 tablet 6   fluconazole (DIFLUCAN) 150 MG tablet Take one tablet (150 mg) by mouth every 3 days for 3 doses.  Then take one tablet by mouth weekly. 15 tablet 0   nystatin (MYCOSTATIN/NYSTOP) powder Apply 1 Application topically 3 (three) times daily. Apply to affected area for up to 7 days 30 g 2   No current facility-administered medications for this visit.     ALLERGIES: Prednisone, Trulicity [dulaglutide], Fluoxetine, Glimepiride, Prozac [fluoxetine hcl], Hydrocodone, and Metformin and related  Family History  Problem Relation Age of Onset   Hypertension Mother    Cancer Mother    Breast cancer Mother 31   Hypertension  Father    Diabetes Father    Cancer Father    Ovarian cancer Maternal Aunt    Thyroid disease Maternal Aunt        hypothyroid    Social History   Socioeconomic History   Marital status: Married    Spouse name: Not on file   Number of children: Not on file   Years  of education: Not on file   Highest education level: Not on file  Occupational History   Not on file  Tobacco Use   Smoking status: Never   Smokeless tobacco: Never  Vaping Use   Vaping status: Never Used  Substance and Sexual Activity   Alcohol use: No   Drug use: No   Sexual activity: Never    Birth control/protection: I.U.D.    Comment: Mirena inserted 2015/2016  Other Topics Concern   Not on file  Social History Narrative   Caffeine: maybe 4 cups/day (tea/coffee)   Social Determinants of Health   Financial Resource Strain: Not on file  Food Insecurity: Not on file  Transportation Needs: Not on file  Physical Activity: Not on file  Stress: Not on file  Social Connections: Not on file  Intimate Partner Violence: Not on file    Review of Systems  Genitourinary:  Positive for dysuria.    PHYSICAL EXAMINATION:    BP 116/70 (BP Location: Right Arm, Patient Position: Sitting, Cuff Size: Large)   Pulse (!) 104   Wt (!) 350 lb (158.8 kg)   SpO2 97%   BMI 58.24 kg/m     General appearance: alert, cooperative and appears stated age  Skin: Skin color, texture, turgor normal. Patches of erythema along the flexural skin folds of the thighs, mons pubis,    Pelvic: External genitalia:  greyish coloration of the vulva and               Urethra:  normal appearing urethra with no masses, tenderness or lesions              Bartholins and Skenes: normal                 Vagina: normal appearing vagina with normal color and discharge, no lesions              Cervix:  IUD strings noted.  Yellow frothy discharge of the vagina.  Vaginal mucosa with generalized erythema.                 Bimanual Exam:  Uterus:   normal size, contour, position, consistency, mobility, non-tender              Adnexa: no mass, fullness, tenderness          Chaperone was present for exam:  Silas Flood.  ASSESSMENT  Vulvovaginitis.  Candida infection of flexural folds and of vagina. On Jardiance.   Dysuria.   PLAN  Wet prep:  yeast present, negative clue cells and negative trichomonas.  WBC TNTC. Diflucan 150 mg po q 3 days x 3 doses.  Then start Diflucan 150 mg po weekly for 12 weeks.  Nystatin powder tid to flexural skin folds tid for one weeks.  Refills given.  Urinalysis:  sg 1.010, ph 6.0, 1+ leukocytes, 20 - 40 WBC, 0- 2 RBC, 0 - 5 squams, moderate bacteria.  UC sent.  Patient agrees to wait for UC results prior to any treatment for potential UTI as she has recurrent symptoms that are likely related to her recurrent/persistent yeast vulvovaginitis.  She will see her PCP this week in follow up for her Jardiance.  She will keep her appointment for her annual exam and IUD removal later this month.    25 min  total time was spent for this patient encounter, including preparation, face-to-face counseling with the patient, coordination of care, and documentation of the encounter.

## 2023-03-28 ENCOUNTER — Encounter: Payer: Self-pay | Admitting: Internal Medicine

## 2023-03-28 LAB — URINALYSIS, COMPLETE W/RFL CULTURE
Bilirubin Urine: NEGATIVE
Hyaline Cast: NONE SEEN /[LPF]
Ketones, ur: NEGATIVE
Nitrites, Initial: NEGATIVE
Protein, ur: NEGATIVE
Specific Gravity, Urine: 1.01 (ref 1.001–1.035)
pH: 6 (ref 5.0–8.0)

## 2023-03-28 LAB — CULTURE INDICATED

## 2023-03-28 LAB — URINE CULTURE
MICRO NUMBER:: 15542181
Result:: NO GROWTH
SPECIMEN QUALITY:: ADEQUATE

## 2023-03-28 NOTE — Progress Notes (Signed)
Subjective:    Patient ID: Erin [name], female    DOB: 1965/06/30, 57 y.o.   MRN: [id number]      HPI Shanica is here for a Physical exam and her chronic medical problems.   Having frequent yeast infections on jardiance   Medications and allergies reviewed with patient and updated if appropriate.  Current Outpatient Medications on File Prior to Visit  Medication Sig Dispense Refill   ALPRAZolam (XANAX) 0.5 MG tablet Take 1 tablet (0.5 mg total) by mouth 2 (two) times daily as needed for anxiety. 60 tablet 0   Blood Glucose Monitoring Suppl (FREESTYLE LITE) w/Device KIT Use as directed. 1 kit 0   cholecalciferol (VITAMIN D) 1000 units tablet Take 1,000 Units by mouth daily.     clotrimazole (LOTRIMIN) 1 % cream Apply 1 application topically 2 (two) times daily. Use for 2 weeks at a time as needed. 30 g 2   diltiazem (TIAZAC) 120 MG 24 hr capsule Take 1 capsule (120 mg total) by mouth daily. 90 capsule 3   diphenoxylate-atropine (LOMOTIL) 2.5-0.025 MG tablet Take 2 tablets by mouth 4 (four) times daily as needed for diarrhea or loose stools. 20 tablet 0   docusate sodium (COLACE) 100 MG capsule Take 1-3 capsules (100-300 mg total) by mouth daily as needed for mild constipation. 90 capsule 5   empagliflozin (JARDIANCE) 10 MG TABS tablet Take 1 tablet (10 mg total) by mouth daily before breakfast. 30 tablet 5   famotidine (PEPCID) 20 MG tablet Take 1 tablet (20 mg total) by mouth 2 (two) times daily. 180 tablet 3   fluconazole (DIFLUCAN) 150 MG tablet Take one tablet (150 mg) by mouth every 3 days for 3 doses.  Then take one tablet by mouth weekly. 15 tablet 0   glucose blood (FREESTYLE LITE) test strip Use as instructed 100 each 12   Lancets (FREESTYLE) lancets Use as instructed 100 each 12   levonorgestrel (MIRENA) 20 MCG/24HR IUD 1 each by Intrauterine route once.      losartan (COZAAR) 100 MG tablet Take 1 tablet (100 mg total) by mouth daily. 90 tablet 3   meloxicam (MOBIC)  15 MG tablet Take 1 tablet (15 mg total) by mouth daily. 30 tablet 3   methocarbamol (ROBAXIN) 750 MG tablet Take 1 tablet (750 mg total) by mouth 4 (four) times daily. 120 tablet 2   misoprostol (CYTOTEC) 200 MCG tablet Place one table (200 mcg) in the vagina the night before IUD removal and then place one tablet in the vagina the morning of the IUD removal. 2 tablet 0   Multiple Vitamin (MULTIVITAMIN) tablet Take 1 tablet by mouth daily.     nystatin (MYCOSTATIN/NYSTOP) powder Apply 1 Application topically 3 (three) times daily. Apply to affected area for up to 7 days 30 g 2   rosuvastatin (CRESTOR) 10 MG tablet Take 1 tablet (10 mg total) by mouth daily. 90 tablet 3   UNITHROID 137 MCG tablet Take 1 tablet (137 mcg total) by mouth daily before breakfast. 45 tablet 6   No current facility-administered medications on file prior to visit.    Review of Systems  Constitutional:  Negative for fever.  Eyes:  Negative for visual disturbance.  Respiratory:  Positive for shortness of breath (with stairs). Negative for cough and wheezing.   Cardiovascular:  Positive for palpitations (a few times a week) and leg swelling (chronic on left side). Negative for chest pain.  Gastrointestinal:  Positive for diarrhea (last couple  of days). Negative for abdominal pain, blood in stool and constipation.       GERD frequently  Genitourinary:  Positive for vaginal pain. Negative for dysuria.       Vulvar and clitoral itch  Musculoskeletal:  Positive for arthralgias (L knee > R knee) and back pain (chronic controlled).  Skin:  Negative for rash.  Neurological:  Positive for light-headedness (occ) and headaches (chronic).  Psychiatric/Behavioral:  Negative for dysphoric mood. The patient is not nervous/anxious.        Objective:   Vitals:   03/29/23 1506  BP: 130/82  Pulse: 94  Temp: 98.2 F (36.8 C)  SpO2: 95%   Filed Weights   03/29/23 1506  Weight: (!) 351 lb (159.2 kg)   Body mass index is  58.41 kg/m.  BP Readings from Last 3 Encounters:  03/29/23 130/82  03/27/23 116/70  03/15/23 120/70    Wt Readings from Last 3 Encounters:  03/29/23 (!) 351 lb (159.2 kg)  03/27/23 (!) 350 lb (158.8 kg)  03/15/23 (!) 350 lb 3.2 oz (158.8 kg)       Physical Exam Constitutional: She appears well-developed and well-nourished. No distress.  HENT:  Head: Normocephalic and atraumatic.  Right Ear: External ear normal. Normal ear canal and TM Left Ear: External ear normal.  Normal ear canal and TM Mouth/Throat: Oropharynx is clear and moist.  Eyes: Conjunctivae normal.  Neck: Neck supple. No tracheal deviation present. No thyromegaly present.  No carotid bruit  Cardiovascular: Normal rate, regular rhythm and normal heart sounds.   No murmur heard.  No edema. Pulmonary/Chest: Effort normal and breath sounds normal. No respiratory distress. She has no wheezes. She has no rales.  Breast: deferred   Abdominal: Soft. She exhibits no distension. There is no tenderness.  Lymphadenopathy: She has no cervical adenopathy.  Skin: Skin is warm and dry. She is not diaphoretic.  Psychiatric: She has a normal mood and affect. Her behavior is normal.     Lab Results  Component Value Date   WBC 12.0 (H) 12/26/2022   HGB 12.7 12/26/2022   HCT 39.3 12/26/2022   PLT 305.0 12/26/2022   GLUCOSE 167 (H) 12/26/2022   CHOL 160 12/26/2022   TRIG 126.0 12/26/2022   HDL 42.30 12/26/2022   LDLCALC 93 12/26/2022   ALT 29 12/26/2022   AST 22 12/26/2022   NA 138 12/26/2022   K 3.9 12/26/2022   CL 101 12/26/2022   CREATININE 0.81 12/26/2022   BUN 15 12/26/2022   CO2 28 12/26/2022   TSH 0.09 (L) 03/15/2023   HGBA1C 9.6 (H) 12/26/2022   MICROALBUR 2.5 (H) 12/26/2022         Assessment & Plan:   Physical exam: Screening blood work  ordered Exercise not regular Weight  obese - advised weight loss Substance abuse  none   Reviewed recommended immunizations.   Health Maintenance  Topic  Date Due   FOOT EXAM  Never done   Colonoscopy  Never done   DTaP/Tdap/Td (2 - Tdap) 06/25/2014   Zoster Vaccines- Shingrix (1 of 2) Never done   Cervical Cancer Screening (HPV/Pap Cotest)  05/02/2021   MAMMOGRAM  11/17/2022   COVID-19 Vaccine (2 - 2023-24 season) 02/24/2023   OPHTHALMOLOGY EXAM  06/07/2023   HEMOGLOBIN A1C  06/28/2023   Diabetic kidney evaluation - eGFR measurement  12/26/2023   Diabetic kidney evaluation - Urine ACR  12/26/2023   INFLUENZA VACCINE  Completed   Hepatitis C Screening  Completed  HIV Screening  Completed   HPV VACCINES  Aged Out          See Problem List for Assessment and Plan of chronic medical problems.

## 2023-03-28 NOTE — Patient Instructions (Addendum)
Call and schedule your mammogram - [facility] --- Schedule an appointment by calling [phone]     Blood work was ordered.   The lab is on the first floor.    Medications changes include :   rybelsus 3 mg daily    A referral was ordered GI for a colonoscopy and someone will call you to schedule an appointment.     Return in about 3 months (around 06/29/2023) for follow up.   Health Maintenance, Female Adopting a healthy lifestyle and getting preventive care are important in promoting health and wellness. Ask your health care provider about: The right schedule for you to have regular tests and exams. Things you can do on your own to prevent diseases and keep yourself healthy. What should I know about diet, weight, and exercise? Eat a healthy diet  Eat a diet that includes plenty of vegetables, fruits, low-fat dairy products, and lean protein. Do not eat a lot of foods that are high in solid fats, added sugars, or sodium. Maintain a healthy weight Body mass index (BMI) is used to identify weight problems. It estimates body fat based on height and weight. Your health care provider can help determine your BMI and help you achieve or maintain a healthy weight. Get regular exercise Get regular exercise. This is one of the most important things you can do for your health. Most adults should: Exercise for at least 150 minutes each week. The exercise should increase your heart rate and make you sweat (moderate-intensity exercise). Do strengthening exercises at least twice a week. This is in addition to the moderate-intensity exercise. Spend less time sitting. Even light physical activity can be beneficial. Watch cholesterol and blood lipids Have your blood tested for lipids and cholesterol at 56 years of age, then have this test every 5 years. Have your cholesterol levels checked more often if: Your lipid or cholesterol levels are high. You are  older than 56 years of age. You are at high risk for heart disease. What should I know about cancer screening? Depending on your health history and family history, you may need to have cancer screening at various ages. This may include screening for: Breast cancer. Cervical cancer. Colorectal cancer. Skin cancer. Lung cancer. What should I know about heart disease, diabetes, and high blood pressure? Blood pressure and heart disease High blood pressure causes heart disease and increases the risk of stroke. This is more likely to develop in people who have high blood pressure readings or are overweight. Have your blood pressure checked: Every 3-5 years if you are 76-73 years of age. Every year if you are 12 years old or older. Diabetes Have regular diabetes screenings. This checks your fasting blood sugar level. Have the screening done: Once every three years after age 77 if you are at a normal weight and have a low risk for diabetes. More often and at a younger age if you are overweight or have a high risk for diabetes. What should I know about preventing infection? Hepatitis B If you have a higher risk for hepatitis B, you should be screened for this virus. Talk with your health care provider to find out if you are at risk for hepatitis B infection. Hepatitis C Testing is recommended for: Everyone born from [birth year] through 1964. Anyone with known risk factors for hepatitis C. Sexually transmitted infections (STIs) Get screened for STIs, including gonorrhea and chlamydia, if: You are sexually active and are  younger than 56 years of age. You are older than 56 years of age and your health care provider tells you that you are at risk for this type of infection. Your sexual activity has changed since you were last screened, and you are at increased risk for chlamydia or gonorrhea. Ask your health care provider if you are at risk. Ask your health care provider about whether you are at high risk  for HIV. Your health care provider may recommend a prescription medicine to help prevent HIV infection. If you choose to take medicine to prevent HIV, you should first get tested for HIV. You should then be tested every 3 months for as long as you are taking the medicine. Pregnancy If you are about to stop having your period (premenopausal) and you may become pregnant, seek counseling before you get pregnant. Take 400 to 800 micrograms (mcg) of folic acid every day if you become pregnant. Ask for birth control (contraception) if you want to prevent pregnancy. Osteoporosis and menopause Osteoporosis is a disease in which the bones lose minerals and strength with aging. This can result in bone fractures. If you are 61 years old or older, or if you are at risk for osteoporosis and fractures, ask your health care provider if you should: Be screened for bone loss. Take a calcium or vitamin D supplement to lower your risk of fractures. Be given hormone replacement therapy (HRT) to treat symptoms of menopause. Follow these instructions at home: Alcohol use Do not drink alcohol if: Your health care provider tells you not to drink. You are pregnant, may be pregnant, or are planning to become pregnant. If you drink alcohol: Limit how much you have to: 0-1 drink a day. Know how much alcohol is in your drink. In the [place] one drink equals one 12 oz bottle of beer (355 mL), one 5 oz glass of wine (148 mL), or one 1 oz glass of hard liquor (44 mL). Lifestyle Do not use any products that contain nicotine or tobacco. These products include cigarettes, chewing tobacco, and vaping devices, such as e-cigarettes. If you need help quitting, ask your health care provider. Do not use street drugs. Do not share needles. Ask your health care provider for help if you need support or information about quitting drugs. General instructions Schedule regular health, dental, and eye exams. Stay current with your  vaccines. Tell your health care provider if: You often feel depressed. You have ever been abused or do not feel safe at home. Summary Adopting a healthy lifestyle and getting preventive care are important in promoting health and wellness. Follow your health care provider's instructions about healthy diet, exercising, and getting tested or screened for diseases. Follow your health care provider's instructions on monitoring your cholesterol and blood pressure. This information is not intended to replace advice given to you by your health care provider. Make sure you discuss any questions you have with your health care provider. Document Revised: 10/31/2020 Document Reviewed: 10/31/2020 Elsevier Patient Education  2024 [employer].

## 2023-03-29 ENCOUNTER — Ambulatory Visit (INDEPENDENT_AMBULATORY_CARE_PROVIDER_SITE_OTHER): Payer: 59 | Admitting: Internal Medicine

## 2023-03-29 ENCOUNTER — Encounter: Payer: Self-pay | Admitting: Internal Medicine

## 2023-03-29 VITALS — BP 130/82 | HR 94 | Temp 98.2°F | Ht 65.0 in | Wt 351.0 lb

## 2023-03-29 DIAGNOSIS — F419 Anxiety disorder, unspecified: Secondary | ICD-10-CM | POA: Diagnosis not present

## 2023-03-29 DIAGNOSIS — E890 Postprocedural hypothyroidism: Secondary | ICD-10-CM

## 2023-03-29 DIAGNOSIS — E1165 Type 2 diabetes mellitus with hyperglycemia: Secondary | ICD-10-CM | POA: Diagnosis not present

## 2023-03-29 DIAGNOSIS — E782 Mixed hyperlipidemia: Secondary | ICD-10-CM | POA: Diagnosis not present

## 2023-03-29 DIAGNOSIS — I10 Essential (primary) hypertension: Secondary | ICD-10-CM | POA: Diagnosis not present

## 2023-03-29 DIAGNOSIS — Z23 Encounter for immunization: Secondary | ICD-10-CM

## 2023-03-29 DIAGNOSIS — Z0000 Encounter for general adult medical examination without abnormal findings: Secondary | ICD-10-CM | POA: Diagnosis not present

## 2023-03-29 DIAGNOSIS — G4733 Obstructive sleep apnea (adult) (pediatric): Secondary | ICD-10-CM | POA: Diagnosis not present

## 2023-03-29 DIAGNOSIS — Z7984 Long term (current) use of oral hypoglycemic drugs: Secondary | ICD-10-CM | POA: Diagnosis not present

## 2023-03-29 DIAGNOSIS — Z1211 Encounter for screening for malignant neoplasm of colon: Secondary | ICD-10-CM

## 2023-03-29 DIAGNOSIS — K219 Gastro-esophageal reflux disease without esophagitis: Secondary | ICD-10-CM

## 2023-03-29 LAB — CBC WITH DIFFERENTIAL/PLATELET
Basophils Absolute: 0.1 10*3/uL (ref 0.0–0.1)
Basophils Relative: 0.8 % (ref 0.0–3.0)
Eosinophils Absolute: 0.3 10*3/uL (ref 0.0–0.7)
Eosinophils Relative: 1.9 % (ref 0.0–5.0)
HCT: 39.8 % (ref 36.0–46.0)
Hemoglobin: 12.8 g/dL (ref 12.0–15.0)
Lymphocytes Relative: 39.8 % (ref 12.0–46.0)
Lymphs Abs: 5.6 10*3/uL — ABNORMAL HIGH (ref 0.7–4.0)
MCHC: 32.1 g/dL (ref 30.0–36.0)
MCV: 84.3 fL (ref 78.0–100.0)
Monocytes Absolute: 0.7 10*3/uL (ref 0.1–1.0)
Monocytes Relative: 5.1 % (ref 3.0–12.0)
Neutro Abs: 7.4 10*3/uL (ref 1.4–7.7)
Neutrophils Relative %: 52.4 % (ref 43.0–77.0)
Platelets: 327 10*3/uL (ref 150.0–400.0)
RBC: 4.72 Mil/uL (ref 3.87–5.11)
RDW: 14.8 % (ref 11.5–15.5)
WBC: 14.1 10*3/uL — ABNORMAL HIGH (ref 4.0–10.5)

## 2023-03-29 LAB — TSH: TSH: 0.16 u[IU]/mL — ABNORMAL LOW (ref 0.35–5.50)

## 2023-03-29 LAB — COMPREHENSIVE METABOLIC PANEL
ALT: 38 U/L — ABNORMAL HIGH (ref 0–35)
AST: 26 U/L (ref 0–37)
Albumin: 4.2 g/dL (ref 3.5–5.2)
Alkaline Phosphatase: 295 U/L — ABNORMAL HIGH (ref 39–117)
BUN: 8 mg/dL (ref 6–23)
CO2: 29 meq/L (ref 19–32)
Calcium: 9.9 mg/dL (ref 8.4–10.5)
Chloride: 105 meq/L (ref 96–112)
Creatinine, Ser: 0.81 mg/dL (ref 0.40–1.20)
GFR: 81 mL/min (ref >60.00)
Glucose, Bld: 230 mg/dL — ABNORMAL HIGH (ref 70–99)
Potassium: 3.8 meq/L (ref 3.5–5.1)
Sodium: 142 meq/L (ref 135–145)
Total Bilirubin: 0.6 mg/dL (ref 0.2–1.2)
Total Protein: 7.8 g/dL (ref 6.0–8.3)

## 2023-03-29 LAB — LIPID PANEL
Cholesterol: 157 mg/dL (ref 0–200)
HDL: 45.4 mg/dL (ref >39.00)
LDL Cholesterol: 83 mg/dL (ref 0–99)
NonHDL: 111.34
Total CHOL/HDL Ratio: 3
Triglycerides: 142 mg/dL (ref 0.0–149.0)
VLDL: 28.4 mg/dL (ref 0.0–40.0)

## 2023-03-29 LAB — T4, FREE: Free T4: 1.17 ng/dL (ref 0.60–1.60)

## 2023-03-29 LAB — HEMOGLOBIN A1C: Hgb A1c MFr Bld: 9.1 % — ABNORMAL HIGH (ref 4.6–6.5)

## 2023-03-29 MED ORDER — RYBELSUS 3 MG PO TABS: 3.0000 mg | ORAL_TABLET | Freq: Every day | ORAL | 0 refills | Status: DC

## 2023-03-29 NOTE — Assessment & Plan Note (Signed)
Chronic Management per Dr. Elvera Lennox TSH, free T4 today since she is getting blood work done

## 2023-03-29 NOTE — Assessment & Plan Note (Signed)
Chronic Using CPAP nightly Stressed weight loss

## 2023-03-29 NOTE — Assessment & Plan Note (Signed)
Chronic Blood pressure not well controlled CMP, CBC Continue diltiazem 120 mg daily, losartan to 100 mg daily

## 2023-03-29 NOTE — Assessment & Plan Note (Signed)
Chronic Regular exercise and healthy diet encouraged Check lipid panel  Continue Crestor 10 mg daily 

## 2023-03-29 NOTE — Assessment & Plan Note (Signed)
Chronic   Lab Results  Component Value Date   HGBA1C 9.6 (H) 12/26/2022   Sugars not ideally controlled by A1c when it was last checked Check A1c Had to discontinue Trulicity because of diarrhea continue Jardiance 10 mg daily-this is causing some yeast infections, but hopefully we can treat that successfully so that we continue the medication Start Rybelsus 3 mg daily Start Jardiance 10 mg daily-believe this is covered Stressed regular exercise, diabetic diet Continue working on weight loss

## 2023-03-29 NOTE — Assessment & Plan Note (Signed)
Chronic GERD not controlled for the last couple of days Continue famotidine 20 mg twice daily-advised to take regularly until its controlled and then decrease medication

## 2023-03-29 NOTE — Assessment & Plan Note (Signed)
Chronic Controlled, but does have high stress Continue alprazolam 0.5 mg twice daily as needed-she does not take often.

## 2023-03-30 DIAGNOSIS — G4733 Obstructive sleep apnea (adult) (pediatric): Secondary | ICD-10-CM | POA: Diagnosis not present

## 2023-03-30 DIAGNOSIS — G4731 Primary central sleep apnea: Secondary | ICD-10-CM | POA: Diagnosis not present

## 2023-04-01 ENCOUNTER — Encounter: Payer: Self-pay | Admitting: Internal Medicine

## 2023-04-03 NOTE — Progress Notes (Signed)
GYNECOLOGY  VISIT   HPI: 57 y.o.   Married  [place]  female   G7P0 with No LMP recorded. (Menstrual status: IUD).   here for   IUD removal.  She used Cytotec.   FSH 30 07/03/23.  FSH 30.1 10/05/20.  FSH 35.5 11/13/18.   Her mother has dementia and is transitioning.  She is returning to a nursing home for care.   GYNECOLOGIC HISTORY: No LMP recorded. (Menstrual status: IUD). Contraception:  IUD-2015/2016 Menopausal hormone therapy:  n/a Last mammogram:  11/16/20 Breast Density Cat A, BI-RADS CAT 1 neg  Last pap smear:   05/02/18 neg: HR HPV neg         OB History     Gravida  7   Para      Term      Preterm      AB      Living  7      SAB      IAB      Ectopic      Multiple      Live Births                 Patient Active Problem List   Diagnosis Date Noted   Chronic left hip pain 10/04/2020   De Quervain's tenosynovitis, right 06/16/2019   Patellofemoral arthritis of left knee 11/28/2018   Left knee pain 11/24/2018   Episodic lightheadedness 10/24/2018   Lumbar back pain with radiculopathy affecting left lower extremity 10/07/2018   Right ovarian cyst    OSA (obstructive sleep apnea), severe 06/23/2018   Anxiety 03/05/2018   Chronic nonintractable headache 03/05/2018   Hyperlipidemia 01/23/2018   Diabetes (HCC) 01/16/2018   DKA (diabetic ketoacidosis) (HCC) 01/15/2018   Gastroesophageal reflux disease 10/05/2017   Temporal arteritis (HCC) 10/03/2017   Hypertension 09/28/2015   Morbid obesity (HCC) 05/17/2015   Umbilical hernia 05/17/2015   Palpitations 05/17/2015   Iron deficiency anemia 09/03/2007   Hypothyroidism following radioiodine therapy 09/02/2007   INTERSTITIAL CYSTITIS 09/01/2007   ALLERGIC RHINITIS 08/07/2007   DISC DISEASE, LUMBAR 08/07/2007    Past Medical History:  Diagnosis Date   Abnormal uterine bleeding    Anemia    COVID 12/2018   Depression    Diabetes mellitus without complication (HCC)    gestational    Elevated blood-pressure reading without diagnosis of hypertension    Endometriosis    Fibroids    GERD (gastroesophageal reflux disease)    Graves disease    Grief at loss of child    H/O gestational diabetes mellitus, not currently pregnant 09/28/2015   Hypertension    Hypothyroidism    Interstitial cystitis    Menorrhagia    Oropharyngeal candidiasis 01/16/2018   OSA (obstructive sleep apnea), severe 06/23/2018   Premature ventricular contractions    a. rare PVC by monitor 12/2016.   Right ovarian cyst    3 cm.   Thyroid disease    Uterine leiomyoma     Past Surgical History:  Procedure Laterality Date   APPENDECTOMY     ARTERY BIOPSY Left 10/25/2017   Procedure: BIOPSY TEMPORAL ARTERY;  Surgeon: Franky Macho, MD;  Location: [facility];  Service: General;  Laterality: Left;   CESAREAN SECTION     CHOLECYSTECTOMY     HERNIA REPAIR     incisional   TUMOR REMOVAL  fibroids    Current Outpatient Medications  Medication Sig Dispense Refill   ALPRAZolam (XANAX) 0.5 MG tablet Take 1 tablet (0.5 mg  total) by mouth 2 (two) times daily as needed for anxiety. 60 tablet 0   Blood Glucose Monitoring Suppl (FREESTYLE LITE) w/Device KIT Use as directed. 1 kit 0   cholecalciferol (VITAMIN D) 1000 units tablet Take 1,000 Units by mouth daily.     clotrimazole (LOTRIMIN) 1 % cream Apply 1 application topically 2 (two) times daily. Use for 2 weeks at a time as needed. 30 g 2   diltiazem (TIAZAC) 120 MG 24 hr capsule Take 1 capsule (120 mg total) by mouth daily. 90 capsule 3   diphenoxylate-atropine (LOMOTIL) 2.5-0.025 MG tablet Take 2 tablets by mouth 4 (four) times daily as needed for diarrhea or loose stools. 20 tablet 0   docusate sodium (COLACE) 100 MG capsule Take 1-3 capsules (100-300 mg total) by mouth daily as needed for mild constipation. 90 capsule 5   empagliflozin (JARDIANCE) 10 MG TABS tablet Take 1 tablet (10 mg total) by mouth daily before breakfast. 30 tablet 5   famotidine (PEPCID)  20 MG tablet Take 1 tablet (20 mg total) by mouth 2 (two) times daily. 180 tablet 3   fluconazole (DIFLUCAN) 150 MG tablet Take one tablet (150 mg) by mouth every 3 days for 3 doses.  Then take one tablet by mouth weekly. 15 tablet 0   glucose blood (FREESTYLE LITE) test strip Use as instructed 100 each 12   Lancets (FREESTYLE) lancets Use as instructed 100 each 12   levonorgestrel (MIRENA) 20 MCG/24HR IUD 1 each by Intrauterine route once.      losartan (COZAAR) 100 MG tablet Take 1 tablet (100 mg total) by mouth daily. 90 tablet 3   meloxicam (MOBIC) 15 MG tablet Take 1 tablet (15 mg total) by mouth daily. 30 tablet 3   methocarbamol (ROBAXIN) 750 MG tablet Take 1 tablet (750 mg total) by mouth 4 (four) times daily. 120 tablet 2   misoprostol (CYTOTEC) 200 MCG tablet Place one table (200 mcg) in the vagina the night before IUD removal and then place one tablet in the vagina the morning of the IUD removal. 2 tablet 0   Multiple Vitamin (MULTIVITAMIN) tablet Take 1 tablet by mouth daily.     nystatin (MYCOSTATIN/NYSTOP) powder Apply 1 Application topically 3 (three) times daily. Apply to affected area for up to 7 days 30 g 2   rosuvastatin (CRESTOR) 10 MG tablet Take 1 tablet (10 mg total) by mouth daily. 90 tablet 3   Semaglutide (RYBELSUS) 3 MG TABS Take 1 tablet (3 mg total) by mouth daily. 30 tablet 0   UNITHROID 137 MCG tablet Take 1 tablet (137 mcg total) by mouth daily before breakfast. 45 tablet 6   UNITHROID 150 MCG tablet Take by mouth.     No current facility-administered medications for this visit.     ALLERGIES: Prednisone, Trulicity [dulaglutide], Fluoxetine, Glimepiride, Prozac [fluoxetine hcl], Hydrocodone, and Metformin and related  Family History  Problem Relation Age of Onset   Hypertension Mother    Cancer Mother    Breast cancer Mother 1   Hypertension Father    Diabetes Father    Cancer Father    Ovarian cancer Maternal Aunt    Thyroid disease Maternal Aunt         hypothyroid    Social History   Socioeconomic History   Marital status: Married    Spouse name: Not on file   Number of children: Not on file   Years of education: Not on file   Highest education level: Not  on file  Occupational History   Not on file  Tobacco Use   Smoking status: Never   Smokeless tobacco: Never  Vaping Use   Vaping status: Never Used  Substance and Sexual Activity   Alcohol use: No   Drug use: No   Sexual activity: Never    Birth control/protection: I.U.D.    Comment: Mirena inserted 2015/2016  Other Topics Concern   Not on file  Social History Narrative   Caffeine: maybe 4 cups/day (tea/coffee)   Social Determinants of Health   Financial Resource Strain: Not on file  Food Insecurity: Not on file  Transportation Needs: Not on file  Physical Activity: Not on file  Stress: Not on file  Social Connections: Not on file  Intimate Partner Violence: Not on file    Review of Systems  All other systems reviewed and are negative.   PHYSICAL EXAMINATION:    BP 130/82 (BP Location: Right Arm, Patient Position: Sitting, Cuff Size: Large)   Ht 5\' 5"  (1.651 m)   Wt (!) 351 lb (159.2 kg)   BMI 58.41 kg/m     General appearance: alert, cooperative and appears stated age   Pelvic: External genitalia:  no lesions              Urethra:  normal appearing urethra with no masses, tenderness or lesions              Bartholins and Skenes: normal                 Vagina: normal appearing vagina with normal color and discharge, no lesions              Cervix: no lesions.  IUD threads seen.                 Bimanual Exam:  Uterus:  normal size, contour, position, consistency, mobility, non-tender              Adnexa: no mass, fullness, tenderness     IUD removal Consent done.  Cytotec removed from the vagina.  Ring forceps used to grasp IUD threads and IUD removed intact, shown to the patient and discarded.  No complication.  No EBL.   Chaperone was present  for exam:  Warren Lacy, CMA  ASSESSMENT  Encounter for IUD removal.    PLAN  Patient will call for any future vaginal bleeding.  She will schedule her mammogram.  She will keep her appointment for her annual exam in January.

## 2023-04-12 ENCOUNTER — Telehealth: Payer: Self-pay

## 2023-04-12 ENCOUNTER — Other Ambulatory Visit (HOSPITAL_COMMUNITY): Payer: Self-pay

## 2023-04-12 ENCOUNTER — Encounter: Payer: Self-pay | Admitting: Internal Medicine

## 2023-04-12 NOTE — Telephone Encounter (Signed)
Patient needs a PA on Unithroid.

## 2023-04-15 NOTE — Telephone Encounter (Signed)
J, Can you please let the patient know about her insurance requirements?  If she does not want to try the generic levothyroxine (which I would advise her to do since the generics are much better nowadays than in the past), we can direct her to try Synthroid brand name from [facility] mail order pharmacy.

## 2023-04-15 NOTE — Telephone Encounter (Signed)
Please advise,

## 2023-04-17 ENCOUNTER — Encounter: Payer: Self-pay | Admitting: Obstetrics and Gynecology

## 2023-04-17 ENCOUNTER — Ambulatory Visit (INDEPENDENT_AMBULATORY_CARE_PROVIDER_SITE_OTHER): Payer: 59 | Admitting: Obstetrics and Gynecology

## 2023-04-17 VITALS — BP 130/82 | Ht 65.0 in | Wt 351.0 lb

## 2023-04-17 DIAGNOSIS — Z30432 Encounter for removal of intrauterine contraceptive device: Secondary | ICD-10-CM | POA: Diagnosis not present

## 2023-04-22 ENCOUNTER — Other Ambulatory Visit: Payer: Self-pay | Admitting: Internal Medicine

## 2023-04-22 ENCOUNTER — Other Ambulatory Visit (HOSPITAL_COMMUNITY): Payer: Self-pay

## 2023-04-22 ENCOUNTER — Other Ambulatory Visit: Payer: Self-pay

## 2023-04-22 MED ORDER — MELOXICAM 15 MG PO TABS
15.0000 mg | ORAL_TABLET | Freq: Every day | ORAL | 3 refills | Status: DC
  Filled 2023-04-22: qty 30, 30d supply, fill #0
  Filled 2023-05-20: qty 30, 30d supply, fill #1
  Filled 2023-06-23: qty 30, 30d supply, fill #2
  Filled 2023-07-25: qty 30, 30d supply, fill #3

## 2023-04-22 MED ORDER — RYBELSUS 3 MG PO TABS
3.0000 mg | ORAL_TABLET | Freq: Every day | ORAL | 0 refills | Status: DC
  Filled 2023-04-22 – 2023-05-06 (×2): qty 30, 30d supply, fill #0

## 2023-04-29 DIAGNOSIS — G4733 Obstructive sleep apnea (adult) (pediatric): Secondary | ICD-10-CM | POA: Diagnosis not present

## 2023-04-29 DIAGNOSIS — G4731 Primary central sleep apnea: Secondary | ICD-10-CM | POA: Diagnosis not present

## 2023-05-02 DIAGNOSIS — M170 Bilateral primary osteoarthritis of knee: Secondary | ICD-10-CM | POA: Diagnosis not present

## 2023-05-03 ENCOUNTER — Encounter: Payer: Self-pay | Admitting: Internal Medicine

## 2023-05-06 ENCOUNTER — Other Ambulatory Visit (HOSPITAL_COMMUNITY): Payer: Self-pay

## 2023-05-06 MED ORDER — RYBELSUS 7 MG PO TABS: 7.0000 mg | ORAL_TABLET | Freq: Every day | ORAL | Status: DC

## 2023-05-20 ENCOUNTER — Other Ambulatory Visit: Payer: Self-pay | Admitting: Obstetrics and Gynecology

## 2023-05-20 ENCOUNTER — Other Ambulatory Visit: Payer: Self-pay | Admitting: Internal Medicine

## 2023-05-20 MED ORDER — RYBELSUS 7 MG PO TABS: 7.0000 mg | ORAL_TABLET | Freq: Every day | ORAL | Status: DC

## 2023-05-21 ENCOUNTER — Other Ambulatory Visit (HOSPITAL_COMMUNITY): Payer: Self-pay

## 2023-05-21 ENCOUNTER — Other Ambulatory Visit: Payer: Self-pay | Admitting: Internal Medicine

## 2023-05-21 ENCOUNTER — Other Ambulatory Visit: Payer: Self-pay

## 2023-05-21 MED ORDER — RYBELSUS 7 MG PO TABS
7.0000 mg | ORAL_TABLET | Freq: Every day | ORAL | 3 refills | Status: DC
  Filled 2023-05-21 (×4): qty 30, 30d supply, fill #0
  Filled 2023-06-25: qty 30, 30d supply, fill #1

## 2023-05-21 NOTE — Telephone Encounter (Signed)
Med refill request: fluconazole (Diflucan) 150 mg Last AEX: 10/05/20 Next AEX: 07/22/23 Last MMG (if hormonal med) Refill authorized: Last Rx was sent #15 with zero refills on 03/27/23. Please approve or deny as appropriate.

## 2023-05-27 ENCOUNTER — Encounter: Payer: Self-pay | Admitting: Obstetrics and Gynecology

## 2023-05-29 DIAGNOSIS — G4731 Primary central sleep apnea: Secondary | ICD-10-CM | POA: Diagnosis not present

## 2023-05-29 DIAGNOSIS — G4733 Obstructive sleep apnea (adult) (pediatric): Secondary | ICD-10-CM | POA: Diagnosis not present

## 2023-06-23 ENCOUNTER — Other Ambulatory Visit: Payer: Self-pay | Admitting: Internal Medicine

## 2023-06-23 ENCOUNTER — Other Ambulatory Visit (HOSPITAL_COMMUNITY): Payer: Self-pay

## 2023-06-24 ENCOUNTER — Other Ambulatory Visit (HOSPITAL_COMMUNITY): Payer: Self-pay

## 2023-06-24 ENCOUNTER — Other Ambulatory Visit: Payer: Self-pay

## 2023-06-24 MED ORDER — EMPAGLIFLOZIN 10 MG PO TABS
10.0000 mg | ORAL_TABLET | Freq: Every day | ORAL | 5 refills | Status: DC
  Filled 2023-06-24: qty 30, 30d supply, fill #0

## 2023-06-24 MED ORDER — METHOCARBAMOL 750 MG PO TABS
750.0000 mg | ORAL_TABLET | Freq: Four times a day (QID) | ORAL | 2 refills | Status: DC
  Filled 2023-06-24: qty 120, 30d supply, fill #0
  Filled 2023-09-02 – 2023-09-20 (×2): qty 120, 30d supply, fill #1
  Filled 2023-11-15 (×2): qty 120, 30d supply, fill #2

## 2023-06-25 ENCOUNTER — Other Ambulatory Visit (HOSPITAL_COMMUNITY): Payer: Self-pay

## 2023-06-29 DIAGNOSIS — G4733 Obstructive sleep apnea (adult) (pediatric): Secondary | ICD-10-CM | POA: Diagnosis not present

## 2023-06-29 DIAGNOSIS — G4731 Primary central sleep apnea: Secondary | ICD-10-CM | POA: Diagnosis not present

## 2023-06-30 ENCOUNTER — Encounter: Payer: Self-pay | Admitting: Internal Medicine

## 2023-06-30 NOTE — Patient Instructions (Addendum)
      Blood work was ordered.       Medications changes include :   None     Return in about 3 months (around 10/01/2023) for follow up.

## 2023-06-30 NOTE — Progress Notes (Signed)
 Subjective:    Patient ID: Erin [name], female    DOB: 27-Jan-1966, 58 y.o.   MRN: [id number]     HPI Erin [name] is here for follow up of her chronic medical problems.  Mom died in May 12, 2024.  Boss's son died suddenly - she just found that out.   Still got night sweats and squeezing in head/headaches.  It feels better in the morning.  Only bothers her when she lays down.   ? Related to cpap  Medications and allergies reviewed with patient and updated if appropriate.  Current Outpatient Medications on File Prior to Visit  Medication Sig Dispense Refill   ALPRAZolam  (XANAX ) 0.5 MG tablet Take 1 tablet (0.5 mg total) by mouth 2 (two) times daily as needed for anxiety. 60 tablet 0   Blood Glucose Monitoring Suppl (FREESTYLE LITE) w/Device KIT Use as directed. 1 kit 0   cholecalciferol (VITAMIN D) 1000 units tablet Take 1,000 Units by mouth daily.     clotrimazole  (LOTRIMIN ) 1 % cream Apply 1 application topically 2 (two) times daily. Use for 2 weeks at a time as needed. 30 g 2   diltiazem  (TIAZAC ) 120 MG 24 hr capsule Take 1 capsule (120 mg total) by mouth daily. 90 capsule 3   docusate sodium  (COLACE) 100 MG capsule Take 1-3 capsules (100-300 mg total) by mouth daily as needed for mild constipation. 90 capsule 5   empagliflozin  (JARDIANCE ) 10 MG TABS tablet Take 1 tablet (10 mg total) by mouth daily before breakfast. 30 tablet 5   famotidine  (PEPCID ) 20 MG tablet Take 1 tablet (20 mg total) by mouth 2 (two) times daily. 180 tablet 3   glucose blood (FREESTYLE LITE) test strip Use as instructed 100 each 12   Lancets (FREESTYLE) lancets Use as instructed 100 each 12   losartan  (COZAAR ) 100 MG tablet Take 1 tablet (100 mg total) by mouth daily. 90 tablet 3   meloxicam  (MOBIC ) 15 MG tablet Take 1 tablet (15 mg total) by mouth daily. 30 tablet 3   methocarbamol  (ROBAXIN ) 750 MG tablet Take 1 tablet (750 mg total) by mouth 4 (four) times daily. 120 tablet 2   Multiple Vitamin  (MULTIVITAMIN) tablet Take 1 tablet by mouth daily.     nystatin  (MYCOSTATIN /NYSTOP ) powder Apply 1 Application topically 3 (three) times daily. Apply to affected area for up to 7 days 30 g 2   rosuvastatin  (CRESTOR ) 10 MG tablet Take 1 tablet (10 mg total) by mouth daily. 90 tablet 3   UNITHROID  137 MCG tablet Take 1 tablet (137 mcg total) by mouth daily before breakfast. 45 tablet 6   No current facility-administered medications on file prior to visit.     Review of Systems  Constitutional:  Negative for fever.  Respiratory:  Negative for cough, shortness of breath and wheezing.   Cardiovascular:  Positive for palpitations. Negative for chest pain and leg swelling.  Musculoskeletal:  Positive for arthralgias (kness) and back pain (controlled).  Neurological:  Positive for headaches (at night - pressure, squeezing). Negative for dizziness and light-headedness.       Objective:   Vitals:   07/03/23 1523  BP: (!) 148/98  Pulse: 86  Temp: 98.4 F (36.9 C)  SpO2: 98%   BP Readings from Last 3 Encounters:  07/03/23 (!) 148/98  04/17/23 130/82  03/29/23 130/82   Wt Readings from Last 3 Encounters:  07/03/23 (!) 340 lb (154.2 kg)  04/17/23 (!) 351 lb (159.2 kg)  03/29/23 ROLLEN)  351 lb (159.2 kg)   Body mass index is 56.58 kg/m.    Physical Exam Constitutional:      General: She is not in acute distress.    Appearance: Normal appearance.  HENT:     Head: Normocephalic and atraumatic.  Eyes:     Conjunctiva/sclera: Conjunctivae normal.  Cardiovascular:     Rate and Rhythm: Normal rate and regular rhythm.     Heart sounds: Normal heart sounds.  Pulmonary:     Effort: Pulmonary effort is normal. No respiratory distress.     Breath sounds: Normal breath sounds. No wheezing.  Musculoskeletal:     Cervical back: Neck supple.     Right lower leg: No edema.     Left lower leg: No edema.  Lymphadenopathy:     Cervical: No cervical adenopathy.  Skin:    General: Skin is  warm and dry.     Findings: No rash.  Neurological:     Mental Status: She is alert. Mental status is at baseline.  Psychiatric:        Mood and Affect: Mood normal.        Behavior: Behavior normal.        Lab Results  Component Value Date   WBC 14.1 (H) 03/29/2023   HGB 12.8 03/29/2023   HCT 39.8 03/29/2023   PLT 327.0 03/29/2023   GLUCOSE 230 (H) 03/29/2023   CHOL 157 03/29/2023   TRIG 142.0 03/29/2023   HDL 45.40 03/29/2023   LDLCALC 83 03/29/2023   ALT 38 (H) 03/29/2023   AST 26 03/29/2023   NA 142 03/29/2023   K 3.8 03/29/2023   CL 105 03/29/2023   CREATININE 0.81 03/29/2023   BUN 8 03/29/2023   CO2 29 03/29/2023   TSH 0.16 (L) 03/29/2023   HGBA1C 9.1 (H) 03/29/2023   MICROALBUR 2.5 (H) 12/26/2022     Assessment & Plan:    See Problem List for Assessment and Plan of chronic medical problems.

## 2023-07-03 ENCOUNTER — Other Ambulatory Visit (HOSPITAL_COMMUNITY): Payer: Self-pay

## 2023-07-03 ENCOUNTER — Ambulatory Visit: Payer: 59 | Admitting: Internal Medicine

## 2023-07-03 ENCOUNTER — Other Ambulatory Visit: Payer: Self-pay | Admitting: Internal Medicine

## 2023-07-03 VITALS — BP 138/80 | HR 86 | Temp 98.4°F | Ht 65.0 in | Wt 340.0 lb

## 2023-07-03 DIAGNOSIS — M5416 Radiculopathy, lumbar region: Secondary | ICD-10-CM

## 2023-07-03 DIAGNOSIS — E782 Mixed hyperlipidemia: Secondary | ICD-10-CM

## 2023-07-03 DIAGNOSIS — E1165 Type 2 diabetes mellitus with hyperglycemia: Secondary | ICD-10-CM | POA: Diagnosis not present

## 2023-07-03 DIAGNOSIS — I10 Essential (primary) hypertension: Secondary | ICD-10-CM | POA: Diagnosis not present

## 2023-07-03 DIAGNOSIS — K219 Gastro-esophageal reflux disease without esophagitis: Secondary | ICD-10-CM

## 2023-07-03 DIAGNOSIS — Z7984 Long term (current) use of oral hypoglycemic drugs: Secondary | ICD-10-CM

## 2023-07-03 DIAGNOSIS — E890 Postprocedural hypothyroidism: Secondary | ICD-10-CM

## 2023-07-03 DIAGNOSIS — F419 Anxiety disorder, unspecified: Secondary | ICD-10-CM | POA: Diagnosis not present

## 2023-07-03 LAB — CBC WITH DIFFERENTIAL/PLATELET
Basophils Absolute: 0.1 10*3/uL (ref 0.0–0.1)
Basophils Relative: 1 % (ref 0.0–3.0)
Eosinophils Absolute: 0.1 10*3/uL (ref 0.0–0.7)
Eosinophils Relative: 0.7 % (ref 0.0–5.0)
HCT: 40 % (ref 36.0–46.0)
Hemoglobin: 12.8 g/dL (ref 12.0–15.0)
Lymphocytes Relative: 38.5 % (ref 12.0–46.0)
Lymphs Abs: 5.6 10*3/uL — ABNORMAL HIGH (ref 0.7–4.0)
MCHC: 32.1 g/dL (ref 30.0–36.0)
MCV: 84.8 fL (ref 78.0–100.0)
Monocytes Absolute: 0.7 10*3/uL (ref 0.1–1.0)
Monocytes Relative: 4.7 % (ref 3.0–12.0)
Neutro Abs: 8 10*3/uL — ABNORMAL HIGH (ref 1.4–7.7)
Neutrophils Relative %: 55.1 % (ref 43.0–77.0)
Platelets: 346 10*3/uL (ref 150.0–400.0)
RBC: 4.72 Mil/uL (ref 3.87–5.11)
RDW: 15 % (ref 11.5–15.5)
WBC: 14.5 10*3/uL — ABNORMAL HIGH (ref 4.0–10.5)

## 2023-07-03 LAB — LIPID PANEL
Cholesterol: 132 mg/dL (ref 0–200)
HDL: 41.3 mg/dL (ref >39.00)
LDL Cholesterol: 73 mg/dL (ref 0–99)
NonHDL: 90.48
Total CHOL/HDL Ratio: 3
Triglycerides: 89 mg/dL (ref 0.0–149.0)
VLDL: 17.8 mg/dL (ref 0.0–40.0)

## 2023-07-03 LAB — COMPREHENSIVE METABOLIC PANEL
ALT: 20 U/L (ref 0–35)
AST: 17 U/L (ref 0–37)
Albumin: 4.5 g/dL (ref 3.5–5.2)
Alkaline Phosphatase: 232 U/L — ABNORMAL HIGH (ref 39–117)
BUN: 13 mg/dL (ref 6–23)
CO2: 27 meq/L (ref 19–32)
Calcium: 9.7 mg/dL (ref 8.4–10.5)
Chloride: 106 meq/L (ref 96–112)
Creatinine, Ser: 0.76 mg/dL (ref 0.40–1.20)
GFR: 87.27 mL/min (ref >60.00)
Glucose, Bld: 101 mg/dL — ABNORMAL HIGH (ref 70–99)
Potassium: 3.9 meq/L (ref 3.5–5.1)
Sodium: 141 meq/L (ref 135–145)
Total Bilirubin: 0.6 mg/dL (ref 0.2–1.2)
Total Protein: 8 g/dL (ref 6.0–8.3)

## 2023-07-03 LAB — T4, FREE: Free T4: 1.11 ng/dL (ref 0.60–1.60)

## 2023-07-03 LAB — TSH: TSH: 0.23 u[IU]/mL — ABNORMAL LOW (ref 0.35–5.50)

## 2023-07-03 LAB — HEMOGLOBIN A1C: Hgb A1c MFr Bld: 8 % — ABNORMAL HIGH (ref 4.6–6.5)

## 2023-07-03 MED ORDER — RYBELSUS 7 MG PO TABS
7.0000 mg | ORAL_TABLET | Freq: Every day | ORAL | 5 refills | Status: DC
  Filled 2023-07-10 (×2): qty 30, 30d supply, fill #0
  Filled 2023-07-31 – 2023-08-16 (×3): qty 30, 30d supply, fill #1
  Filled 2023-11-06: qty 30, 30d supply, fill #2
  Filled 2024-01-09: qty 30, 30d supply, fill #3
  Filled 2024-02-17: qty 30, 30d supply, fill #4
  Filled 2024-04-06: qty 30, 30d supply, fill #5

## 2023-07-03 NOTE — Assessment & Plan Note (Signed)
Chronic Regular exercise and healthy diet encouraged Check lipid panel  Continue Crestor 10 mg daily 

## 2023-07-03 NOTE — Assessment & Plan Note (Signed)
Chronic Blood pressure not well controlled CMP, CBC Continue diltiazem 120 mg daily, losartan to 100 mg daily

## 2023-07-03 NOTE — Assessment & Plan Note (Signed)
 Chronic Management per Dr. Elvera Lennox TSH, free T4 today to be sent to Dr Elvera Lennox - requested by patient

## 2023-07-03 NOTE — Assessment & Plan Note (Signed)
Chronic Controlled Continue famotidine 20 mg twice daily

## 2023-07-03 NOTE — Assessment & Plan Note (Signed)
 Chronic  Lab Results  Component Value Date   HGBA1C 8.0 (H) 07/03/2023   Sugars not ideally controlled by A1c when it was last checked Check A1c Continue Rybelsus   3.5 mg daily -cutting 7 mg in half and tolerating it-did not tolerate 7 mg Continue Jardiance  10 mg daily-depending on A1c may need to increase this Stressed regular exercise, diabetic diet Continue working on weight loss

## 2023-07-03 NOTE — Assessment & Plan Note (Signed)
Chronic Intermittent symptoms Continue meloxicam 15 mg daily , methocarbamol 750 mg every 6 hours as needed - taking daily Encouraged weight loss

## 2023-07-03 NOTE — Assessment & Plan Note (Signed)
 Chronic Controlled, but does have high stress still Continue alprazolam 0.5 mg twice daily as needed-she does not take often.

## 2023-07-04 ENCOUNTER — Other Ambulatory Visit: Payer: Self-pay | Admitting: Internal Medicine

## 2023-07-04 DIAGNOSIS — E890 Postprocedural hypothyroidism: Secondary | ICD-10-CM

## 2023-07-05 ENCOUNTER — Other Ambulatory Visit: Payer: Self-pay | Admitting: Internal Medicine

## 2023-07-05 DIAGNOSIS — E890 Postprocedural hypothyroidism: Secondary | ICD-10-CM

## 2023-07-06 ENCOUNTER — Encounter: Payer: Self-pay | Admitting: Internal Medicine

## 2023-07-08 ENCOUNTER — Other Ambulatory Visit (HOSPITAL_COMMUNITY): Payer: Self-pay

## 2023-07-08 ENCOUNTER — Encounter: Payer: Self-pay | Admitting: Obstetrics and Gynecology

## 2023-07-08 MED ORDER — EMPAGLIFLOZIN 25 MG PO TABS
25.0000 mg | ORAL_TABLET | Freq: Every day | ORAL | 5 refills | Status: DC
  Filled 2023-07-08: qty 30, 30d supply, fill #0
  Filled 2023-07-31 – 2023-08-16 (×3): qty 30, 30d supply, fill #1
  Filled 2023-09-18: qty 30, 30d supply, fill #2
  Filled 2023-10-18: qty 30, 30d supply, fill #3
  Filled 2023-11-15 (×2): qty 30, 30d supply, fill #4
  Filled 2023-12-19: qty 30, 30d supply, fill #5

## 2023-07-08 MED ORDER — UNITHROID 137 MCG PO TABS: 137.0000 ug | ORAL_TABLET | Freq: Every day | ORAL | 6 refills | Status: DC

## 2023-07-08 NOTE — Progress Notes (Unsigned)
58 y.o. G24P0 Married [place] American female here for annual exam.    IUD removed 04/17/23.   Patient is also followed for a right ovarian cyst and a left hydrosalpinx.  Last US done 11/13/18. Had a CT of abdomen and pelvis 07/13/22 and had normal pelvic anatomy.  Her IUD was in place at that time.   On Jardiance for diabetes.  A1C 8.0 on 07/03/23.  Shares her birthday tomorrow with her daughter who passed away.   Her mother passed away in 05-02-24.  Father has kidney failure and is on dialysis.   PCP: Pincus Sanes, MD   No LMP recorded (lmp unknown). Patient is postmenopausal.           Sexually active: No.  The current method of family planning is post menopausal status.    Menopausal hormone therapy:  n/a Exercising: Yes.     walking Smoker:  no  OB History  Gravida Para Term Preterm AB Living  7     7  SAB IAB Ectopic Multiple Live Births          # Outcome Date GA Lbr Len/2nd Weight Sex Type Anes PTL Lv  7 Gravida           6 Gravida           5 Gravida           4 Gravida           3 Gravida           2 Gravida           1 Gravida              HEALTH MAINTENANCE: Last 2 paps:  05/02/18 neg: HR HPV neg, 09/04/07 neg History of abnormal Pap or positive HPV:  no Mammogram:   11/16/20 Breast Density Cat A, BI-RADS CAT 1 neg Colonoscopy:  n/a Bone Density:  n/a  Result  n/a   Immunization History  Administered Date(s) Administered   Influenza, Seasonal, Injecte, Preservative Fre 03/29/2023   Influenza,inj,Quad PF,6+ Mos 08/29/2018, 06/08/2020, 05/25/2022   Janssen (J&J) SARS-COV-2 Vaccination 10/01/2019   Td 06/25/2004      reports that she has never smoked. She has never used smokeless tobacco. She reports that she does not drink alcohol and does not use drugs.  Past Medical History:  Diagnosis Date   Abnormal uterine bleeding    Anemia    COVID 12/2018   Depression    Diabetes mellitus without complication (HCC)    gestational   Elevated  blood-pressure reading without diagnosis of hypertension    Endometriosis    Fibroids    GERD (gastroesophageal reflux disease)    Graves disease    Grief at loss of child    H/O gestational diabetes mellitus, not currently pregnant 09/28/2015   Hypertension    Hypothyroidism    Interstitial cystitis    Menorrhagia    Oropharyngeal candidiasis 01/16/2018   OSA (obstructive sleep apnea), severe 06/23/2018   Premature ventricular contractions    a. rare PVC by monitor 12/2016.   Right ovarian cyst    3 cm.   Thyroid disease    Uterine leiomyoma     Past Surgical History:  Procedure Laterality Date   APPENDECTOMY     ARTERY BIOPSY Left 10/25/2017   Procedure: BIOPSY TEMPORAL ARTERY;  Surgeon: Franky Macho, MD;  Location: [facility];  Service: General;  Laterality: Left;   CESAREAN SECTION  CHOLECYSTECTOMY     HERNIA REPAIR     incisional   TUMOR REMOVAL  fibroids    Current Outpatient Medications  Medication Sig Dispense Refill   ALPRAZolam (XANAX) 0.5 MG tablet Take 1 tablet (0.5 mg total) by mouth 2 (two) times daily as needed for anxiety. 60 tablet 0   Blood Glucose Monitoring Suppl (FREESTYLE LITE) w/Device KIT Use as directed. 1 kit 0   cholecalciferol (VITAMIN D) 1000 units tablet Take 1,000 Units by mouth daily.     clotrimazole (LOTRIMIN) 1 % cream Apply 1 application topically 2 (two) times daily. Use for 2 weeks at a time as needed. 30 g 2   diltiazem (TIAZAC) 120 MG 24 hr capsule Take 1 capsule (120 mg total) by mouth daily. 90 capsule 3   docusate sodium (COLACE) 100 MG capsule Take 1-3 capsules (100-300 mg total) by mouth daily as needed for mild constipation. 90 capsule 5   empagliflozin (JARDIANCE) 25 MG TABS tablet Take 1 tablet (25 mg total) by mouth daily before breakfast. 30 tablet 5   famotidine (PEPCID) 20 MG tablet Take 1 tablet (20 mg total) by mouth 2 (two) times daily. 180 tablet 3   glucose blood (FREESTYLE LITE) test strip Use as instructed 100 each 12    Lancets (FREESTYLE) lancets Use as instructed 100 each 12   losartan (COZAAR) 100 MG tablet Take 1 tablet (100 mg total) by mouth daily. 90 tablet 3   meloxicam (MOBIC) 15 MG tablet Take 1 tablet (15 mg total) by mouth daily. 30 tablet 3   methocarbamol (ROBAXIN) 750 MG tablet Take 1 tablet (750 mg total) by mouth 4 (four) times daily. 120 tablet 2   Multiple Vitamin (MULTIVITAMIN) tablet Take 1 tablet by mouth daily.     rosuvastatin (CRESTOR) 10 MG tablet Take 1 tablet (10 mg total) by mouth daily. 90 tablet 3   Semaglutide (RYBELSUS) 7 MG TABS Take 1 tablet (7 mg total) by mouth daily. 30 tablet 5   UNITHROID 137 MCG tablet Take 1 tablet (137 mcg total) by mouth daily before breakfast. 45 tablet 6   nystatin (MYCOSTATIN/NYSTOP) powder Apply 1 Application topically 3 (three) times daily. Apply to affected area for up to 7 days. 30 g 2   No current facility-administered medications for this visit.    ALLERGIES: Prednisone, Trulicity [dulaglutide], Fluoxetine, Glimepiride, Prozac [fluoxetine hcl], Hydrocodone, and Metformin and related  Family History  Problem Relation Age of Onset   Hypertension Mother    Cancer Mother    Breast cancer Mother 46   Hypertension Father    Diabetes Father    Cancer Father    Ovarian cancer Maternal Aunt    Thyroid disease Maternal Aunt        hypothyroid    Review of Systems  All other systems reviewed and are negative.   PHYSICAL EXAM:  BP 126/72 (BP Location: Left Arm, Patient Position: Sitting, Cuff Size: Large)   Pulse 81   Ht 5' 6.5" (1.689 m)   Wt (!) 341 lb (154.7 kg)   LMP  (LMP Unknown)   SpO2 98%   BMI 54.21 kg/m     General appearance: alert, cooperative and appears stated age Head: normocephalic, without obvious abnormality, atraumatic Neck: no adenopathy, supple, symmetrical, trachea midline and thyroid normal to inspection and palpation Lungs: clear to auscultation bilaterally Breasts: normal appearance, no masses or  tenderness, No nipple retraction or dimpling, No nipple discharge or bleeding, No axillary adenopathy Heart: regular rate  and rhythm Abdomen: pannus.  Abdomen is soft, non-tender; no masses, no organomegaly.   Extremities: extremities normal, atraumatic, no cyanosis or edema Skin: skin color, texture, turgor normal. Grayish coloration of skin under breasts and in groin bilaterally.  Lymph nodes: cervical, supraclavicular, and axillary nodes normal. Neurologic: grossly normal  Pelvic: External genitalia:  no lesions              No abnormal inguinal nodes palpated.              Urethra:  normal appearing urethra with no masses, tenderness or lesions              Bartholins and Skenes: normal                 Vagina: normal appearing vagina with normal color and discharge, no lesions              Cervix: no lesions              Pap taken: Yes.   Bimanual Exam:  Uterus:  normal size, contour, position, consistency, mobility, non-tender              Adnexa: no mass, fullness, tenderness.  Exam limited by BMI.              Rectal exam: Yes.  .  Confirms.              Anus:  normal sphincter tone, no lesions  Chaperone was present for exam:  Warren Lacy, CMA  ASSESSMENT: Well woman visit with gynecologic exam Cervical cancer screening.  Hx simple right ovarian cyst and left hydrosalpinx.  No follow up imaging recommended.  Candida of flexural skin folds.  DM.  On Jardiance and Semaglutide.  A1C 8.0. Colon cancer screening.  FH breast cancer in mother.   PHQ-9:  1  PLAN: Mammogram screening discussed.  She will schedule.  Information given regarding locations for breast cancer screening.  Self breast awareness reviewed. Pap and HRV collected:  Yes.   Guidelines for Calcium, Vitamin D, regular exercise program including cardiovascular and weight bearing exercise. Medication refills:  Nystatin powder.  Cologuard ordered.   Labs with PCP. My condolences are expressed to Maniya's for the  loss of her family members.  Follow up:  yearly and prn.

## 2023-07-10 ENCOUNTER — Other Ambulatory Visit (HOSPITAL_COMMUNITY): Payer: Self-pay

## 2023-07-11 ENCOUNTER — Other Ambulatory Visit (HOSPITAL_COMMUNITY): Payer: Self-pay

## 2023-07-12 ENCOUNTER — Other Ambulatory Visit: Payer: Self-pay | Admitting: Internal Medicine

## 2023-07-12 DIAGNOSIS — E1165 Type 2 diabetes mellitus with hyperglycemia: Secondary | ICD-10-CM

## 2023-07-17 ENCOUNTER — Telehealth: Payer: Self-pay

## 2023-07-17 NOTE — Progress Notes (Signed)
Care Guide Pharmacy Note  07/17/2023 Name: [name] MRN: [id number] DOB: 11-10-1965  Referred By: Pincus Sanes, MD Reason for referral: Care Coordination (Outreach to schedule with Pharm d )   Erin [name] is a 58 y.o. year old female who is a primary care patient of Pincus Sanes, MD.  [name] was referred to the pharmacist for assistance related to: DMII  Successful contact was made with the patient to discuss pharmacy services including being ready for the pharmacist to call at least 5 minutes before the scheduled appointment time and to have medication bottles and any blood pressure readings ready for review. The patient agreed to meet with the pharmacist via telephone visit on (date/time).07/31/2023  Penne Lash , RMA     [place]  [facility] Guide  Direct Dial: [phone]  Website: [email or web address]

## 2023-07-19 ENCOUNTER — Other Ambulatory Visit (HOSPITAL_COMMUNITY): Payer: Self-pay

## 2023-07-19 ENCOUNTER — Other Ambulatory Visit: Payer: Self-pay

## 2023-07-19 ENCOUNTER — Other Ambulatory Visit: Payer: Self-pay | Admitting: Internal Medicine

## 2023-07-19 MED ORDER — LOSARTAN POTASSIUM 100 MG PO TABS
100.0000 mg | ORAL_TABLET | Freq: Every day | ORAL | 3 refills | Status: DC
  Filled 2023-07-19: qty 90, 90d supply, fill #0
  Filled 2023-10-18: qty 90, 90d supply, fill #1
  Filled 2024-01-15: qty 90, 90d supply, fill #2
  Filled 2024-04-20 (×2): qty 90, 90d supply, fill #3

## 2023-07-22 ENCOUNTER — Encounter: Payer: Self-pay | Admitting: Obstetrics and Gynecology

## 2023-07-22 ENCOUNTER — Other Ambulatory Visit (HOSPITAL_COMMUNITY): Payer: Self-pay

## 2023-07-22 ENCOUNTER — Other Ambulatory Visit (HOSPITAL_COMMUNITY)
Admission: RE | Admit: 2023-07-22 | Discharge: 2023-07-22 | Disposition: A | Discharge status: Home / Self Care | Payer: 59 | Source: Ambulatory Visit | Attending: Obstetrics and Gynecology | Admitting: Obstetrics and Gynecology

## 2023-07-22 ENCOUNTER — Ambulatory Visit (INDEPENDENT_AMBULATORY_CARE_PROVIDER_SITE_OTHER): Payer: 59 | Admitting: Obstetrics and Gynecology

## 2023-07-22 VITALS — BP 126/72 | HR 81 | Ht 66.5 in | Wt 341.0 lb

## 2023-07-22 DIAGNOSIS — Z1331 Encounter for screening for depression: Secondary | ICD-10-CM

## 2023-07-22 DIAGNOSIS — Z124 Encounter for screening for malignant neoplasm of cervix: Secondary | ICD-10-CM | POA: Insufficient documentation

## 2023-07-22 DIAGNOSIS — B372 Candidiasis of skin and nail: Secondary | ICD-10-CM | POA: Diagnosis not present

## 2023-07-22 DIAGNOSIS — Z1211 Encounter for screening for malignant neoplasm of colon: Secondary | ICD-10-CM

## 2023-07-22 DIAGNOSIS — Z01419 Encounter for gynecological examination (general) (routine) without abnormal findings: Secondary | ICD-10-CM | POA: Diagnosis not present

## 2023-07-22 MED ORDER — NYSTATIN 100000 UNIT/GM EX POWD
1.0000 | Freq: Three times a day (TID) | CUTANEOUS | 2 refills | Status: AC
  Filled 2023-07-22: qty 30, 10d supply, fill #0

## 2023-07-22 NOTE — Patient Instructions (Signed)

## 2023-07-25 ENCOUNTER — Encounter: Payer: Self-pay | Admitting: Obstetrics and Gynecology

## 2023-07-25 LAB — CYTOLOGY - PAP
Adequacy: ABSENT
Comment: NEGATIVE
Diagnosis: NEGATIVE
High risk HPV: NEGATIVE

## 2023-07-31 ENCOUNTER — Encounter: Payer: Self-pay | Admitting: Internal Medicine

## 2023-07-31 ENCOUNTER — Other Ambulatory Visit (INDEPENDENT_AMBULATORY_CARE_PROVIDER_SITE_OTHER): Payer: 59 | Admitting: Pharmacist

## 2023-07-31 ENCOUNTER — Other Ambulatory Visit (HOSPITAL_COMMUNITY): Payer: Self-pay

## 2023-07-31 DIAGNOSIS — E1165 Type 2 diabetes mellitus with hyperglycemia: Secondary | ICD-10-CM

## 2023-07-31 LAB — COLOGUARD

## 2023-07-31 NOTE — Patient Instructions (Signed)
 It was a pleasure speaking with you today!  [facility] has the coupon information to apply to your next refills of Jardiance  and Rybelsus . I have uploaded the information to your chart as well if needed for future reference.  Feel free to call with any questions or concerns!  Darrelyn Drum, PharmD, BCPS, CPP Clinical Pharmacist Practitioner [facility] [phone]

## 2023-07-31 NOTE — Progress Notes (Signed)
 07/31/2023 Name: Erin [name] MRN: [id number] DOB: 21-Apr-1966  Chief Complaint  Patient presents with   Diabetes   Medication Access    Erin [name] is a 58 y.o. year old female who presented for a telephone visit.   They were referred to the pharmacist by their PCP for assistance in managing diabetes and medication access.    Subjective:  Care Team: Primary Care Provider: Geofm Glade PARAS, MD ; Next Scheduled Visit: 10/04/23  Medication Access/Adherence  Current Pharmacy:  [facility] - [facility] [address] [place] [address] Phone: [phone] Fax: [phone]  [name] - Erin [name], [place] - [address] AT [facility] OF [address] [place] [address] Phone: [phone] Fax: [phone]   Patient reports affordability concerns with their medications: Yes  Patient reports access/transportation concerns to their pharmacy: No  Patient reports adherence concerns with their medications:  No     Diabetes:  Current medications: Jardiance  25 mg daily, Rybelsus  7 mg daily Medications tried in the past: Trulicity , metformin , Novolog , Lantus   *Cost of Jardiance  and Rybelsus  is high due to high deductible pla  Current glucose readings: BG 145 this AM, sometimes 90-110   Objective:  Lab Results  Component Value Date   HGBA1C 8.0 (H) 07/03/2023    Lab Results  Component Value Date   CREATININE 0.76 07/03/2023   BUN 13 07/03/2023   NA 141 07/03/2023   K 3.9 07/03/2023   CL 106 07/03/2023   CO2 27 07/03/2023    Lab Results  Component Value Date   CHOL 132 07/03/2023   HDL 41.30 07/03/2023   LDLCALC 73 07/03/2023   TRIG 89.0 07/03/2023   CHOLHDL 3 07/03/2023    Medications Reviewed Today     Reviewed by Erin [name], RPH (Pharmacist) on 07/31/23 at 1048  Med List Status: <None>   Medication Order Taking? Sig Documenting Provider Last Dose Status  Informant  ALPRAZolam  (XANAX ) 0.5 MG tablet [id number]  Take 1 tablet (0.5 mg total) by mouth 2 (two) times daily as needed for anxiety. Erin Glade PARAS, MD  Active   Blood Glucose Monitoring Suppl (FREESTYLE LITE) w/Device KIT [id number]  Use as directed. Erin Glade PARAS, MD  Active   cholecalciferol (VITAMIN D) 1000 units tablet [id number]  Take 1,000 Units by mouth daily. [provider]  Active Self  clotrimazole  (LOTRIMIN ) 1 % cream [id number]  Apply 1 application topically 2 (two) times daily. Use for 2 weeks at a time as needed. Cathlyn JAYSON Nikki Bobie FORBES, MD  Active   diltiazem  (TIAZAC ) 120 MG 24 hr capsule [id number]  Take 1 capsule (120 mg total) by mouth daily. Erin Glade PARAS, MD  Active   docusate sodium  (COLACE) 100 MG capsule [id number]  Take 1-3 capsules (100-300 mg total) by mouth daily as needed for mild constipation. Erin Glade PARAS, MD  Active   empagliflozin  (JARDIANCE ) 25 MG TABS tablet [id number] Yes Take 1 tablet (25 mg total) by mouth daily before breakfast. Erin Glade PARAS, MD Taking Active   famotidine  (PEPCID ) 20 MG tablet [id number]  Take 1 tablet (20 mg total) by mouth 2 (two) times daily. Erin Glade PARAS, MD  Active   glucose blood (FREESTYLE LITE) test strip [id number]  Use as instructed Erin Glade PARAS, MD  Active   Lancets (FREESTYLE) lancets [id number]  Use as instructed Erin Glade PARAS, MD  Active   losartan  (COZAAR ) 100 MG  tablet [id number]  Take 1 tablet (100 mg total) by mouth daily. Erin Glade PARAS, MD  Active   meloxicam  (MOBIC ) 15 MG tablet [id number]  Take 1 tablet (15 mg total) by mouth daily. Erin Glade PARAS, MD  Active   methocarbamol  (ROBAXIN ) 750 MG tablet [id number]  Take 1 tablet (750 mg total) by mouth 4 (four) times daily. Rollene Almarie LABOR, MD  Active   Multiple Vitamin (MULTIVITAMIN) tablet [id number]  Take 1 tablet by mouth daily. [provider]  Active Self  nystatin  (MYCOSTATIN /NYSTOP ) powder [id number]  Apply 1 Application topically 3 (three)  times daily. Apply to affected area for up to 7 days. Cathlyn JAYSON Nikki Bobie FORBES, MD  Active   rosuvastatin  (CRESTOR ) 10 MG tablet [id number]  Take 1 tablet (10 mg total) by mouth daily. Erin Glade PARAS, MD  Active   Semaglutide  (RYBELSUS ) 7 MG TABS [id number] Yes Take 1 tablet (7 mg total) by mouth daily. Erin Glade PARAS, MD Taking Active   UNITHROID  137 MCG tablet [id number]  Take 1 tablet (137 mcg total) by mouth daily before breakfast. Trixie File, MD  Active               Assessment/Plan:   Diabetes: - Currently uncontrolled, A1c goal <7% - Reviewed goal fasting, 90-130 - Registered patient for copay savings cards for Rybelsus  and Jardiance . Sent information to [facility].   Follow Up Plan: PRN  Erin [name], PharmD, BCPS, CPP Clinical Pharmacist Practitioner [facility] [phone]

## 2023-08-01 ENCOUNTER — Other Ambulatory Visit (HOSPITAL_COMMUNITY): Payer: Self-pay

## 2023-08-05 ENCOUNTER — Other Ambulatory Visit (HOSPITAL_COMMUNITY): Payer: Self-pay

## 2023-08-06 DIAGNOSIS — Z1211 Encounter for screening for malignant neoplasm of colon: Secondary | ICD-10-CM | POA: Diagnosis not present

## 2023-08-07 ENCOUNTER — Ambulatory Visit (INDEPENDENT_AMBULATORY_CARE_PROVIDER_SITE_OTHER): Payer: 59 | Admitting: Adult Health

## 2023-08-07 ENCOUNTER — Encounter: Payer: Self-pay | Admitting: Adult Health

## 2023-08-07 VITALS — BP 133/96 | Ht 65.0 in | Wt 342.8 lb

## 2023-08-07 DIAGNOSIS — G4733 Obstructive sleep apnea (adult) (pediatric): Secondary | ICD-10-CM | POA: Diagnosis not present

## 2023-08-07 NOTE — Progress Notes (Signed)
PATIENT: Erin [name] DOB: [birth year]  REASON FOR VISIT: follow up HISTORY FROM: patient  Chief Complaint  Patient presents with   Follow-up    Patient in room #9 and alone. Patient states she been doing well with her CPAP ,she would like for the pressure to be reduced for her comfort.   '  HISTORY OF PRESENT ILLNESS: Today 08/07/23:  Erin [name] is a 58 y.o. female with a history of OSA on CPAP. Returns today for follow-up.  Reports that CPAP is working well.  She does feel that the pressure is too low when she initially starts the CPAP. download is below       2/12/24Tabatha A [name] is a 58 y.o. female with a history of OSA on CPAP. Returns today for follow-up.  Reports that she is the pressure may be a little too high causing air into her stomach and feeling nauseous.  Reports that CPAP continues to work well for her.  States that she sleeps well on the machine.  Download is below       08/03/21: Erin [name] is a 58 year old female with a history of obstructive sleep apnea on CPAP.  Reports that the CPAP is working for her.  She would like for her pressure to be lower.  She states that she is working with her primary care for her headaches.  States that they have improved slightly since starting CPAP therapy feels that her headaches may be related to eye pressure.  Returns today for an evaluation.    08/03/20: Erin [name] is a 58 year old female with a history of obstructive sleep apnea on CPAP.  She reports that the CPAP is working well.  She denies any new issues.  She returns today for an evaluation.   Compliance Report Usage 07/04/2020 - 08/02/2020 Usage days 30/30 days (100%) >= 4 hours 30 days (100%) Average usage (days used) 8 hours 10 minutes  AirSense 10 AutoSet Serial number [id number] Mode AutoSet Min Pressure 7 cmH2O Max Pressure 14 cmH2O EPR Fulltime EPR level 2 Response Standard  Therapy Pressure - cmH2O Median: 9.6 95th percentile: 12.2  Maximum: 13.3 Leaks - L/min Median: 0.4 95th percentile: 11.8 Maximum: 37.6 Events per hour AI: 0.1 HI: 0.0 AHI: 0.1 Apnea Index Central: 0.0 Obstructive: 0.0 Unknown: 0.0  REVIEW OF SYSTEMS: Out of a complete 14 system review of symptoms, the patient complains only of the following symptoms, and all other reviewed systems are negative.  ESS 8  ALLERGIES: Allergies  Allergen Reactions   Prednisone Other (See Comments)    Raises blood sugar so high that she receives intensive care   Trulicity [Dulaglutide] Diarrhea and Nausea And Vomiting   Fluoxetine    Glimepiride     Stomach upset   Prozac [Fluoxetine Hcl] Nausea Only   Hydrocodone Hives, Itching and Rash    On thighs    Metformin And Related     diarrhea    HOME MEDICATIONS: Outpatient Medications Prior to Visit  Medication Sig Dispense Refill   ALPRAZolam (XANAX) 0.5 MG tablet Take 1 tablet (0.5 mg total) by mouth 2 (two) times daily as needed for anxiety. 60 tablet 0   Blood Glucose Monitoring Suppl (FREESTYLE LITE) w/Device KIT Use as directed. 1 kit 0   cholecalciferol (VITAMIN D) 1000 units tablet Take 1,000 Units by mouth daily.     clotrimazole (LOTRIMIN) 1 % cream Apply 1 application topically 2 (two) times daily. Use for 2 weeks at a time as  needed. 30 g 2   diltiazem (TIAZAC) 120 MG 24 hr capsule Take 1 capsule (120 mg total) by mouth daily. 90 capsule 3   docusate sodium (COLACE) 100 MG capsule Take 1-3 capsules (100-300 mg total) by mouth daily as needed for mild constipation. 90 capsule 5   empagliflozin (JARDIANCE) 25 MG TABS tablet Take 1 tablet (25 mg total) by mouth daily before breakfast. 30 tablet 5   famotidine (PEPCID) 20 MG tablet Take 1 tablet (20 mg total) by mouth 2 (two) times daily. 180 tablet 3   glucose blood (FREESTYLE LITE) test strip Use as instructed 100 each 12   Lancets (FREESTYLE) lancets Use as instructed 100 each 12   losartan (COZAAR) 100 MG tablet Take 1 tablet (100 mg total) by mouth  daily. 90 tablet 3   meloxicam (MOBIC) 15 MG tablet Take 1 tablet (15 mg total) by mouth daily. 30 tablet 3   methocarbamol (ROBAXIN) 750 MG tablet Take 1 tablet (750 mg total) by mouth 4 (four) times daily. 120 tablet 2   Multiple Vitamin (MULTIVITAMIN) tablet Take 1 tablet by mouth daily.     nystatin (MYCOSTATIN/NYSTOP) powder Apply 1 Application topically 3 (three) times daily. Apply to affected area for up to 7 days. 30 g 2   rosuvastatin (CRESTOR) 10 MG tablet Take 1 tablet (10 mg total) by mouth daily. 90 tablet 3   Semaglutide (RYBELSUS) 7 MG TABS Take 1 tablet (7 mg total) by mouth daily. 30 tablet 5   UNITHROID 137 MCG tablet Take 1 tablet (137 mcg total) by mouth daily before breakfast. 45 tablet 6   No facility-administered medications prior to visit.    PAST MEDICAL HISTORY: Past Medical History:  Diagnosis Date   Abnormal uterine bleeding    Anemia    COVID 12/2018   Depression    Diabetes mellitus without complication (HCC)    gestational   Elevated blood-pressure reading without diagnosis of hypertension    Endometriosis    Fibroids    GERD (gastroesophageal reflux disease)    Graves disease    Grief at loss of child    H/O gestational diabetes mellitus, not currently pregnant 09/28/2015   Hypertension    Hypothyroidism    Interstitial cystitis    Menorrhagia    Oropharyngeal candidiasis 01/16/2018   OSA (obstructive sleep apnea), severe 06/23/2018   Premature ventricular contractions    a. rare PVC by monitor 12/2016.   Right ovarian cyst    3 cm.   Thyroid disease    Uterine leiomyoma     PAST SURGICAL HISTORY: Past Surgical History:  Procedure Laterality Date   APPENDECTOMY     ARTERY BIOPSY Left 10/25/2017   Procedure: BIOPSY TEMPORAL ARTERY;  Surgeon: Franky Macho, MD;  Location: [facility];  Service: General;  Laterality: Left;   CESAREAN SECTION     CHOLECYSTECTOMY     HERNIA REPAIR     incisional   TUMOR REMOVAL  fibroids    FAMILY  HISTORY: Family History  Problem Relation Age of Onset   Hypertension Mother    Cancer Mother    Breast cancer Mother 70   Hypertension Father    Diabetes Father    Cancer Father    Ovarian cancer Maternal Aunt    Thyroid disease Maternal Aunt        hypothyroid    SOCIAL HISTORY: Social History   Socioeconomic History   Marital status: Married    Spouse name: Not on file  Number of children: Not on file   Years of education: Not on file   Highest education level: Associate degree: occupational, technical, or vocational program  Occupational History   Not on file  Tobacco Use   Smoking status: Never   Smokeless tobacco: Never  Vaping Use   Vaping status: Never Used  Substance and Sexual Activity   Alcohol use: No   Drug use: No   Sexual activity: Never    Birth control/protection: Post-menopausal  Other Topics Concern   Not on file  Social History Narrative   Caffeine: maybe 4 cups/day (tea/coffee)   Social Drivers of Corporate investment banker Strain: Low Risk  (07/02/2023)   Overall Financial Resource Strain (CARDIA)    Difficulty of Paying Living Expenses: Not very hard  Food Insecurity: Food Insecurity Present (07/02/2023)   Hunger Vital Sign    Worried About Running Out of Food in the Last Year: Sometimes true    Ran Out of Food in the Last Year: Sometimes true  Transportation Needs: No Transportation Needs (07/02/2023)   PRAPARE - Administrator, Civil Service (Medical): No    Lack of Transportation (Non-Medical): No  Physical Activity: Insufficiently Active (07/02/2023)   Exercise Vital Sign    Days of Exercise per Week: 3 days    Minutes of Exercise per Session: 20 min  Stress: No Stress Concern Present (07/02/2023)   [employer] of Occupational Health - Occupational Stress Questionnaire    Feeling of Stress : Not at all  Social Connections: Socially Integrated (07/02/2023)   Social Connection and Isolation Panel [NHANES]    Frequency of  Communication with Friends and Family: Twice a week    Frequency of Social Gatherings with Friends and Family: More than three times a week    Attends Religious Services: More than 4 times per year    Active Member of [employer] or Organizations: Yes    Attends Engineer, structural: More than 4 times per year    Marital Status: Married  Catering manager Violence: Not on file      PHYSICAL EXAM  Vitals:   08/07/23 1433  BP: (!) 133/96  Weight: (!) 342 lb 12.8 oz (155.5 kg)  Height: 5\' 5"  (1.651 m)    Body mass index is 57.04 kg/m.  Generalized: Well developed, in no acute distress  Chest: Lungs clear to auscultation bilaterally  Neurological examination  Mentation: Alert oriented to time, place, history taking. Follows all commands speech and language fluent Cranial nerve II-XII: Extraocular movements were full, visual field were full on confrontational test Head turning and shoulder shrug  were normal and symmetric. Motor: The motor testing reveals 5 over 5 strength of all 4 extremities. Good symmetric motor tone is noted throughout.  Sensory: Sensory testing is intact to soft touch on all 4 extremities. No evidence of extinction is noted.  Gait and station: Gait is normal.    DIAGNOSTIC DATA (LABS, IMAGING, TESTING) - I reviewed patient records, labs, notes, testing and imaging myself where available.  Lab Results  Component Value Date   WBC 14.5 (H) 07/03/2023   HGB 12.8 07/03/2023   HCT 40.0 07/03/2023   MCV 84.8 07/03/2023   PLT 346.0 07/03/2023      Component Value Date/Time   NA 141 07/03/2023 1614   NA 140 12/05/2016 1616   K 3.9 07/03/2023 1614   CL 106 07/03/2023 1614   CO2 27 07/03/2023 1614   GLUCOSE 101 (H) 07/03/2023 1614  BUN 13 07/03/2023 1614   BUN 12 12/05/2016 1616   CREATININE 0.76 07/03/2023 1614   CREATININE 0.82 01/14/2019 0943   CALCIUM 9.7 07/03/2023 1614   PROT 8.0 07/03/2023 1614   ALBUMIN 4.5 07/03/2023 1614   AST 17  07/03/2023 1614   AST 14 (L) 01/14/2019 0943   ALT 20 07/03/2023 1614   ALT 19 01/14/2019 0943   ALKPHOS 232 (H) 07/03/2023 1614   BILITOT 0.6 07/03/2023 1614   BILITOT 0.5 01/14/2019 0943   GFRNONAA >60 07/13/2022 0141   GFRNONAA >60 01/14/2019 0943   GFRAA >60 01/14/2019 0943   Lab Results  Component Value Date   CHOL 132 07/03/2023   HDL 41.30 07/03/2023   LDLCALC 73 07/03/2023   TRIG 89.0 07/03/2023   CHOLHDL 3 07/03/2023   Lab Results  Component Value Date   HGBA1C 8.0 (H) 07/03/2023   Lab Results  Component Value Date   VITAMINB12 705 01/14/2019   Lab Results  Component Value Date   TSH 0.23 (L) 07/03/2023      ASSESSMENT AND PLAN 58 y.o. year old female  has a past medical history of Abnormal uterine bleeding, Anemia, COVID (12/2018), Depression, Diabetes mellitus without complication (HCC), Elevated blood-pressure reading without diagnosis of hypertension, Endometriosis, Fibroids, GERD (gastroesophageal reflux disease), Graves disease, Grief at loss of child, H/O gestational diabetes mellitus, not currently pregnant (09/28/2015), Hypertension, Hypothyroidism, Interstitial cystitis, Menorrhagia, Oropharyngeal candidiasis (01/16/2018), OSA (obstructive sleep apnea), severe (06/23/2018), Premature ventricular contractions, Right ovarian cyst, Thyroid disease, and Uterine leiomyoma. here with:  OSA on CPAP  - CPAP compliance excellent - Good treatment of AHI  -We will decrease pressure 6-12cmH2O increase EPR 3 - Encourage patient to use CPAP nightly and > 4 hours each night - F/U in 1 year or sooner if needed    Butch Penny, MSN, NP-C 08/07/2023, 2:51 PM [facility] [address] [place], [place] [address] [phone]

## 2023-08-14 ENCOUNTER — Encounter: Payer: Self-pay | Admitting: Adult Health

## 2023-08-14 ENCOUNTER — Other Ambulatory Visit (HOSPITAL_COMMUNITY): Payer: Self-pay

## 2023-08-14 DIAGNOSIS — G4733 Obstructive sleep apnea (adult) (pediatric): Secondary | ICD-10-CM

## 2023-08-14 LAB — COLOGUARD: COLOGUARD: NEGATIVE

## 2023-08-16 ENCOUNTER — Other Ambulatory Visit: Payer: Self-pay

## 2023-08-16 ENCOUNTER — Other Ambulatory Visit (HOSPITAL_COMMUNITY): Payer: Self-pay

## 2023-08-19 NOTE — Telephone Encounter (Signed)
 Made change to [employer],   (only EPR 3) as 7-12cm already done.

## 2023-08-19 NOTE — Telephone Encounter (Signed)
 New, Doristine Mango, RN; Tomasa Rand; 1 other Received, thank you!     Previous Messages    ----- Message ----- From: Guy Begin, RN Sent: 08/19/2023   8:15 AM EST To: Alain Honey; Kathe Becton; * Subject: changes to cpap settings                      Change pressure back to 7-12-cm EPR 3.  Already done in [place].    Sandy Rn  [facility] Female, 59 y.o., [birth year] Pronouns: she/her/hers MRN: [id number] Phon

## 2023-08-21 ENCOUNTER — Other Ambulatory Visit (HOSPITAL_COMMUNITY): Payer: Self-pay

## 2023-08-28 ENCOUNTER — Other Ambulatory Visit (HOSPITAL_COMMUNITY): Payer: Self-pay

## 2023-08-28 ENCOUNTER — Other Ambulatory Visit: Payer: Self-pay | Admitting: Internal Medicine

## 2023-08-28 ENCOUNTER — Encounter: Payer: Self-pay | Admitting: Internal Medicine

## 2023-08-28 MED ORDER — MELOXICAM 15 MG PO TABS
15.0000 mg | ORAL_TABLET | Freq: Every day | ORAL | 3 refills | Status: DC
  Filled 2023-08-28: qty 30, 30d supply, fill #0
  Filled 2023-09-24: qty 30, 30d supply, fill #1

## 2023-09-01 MED ORDER — ONDANSETRON 4 MG PO TBDP
4.0000 mg | ORAL_TABLET | Freq: Three times a day (TID) | ORAL | 0 refills | Status: AC | PRN
  Filled 2023-09-01: qty 20, 7d supply, fill #0

## 2023-09-02 ENCOUNTER — Other Ambulatory Visit (HOSPITAL_COMMUNITY): Payer: Self-pay

## 2023-09-11 ENCOUNTER — Other Ambulatory Visit (HOSPITAL_COMMUNITY): Payer: Self-pay

## 2023-09-13 ENCOUNTER — Other Ambulatory Visit (HOSPITAL_COMMUNITY): Payer: Self-pay

## 2023-09-20 ENCOUNTER — Other Ambulatory Visit (HOSPITAL_COMMUNITY): Payer: Self-pay

## 2023-09-20 DIAGNOSIS — N201 Calculus of ureter: Secondary | ICD-10-CM | POA: Diagnosis not present

## 2023-09-20 DIAGNOSIS — R1084 Generalized abdominal pain: Secondary | ICD-10-CM | POA: Diagnosis not present

## 2023-10-04 ENCOUNTER — Ambulatory Visit: Payer: 59 | Admitting: Internal Medicine

## 2023-10-13 ENCOUNTER — Encounter: Payer: Self-pay | Admitting: Internal Medicine

## 2023-10-13 NOTE — Patient Instructions (Addendum)
      A1c  today is 7.2%    Medications changes include :   None      Return in about 4 months (around 02/13/2024) for follow up.

## 2023-10-13 NOTE — Progress Notes (Unsigned)
 Subjective:    Patient ID: Erin [name], female    DOB: [birth year], 58 y.o.   MRN: [id number]     HPI Nyiesha is here for follow up of her chronic medical problems.  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  Her youngest child graduates HS this year.  She is helping to take care of her Dad.    Some exercise - trying to move more.  Still working on diet.      Medications and allergies reviewed with patient and updated if appropriate.  Current Outpatient Medications on File Prior to Visit  Medication Sig Dispense Refill   ALPRAZolam  (XANAX ) 0.5 MG tablet Take 1 tablet (0.5 mg total) by mouth 2 (two) times daily as needed for anxiety. 60 tablet 0   Blood Glucose Monitoring Suppl (FREESTYLE LITE) w/Device KIT Use as directed. 1 kit 0   cholecalciferol (VITAMIN D) 1000 units tablet Take 1,000 Units by mouth daily.     clotrimazole  (LOTRIMIN ) 1 % cream Apply 1 application topically 2 (two) times daily. Use for 2 weeks at a time as needed. 30 g 2   diltiazem  (TIAZAC ) 120 MG 24 hr capsule Take 1 capsule (120 mg total) by mouth daily. 90 capsule 3   docusate sodium  (COLACE) 100 MG capsule Take 1-3 capsules (100-300 mg total) by mouth daily as needed for mild constipation. 90 capsule 5   empagliflozin  (JARDIANCE ) 25 MG TABS tablet Take 1 tablet (25 mg total) by mouth daily before breakfast. 30 tablet 5   glucose blood (FREESTYLE LITE) test strip Use as instructed 100 each 12   Lancets (FREESTYLE) lancets Use as instructed 100 each 12   losartan  (COZAAR ) 100 MG tablet Take 1 tablet (100 mg total) by mouth daily. 90 tablet 3   methocarbamol  (ROBAXIN ) 750 MG tablet Take 1 tablet (750 mg total) by mouth 4 (four) times daily. 120 tablet 2   Multiple Vitamin (MULTIVITAMIN) tablet Take 1 tablet by mouth daily.     nystatin  (MYCOSTATIN /NYSTOP ) powder Apply 1 Application topically 3 (three) times daily. Apply to affected area for up to 7 days. 30 g 2   ondansetron   (ZOFRAN -ODT) 4 MG disintegrating tablet Take 1 tablet (4 mg total) by mouth every 8 (eight) hours as needed for nausea or vomiting. 20 tablet 0   Semaglutide  (RYBELSUS ) 7 MG TABS Take 1 tablet (7 mg total) by mouth daily. 30 tablet 5   UNITHROID  137 MCG tablet Take 1 tablet (137 mcg total) by mouth daily before breakfast. 45 tablet 6   No current facility-administered medications on file prior to visit.     Review of Systems  Constitutional:  Negative for fever.       Night sweats  Respiratory:  Negative for cough, shortness of breath and wheezing.   Cardiovascular:  Positive for palpitations. Negative for chest pain and leg swelling.  Neurological:  Positive for headaches (frequent). Negative for light-headedness.       Objective:   Vitals:   10/14/23 1426  BP: 138/70  Pulse: 79  Temp: 98.6 F (37 C)  SpO2: 96%   BP Readings from Last 3 Encounters:  10/14/23 138/70  08/07/23 (!) 133/96  07/22/23 126/72   Wt Readings from Last 3 Encounters:  10/14/23 (!) 347 lb (157.4 kg)  08/07/23 (!) 342 lb 12.8 oz (155.5 kg)  07/22/23 (!) 341 lb (154.7 kg)   Body mass index is 57.74 kg/m.    Physical  Exam Constitutional:      General: She is not in acute distress.    Appearance: Normal appearance.  HENT:     Head: Normocephalic and atraumatic.  Eyes:     Conjunctiva/sclera: Conjunctivae normal.  Cardiovascular:     Rate and Rhythm: Normal rate and regular rhythm.     Heart sounds: Normal heart sounds.  Pulmonary:     Effort: Pulmonary effort is normal. No respiratory distress.     Breath sounds: Normal breath sounds. No wheezing.  Musculoskeletal:     Cervical back: Neck supple.     Right lower leg: No edema.     Left lower leg: No edema.  Lymphadenopathy:     Cervical: No cervical adenopathy.  Skin:    General: Skin is warm and dry.     Findings: No rash.  Neurological:     Mental Status: She is alert. Mental status is at baseline.  Psychiatric:        Mood and  Affect: Mood normal.        Behavior: Behavior normal.        Lab Results  Component Value Date   WBC 14.5 (H) 07/03/2023   HGB 12.8 07/03/2023   HCT 40.0 07/03/2023   PLT 346.0 07/03/2023   GLUCOSE 101 (H) 07/03/2023   CHOL 132 07/03/2023   TRIG 89.0 07/03/2023   HDL 41.30 07/03/2023   LDLCALC 73 07/03/2023   ALT 20 07/03/2023   AST 17 07/03/2023   NA 141 07/03/2023   K 3.9 07/03/2023   CL 106 07/03/2023   CREATININE 0.76 07/03/2023   BUN 13 07/03/2023   CO2 27 07/03/2023   TSH 0.23 (L) 07/03/2023   HGBA1C 7.2 (A) 10/14/2023   HGBA1C 7.2 10/14/2023   HGBA1C 7.2 (A) 10/14/2023   HGBA1C 7.2 (A) 10/14/2023   MICROALBUR 2.5 (H) 12/26/2022     Assessment & Plan:    See Problem List for Assessment and Plan of chronic medical problems.

## 2023-10-14 ENCOUNTER — Other Ambulatory Visit: Payer: Self-pay

## 2023-10-14 ENCOUNTER — Other Ambulatory Visit (HOSPITAL_COMMUNITY): Payer: Self-pay

## 2023-10-14 ENCOUNTER — Ambulatory Visit: Admitting: Internal Medicine

## 2023-10-14 VITALS — BP 138/70 | HR 79 | Temp 98.6°F | Ht 65.0 in | Wt 347.0 lb

## 2023-10-14 DIAGNOSIS — F419 Anxiety disorder, unspecified: Secondary | ICD-10-CM | POA: Diagnosis not present

## 2023-10-14 DIAGNOSIS — E890 Postprocedural hypothyroidism: Secondary | ICD-10-CM | POA: Diagnosis not present

## 2023-10-14 DIAGNOSIS — K219 Gastro-esophageal reflux disease without esophagitis: Secondary | ICD-10-CM | POA: Diagnosis not present

## 2023-10-14 DIAGNOSIS — K2100 Gastro-esophageal reflux disease with esophagitis, without bleeding: Secondary | ICD-10-CM

## 2023-10-14 DIAGNOSIS — M5416 Radiculopathy, lumbar region: Secondary | ICD-10-CM

## 2023-10-14 DIAGNOSIS — I10 Essential (primary) hypertension: Secondary | ICD-10-CM

## 2023-10-14 DIAGNOSIS — E1165 Type 2 diabetes mellitus with hyperglycemia: Secondary | ICD-10-CM

## 2023-10-14 DIAGNOSIS — Z7984 Long term (current) use of oral hypoglycemic drugs: Secondary | ICD-10-CM | POA: Diagnosis not present

## 2023-10-14 DIAGNOSIS — E782 Mixed hyperlipidemia: Secondary | ICD-10-CM | POA: Diagnosis not present

## 2023-10-14 DIAGNOSIS — Z7985 Long-term (current) use of injectable non-insulin antidiabetic drugs: Secondary | ICD-10-CM

## 2023-10-14 LAB — POCT GLYCOSYLATED HEMOGLOBIN (HGB A1C)
HbA1c POC (<> result, manual entry): 7.2 % (ref 4.0–5.6)
HbA1c, POC (controlled diabetic range): 7.2 % — AB (ref 0.0–7.0)
HbA1c, POC (prediabetic range): 7.2 % — AB (ref 5.7–6.4)
Hemoglobin A1C: 7.2 % — AB (ref 4.0–5.6)

## 2023-10-14 MED ORDER — ROSUVASTATIN CALCIUM 10 MG PO TABS
10.0000 mg | ORAL_TABLET | Freq: Every day | ORAL | 3 refills | Status: AC
  Filled 2023-10-14: qty 90, 90d supply, fill #0
  Filled 2023-12-19 – 2024-01-15 (×2): qty 90, 90d supply, fill #1
  Filled 2024-04-06: qty 90, 90d supply, fill #2
  Filled 2024-07-14: qty 90, 90d supply, fill #3
  Filled 2024-07-14: qty 90, 90d supply, fill #0

## 2023-10-14 MED ORDER — FAMOTIDINE 20 MG PO TABS
20.0000 mg | ORAL_TABLET | Freq: Two times a day (BID) | ORAL | 3 refills | Status: AC
  Filled 2023-10-14: qty 180, 90d supply, fill #0

## 2023-10-14 MED ORDER — MELOXICAM 15 MG PO TABS
15.0000 mg | ORAL_TABLET | Freq: Every day | ORAL | 3 refills | Status: DC
  Filled 2023-10-14 – 2023-10-18 (×2): qty 30, 30d supply, fill #0
  Filled 2023-11-15 (×2): qty 30, 30d supply, fill #1
  Filled 2023-12-19: qty 30, 30d supply, fill #2
  Filled 2024-01-15: qty 30, 30d supply, fill #3

## 2023-10-14 NOTE — Assessment & Plan Note (Signed)
Chronic Management per Dr Gherghe 

## 2023-10-14 NOTE — Assessment & Plan Note (Signed)
 Chronic Intermittent symptoms Continue meloxicam  15 mg daily , methocarbamol  750 mg every 6 hours as needed  Encouraged weight loss

## 2023-10-14 NOTE — Assessment & Plan Note (Signed)
 Chronic Regular exercise and healthy diet encouraged Lab Results  Component Value Date   LDLCALC 73 07/03/2023    Continue Crestor  10 mg daily

## 2023-10-14 NOTE — Assessment & Plan Note (Signed)
 Chronic Discussed the importance of weight loss She is limited on how much exercise she can do because of knee arthritis-encouraged her to do as much she can Decrease sugars and carbs Continue Rybelsus  3.5 mg daily

## 2023-10-14 NOTE — Assessment & Plan Note (Signed)
 Chronic Controlled, but does have high stress still Continue alprazolam 0.5 mg twice daily as needed-she does not take often.

## 2023-10-14 NOTE — Assessment & Plan Note (Signed)
Chronic Controlled Continue famotidine 20 mg twice daily

## 2023-10-14 NOTE — Assessment & Plan Note (Signed)
 Chronic Blood pressure a little higher than ideal controlled Working on weight loss - stressed exercise and working on diet Continue diltiazem  120 mg daily, losartan  to 100 mg daily

## 2023-10-14 NOTE — Assessment & Plan Note (Signed)
 Chronic  Lab Results  Component Value Date   HGBA1C 7.2 (A) 10/14/2023   HGBA1C 7.2 10/14/2023   HGBA1C 7.2 (A) 10/14/2023   HGBA1C 7.2 (A) 10/14/2023   Check A1c today - 7.2% Continue Rybelsus   3.5 mg daily -cutting 7 mg in half and tolerating it-did not tolerate 7 mg Continue Jardiance  25 mg daily Stressed regular exercise, diabetic diet Continue working on weight loss

## 2023-10-18 ENCOUNTER — Encounter: Payer: Self-pay | Admitting: Obstetrics and Gynecology

## 2023-10-18 ENCOUNTER — Other Ambulatory Visit (HOSPITAL_COMMUNITY): Payer: Self-pay

## 2023-10-18 ENCOUNTER — Other Ambulatory Visit: Payer: Self-pay

## 2023-10-18 DIAGNOSIS — N898 Other specified noninflammatory disorders of vagina: Secondary | ICD-10-CM

## 2023-10-18 MED ORDER — FLUCONAZOLE 150 MG PO TABS
150.0000 mg | ORAL_TABLET | Freq: Once | ORAL | 0 refills | Status: DC | PRN
  Filled 2023-10-18: qty 2, 2d supply, fill #0

## 2023-10-22 ENCOUNTER — Other Ambulatory Visit (HOSPITAL_COMMUNITY): Payer: Self-pay

## 2023-10-22 ENCOUNTER — Ambulatory Visit: Admitting: Obstetrics and Gynecology

## 2023-10-22 ENCOUNTER — Encounter: Payer: Self-pay | Admitting: Obstetrics and Gynecology

## 2023-10-22 VITALS — BP 118/74 | HR 92 | Temp 98.4°F | Wt 346.0 lb

## 2023-10-22 DIAGNOSIS — B3731 Acute candidiasis of vulva and vagina: Secondary | ICD-10-CM

## 2023-10-22 DIAGNOSIS — B372 Candidiasis of skin and nail: Secondary | ICD-10-CM | POA: Diagnosis not present

## 2023-10-22 LAB — WET PREP FOR TRICH, YEAST, CLUE

## 2023-10-22 MED ORDER — FLUCONAZOLE 150 MG PO TABS
150.0000 mg | ORAL_TABLET | ORAL | 1 refills | Status: AC
  Filled 2023-10-22: qty 3, 9d supply, fill #0

## 2023-10-22 NOTE — Progress Notes (Signed)
 58 y.o. [phone] female here for vaginitis sx.  No LMP recorded (lmp unknown). Patient is postmenopausal.   Pt c/o vaginal itching, no other symptoms. Pt previously took diflucan  & it helped some but she still has some internal vaginal itching. T2DM on jardiance  +wet prep 03/2023 Treated over the phone 1 since on 04/25 with diflucan  x2, last dose 2d ago. Still feeling itchy. Frustrated with recurrent sx. Also treated multiple times September to October 2024 without additional testing. Using nystatin  powder for skin yeast   OB History  Gravida Para Term Preterm AB Living  9 9 9   7   SAB IAB Ectopic Multiple Live Births      9    # Outcome Date GA Lbr Len/2nd Weight Sex Type Anes PTL Lv  9 Term           8 Term           7 Term           6 Term           5 Term           4 Term           3 Term           2 Term           1 Term             Past Medical History:  Diagnosis Date   Abnormal uterine bleeding    Anemia    COVID 12/2018   Depression    Diabetes mellitus without complication (HCC)    gestational   Elevated blood-pressure reading without diagnosis of hypertension    Endometriosis    Fibroids    GERD (gastroesophageal reflux disease)    Graves disease    Grief at loss of child    H/O gestational diabetes mellitus, not currently pregnant 09/28/2015   Hypertension    Hypothyroidism    Interstitial cystitis    Menorrhagia    Oropharyngeal candidiasis 01/16/2018   OSA (obstructive sleep apnea), severe 06/23/2018   Premature ventricular contractions    a. rare PVC by monitor 12/2016.   Right ovarian cyst    3 cm.   Thyroid  disease    Uterine leiomyoma     Past Surgical History:  Procedure Laterality Date   APPENDECTOMY     ARTERY BIOPSY Left 10/25/2017   Procedure: BIOPSY TEMPORAL ARTERY;  Surgeon: Alanda Allegra, MD;  Location: [facility];  Service: General;  Laterality: Left;   CESAREAN SECTION     CHOLECYSTECTOMY     HERNIA REPAIR     incisional   TUMOR  REMOVAL  fibroids    Current Outpatient Medications on File Prior to Visit  Medication Sig Dispense Refill   ALPRAZolam  (XANAX ) 0.5 MG tablet Take 1 tablet (0.5 mg total) by mouth 2 (two) times daily as needed for anxiety. 60 tablet 0   Blood Glucose Monitoring Suppl (FREESTYLE LITE) w/Device KIT Use as directed. 1 kit 0   cholecalciferol (VITAMIN D) 1000 units tablet Take 1,000 Units by mouth daily.     clotrimazole  (LOTRIMIN ) 1 % cream Apply 1 application topically 2 (two) times daily. Use for 2 weeks at a time as needed. 30 g 2   diltiazem  (TIAZAC ) 120 MG 24 hr capsule Take 1 capsule (120 mg total) by mouth daily. 90 capsule 3   docusate sodium  (COLACE) 100 MG capsule Take 1-3 capsules (100-300 mg total) by mouth daily  as needed for mild constipation. 90 capsule 5   empagliflozin  (JARDIANCE ) 25 MG TABS tablet Take 1 tablet (25 mg total) by mouth daily before breakfast. 30 tablet 5   famotidine  (PEPCID ) 20 MG tablet Take 1 tablet (20 mg total) by mouth 2 (two) times daily. 180 tablet 3   glucose blood (FREESTYLE LITE) test strip Use as instructed 100 each 12   Lancets (FREESTYLE) lancets Use as instructed 100 each 12   losartan  (COZAAR ) 100 MG tablet Take 1 tablet (100 mg total) by mouth daily. 90 tablet 3   meloxicam  (MOBIC ) 15 MG tablet Take 1 tablet (15 mg total) by mouth daily. 30 tablet 3   methocarbamol  (ROBAXIN ) 750 MG tablet Take 1 tablet (750 mg total) by mouth 4 (four) times daily. 120 tablet 2   Multiple Vitamin (MULTIVITAMIN) tablet Take 1 tablet by mouth daily.     nystatin  (MYCOSTATIN /NYSTOP ) powder Apply 1 Application topically 3 (three) times daily. Apply to affected area for up to 7 days. 30 g 2   ondansetron  (ZOFRAN -ODT) 4 MG disintegrating tablet Take 1 tablet (4 mg total) by mouth every 8 (eight) hours as needed for nausea or vomiting. 20 tablet 0   rosuvastatin  (CRESTOR ) 10 MG tablet Take 1 tablet (10 mg total) by mouth daily. 90 tablet 3   Semaglutide  (RYBELSUS ) 7 MG  TABS Take 1 tablet (7 mg total) by mouth daily. 30 tablet 5   UNITHROID  137 MCG tablet Take 1 tablet (137 mcg total) by mouth daily before breakfast. 45 tablet 6   No current facility-administered medications on file prior to visit.    Allergies  Allergen Reactions   Prednisone  Other (See Comments)    Raises blood sugar so high that she receives intensive care   Trulicity  [Dulaglutide ] Diarrhea and Nausea And Vomiting   Fluoxetine     Glimepiride      Stomach upset   Prozac  [Fluoxetine  Hcl] Nausea Only   Hydrocodone Hives, Itching and Rash    On thighs    Metformin  And Related     diarrhea      PE Today's Vitals   10/22/23 0847  BP: 118/74  Pulse: 92  Temp: 98.4 F (36.9 C)  TempSrc: Oral  SpO2: 99%  Weight: (!) 346 lb (156.9 kg)   Body mass index is 57.58 kg/m.  Physical Exam Vitals reviewed. Exam conducted with a chaperone present.  Constitutional:      General: She is not in acute distress.    Appearance: Normal appearance.  HENT:     Head: Normocephalic and atraumatic.     Nose: Nose normal.  Eyes:     Extraocular Movements: Extraocular movements intact.     Conjunctiva/sclera: Conjunctivae normal.  Pulmonary:     Effort: Pulmonary effort is normal.  Genitourinary:    General: Normal vulva.     Exam position: Lithotomy position.     Pubic Area: Rash present.     Vagina: Vaginal discharge present.     Comments: Significant Yeastlike rash of crural and inguinal folds Musculoskeletal:        General: Normal range of motion.     Cervical back: Normal range of motion.  Neurological:     General: No focal deficit present.     Mental Status: She is alert.  Psychiatric:        Mood and Affect: Mood normal.        Behavior: Behavior normal.      Assessment and Plan:  Yeast vaginitis -     SureSwab Advanced Candida Vaginitis (CV), TMA -     WET PREP FOR TRICH, YEAST, CLUE  Candidal intertrigo -     Fluconazole ; Take 1 tablet (150 mg total) by  mouth every 3 (three) days for 3 doses.  Dispense: 3 tablet; Refill: 1  Initial wet prep negative for yeast We will treat for intertrigo Candida Follow-up yeast swab, if positive will send 65-month weekly Diflucan  therapy  Romaine Closs, MD

## 2023-10-23 LAB — SURESWAB® ADVANCED CANDIDA VAGINITIS (CV), TMA
CANDIDA SPECIES: NOT DETECTED
Candida glabrata: NOT DETECTED

## 2023-11-14 DIAGNOSIS — G4733 Obstructive sleep apnea (adult) (pediatric): Secondary | ICD-10-CM | POA: Diagnosis not present

## 2023-11-14 DIAGNOSIS — G4731 Primary central sleep apnea: Secondary | ICD-10-CM | POA: Diagnosis not present

## 2023-11-15 ENCOUNTER — Other Ambulatory Visit: Payer: Self-pay

## 2023-11-15 ENCOUNTER — Encounter: Payer: Self-pay | Admitting: Internal Medicine

## 2023-11-15 ENCOUNTER — Other Ambulatory Visit (HOSPITAL_COMMUNITY): Payer: Self-pay

## 2023-11-19 ENCOUNTER — Telehealth: Payer: Self-pay | Admitting: Adult Health

## 2023-11-19 NOTE — Telephone Encounter (Signed)
 VM box full, sent mychart msg informing pt of need to reschedule 08/12/24 appt - NP out

## 2023-11-20 ENCOUNTER — Other Ambulatory Visit (HOSPITAL_COMMUNITY): Payer: Self-pay

## 2023-12-10 ENCOUNTER — Encounter: Payer: Self-pay | Admitting: Internal Medicine

## 2023-12-10 NOTE — Progress Notes (Deleted)
    Subjective:    Patient ID: Erin [name], female    DOB: January 18, 1966, 58 y.o.   MRN: [id number]      HPI Erin [name] is here for No chief complaint on file.   Elevated BP  -    Not feeling well      Medications and allergies reviewed with patient and updated if appropriate.  Current Outpatient Medications on File Prior to Visit  Medication Sig Dispense Refill   ALPRAZolam  (XANAX ) 0.5 MG tablet Take 1 tablet (0.5 mg total) by mouth 2 (two) times daily as needed for anxiety. 60 tablet 0   Blood Glucose Monitoring Suppl (FREESTYLE LITE) w/Device KIT Use as directed. 1 kit 0   cholecalciferol (VITAMIN D) 1000 units tablet Take 1,000 Units by mouth daily.     clotrimazole  (LOTRIMIN ) 1 % cream Apply 1 application topically 2 (two) times daily. Use for 2 weeks at a time as needed. 30 g 2   diltiazem  (TIAZAC ) 120 MG 24 hr capsule Take 1 capsule (120 mg total) by mouth daily. 90 capsule 3   docusate sodium  (COLACE) 100 MG capsule Take 1-3 capsules (100-300 mg total) by mouth daily as needed for mild constipation. 90 capsule 5   empagliflozin  (JARDIANCE ) 25 MG TABS tablet Take 1 tablet (25 mg total) by mouth daily before breakfast. 30 tablet 5   famotidine  (PEPCID ) 20 MG tablet Take 1 tablet (20 mg total) by mouth 2 (two) times daily. 180 tablet 3   glucose blood (FREESTYLE LITE) test strip Use as instructed 100 each 12   Lancets (FREESTYLE) lancets Use as instructed 100 each 12   losartan  (COZAAR ) 100 MG tablet Take 1 tablet (100 mg total) by mouth daily. 90 tablet 3   meloxicam  (MOBIC ) 15 MG tablet Take 1 tablet (15 mg total) by mouth daily. 30 tablet 3   methocarbamol  (ROBAXIN ) 750 MG tablet Take 1 tablet (750 mg total) by mouth 4 (four) times daily. 120 tablet 2   Multiple Vitamin (MULTIVITAMIN) tablet Take 1 tablet by mouth daily.     nystatin  (MYCOSTATIN /NYSTOP ) powder Apply 1 Application topically 3 (three) times daily. Apply to affected area for up to 7 days. 30 g 2    ondansetron  (ZOFRAN -ODT) 4 MG disintegrating tablet Take 1 tablet (4 mg total) by mouth every 8 (eight) hours as needed for nausea or vomiting. 20 tablet 0   rosuvastatin  (CRESTOR ) 10 MG tablet Take 1 tablet (10 mg total) by mouth daily. 90 tablet 3   Semaglutide  (RYBELSUS ) 7 MG TABS Take 1 tablet (7 mg total) by mouth daily. 30 tablet 5   UNITHROID  137 MCG tablet Take 1 tablet (137 mcg total) by mouth daily before breakfast. 45 tablet 6   No current facility-administered medications on file prior to visit.    Review of Systems     Objective:  There were no vitals filed for this visit. BP Readings from Last 3 Encounters:  10/22/23 118/74  10/14/23 138/70  08/07/23 (!) 133/96   Wt Readings from Last 3 Encounters:  10/22/23 (!) 346 lb (156.9 kg)  10/14/23 (!) 347 lb (157.4 kg)  08/07/23 (!) 342 lb 12.8 oz (155.5 kg)   There is no height or weight on file to calculate BMI.    Physical Exam         Assessment & Plan:    See Problem List for Assessment and Plan of chronic medical problems.

## 2023-12-11 ENCOUNTER — Telehealth: Payer: Self-pay | Admitting: *Deleted

## 2023-12-11 ENCOUNTER — Emergency Department (HOSPITAL_BASED_OUTPATIENT_CLINIC_OR_DEPARTMENT_OTHER)
Admission: EM | Admit: 2023-12-11 | Discharge: 2023-12-11 | Disposition: A | Discharge status: Home / Self Care | Attending: Emergency Medicine | Admitting: Emergency Medicine

## 2023-12-11 ENCOUNTER — Other Ambulatory Visit: Payer: Self-pay

## 2023-12-11 ENCOUNTER — Ambulatory Visit: Admitting: Internal Medicine

## 2023-12-11 DIAGNOSIS — E119 Type 2 diabetes mellitus without complications: Secondary | ICD-10-CM | POA: Diagnosis not present

## 2023-12-11 DIAGNOSIS — R448 Other symptoms and signs involving general sensations and perceptions: Secondary | ICD-10-CM | POA: Diagnosis not present

## 2023-12-11 DIAGNOSIS — R449 Unspecified symptoms and signs involving general sensations and perceptions: Secondary | ICD-10-CM

## 2023-12-11 DIAGNOSIS — Z8616 Personal history of COVID-19: Secondary | ICD-10-CM | POA: Diagnosis not present

## 2023-12-11 DIAGNOSIS — I10 Essential (primary) hypertension: Secondary | ICD-10-CM | POA: Insufficient documentation

## 2023-12-11 DIAGNOSIS — E039 Hypothyroidism, unspecified: Secondary | ICD-10-CM | POA: Insufficient documentation

## 2023-12-11 DIAGNOSIS — R0789 Other chest pain: Secondary | ICD-10-CM | POA: Diagnosis not present

## 2023-12-11 LAB — CBC
HCT: 37.4 % (ref 36.0–46.0)
Hemoglobin: 12.1 g/dL (ref 12.0–15.0)
MCH: 27.6 pg (ref 26.0–34.0)
MCHC: 32.4 g/dL (ref 30.0–36.0)
MCV: 85.4 fL (ref 80.0–100.0)
Platelets: 304 10*3/uL (ref 150–400)
RBC: 4.38 MIL/uL (ref 3.87–5.11)
RDW: 14.1 % (ref 11.5–15.5)
WBC: 13 10*3/uL — ABNORMAL HIGH (ref 4.0–10.5)
nRBC: 0 % (ref 0.0–0.2)

## 2023-12-11 LAB — RESP PANEL BY RT-PCR (RSV, FLU A&B, COVID)  RVPGX2
Influenza A by PCR: NEGATIVE
Influenza B by PCR: NEGATIVE
Resp Syncytial Virus by PCR: NEGATIVE
SARS Coronavirus 2 by RT PCR: NEGATIVE

## 2023-12-11 LAB — COMPREHENSIVE METABOLIC PANEL WITH GFR
ALT: 22 U/L (ref 0–44)
AST: 19 U/L (ref 15–41)
Albumin: 4 g/dL (ref 3.5–5.0)
Alkaline Phosphatase: 228 U/L — ABNORMAL HIGH (ref 38–126)
Anion gap: 13 (ref 5–15)
BUN: 17 mg/dL (ref 6–20)
CO2: 24 mmol/L (ref 22–32)
Calcium: 9.7 mg/dL (ref 8.9–10.3)
Chloride: 106 mmol/L (ref 98–111)
Creatinine, Ser: 0.83 mg/dL (ref 0.44–1.00)
GFR, Estimated: >60 mL/min (ref >60)
Glucose, Bld: 166 mg/dL — ABNORMAL HIGH (ref 70–99)
Potassium: 3.8 mmol/L (ref 3.5–5.1)
Sodium: 142 mmol/L (ref 135–145)
Total Bilirubin: 0.4 mg/dL (ref 0.0–1.2)
Total Protein: 7.3 g/dL (ref 6.5–8.1)

## 2023-12-11 LAB — MAGNESIUM: Magnesium: 2.3 mg/dL (ref 1.7–2.4)

## 2023-12-11 NOTE — Discharge Instructions (Signed)
 You were evaluated in the Emergency Department and after careful evaluation, we did not find any emergent condition requiring admission or further testing in the hospital.  Your exam/testing today is overall reassuring.  Recommend rehydration at home, follow-up with your primary care doctor.  Please return to the Emergency Department if you experience any worsening of your condition.   Thank you for allowing us  to be a part of your care.

## 2023-12-11 NOTE — Telephone Encounter (Signed)
 Patient left message on triage line requesting diflucan  regimen for recurrent yeast.   Last OV 10/22/23 [facility]  Spoke with patient. Patient states she will need diflucan  regimen while on Jardiance . Patient states this was discussed at her last visit. Reports symptoms are not as bad as the last time. Reports soreness in the vagina, burning sensation and external vaginal itching. Uses nystatin  powder externally. Denies vaginal d/c or odor.   Pharmacy confirmed.   Advised I will send to Dr. Andrena Ke to review and our office will f/u with recommendations. Patient agreeable.

## 2023-12-11 NOTE — Telephone Encounter (Signed)
 Spoke with patient, advised per Dr. Andrena Ke. Patient expressed frustration with returning for another appointment. Patient declines to schedule at this time, will call back later.

## 2023-12-11 NOTE — ED Triage Notes (Signed)
 Pt coming in stating she feels like something is squeezing me all over when she lays down to go to sleep. Reports happening for past week. Denies pain, SOB, weakness, fatigue, vomiting, fever. Pt reports mild nausea and diarrhea at baseline d/t taking Jardiance .  NAD noted in triage.

## 2023-12-11 NOTE — Telephone Encounter (Signed)
 Her testing was negative at her 10/22/2023 appointment for yeast.  This was confirmed on new swab.  She will need to present for exam and diagnosis prior to chronic treatment initiation and evaluation for other causes of symptoms.

## 2023-12-11 NOTE — ED Provider Notes (Signed)
 [facility] Emergency Department Provider Note MRN:  [id number]  Arrival date & time: 12/11/23     Chief Complaint   feels like insides squeezing   History of Present Illness   Erin [name] is a 58 y.o. year-old female with a history of diabetes, sleep apnea, hypertension presenting to the ED with chief complaint of squeezing.  3 days of feeling a squeezing sensation to her head, face, chest, abdomen.  Explains that it is not a pain it is a squeezing feeling that is most noticeable at night when she is trying to sleep.  Denies shortness of breath, no fever, no cough, no rash, no tick bites, no numbness or weakness to the arms or legs, no trouble with speech or swallowing, no other complaints.  Review of Systems  A thorough review of systems was obtained and all systems are negative except as noted in the HPI and PMH.   Patient's Health History    Past Medical History:  Diagnosis Date   Abnormal uterine bleeding    Anemia    COVID 12/2018   Depression    Diabetes mellitus without complication (HCC)    gestational   Elevated blood-pressure reading without diagnosis of hypertension    Endometriosis    Fibroids    GERD (gastroesophageal reflux disease)    Graves disease    Grief at loss of child    H/O gestational diabetes mellitus, not currently pregnant 09/28/2015   Hypertension    Hypothyroidism    Interstitial cystitis    Menorrhagia    Oropharyngeal candidiasis 01/16/2018   OSA (obstructive sleep apnea), severe 06/23/2018   Premature ventricular contractions    a. rare PVC by monitor 12/2016.   Right ovarian cyst    3 cm.   Thyroid  disease    Uterine leiomyoma     Past Surgical History:  Procedure Laterality Date   APPENDECTOMY     ARTERY BIOPSY Left 10/25/2017   Procedure: BIOPSY TEMPORAL ARTERY;  Surgeon: Alanda Allegra, MD;  Location: [facility];  Service: General;  Laterality: Left;   CESAREAN SECTION     CHOLECYSTECTOMY     HERNIA REPAIR      incisional   TUMOR REMOVAL  fibroids    Family History  Problem Relation Age of Onset   Hypertension Mother    Cancer Mother    Breast cancer Mother 47   Hypertension Father    Diabetes Father    Cancer Father    Ovarian cancer Maternal Aunt    Thyroid  disease Maternal Aunt        hypothyroid    Social History   Socioeconomic History   Marital status: Married    Spouse name: Not on file   Number of children: Not on file   Years of education: Not on file   Highest education level: Associate degree: occupational, Scientist, product/process development, or vocational program  Occupational History   Not on file  Tobacco Use   Smoking status: Never   Smokeless tobacco: Never  Vaping Use   Vaping status: Never Used  Substance and Sexual Activity   Alcohol use: No   Drug use: No   Sexual activity: Yes    Partners: Male    Birth control/protection: Post-menopausal  Other Topics Concern   Not on file  Social History Narrative   Caffeine: maybe 4 cups/day (tea/coffee)   Social Drivers of Health   Financial Resource Strain: Low Risk  (12/10/2023)   Overall Financial Resource Strain (CARDIA)  Difficulty of Paying Living Expenses: Not hard at all  Food Insecurity: Food Insecurity Present (12/10/2023)   Hunger Vital Sign    Worried About Running Out of Food in the Last Year: Sometimes true    Ran Out of Food in the Last Year: Sometimes true  Transportation Needs: No Transportation Needs (12/10/2023)   PRAPARE - Administrator, Civil Service (Medical): No    Lack of Transportation (Non-Medical): No  Physical Activity: Insufficiently Active (12/10/2023)   Exercise Vital Sign    Days of Exercise per Week: 3 days    Minutes of Exercise per Session: 20 min  Stress: No Stress Concern Present (12/10/2023)   [employer] of Occupational Health - Occupational Stress Questionnaire    Feeling of Stress: Not at all  Social Connections: Moderately Integrated (12/10/2023)   Social Connection and  Isolation Panel    Frequency of Communication with Friends and Family: Once a week    Frequency of Social Gatherings with Friends and Family: Once a week    Attends Religious Services: More than 4 times per year    Active Member of [employer] or Organizations: Yes    Attends Engineer, structural: More than 4 times per year    Marital Status: Married  Catering manager Violence: Not on file     Physical Exam   Vitals:   12/11/23 0118  BP: (!) 144/94  Pulse: 94  Resp: 18  Temp: 98.5 F (36.9 C)  SpO2: 98%    CONSTITUTIONAL: Well-appearing, NAD NEURO/PSYCH:  Alert and oriented x 3, no focal deficits EYES:  eyes equal and reactive ENT/NECK:  no LAD, no JVD CARDIO: Regular rate, well-perfused, normal S1 and S2 PULM:  CTAB no wheezing or rhonchi GI/GU:  non-distended, non-tender MSK/SPINE:  No gross deformities, no edema SKIN:  no rash, atraumatic   *Additional and/or pertinent findings included in MDM below  Diagnostic and Interventional Summary    EKG Interpretation Date/Time:  Wednesday December 11 2023 04:37:37 EDT Ventricular Rate:  97 PR Interval:  168 QRS Duration:  106 QT Interval:  388 QTC Calculation: 493 R Axis:   89  Text Interpretation: Sinus rhythm Low voltage with right axis deviation Probable anteroseptal infarct, old Confirmed by Gwenetta Lennert [phone]) on 12/11/2023 5:07:30 AM       Labs Reviewed  CBC - Abnormal; Notable for the following components:      Result Value   WBC 13.0 (*)    All other components within normal limits  COMPREHENSIVE METABOLIC PANEL WITH GFR - Abnormal; Notable for the following components:   Glucose, Bld 166 (*)    Alkaline Phosphatase 228 (*)    All other components within normal limits  RESP PANEL BY RT-PCR (RSV, FLU A&B, COVID)  RVPGX2  MAGNESIUM    No orders to display    Medications - No data to display   Procedures  /  Critical Care Procedures  ED Course and Medical Decision Making  Initial Impression and  Ddx Vague symptoms difficult to place in an illness script.  She is well-appearing, talking comfortably, no acute distress, vital signs normal.  Not having any chest pain, abdomen is soft and nontender, denying headache, normal neurological exam.  Overall very low concern for emergent process.  May be having electrolyte disturbance, dehydration, early viral illness.  Screening labs.  Given the sensation of chest squeezing will obtain screening EKG.  Not having chest pain, highly doubt ACS.  Past medical/surgical history that increases complexity  of ED encounter: None  Interpretation of Diagnostics I personally reviewed the EKG and my interpretation is as follows: Sinus rhythm  No significant blood count or electrolyte disturbance.  Patient Reassessment and Ultimate Disposition/Management     Patient explains that she has been having night sweats due to menopause recently and thinks she may be dehydrated.  Appropriate for discharge.  Patient management required discussion with the following services or consulting groups:  None  Complexity of Problems Addressed Acute illness or injury that poses threat of life of bodily function  Additional Data Reviewed and Analyzed Further history obtained from: None  Additional Factors Impacting ED Encounter Risk None  Merrick Abe. Harless Lien, MD [facility] Emergency Medicine [facility] [email or web address]  Final Clinical Impressions(s) / ED Diagnoses     ICD-10-CM   1. Feeling abnormal  R44.9       ED Discharge Orders     None        Discharge Instructions Discussed with and Provided to Patient:     Discharge Instructions      You were evaluated in the Emergency Department and after careful evaluation, we did not find any emergent condition requiring admission or further testing in the hospital.  Your exam/testing today is overall reassuring.  Recommend rehydration at home, follow-up with your primary care  doctor.  Please return to the Emergency Department if you experience any worsening of your condition.   Thank you for allowing us  to be a part of your care.       Edson Graces, MD 12/11/23 [phone]

## 2023-12-14 ENCOUNTER — Other Ambulatory Visit (HOSPITAL_COMMUNITY): Payer: Self-pay

## 2023-12-14 ENCOUNTER — Other Ambulatory Visit (HOSPITAL_BASED_OUTPATIENT_CLINIC_OR_DEPARTMENT_OTHER): Payer: Self-pay

## 2023-12-14 MED ORDER — ALPRAZOLAM 0.5 MG PO TABS
0.5000 mg | ORAL_TABLET | Freq: Two times a day (BID) | ORAL | 0 refills | Status: AC | PRN
  Filled 2023-12-14: qty 60, 30d supply, fill #0

## 2023-12-16 ENCOUNTER — Other Ambulatory Visit: Payer: Self-pay

## 2023-12-17 ENCOUNTER — Ambulatory Visit: Admitting: Obstetrics and Gynecology

## 2023-12-17 NOTE — Progress Notes (Signed)
 58 y.o. [id number] female here for vaginal symptoms. Married.  No LMP recorded (lmp unknown). Patient is postmenopausal.   She reports vaginal burning with wiping x 3 days. Denies discharge or odor.  Took Diflucan  twice last week.  Symptoms still present. Recurrent yeast vaginitis symptoms on Jardiance .  Also with significant candidal intertrigo. See prior notes.  Birth control: postmenopausal Last mammogram: 11/16/20 BI-RADS 1 Sexually active: Yes   OB History  Gravida Para Term Preterm AB Living  9 9 9   7   SAB IAB Ectopic Multiple Live Births      9    # Outcome Date GA Lbr Len/2nd Weight Sex Type Anes PTL Lv  9 Term           8 Term           7 Term           6 Term           5 Term           4 Term           3 Term           2 Term           1 Term            Past Medical History:  Diagnosis Date   Abnormal uterine bleeding    Anemia    COVID 12/2018   Depression    Diabetes mellitus without complication (HCC)    gestational   Elevated blood-pressure reading without diagnosis of hypertension    Endometriosis    Fibroids    GERD (gastroesophageal reflux disease)    Graves disease    Grief at loss of child    H/O gestational diabetes mellitus, not currently pregnant 09/28/2015   Hypertension    Hypothyroidism    Interstitial cystitis    Menorrhagia    Oropharyngeal candidiasis 01/16/2018   OSA (obstructive sleep apnea), severe 06/23/2018   Premature ventricular contractions    a. rare PVC by monitor 12/2016.   Right ovarian cyst    3 cm.   Thyroid  disease    Uterine leiomyoma    Past Surgical History:  Procedure Laterality Date   APPENDECTOMY     ARTERY BIOPSY Left 10/25/2017   Procedure: BIOPSY TEMPORAL ARTERY;  Surgeon: [name], MD;  Location: [facility];  Service: General;  Laterality: Left;   CESAREAN SECTION     CHOLECYSTECTOMY     HERNIA REPAIR     incisional   TUMOR REMOVAL  fibroids   Current Outpatient Medications on File Prior to Visit   Medication Sig Dispense Refill   ALPRAZolam  (XANAX ) 0.5 MG tablet Take 1 tablet (0.5 mg total) by mouth 2 (two) times daily as needed for anxiety. 60 tablet 0   Blood Glucose Monitoring Suppl (FREESTYLE LITE) w/Device KIT Use as directed. 1 kit 0   cholecalciferol (VITAMIN D) 1000 units tablet Take 1,000 Units by mouth daily.     clotrimazole  (LOTRIMIN ) 1 % cream Apply 1 application topically 2 (two) times daily. Use for 2 weeks at a time as needed. 30 g 2   diltiazem  (TIAZAC ) 120 MG 24 hr capsule Take 1 capsule (120 mg total) by mouth daily. 90 capsule 3   docusate sodium  (COLACE) 100 MG capsule Take 1-3 capsules (100-300 mg total) by mouth daily as needed for mild constipation. 90 capsule 5   empagliflozin  (JARDIANCE ) 25 MG TABS tablet Take 1 tablet (25  mg total) by mouth daily before breakfast. 30 tablet 5   famotidine  (PEPCID ) 20 MG tablet Take 1 tablet (20 mg total) by mouth 2 (two) times daily. 180 tablet 3   glucose blood (FREESTYLE LITE) test strip Use as instructed 100 each 12   Lancets (FREESTYLE) lancets Use as instructed 100 each 12   losartan  (COZAAR ) 100 MG tablet Take 1 tablet (100 mg total) by mouth daily. 90 tablet 3   meloxicam  (MOBIC ) 15 MG tablet Take 1 tablet (15 mg total) by mouth daily. 30 tablet 3   methocarbamol  (ROBAXIN ) 750 MG tablet Take 1 tablet (750 mg total) by mouth 4 (four) times daily. 120 tablet 2   Multiple Vitamin (MULTIVITAMIN) tablet Take 1 tablet by mouth daily.     nystatin  (MYCOSTATIN /NYSTOP ) powder Apply 1 Application topically 3 (three) times daily. Apply to affected area for up to 7 days. 30 g 2   ondansetron  (ZOFRAN -ODT) 4 MG disintegrating tablet Take 1 tablet (4 mg total) by mouth every 8 (eight) hours as needed for nausea or vomiting. 20 tablet 0   rosuvastatin  (CRESTOR ) 10 MG tablet Take 1 tablet (10 mg total) by mouth daily. 90 tablet 3   Semaglutide  (RYBELSUS ) 7 MG TABS Take 1 tablet (7 mg total) by mouth daily. 30 tablet 5   UNITHROID  137 MCG  tablet Take 1 tablet (137 mcg total) by mouth daily before breakfast. 45 tablet 6   No current facility-administered medications on file prior to visit.   Allergies  Allergen Reactions   Prednisone  Other (See Comments)    Raises blood sugar so high that she receives intensive care   Trulicity  [Dulaglutide ] Diarrhea and Nausea And Vomiting   Fluoxetine     Glimepiride      Stomach upset   Prozac  [Fluoxetine  Hcl] Nausea Only   Hydrocodone Hives, Itching and Rash    On thighs    Metformin  And Related     diarrhea      PE Today's Vitals   12/18/23 1609  BP: 112/62  Pulse: (!) 111  Temp: 98 F (36.7 C)  TempSrc: Oral  SpO2: 96%  Weight: (!) 344 lb (156 kg)   Body mass index is 57.24 kg/m.  Physical Exam Vitals reviewed. Exam conducted with a chaperone present.  Constitutional:      General: She is not in acute distress.    Appearance: Normal appearance.  HENT:     Head: Normocephalic and atraumatic.     Nose: Nose normal.   Eyes:     Extraocular Movements: Extraocular movements intact.     Conjunctiva/sclera: Conjunctivae normal.   Pulmonary:     Effort: Pulmonary effort is normal.  Genitourinary:    General: Normal vulva.     Exam position: Lithotomy position.     Pubic Area: Rash present.     Vagina: Vaginal discharge present.     Comments: Significant Yeastlike rash of crural and inguinal folds  Musculoskeletal:        General: Normal range of motion.     Cervical back: Normal range of motion.   Neurological:     General: No focal deficit present.     Mental Status: She is alert.   Psychiatric:        Mood and Affect: Mood normal.        Behavior: Behavior normal.       Assessment and Plan:        Burning with urination -     Urinalysis,Complete w/RFL Culture -  WET PREP FOR TRICH, YEAST, CLUE  Yeast vaginitis -     Clotrimazole -Betamethasone ; Apply 1 Application topically 2 (two) times daily as needed for up to 14 days. To vulva and  perineum  Dispense: 45 g; Refill: 3 -     Fluconazole ; Take 1 tablet (150 mg total) by mouth every 3 (three) days for 3 doses.  Dispense: 3 tablet; Refill: 3 -     Fluconazole ; Take 1 tablet (150 mg total) by mouth once a week for 24 doses. Start after taking 1 tablet every 3 days for 1 week.  Dispense: 12 tablet; Refill: 1 -     SureSwab Advanced Candida Vaginitis (CV), TMA  Genitourinary syndrome of menopause -     Estradiol ; Apply 1/2 gram to vulva nightly for 2 weeks then decrease to 1/2 gram to vulva two nights a week. Do not use applicator.  Dispense: 42.5 g; Refill: 1  Other orders -     Urine Culture -     REFLEXIVE URINE CULTURE    Vera LULLA Pa, MD

## 2023-12-18 ENCOUNTER — Encounter: Payer: Self-pay | Admitting: Obstetrics and Gynecology

## 2023-12-18 ENCOUNTER — Ambulatory Visit: Admitting: Obstetrics and Gynecology

## 2023-12-18 ENCOUNTER — Other Ambulatory Visit (HOSPITAL_COMMUNITY): Payer: Self-pay

## 2023-12-18 VITALS — BP 112/62 | HR 111 | Temp 98.0°F | Wt 344.0 lb

## 2023-12-18 DIAGNOSIS — R300 Dysuria: Secondary | ICD-10-CM | POA: Diagnosis not present

## 2023-12-18 DIAGNOSIS — N958 Other specified menopausal and perimenopausal disorders: Secondary | ICD-10-CM | POA: Diagnosis not present

## 2023-12-18 DIAGNOSIS — B3731 Acute candidiasis of vulva and vagina: Secondary | ICD-10-CM | POA: Diagnosis not present

## 2023-12-18 LAB — URINALYSIS, COMPLETE W/RFL CULTURE
Hyaline Cast: NONE SEEN /LPF
Leukocyte Esterase: NEGATIVE
Protein, ur: NEGATIVE

## 2023-12-18 MED ORDER — ESTRADIOL 0.1 MG/GM VA CREA
TOPICAL_CREAM | VAGINAL | 1 refills | Status: AC
  Filled 2023-12-18: qty 42.5, 90d supply, fill #0

## 2023-12-18 MED ORDER — FLUCONAZOLE 150 MG PO TABS
150.0000 mg | ORAL_TABLET | ORAL | 1 refills | Status: AC
  Filled 2023-12-18 – 2024-01-09 (×2): qty 12, 84d supply, fill #0
  Filled 2024-03-18: qty 12, 84d supply, fill #1

## 2023-12-18 MED ORDER — CLOTRIMAZOLE-BETAMETHASONE 1-0.05 % EX CREA
1.0000 | TOPICAL_CREAM | Freq: Two times a day (BID) | CUTANEOUS | 3 refills | Status: AC | PRN
  Filled 2023-12-18: qty 45, 23d supply, fill #0

## 2023-12-18 MED ORDER — FLUCONAZOLE 150 MG PO TABS
150.0000 mg | ORAL_TABLET | ORAL | 3 refills | Status: AC
  Filled 2023-12-18: qty 3, 9d supply, fill #0

## 2023-12-19 ENCOUNTER — Other Ambulatory Visit: Payer: Self-pay

## 2023-12-19 ENCOUNTER — Other Ambulatory Visit (HOSPITAL_COMMUNITY): Payer: Self-pay

## 2023-12-19 ENCOUNTER — Ambulatory Visit: Payer: Self-pay | Admitting: Obstetrics and Gynecology

## 2023-12-19 DIAGNOSIS — B3731 Acute candidiasis of vulva and vagina: Secondary | ICD-10-CM | POA: Diagnosis not present

## 2023-12-19 DIAGNOSIS — R300 Dysuria: Secondary | ICD-10-CM | POA: Diagnosis not present

## 2023-12-19 LAB — WET PREP FOR TRICH, YEAST, CLUE

## 2023-12-20 LAB — URINALYSIS, COMPLETE W/RFL CULTURE
Bilirubin Urine: NEGATIVE
Nitrites, Initial: NEGATIVE
RBC / HPF: NONE SEEN /HPF (ref 0–2)
Specific Gravity, Urine: 1.025 (ref 1.001–1.035)
pH: 5.5 (ref 5.0–8.0)

## 2023-12-20 LAB — URINE CULTURE
MICRO NUMBER:: 16623655
SPECIMEN QUALITY:: ADEQUATE

## 2023-12-20 LAB — CULTURE INDICATED

## 2023-12-20 LAB — SURESWAB® ADVANCED CANDIDA VAGINITIS (CV), TMA
CANDIDA SPECIES: NOT DETECTED
Candida glabrata: NOT DETECTED

## 2023-12-24 ENCOUNTER — Encounter: Payer: Self-pay | Admitting: Obstetrics and Gynecology

## 2023-12-30 ENCOUNTER — Other Ambulatory Visit (HOSPITAL_COMMUNITY): Payer: Self-pay

## 2024-01-09 ENCOUNTER — Other Ambulatory Visit: Payer: Self-pay

## 2024-01-09 ENCOUNTER — Other Ambulatory Visit (HOSPITAL_COMMUNITY): Payer: Self-pay

## 2024-01-15 ENCOUNTER — Encounter: Payer: Self-pay | Admitting: Internal Medicine

## 2024-01-17 ENCOUNTER — Other Ambulatory Visit: Payer: Self-pay | Admitting: Internal Medicine

## 2024-01-17 ENCOUNTER — Other Ambulatory Visit (HOSPITAL_COMMUNITY): Payer: Self-pay

## 2024-01-17 MED ORDER — EMPAGLIFLOZIN 25 MG PO TABS
25.0000 mg | ORAL_TABLET | Freq: Every day | ORAL | 5 refills | Status: DC
  Filled 2024-01-17: qty 30, 30d supply, fill #0
  Filled 2024-02-17: qty 30, 30d supply, fill #1
  Filled 2024-03-18: qty 30, 30d supply, fill #2
  Filled 2024-04-20: qty 30, 30d supply, fill #3
  Filled 2024-05-18: qty 30, 30d supply, fill #4
  Filled 2024-06-17: qty 30, 30d supply, fill #5

## 2024-01-20 ENCOUNTER — Encounter: Payer: Self-pay | Admitting: Internal Medicine

## 2024-01-22 ENCOUNTER — Other Ambulatory Visit: Payer: Self-pay | Admitting: Internal Medicine

## 2024-01-22 ENCOUNTER — Other Ambulatory Visit (HOSPITAL_COMMUNITY): Payer: Self-pay

## 2024-01-22 MED ORDER — FREESTYLE LANCETS MISC
Freq: Every day | 12 refills | Status: AC
  Filled 2024-01-22 – 2024-03-18 (×2): qty 100, 90d supply, fill #0

## 2024-01-22 MED ORDER — FREESTYLE LITE W/DEVICE KIT
PACK | 0 refills | Status: AC
  Filled 2024-01-22: qty 1, 90d supply, fill #0
  Filled 2024-01-22: qty 1, fill #0
  Filled 2024-03-18: qty 1, 30d supply, fill #0

## 2024-01-22 MED ORDER — FREESTYLE LITE TEST VI STRP
ORAL_STRIP | 12 refills | Status: AC
  Filled 2024-01-22: qty 50, 50d supply, fill #0
  Filled 2024-01-22: qty 100, fill #0
  Filled 2024-03-18: qty 100, 90d supply, fill #0

## 2024-01-23 ENCOUNTER — Encounter: Payer: Self-pay | Admitting: Internal Medicine

## 2024-01-23 NOTE — Progress Notes (Unsigned)
 Subjective:    Patient ID: Erin [name], female    DOB: 1966-04-25, 58 y.o.   MRN: [id number]     HPI Sakiya is here for follow up from the ED.   6/18 - ED - felt like insides were squeezing.  4 days of sensation of squeezing in head, face, chest and abdomen.  No pain.  Most noticeable at night when trying to sleep.  No SOB, fever, cough, n/t or weakness.   Resp panel neg.  WBC 13 - stable.  Alk phos elevated.  Other labs stable. EKG normal.  Saw her gyn - no evidence of UTI.    Sugar at home has been good.  She sweats at night and during the day  Drinking pedialyte.  Feels better.  No longer feeling squeezing sensation.    Walking for exercise - if she goes too far she will hyperventilate   Medications and allergies reviewed with patient and updated if appropriate.  Current Outpatient Medications on File Prior to Visit  Medication Sig Dispense Refill   ALPRAZolam  (XANAX ) 0.5 MG tablet Take 1 tablet (0.5 mg total) by mouth 2 (two) times daily as needed for anxiety. 60 tablet 0   Blood Glucose Monitoring Suppl (FREESTYLE LITE) w/Device KIT Use as directed 1 kit 0   cholecalciferol (VITAMIN D) 1000 units tablet Take 1,000 Units by mouth daily.     clotrimazole  (LOTRIMIN ) 1 % cream Apply 1 application topically 2 (two) times daily. Use for 2 weeks at a time as needed. 30 g 2   diltiazem  (TIAZAC ) 120 MG 24 hr capsule Take 1 capsule (120 mg total) by mouth daily. 90 capsule 3   docusate sodium  (COLACE) 100 MG capsule Take 1-3 capsules (100-300 mg total) by mouth daily as needed for mild constipation. 90 capsule 5   empagliflozin  (JARDIANCE ) 25 MG TABS tablet Take 1 tablet (25 mg total) by mouth daily before breakfast. 30 tablet 5   estradiol  (ESTRACE  VAGINAL) 0.1 MG/GM vaginal cream Apply 1/2 gram to vulva nightly for 2 weeks then decrease to 1/2 gram to vulva two nights a week. Do not use applicator. 42.5 g 1   famotidine  (PEPCID ) 20 MG tablet Take 1 tablet (20 mg total) by  mouth 2 (two) times daily. 180 tablet 3   fluconazole  (DIFLUCAN ) 150 MG tablet Take 1 tablet (150 mg total) by mouth once a week for 24 doses. Start after taking 1 tablet every 3 days for 1 week. 12 tablet 1   glucose blood (FREESTYLE LITE) test strip Use to check blood glucose once daily and if needed. 100 each 12   Lancets (FREESTYLE) lancets Use as instructed to check sugars daily and as needed. 100 each 12   losartan  (COZAAR ) 100 MG tablet Take 1 tablet (100 mg total) by mouth daily. 90 tablet 3   meloxicam  (MOBIC ) 15 MG tablet Take 1 tablet (15 mg total) by mouth daily. 30 tablet 3   methocarbamol  (ROBAXIN ) 750 MG tablet Take 1 tablet (750 mg total) by mouth 4 (four) times daily. 120 tablet 2   Multiple Vitamin (MULTIVITAMIN) tablet Take 1 tablet by mouth daily.     nystatin  (MYCOSTATIN /NYSTOP ) powder Apply 1 Application topically 3 (three) times daily. Apply to affected area for up to 7 days. 30 g 2   ondansetron  (ZOFRAN -ODT) 4 MG disintegrating tablet Take 1 tablet (4 mg total) by mouth every 8 (eight) hours as needed for nausea or vomiting. 20 tablet 0   rosuvastatin  (  CRESTOR ) 10 MG tablet Take 1 tablet (10 mg total) by mouth daily. 90 tablet 3   Semaglutide  (RYBELSUS ) 7 MG TABS Take 1 tablet (7 mg total) by mouth daily. 30 tablet 5   UNITHROID  137 MCG tablet Take 1 tablet (137 mcg total) by mouth daily before breakfast. 45 tablet 6   No current facility-administered medications on file prior to visit.     Review of Systems  Constitutional:  Positive for diaphoresis.  Respiratory:  Positive for shortness of breath. Negative for cough and wheezing.   Cardiovascular:  Positive for chest pain (sharp pain intermittent) and palpitations (intermittent). Negative for leg swelling.  Gastrointestinal:  Positive for vomiting (when she was having symptoms). Negative for nausea.  Neurological:  Positive for dizziness (when she was having symptoms), light-headedness (when she was having symptoms)  and headaches.       Objective:   Vitals:   01/24/24 1121  BP: 138/88  Pulse: 78  Temp: 98.6 F (37 C)  SpO2: 98%   BP Readings from Last 3 Encounters:  01/24/24 138/88  12/18/23 112/62  12/11/23 (!) 144/94   Wt Readings from Last 3 Encounters:  01/24/24 (!) 346 lb (156.9 kg)  12/18/23 (!) 344 lb (156 kg)  12/11/23 (!) 345 lb (156.5 kg)   Body mass index is 57.58 kg/m.    Physical Exam Constitutional:      General: She is not in acute distress.    Appearance: Normal appearance.  HENT:     Head: Normocephalic and atraumatic.  Eyes:     Conjunctiva/sclera: Conjunctivae normal.  Cardiovascular:     Rate and Rhythm: Normal rate and regular rhythm.     Heart sounds: Normal heart sounds.  Pulmonary:     Effort: Pulmonary effort is normal. No respiratory distress.     Breath sounds: Normal breath sounds. No wheezing.  Musculoskeletal:     Cervical back: Neck supple.     Right lower leg: No edema.     Left lower leg: No edema.  Lymphadenopathy:     Cervical: No cervical adenopathy.  Skin:    General: Skin is warm and dry.     Findings: No rash.  Neurological:     Mental Status: She is alert. Mental status is at baseline.  Psychiatric:        Mood and Affect: Mood normal.        Behavior: Behavior normal.        Lab Results  Component Value Date   WBC 13.0 (H) 12/11/2023   HGB 12.1 12/11/2023   HCT 37.4 12/11/2023   PLT 304 12/11/2023   GLUCOSE 166 (H) 12/11/2023   CHOL 132 07/03/2023   TRIG 89.0 07/03/2023   HDL 41.30 07/03/2023   LDLCALC 73 07/03/2023   ALT 22 12/11/2023   AST 19 12/11/2023   NA 142 12/11/2023   K 3.8 12/11/2023   CL 106 12/11/2023   CREATININE 0.83 12/11/2023   BUN 17 12/11/2023   CO2 24 12/11/2023   TSH 0.23 (L) 07/03/2023   HGBA1C 7.2 (A) 10/14/2023   HGBA1C 7.2 10/14/2023   HGBA1C 7.2 (A) 10/14/2023   HGBA1C 7.2 (A) 10/14/2023     Assessment & Plan:    See Problem List for Assessment and Plan of chronic medical  problems.

## 2024-01-24 ENCOUNTER — Ambulatory Visit: Admitting: Internal Medicine

## 2024-01-24 ENCOUNTER — Ambulatory Visit: Payer: Self-pay | Admitting: Internal Medicine

## 2024-01-24 VITALS — BP 132/78 | HR 78 | Temp 98.6°F | Ht 65.0 in | Wt 346.0 lb

## 2024-01-24 DIAGNOSIS — E890 Postprocedural hypothyroidism: Secondary | ICD-10-CM | POA: Diagnosis not present

## 2024-01-24 DIAGNOSIS — F419 Anxiety disorder, unspecified: Secondary | ICD-10-CM | POA: Diagnosis not present

## 2024-01-24 DIAGNOSIS — Z7984 Long term (current) use of oral hypoglycemic drugs: Secondary | ICD-10-CM

## 2024-01-24 DIAGNOSIS — E782 Mixed hyperlipidemia: Secondary | ICD-10-CM

## 2024-01-24 DIAGNOSIS — R449 Unspecified symptoms and signs involving general sensations and perceptions: Secondary | ICD-10-CM | POA: Diagnosis not present

## 2024-01-24 DIAGNOSIS — K219 Gastro-esophageal reflux disease without esophagitis: Secondary | ICD-10-CM

## 2024-01-24 DIAGNOSIS — I10 Essential (primary) hypertension: Secondary | ICD-10-CM | POA: Diagnosis not present

## 2024-01-24 DIAGNOSIS — E1165 Type 2 diabetes mellitus with hyperglycemia: Secondary | ICD-10-CM | POA: Diagnosis not present

## 2024-01-24 LAB — COMPREHENSIVE METABOLIC PANEL WITH GFR
ALT: 19 U/L (ref 0–35)
AST: 14 U/L (ref 0–37)
Albumin: 4.1 g/dL (ref 3.5–5.2)
Alkaline Phosphatase: 199 U/L — ABNORMAL HIGH (ref 39–117)
BUN: 18 mg/dL (ref 6–23)
CO2: 26 meq/L (ref 19–32)
Calcium: 9.6 mg/dL (ref 8.4–10.5)
Chloride: 107 meq/L (ref 96–112)
Creatinine, Ser: 0.78 mg/dL (ref 0.40–1.20)
GFR: 84.26 mL/min (ref >60.00)
Glucose, Bld: 110 mg/dL — ABNORMAL HIGH (ref 70–99)
Potassium: 4 meq/L (ref 3.5–5.1)
Sodium: 141 meq/L (ref 135–145)
Total Bilirubin: 0.6 mg/dL (ref 0.2–1.2)
Total Protein: 7.7 g/dL (ref 6.0–8.3)

## 2024-01-24 LAB — LIPID PANEL
Cholesterol: 141 mg/dL (ref 0–200)
HDL: 42.7 mg/dL (ref >39.00)
LDL Cholesterol: 79 mg/dL (ref 0–99)
NonHDL: 98.73
Total CHOL/HDL Ratio: 3
Triglycerides: 100 mg/dL (ref 0.0–149.0)
VLDL: 20 mg/dL (ref 0.0–40.0)

## 2024-01-24 LAB — MICROALBUMIN / CREATININE URINE RATIO
Creatinine,U: 96.1 mg/dL
Microalb Creat Ratio: UNDETERMINED mg/g (ref 0.0–30.0)
Microalb, Ur: <0.7 mg/dL

## 2024-01-24 LAB — TSH: TSH: 0.55 u[IU]/mL (ref 0.35–5.50)

## 2024-01-24 LAB — T4, FREE: Free T4: 1.05 ng/dL (ref 0.60–1.60)

## 2024-01-24 LAB — HEMOGLOBIN A1C: Hgb A1c MFr Bld: 7.8 % — ABNORMAL HIGH (ref 4.6–6.5)

## 2024-01-24 NOTE — Assessment & Plan Note (Signed)
 Chronic  Lab Results  Component Value Date   HGBA1C 7.2 (A) 10/14/2023   HGBA1C 7.2 10/14/2023   HGBA1C 7.2 (A) 10/14/2023   HGBA1C 7.2 (A) 10/14/2023   Check A1c, urine albumin/creatinine ratio today  Continue Rybelsus   3.5 mg daily -cutting 7 mg in half and tolerating it-did not tolerate 7 mg Continue Jardiance  25 mg daily Stressed regular exercise, diabetic diet Continue working on weight loss

## 2024-01-24 NOTE — Assessment & Plan Note (Signed)
 Chronic Regular exercise and healthy diet encouraged Lab Results  Component Value Date   LDLCALC 73 07/03/2023    Continue Crestor  10 mg daily

## 2024-01-24 NOTE — Assessment & Plan Note (Signed)
 Chronic Blood pressure improved on repeat Working on weight loss - stressed exercise and working on diet Continue diltiazem  120 mg daily, losartan  to 100 mg daily

## 2024-01-24 NOTE — Assessment & Plan Note (Signed)
 Chronic Discussed the importance of weight loss She is limited on how much exercise she can do because of knee arthritis-encouraged her to do as much she can Decrease sugars and carbs Continue Rybelsus  3.5 mg daily

## 2024-01-24 NOTE — Assessment & Plan Note (Signed)
 Acute Recent ED visit for not feeling normal-had a squeezing sensation feeling when she was laying down going to bed She thought she may have been a little dehydrated although blood work did not show that Workup in the ED reassuring She did start drinking Pedialyte and that has helped Symptoms have resolved She will continue to drink Pedialyte in addition to plenty of water

## 2024-01-24 NOTE — Assessment & Plan Note (Signed)
Chronic Controlled Continue famotidine 20 mg twice daily

## 2024-01-24 NOTE — Patient Instructions (Addendum)
    Blood work today    Medications changes include :   None       Return in about 6 months (around 07/26/2024) for Physical Exam --- Cancel 02/14/24 appt.

## 2024-01-24 NOTE — Assessment & Plan Note (Signed)
 Chronic Controlled, but does have high stress still Continue alprazolam 0.5 mg twice daily as needed-she does not take often.

## 2024-01-24 NOTE — Assessment & Plan Note (Signed)
 Chronic Management per Dr. Trixie Will check TSH, free T4 since she is getting blood work done today and have them forwarded to Dr. Trixie

## 2024-01-31 ENCOUNTER — Encounter: Payer: Self-pay | Admitting: Internal Medicine

## 2024-02-03 ENCOUNTER — Other Ambulatory Visit (HOSPITAL_COMMUNITY): Payer: Self-pay

## 2024-02-12 ENCOUNTER — Other Ambulatory Visit: Payer: Self-pay

## 2024-02-12 ENCOUNTER — Other Ambulatory Visit: Payer: Self-pay | Admitting: Internal Medicine

## 2024-02-12 ENCOUNTER — Encounter: Payer: Self-pay | Admitting: Pharmacist

## 2024-02-12 ENCOUNTER — Other Ambulatory Visit (HOSPITAL_COMMUNITY): Payer: Self-pay

## 2024-02-12 ENCOUNTER — Encounter: Payer: Self-pay | Admitting: Internal Medicine

## 2024-02-12 ENCOUNTER — Other Ambulatory Visit (HOSPITAL_BASED_OUTPATIENT_CLINIC_OR_DEPARTMENT_OTHER): Payer: Self-pay

## 2024-02-12 MED ORDER — DILTIAZEM HCL ER BEADS 120 MG PO CP24
120.0000 mg | ORAL_CAPSULE | Freq: Every day | ORAL | 3 refills | Status: AC
  Filled 2024-02-12: qty 90, 90d supply, fill #0
  Filled 2024-05-14: qty 90, 90d supply, fill #1
  Filled 2024-05-15: qty 30, 30d supply, fill #1
  Filled 2024-06-17: qty 30, 30d supply, fill #2
  Filled 2024-07-14: qty 90, 90d supply, fill #0
  Filled 2024-07-14: qty 90, 90d supply, fill #3

## 2024-02-13 ENCOUNTER — Other Ambulatory Visit (HOSPITAL_COMMUNITY): Payer: Self-pay

## 2024-02-13 ENCOUNTER — Encounter: Payer: Self-pay | Admitting: Internal Medicine

## 2024-02-14 ENCOUNTER — Ambulatory Visit: Admitting: Internal Medicine

## 2024-02-14 ENCOUNTER — Other Ambulatory Visit (HOSPITAL_COMMUNITY): Payer: Self-pay

## 2024-02-16 DIAGNOSIS — G4733 Obstructive sleep apnea (adult) (pediatric): Secondary | ICD-10-CM | POA: Diagnosis not present

## 2024-02-16 DIAGNOSIS — G4731 Primary central sleep apnea: Secondary | ICD-10-CM | POA: Diagnosis not present

## 2024-02-20 ENCOUNTER — Other Ambulatory Visit: Payer: Self-pay | Admitting: Internal Medicine

## 2024-02-20 ENCOUNTER — Other Ambulatory Visit (HOSPITAL_COMMUNITY): Payer: Self-pay

## 2024-02-20 MED ORDER — MELOXICAM 15 MG PO TABS
15.0000 mg | ORAL_TABLET | Freq: Every day | ORAL | 3 refills | Status: DC
  Filled 2024-02-20: qty 30, 30d supply, fill #0
  Filled 2024-03-19: qty 30, 30d supply, fill #1
  Filled 2024-04-20 (×2): qty 30, 30d supply, fill #2
  Filled 2024-05-18 (×2): qty 30, 30d supply, fill #3

## 2024-02-28 ENCOUNTER — Other Ambulatory Visit (HOSPITAL_COMMUNITY): Payer: Self-pay

## 2024-03-18 ENCOUNTER — Other Ambulatory Visit (HOSPITAL_COMMUNITY): Payer: Self-pay

## 2024-03-18 ENCOUNTER — Other Ambulatory Visit: Payer: Self-pay

## 2024-03-18 ENCOUNTER — Other Ambulatory Visit: Payer: Self-pay | Admitting: Internal Medicine

## 2024-03-18 MED ORDER — METHOCARBAMOL 750 MG PO TABS
750.0000 mg | ORAL_TABLET | Freq: Four times a day (QID) | ORAL | 2 refills | Status: AC
  Filled 2024-03-18: qty 120, 30d supply, fill #0
  Filled 2024-06-17: qty 120, 30d supply, fill #1

## 2024-03-20 ENCOUNTER — Encounter: Payer: Self-pay | Admitting: Internal Medicine

## 2024-03-20 ENCOUNTER — Ambulatory Visit (INDEPENDENT_AMBULATORY_CARE_PROVIDER_SITE_OTHER): Payer: 59 | Admitting: Internal Medicine

## 2024-03-20 VITALS — BP 124/80 | HR 100 | Ht 65.0 in | Wt 344.0 lb

## 2024-03-20 DIAGNOSIS — Z8639 Personal history of other endocrine, nutritional and metabolic disease: Secondary | ICD-10-CM

## 2024-03-20 DIAGNOSIS — E890 Postprocedural hypothyroidism: Secondary | ICD-10-CM | POA: Diagnosis not present

## 2024-03-20 LAB — T4, FREE: Free T4: 1.4 ng/dL (ref 0.8–1.8)

## 2024-03-20 LAB — TSH: TSH: 0.25 m[IU]/L — ABNORMAL LOW (ref 0.40–4.50)

## 2024-03-20 NOTE — Patient Instructions (Addendum)
 Please continue Unithroid  137 mcg 6 out of 7 days, but half a tablet in 1 out of 7 days.  Take the thyroid  hormone every day, with water, at least 30 minutes before breakfast, separated by at least 4 hours from: - acid reflux medications - calcium  - iron - multivitamins  Please stop at the lab.  Please come back for a follow-up appointment in 1 year.

## 2024-03-20 NOTE — Progress Notes (Addendum)
 Patient ID: Erin [name], female   DOB: 1965/10/17, 58 y.o.   MRN: [id number]   HPI  Erin [name] is a 58 y.o.-year-old female, initially referred by her PCP, [name], presenting for follow-up for postablative hypothyroidism.  She previously saw [name].   Last visit with me 1 year ago.  She is not usually compliant with appointments.  Interim history: She has hot flushes. Also palpitations -not new, and may be improving. She has dry eyes and occasional heavy eyes. No increased urination, blurry vision.  Reviewed history: Pt. has been dx with hypothyroidism after RAI treatment for Graves' disease in 08/2008 >> on Unithroid  d.a.w.. She had a lot of variability in the TFTs on generic LT4.  In the past she was not taking the medication correctly and we discussed about how to do so.  At last visit, TSH was elevated so I advised her to increase her LT4 dose.  However, she did not return for repeat labs afterwards and, upon questioning, she was still on Unithroid  100 mcg daily...  We increased the dose to 112 mcg daily in 05/2020.   We increased the dose to 137 mcg daily in 03/2021.  We increased the dose to 150 mcg daily in 04/2021.  We decreased the dose to 137 mcg daily in 02/2024.  Now on 137 mcg 6/7 days and 66.5 mcg 1/7 (equivalent of 127 mcg daily).  She is taking this: - missed 1 dose this mo - in am - fasting - at least 30 min from b'fast - no calcium  - no iron - no multivitamins - no PPIs, no H2 blocker - off Biotin  Reviewed her TFTs: Lab Results  Component Value Date   TSH 0.55 01/24/2024   TSH 0.23 (L) 07/03/2023   TSH 0.16 (L) 03/29/2023   TSH 0.09 (L) 03/15/2023   TSH 0.36 12/26/2022   TSH 3.27 10/05/2021   TSH 3.39 07/12/2021   TSH 5.67 (H) 05/24/2021   TSH 11.52 (H) 04/05/2021   TSH 5.98 (H) 06/22/2020   FREET4 1.05 01/24/2024   FREET4 1.11 07/03/2023   FREET4 1.17 03/29/2023   FREET4 1.28 03/15/2023   FREET4 0.92 10/05/2021   FREET4 0.96  07/12/2021   FREET4 0.82 05/24/2021   FREET4 0.79 04/05/2021   FREET4 0.87 06/22/2020   FREET4 0.83 05/16/2018   T3FREE 6.7 (H) 09/04/2007   Antithyroid antibodies: No results found for: THGAB No components found for: TPOAB  Pt denies: - feeling nodules in neck - hoarseness - choking - SOB with lying down She has  dysphagia with dry foods - when eating fast.  She has + FH of thyroid  disorders in: mother, M aunts. No FH of thyroid  cancer. No h/o radiation tx to head or neck. No herbal supplements but she plans to start curcumin. No Biotin use. No recent steroids use.   She also has uncontrolled diabetes, with DKA in 12/2017. She previously wanted me to also start managing this, but she was lost to follow-up for me afterwards.  This is is currently managed by PCP.  She is on: - Rybelsus  7 mg before breakfast - Jardiance  10 >> 25 mg before breakfast Amaryl  caused stomach discomfort.   She had nausea and vomiting with Trulicity  4.5 mg weekly previously.  Reviewed HbA1c levels: Lab Results  Component Value Date   HGBA1C 7.8 (H) 01/24/2024   HGBA1C 7.2 (A) 10/14/2023   HGBA1C 7.2 10/14/2023   HGBA1C 7.2 (A) 10/14/2023   HGBA1C 7.2 (A) 10/14/2023  HGBA1C 8.0 (H) 07/03/2023   HGBA1C 9.1 (H) 03/29/2023   HGBA1C 9.6 (H) 12/26/2022   HGBA1C 7.6 (H) 05/25/2022   HGBA1C 9.1 (H) 01/17/2022   No CKD: Lab Results  Component Value Date   BUN 18 01/24/2024   Lab Results  Component Value Date   CREATININE 0.78 01/24/2024   Lab Results  Component Value Date   MICRALBCREAT Unable to calculate 01/24/2024  On Cozaar  100 mg daily.  + HL: Lab Results  Component Value Date   CHOL 141 01/24/2024   HDL 42.70 01/24/2024   LDLCALC 79 01/24/2024   TRIG 100.0 01/24/2024   CHOLHDL 3 01/24/2024  She is on Crestor  10 mg daily.  Last eye exam: 06/06/2022: No DR-reviewed note: 1. Type 2 diabetes mellitus without complication, without long-term current use of insulin  (HCC) No  retinopathy OU  2. Bilateral dry eyes Excess tearing Will try at's bid to qid OU If no improvement, call back can consider oculoplastic consult  3. Hyperopia with astigmatism and presbyopia, bilateral rx dispensed   Hx of Hypothyroidism following radioiodine therapy   She saw Erin [name] in the past.  She was on high doses on Prednisone  (60 mg) in the past, then decreased.  Now off prednisone .  ROS: Signs see HPI  I reviewed pt's medications, allergies, PMH, social hx, family hx, and changes were documented in the history of present illness. Otherwise, unchanged from my initial visit note.  Past Medical History:  Diagnosis Date   Abnormal uterine bleeding    Anemia    COVID 12/2018   Depression    Diabetes mellitus without complication (HCC)    gestational   Elevated blood-pressure reading without diagnosis of hypertension    Endometriosis    Fibroids    GERD (gastroesophageal reflux disease)    Graves disease    Grief at loss of child    H/O gestational diabetes mellitus, not currently pregnant 09/28/2015   Hypertension    Hypothyroidism    Interstitial cystitis    Menorrhagia    Oropharyngeal candidiasis 01/16/2018   OSA (obstructive sleep apnea), severe 06/23/2018   Premature ventricular contractions    a. rare PVC by monitor 12/2016.   Right ovarian cyst    3 cm.   Thyroid  disease    Uterine leiomyoma    Past Surgical History:  Procedure Laterality Date   APPENDECTOMY     ARTERY BIOPSY Left 10/25/2017   Procedure: BIOPSY TEMPORAL ARTERY;  Surgeon: Erin Anes, MD;  Location: [facility];  Service: General;  Laterality: Left;   CESAREAN SECTION     CHOLECYSTECTOMY     HERNIA REPAIR     incisional   TUMOR REMOVAL  fibroids   Social History   Socioeconomic History   Marital status: Married    Spouse name: Not on file   Number of children: Not on file   Years of education: Not on file   Highest education level: Associate degree: occupational,  Scientist, product/process development, or vocational program  Occupational History   Not on file  Tobacco Use   Smoking status: Never   Smokeless tobacco: Never  Vaping Use   Vaping status: Never Used  Substance and Sexual Activity   Alcohol use: No   Drug use: No   Sexual activity: Yes    Partners: Male    Birth control/protection: Post-menopausal  Other Topics Concern   Not on file  Social History Narrative   Caffeine: maybe 4 cups/day (tea/coffee)   Social Drivers of Health  Financial Resource Strain: Low Risk  (12/10/2023)   Overall Financial Resource Strain (CARDIA)    Difficulty of Paying Living Expenses: Not hard at all  Food Insecurity: Food Insecurity Present (12/10/2023)   Hunger Vital Sign    Worried About Running Out of Food in the Last Year: Sometimes true    Ran Out of Food in the Last Year: Sometimes true  Transportation Needs: No Transportation Needs (12/10/2023)   PRAPARE - Administrator, Civil Service (Medical): No    Lack of Transportation (Non-Medical): No  Physical Activity: Insufficiently Active (12/10/2023)   Exercise Vital Sign    Days of Exercise per Week: 3 days    Minutes of Exercise per Session: 20 min  Stress: No Stress Concern Present (12/10/2023)   [employer] of Occupational Health - Occupational Stress Questionnaire    Feeling of Stress: Not at all  Social Connections: Moderately Integrated (12/10/2023)   Social Connection and Isolation Panel    Frequency of Communication with Friends and Family: Once a week    Frequency of Social Gatherings with Friends and Family: Once a week    Attends Religious Services: More than 4 times per year    Active Member of [employer] or Organizations: Yes    Attends Engineer, structural: More than 4 times per year    Marital Status: Married  Catering manager Violence: Not on file   Current Outpatient Medications on File Prior to Visit  Medication Sig Dispense Refill   ALPRAZolam  (XANAX ) 0.5 MG tablet Take 1  tablet (0.5 mg total) by mouth 2 (two) times daily as needed for anxiety. 60 tablet 0   Blood Glucose Monitoring Suppl (FREESTYLE LITE) w/Device KIT Use as directed 1 kit 0   cholecalciferol (VITAMIN D) 1000 units tablet Take 1,000 Units by mouth daily.     clotrimazole  (LOTRIMIN ) 1 % cream Apply 1 application topically 2 (two) times daily. Use for 2 weeks at a time as needed. 30 g 2   diltiazem  (TIAZAC ) 120 MG 24 hr capsule Take 1 capsule (120 mg total) by mouth daily. 90 capsule 3   docusate sodium  (COLACE) 100 MG capsule Take 1-3 capsules (100-300 mg total) by mouth daily as needed for mild constipation. 90 capsule 5   empagliflozin  (JARDIANCE ) 25 MG TABS tablet Take 1 tablet (25 mg total) by mouth daily before breakfast. 30 tablet 5   estradiol  (ESTRACE  VAGINAL) 0.1 MG/GM vaginal cream Apply 1/2 gram to vulva nightly for 2 weeks then decrease to 1/2 gram to vulva two nights a week. Do not use applicator. 42.5 g 1   famotidine  (PEPCID ) 20 MG tablet Take 1 tablet (20 mg total) by mouth 2 (two) times daily. 180 tablet 3   fluconazole  (DIFLUCAN ) 150 MG tablet Take 1 tablet (150 mg total) by mouth once a week for 24 doses. Start after taking 1 tablet every 3 days for 1 week. 12 tablet 1   glucose blood (FREESTYLE LITE) test strip Use to check blood glucose once daily and if needed. 100 each 12   Lancets (FREESTYLE) lancets Use as instructed to check sugars daily and as needed. 100 each 12   losartan  (COZAAR ) 100 MG tablet Take 1 tablet (100 mg total) by mouth daily. 90 tablet 3   meloxicam  (MOBIC ) 15 MG tablet Take 1 tablet (15 mg total) by mouth daily. 30 tablet 3   methocarbamol  (ROBAXIN ) 750 MG tablet Take 1 tablet (750 mg total) by mouth 4 (four) times daily. 120  tablet 2   Multiple Vitamin (MULTIVITAMIN) tablet Take 1 tablet by mouth daily.     nystatin  (MYCOSTATIN /NYSTOP ) powder Apply 1 Application topically 3 (three) times daily. Apply to affected area for up to 7 days. 30 g 2   ondansetron   (ZOFRAN -ODT) 4 MG disintegrating tablet Take 1 tablet (4 mg total) by mouth every 8 (eight) hours as needed for nausea or vomiting. 20 tablet 0   rosuvastatin  (CRESTOR ) 10 MG tablet Take 1 tablet (10 mg total) by mouth daily. 90 tablet 3   Semaglutide  (RYBELSUS ) 7 MG TABS Take 1 tablet (7 mg total) by mouth daily. 30 tablet 5   UNITHROID  137 MCG tablet Take 1 tablet (137 mcg total) by mouth daily before breakfast. 45 tablet 6   No current facility-administered medications on file prior to visit.   Allergies  Allergen Reactions   Prednisone  Other (See Comments)    Raises blood sugar so high that she receives intensive care   Trulicity  [Dulaglutide ] Diarrhea and Nausea And Vomiting   Fluoxetine     Glimepiride      Stomach upset   Prozac  [Fluoxetine  Hcl] Nausea Only   Hydrocodone Hives, Itching and Rash    On thighs    Metformin  And Related     diarrhea   Family History  Problem Relation Age of Onset   Hypertension Mother    Cancer Mother    Breast cancer Mother 20   Hypertension Father    Diabetes Father    Cancer Father    Ovarian cancer Maternal Aunt    Thyroid  disease Maternal Aunt        hypothyroid   PE: BP 124/80 (BP Location: Left Arm, Patient Position: Sitting, Cuff Size: Large)   Pulse 100   Ht 5' 5 (1.651 m)   Wt (!) 344 lb (156 kg)   LMP  (LMP Unknown)   SpO2 98%   BMI 57.24 kg/m  Wt Readings from Last 3 Encounters:  03/20/24 (!) 344 lb (156 kg)  01/24/24 (!) 346 lb (156.9 kg)  12/18/23 (!) 344 lb (156 kg)   Constitutional: overweight, in NAD Eyes:  EOMI, no exophthalmos ENT: no neck masses, no cervical lymphadenopathy Cardiovascular: tachycardia, R, No MRG Respiratory: CTA B Musculoskeletal: no deformities Skin:no rashes Neurological: no tremor with outstretched hands  ASSESSMENT: 1. Hypothyroidism - After RAI treatment for Graves' disease  2. DM 2/2 steroid use -In the past, she also wanted me to manage this but she was not compliant with  appointments so she continues to see PCP for this problem. -Latest HbA1c was 7.8%, slightly higher. - She had problems tolerating Trulicity  (nausea/vomiting).  Now on Rybelsus . She is also on Farxiga but this causes urinary tract infections.    PLAN:  1. Patient with longstanding, uncontrolled, hypothyroidism, on levothyroxine  therapy -Unithroid  brand name. - latest thyroid  labs reviewed with pt. >> normal: Lab Results  Component Value Date   TSH 0.55 01/24/2024  - she continues on LT4 137 mcg 6/7 days and 66.5 mcg 1/7 days - pt feels good on this dose.  She does have hot flashes and mental fog and feels that these may be related to menopause.  She also has dry eyes and sometimes feels that her eyes are heavy, but no double vision, pain with eye movement, chemosis.  She has an ophthalmologist, whom she sees once a year. - we discussed about taking the thyroid  hormone every day, with water, >30 minutes before breakfast, separated by >4 hours from acid reflux medications,  calcium , iron, multivitamins. Pt. is taking it correctly.  She did miss 1 dose recently, when her son was in the hospital.  We discussed that she can take 2 tablets next day in that case. - will check thyroid  tests today: TSH and fT4 - If labs are abnormal, she will need to return for repeat TFTs in 1.5 months - RTC in 1 year  Needs refills for 90 days - Unithroid  (with [facility] coupon).   Component     Latest Ref Rng 03/20/2024  TSH     0.40 - 4.50 mIU/L 0.25 (L)   T4,Free(Direct)     0.8 - 1.8 ng/dL 1.4   TSH is suppressed.  We need to back off her LT4 dose to 112 mcg daily and recheck her test in 1.5 months.  Lela Fendt, MD PhD [facility]

## 2024-03-24 ENCOUNTER — Ambulatory Visit: Payer: Self-pay | Admitting: Internal Medicine

## 2024-03-24 MED ORDER — UNITHROID 112 MCG PO TABS: 112.0000 ug | ORAL_TABLET | Freq: Every day | ORAL | 3 refills | Status: AC

## 2024-03-24 NOTE — Addendum Note (Signed)
 Addended by: TRIXIE FILE on: 03/24/2024 12:53 PM   Modules accepted: Orders

## 2024-03-30 ENCOUNTER — Other Ambulatory Visit (HOSPITAL_COMMUNITY): Payer: Self-pay

## 2024-04-20 ENCOUNTER — Other Ambulatory Visit (HOSPITAL_COMMUNITY): Payer: Self-pay

## 2024-04-22 ENCOUNTER — Ambulatory Visit: Admitting: Obstetrics and Gynecology

## 2024-05-06 ENCOUNTER — Ambulatory Visit: Admitting: Obstetrics and Gynecology

## 2024-05-11 ENCOUNTER — Other Ambulatory Visit (HOSPITAL_COMMUNITY): Payer: Self-pay

## 2024-05-11 ENCOUNTER — Other Ambulatory Visit: Payer: Self-pay | Admitting: Internal Medicine

## 2024-05-11 ENCOUNTER — Other Ambulatory Visit: Payer: Self-pay

## 2024-05-11 MED ORDER — RYBELSUS 7 MG PO TABS
7.0000 mg | ORAL_TABLET | Freq: Every day | ORAL | 5 refills | Status: AC
  Filled 2024-05-11: qty 30, 30d supply, fill #0
  Filled 2024-06-24: qty 30, 30d supply, fill #1

## 2024-05-14 ENCOUNTER — Other Ambulatory Visit (HOSPITAL_COMMUNITY): Payer: Self-pay

## 2024-05-14 ENCOUNTER — Other Ambulatory Visit: Payer: Self-pay

## 2024-05-15 ENCOUNTER — Other Ambulatory Visit (HOSPITAL_COMMUNITY): Payer: Self-pay

## 2024-05-18 ENCOUNTER — Other Ambulatory Visit (HOSPITAL_COMMUNITY): Payer: Self-pay

## 2024-05-19 DIAGNOSIS — G4731 Primary central sleep apnea: Secondary | ICD-10-CM | POA: Diagnosis not present

## 2024-05-19 DIAGNOSIS — G4733 Obstructive sleep apnea (adult) (pediatric): Secondary | ICD-10-CM | POA: Diagnosis not present

## 2024-06-03 ENCOUNTER — Ambulatory Visit: Admitting: Obstetrics and Gynecology

## 2024-06-17 ENCOUNTER — Encounter (HOSPITAL_COMMUNITY): Payer: Self-pay

## 2024-06-17 ENCOUNTER — Other Ambulatory Visit: Payer: Self-pay

## 2024-06-17 ENCOUNTER — Other Ambulatory Visit (HOSPITAL_COMMUNITY): Payer: Self-pay

## 2024-06-17 ENCOUNTER — Other Ambulatory Visit: Payer: Self-pay | Admitting: Internal Medicine

## 2024-06-19 ENCOUNTER — Other Ambulatory Visit (HOSPITAL_COMMUNITY): Payer: Self-pay

## 2024-06-19 MED ORDER — MELOXICAM 15 MG PO TABS
15.0000 mg | ORAL_TABLET | Freq: Every day | ORAL | 3 refills | Status: AC
  Filled 2024-06-19 – 2024-07-14 (×2): qty 30, 30d supply, fill #0

## 2024-06-26 ENCOUNTER — Other Ambulatory Visit (HOSPITAL_COMMUNITY): Payer: Self-pay

## 2024-07-01 ENCOUNTER — Other Ambulatory Visit (HOSPITAL_COMMUNITY): Payer: Self-pay

## 2024-07-14 ENCOUNTER — Other Ambulatory Visit: Payer: Self-pay

## 2024-07-14 ENCOUNTER — Other Ambulatory Visit (HOSPITAL_COMMUNITY): Payer: Self-pay

## 2024-07-14 ENCOUNTER — Other Ambulatory Visit: Payer: Self-pay | Admitting: Internal Medicine

## 2024-07-14 ENCOUNTER — Encounter: Payer: Self-pay | Admitting: Internal Medicine

## 2024-07-14 MED ORDER — EMPAGLIFLOZIN 25 MG PO TABS
25.0000 mg | ORAL_TABLET | Freq: Every day | ORAL | 5 refills | Status: AC
  Filled 2024-07-14 (×3): qty 30, 30d supply, fill #0

## 2024-07-14 MED ORDER — LOSARTAN POTASSIUM 100 MG PO TABS
100.0000 mg | ORAL_TABLET | Freq: Every day | ORAL | 3 refills | Status: AC
  Filled 2024-07-14: qty 90, 90d supply, fill #0

## 2024-07-23 ENCOUNTER — Other Ambulatory Visit: Payer: Self-pay

## 2024-07-28 ENCOUNTER — Other Ambulatory Visit (HOSPITAL_COMMUNITY): Payer: Self-pay

## 2024-07-28 ENCOUNTER — Other Ambulatory Visit: Payer: Self-pay

## 2024-07-29 ENCOUNTER — Ambulatory Visit: Payer: 59 | Admitting: Obstetrics and Gynecology

## 2024-08-07 ENCOUNTER — Encounter: Admitting: Internal Medicine

## 2024-08-12 ENCOUNTER — Telehealth: Payer: 59 | Admitting: Adult Health

## 2024-08-14 ENCOUNTER — Ambulatory Visit: Admitting: Obstetrics and Gynecology

## 2025-03-26 ENCOUNTER — Ambulatory Visit: Admitting: Internal Medicine
